# Patient Record
Sex: Female | Born: 1945 | Race: White | Hispanic: No | Smoking: Former smoker
Health system: Southern US, Community
[De-identification: ages and names within clinical notes are randomized; demographics above are authoritative.]

## PROBLEM LIST (undated history)

## (undated) DIAGNOSIS — H269 Unspecified cataract: Secondary | ICD-10-CM

## (undated) DIAGNOSIS — M8580 Other specified disorders of bone density and structure, unspecified site: Secondary | ICD-10-CM

## (undated) DIAGNOSIS — N200 Calculus of kidney: Secondary | ICD-10-CM

## (undated) DIAGNOSIS — L309 Dermatitis, unspecified: Secondary | ICD-10-CM

## (undated) DIAGNOSIS — K115 Sialolithiasis: Secondary | ICD-10-CM

## (undated) DIAGNOSIS — C4490 Unspecified malignant neoplasm of skin, unspecified: Secondary | ICD-10-CM

## (undated) DIAGNOSIS — T7840XA Allergy, unspecified, initial encounter: Secondary | ICD-10-CM

## (undated) DIAGNOSIS — M1990 Unspecified osteoarthritis, unspecified site: Secondary | ICD-10-CM

## (undated) DIAGNOSIS — L300 Nummular dermatitis: Secondary | ICD-10-CM

## (undated) DIAGNOSIS — N8110 Cystocele, unspecified: Secondary | ICD-10-CM

## (undated) DIAGNOSIS — J40 Bronchitis, not specified as acute or chronic: Secondary | ICD-10-CM

## (undated) DIAGNOSIS — Z973 Presence of spectacles and contact lenses: Secondary | ICD-10-CM

## (undated) DIAGNOSIS — R7989 Other specified abnormal findings of blood chemistry: Secondary | ICD-10-CM

## (undated) DIAGNOSIS — Z0000 Encounter for general adult medical examination without abnormal findings: Secondary | ICD-10-CM

## (undated) DIAGNOSIS — J449 Chronic obstructive pulmonary disease, unspecified: Secondary | ICD-10-CM

## (undated) DIAGNOSIS — E559 Vitamin D deficiency, unspecified: Secondary | ICD-10-CM

## (undated) DIAGNOSIS — H4010X0 Unspecified open-angle glaucoma, stage unspecified: Secondary | ICD-10-CM

## (undated) DIAGNOSIS — I10 Essential (primary) hypertension: Secondary | ICD-10-CM

## (undated) DIAGNOSIS — Z8619 Personal history of other infectious and parasitic diseases: Secondary | ICD-10-CM

## (undated) DIAGNOSIS — H409 Unspecified glaucoma: Secondary | ICD-10-CM

## (undated) DIAGNOSIS — B958 Unspecified staphylococcus as the cause of diseases classified elsewhere: Secondary | ICD-10-CM

## (undated) DIAGNOSIS — R32 Unspecified urinary incontinence: Secondary | ICD-10-CM

## (undated) DIAGNOSIS — E7800 Pure hypercholesterolemia, unspecified: Secondary | ICD-10-CM

## (undated) HISTORY — DX: Vitamin D deficiency, unspecified: E55.9

## (undated) HISTORY — DX: Sialolithiasis: K11.5

## (undated) HISTORY — DX: Unspecified urinary incontinence: R32

## (undated) HISTORY — DX: Hypercalcemia: E83.52

## (undated) HISTORY — PX: REFRACTIVE SURGERY: SHX103

## (undated) HISTORY — DX: Other specified disorders of bone density and structure, unspecified site: M85.80

## (undated) HISTORY — DX: Encounter for general adult medical examination without abnormal findings: Z00.00

## (undated) HISTORY — DX: Unspecified malignant neoplasm of skin, unspecified: C44.90

## (undated) HISTORY — DX: Unspecified open-angle glaucoma, stage unspecified: H40.10X0

## (undated) HISTORY — DX: Nummular dermatitis: L30.0

## (undated) HISTORY — DX: Other specified abnormal findings of blood chemistry: R79.89

## (undated) HISTORY — DX: Personal history of other infectious and parasitic diseases: Z86.19

## (undated) HISTORY — PX: CATARACT EXTRACTION: SUR2

## (undated) HISTORY — DX: Unspecified staphylococcus as the cause of diseases classified elsewhere: B95.8

## (undated) HISTORY — DX: Cystocele, unspecified: N81.10

## (undated) HISTORY — DX: Unspecified cataract: H26.9

## (undated) HISTORY — DX: Chronic obstructive pulmonary disease, unspecified: J44.9

## (undated) HISTORY — PX: EYE SURGERY: SHX253

## (undated) HISTORY — DX: Unspecified glaucoma: H40.9

## (undated) HISTORY — DX: Allergy, unspecified, initial encounter: T78.40XA

---
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
## 1975-01-24 HISTORY — PX: TUBAL LIGATION: SHX77

## 1998-02-04 ENCOUNTER — Encounter: Payer: Self-pay | Admitting: Internal Medicine

## 2005-10-23 ENCOUNTER — Other Ambulatory Visit: Admission: RE | Admit: 2005-10-23 | Discharge: 2005-10-23 | Payer: Self-pay | Admitting: Family Medicine

## 2005-11-29 ENCOUNTER — Encounter: Admission: RE | Admit: 2005-11-29 | Discharge: 2005-11-29 | Payer: Self-pay | Admitting: Family Medicine

## 2006-02-28 ENCOUNTER — Emergency Department (HOSPITAL_COMMUNITY): Admission: EM | Admit: 2006-02-28 | Discharge: 2006-02-28 | Payer: Self-pay | Admitting: Emergency Medicine

## 2006-12-12 ENCOUNTER — Encounter: Admission: RE | Admit: 2006-12-12 | Discharge: 2006-12-12 | Payer: Self-pay | Admitting: Internal Medicine

## 2007-12-13 ENCOUNTER — Encounter: Admission: RE | Admit: 2007-12-13 | Discharge: 2007-12-13 | Payer: Self-pay | Admitting: Internal Medicine

## 2008-03-02 ENCOUNTER — Encounter: Payer: Self-pay | Admitting: Internal Medicine

## 2008-03-19 ENCOUNTER — Encounter: Payer: Self-pay | Admitting: Internal Medicine

## 2008-05-07 DIAGNOSIS — I10 Essential (primary) hypertension: Secondary | ICD-10-CM | POA: Insufficient documentation

## 2008-05-07 DIAGNOSIS — M810 Age-related osteoporosis without current pathological fracture: Secondary | ICD-10-CM | POA: Insufficient documentation

## 2008-05-07 DIAGNOSIS — E559 Vitamin D deficiency, unspecified: Secondary | ICD-10-CM | POA: Insufficient documentation

## 2008-05-11 ENCOUNTER — Ambulatory Visit: Payer: Self-pay | Admitting: Internal Medicine

## 2008-05-11 DIAGNOSIS — K5900 Constipation, unspecified: Secondary | ICD-10-CM | POA: Insufficient documentation

## 2008-06-26 ENCOUNTER — Ambulatory Visit: Payer: Self-pay | Admitting: Internal Medicine

## 2008-12-14 ENCOUNTER — Encounter: Admission: RE | Admit: 2008-12-14 | Discharge: 2008-12-14 | Payer: Self-pay | Admitting: Internal Medicine

## 2010-01-18 ENCOUNTER — Encounter
Admission: RE | Admit: 2010-01-18 | Discharge: 2010-01-18 | Payer: Self-pay | Source: Home / Self Care | Attending: Internal Medicine | Admitting: Internal Medicine

## 2010-02-22 NOTE — Assessment & Plan Note (Signed)
Summary: SCREEN FOR COLON/YF   History of Present Illness Visit Type: Initial Consult Primary GI MD: Lina Sar MD Primary Provider: Buren Kos, MD Requesting Provider: Buren Kos, MD Chief Complaint: consult colon, pt has them every 5 years b/c of family hx of colon cancer History of Present Illness:   This is a 65 year old white female here to discuss having a screening colonoscopy. She has a history of colon polyps and a family history of colon cancer in her mother who was diagnosed at the age of 28. Mrs. Chilton Si had 2 prior colonoscopies in 06-24-98 and 2003/06/24 in [place], [place] by a gastroenterologist. She did not have any sedation for either of the procedures per her request. We do not have those reports but we have requested them. This year would be 5 years since her last colonoscopy. The only symptom she has is constipation. Patient takes MiraLax p.r.n. and stays on a high fiber diet. She denies any rectal bleeding or abdominal pain. Her weight has been stable. Additional medical problems include high blood pressure, osteopenia and vitamin D deficiency. She also is an ex-smoker.   GI Review of Systems      Denies abdominal pain, acid reflux, belching, bloating, chest pain, dysphagia with liquids, dysphagia with solids, heartburn, loss of appetite, nausea, vomiting, vomiting blood, weight loss, and  weight gain.        Denies anal fissure, black tarry stools, change in bowel habit, constipation, diarrhea, diverticulosis, fecal incontinence, heme positive stool, hemorrhoids, irritable bowel syndrome, jaundice, light color stool, liver problems, rectal bleeding, and  rectal pain.    Current Medications (verified): 1)  Magic Mouthwash .... Swish and Swallow Four Times A Day 2)  Lisinopril 20 Mg Tabs (Lisinopril) .... One Tablet By Mouth Once Daily 3)  Calcium 600 1500 Mg Tabs (Calcium Carbonate) .... 2 Tablets By Mouth Once Daily 4)  Vitamin D 1000 Unit Caps (Cholecalciferol) ....  One Tablet By Mouth Once Daily 5)  Multivitamins   Tabs (Multiple Vitamin) .... One Tablet By Mouth Once Daily 6)  Aspirin 81 Mg  Tabs (Aspirin) .... One Tablet By Mouth Once Daily  Allergies (verified): 1)  ! Penicillin 2)  ! Sulfa 3)  ! Steroids  Past History:  Past Medical History:    Reviewed history from 05/07/2008 and no changes required:    Current Problems:     VITAMIN D DEFICIENCY (ICD-268.9)    OSTEOPENIA (ICD-733.90)    HYPERTENSION (ICD-401.9)  Past Surgical History:    Unremarkable  Family History:    Family History of Colon Cancer:Mother died in 1999-06-24.  Social History:    Retired    Patient currently smokes.     Illicit Drug Use - no    Alcohol Use - no    Daily Caffeine Use  Review of Systems  The patient denies allergy/sinus, anemia, anxiety-new, arthritis/joint pain, back pain, blood in urine, breast changes/lumps, change in vision, confusion, cough, coughing up blood, depression-new, fainting, fatigue, fever, headaches-new, hearing problems, heart murmur, heart rhythm changes, itching, menstrual pain, muscle pains/cramps, night sweats, nosebleeds, pregnancy symptoms, shortness of breath, skin rash, sleeping problems, sore throat, swelling of feet/legs, swollen lymph glands, thirst - excessive , urination - excessive , urination changes/pain, urine leakage, vision changes, and voice change.    Vital Signs:  Patient profile:   65 year old female Height:      67 inches Weight:      136 pounds BMI:     21.38 Pulse rate:  80 / minute Pulse rhythm:   regular BP sitting:   144 / 82  (right arm) Cuff size:   regular  Vitals Entered By: Christie Nottingham CMA (May 11, 2008 10:55 AM)  Physical Exam  General:  alert, oriented and very cooperative. Appeared healthy for her age. Neck:  Supple; no masses or thyromegaly. Lungs:  Clear throughout to auscultation. Heart:  Regular rate and rhythm; no murmurs, rubs or bruits. Abdomen:  soft scaphoid abdomen with  normoactive bowel sounds. Stretch marks. Liver edge at costal margin. No distention. Rectal:  deferred. Extremities:  No clubbing, cyanosis, edema or deformities noted. Neurologic:  Alert and  oriented x4;  grossly normal neurologically.   Impression & Recommendations:  Problem # 1:  ADENOCARCINOMA, COLON, FAMILY HX (ICD-V16.0)  family history colon cancer in patient's mother. 2 prior colonoscopies done in 2000 and 2005 showed small colon polyps. We have requested reports from [place], [place]. Patient is due to have another colonoscopy as per guidelines of the American Cancer Society. We will set up exam without conscious sedation per her request. I have discussed with the patient pros and cons of conscious sedation and she insists on not  needing any sedation for the procedure.  Orders: Colonoscopy (Colon)  Problem # 2:  CONSTIPATION (ICD-564.00)  chronic constipation. Patient is using MiraLax intermittently. She should continue a high-fiber diet.  Orders: Colonoscopy (Colon)  Patient Instructions: 1)  schedule surveillance colonoscopy. 2)  Obtain reports from [place], [place] 3)  High-fiber diet. 4)  Copy sent to : Dr D.Clelia Croft Prescriptions: DULCOLAX 5 MG  TBEC (BISACODYL) Day before procedure take 2 at 3pm and 2 at 8pm.  #4 x 0   Entered by:   Hortense Ramal CMA   Authorized by:   Hart Carwin   Signed by:   Hortense Ramal CMA on 05/11/2008   Method used:   Electronically to        [facility] (retail)       [address]       [place], [place]  [address]       Ph: [phone]       Fax: [phone]   RxID:   [id number] REGLAN 10 MG  TABS (METOCLOPRAMIDE HCL) As per prep instructions.  #2 x 0   Entered by:   Hortense Ramal CMA   Authorized by:   Hart Carwin   Signed by:   Hortense Ramal CMA on 05/11/2008   Method used:   Electronically to        [facility] ([facility])       [address]        [place], [place]  [address]       Ph: [phone]       Fax: [phone]   RxID:   [id number] MIRALAX   POWD (POLYETHYLENE GLYCOL 3350) As per prep  instructions.  #255gm x 0   Entered by:   Hortense Ramal CMA   Authorized by:   Hart Carwin   Signed by:   Hortense Ramal CMA on 05/11/2008   Method used:   Electronically to        [facility]       [address]       [place], [place]  [address]       Ph:  [phone]       Fax: [phone]   RxID:   [id number]   Appended Document: SCREEN FOR COLON/YF I have received a colonoscopy report from Dr Kathie Rhodes.Sherryll Burger from [facility] ,[place] , [place], 1/13/200, which shows  normal exam to TI, except hemorrhoids.I left a messsage on pt's phone about receiving the records

## 2010-02-22 NOTE — Procedures (Signed)
Summary: [facility], [place], [place]  Colonoscopy/[facility], [place], [place]   Imported By: Lanelle Bal 06/05/2008 13:46:46  _____________________________________________________________________  External Attachment:    Type:   Image     Comment:   External Document

## 2010-02-22 NOTE — Procedures (Signed)
Summary: Colonoscopy   Colonoscopy  Procedure date:  06/26/2008  Findings:      Location:  [facility].    Procedures Next Due Date:    Colonoscopy: 06/2013  COLONOSCOPY PROCEDURE REPORT  PATIENT:  Suzanne Turner, Suzanne Turner  MR#:  [id number] BIRTHDATE:   01-03-1946, 63 yrs. old   GENDER:   female  ENDOSCOPIST:   Hedwig Morton. Juanda Chance, MD Referred by: Kari Baars, M.D.  PROCEDURE DATE:  06/26/2008 PROCEDURE:  Surveillance Colonoscopy ASA CLASS:   Class I INDICATIONS: family history of colon cancer colon cancer in pt's mother  colonoscopy in 2000 and 2005 in [place] without sedation  polyps on last colonoscopy  constipation  MEDICATIONS:    none  DESCRIPTION OF PROCEDURE:   After the risks benefits and alternatives of the procedure were thoroughly explained, informed consent was obtained.  Digital rectal exam was performed and revealed no rectal masses.   The LB [id number]-[id number] endoscope was introduced through the anus and advanced to the cecum, which was identified by both the appendix and ileocecal valve, without limitations.  The quality of the prep was good, using MiraLax.  The instrument was then slowly withdrawn as the colon was fully examined. <<PROCEDUREIMAGES>>        <<OLD IMAGES>>  FINDINGS:  normal colon otherwise (see image1, image2, image3, and image4).  No polyps or cancers were seen.   Retroflexed views in the rectum revealed no abnormalities.    The scope was then withdrawn from the patient and the procedure completed.  COMPLICATIONS:   None  ENDOSCOPIC IMPRESSION:  1) Normal  2) No polyps or cancers RECOMMENDATIONS:  1) high fiber diet  REPEAT EXAM:   In 5 year(s) for.   _______________________________ Hedwig Morton. Juanda Chance, MD  cc: Buren Kos, MD

## 2010-12-19 ENCOUNTER — Other Ambulatory Visit: Payer: Self-pay | Admitting: Internal Medicine

## 2010-12-19 DIAGNOSIS — Z1231 Encounter for screening mammogram for malignant neoplasm of breast: Secondary | ICD-10-CM

## 2011-01-12 ENCOUNTER — Emergency Department (HOSPITAL_COMMUNITY)
Admission: EM | Admit: 2011-01-12 | Discharge: 2011-01-13 | Disposition: A | Discharge status: Home / Self Care | Payer: Medicare Other | Attending: Emergency Medicine | Admitting: Emergency Medicine

## 2011-01-12 ENCOUNTER — Other Ambulatory Visit: Payer: Self-pay

## 2011-01-12 DIAGNOSIS — E7800 Pure hypercholesterolemia, unspecified: Secondary | ICD-10-CM | POA: Insufficient documentation

## 2011-01-12 DIAGNOSIS — Z7982 Long term (current) use of aspirin: Secondary | ICD-10-CM | POA: Insufficient documentation

## 2011-01-12 DIAGNOSIS — E876 Hypokalemia: Secondary | ICD-10-CM | POA: Insufficient documentation

## 2011-01-12 DIAGNOSIS — F17200 Nicotine dependence, unspecified, uncomplicated: Secondary | ICD-10-CM | POA: Insufficient documentation

## 2011-01-12 DIAGNOSIS — R0602 Shortness of breath: Secondary | ICD-10-CM | POA: Insufficient documentation

## 2011-01-12 DIAGNOSIS — Z79899 Other long term (current) drug therapy: Secondary | ICD-10-CM | POA: Insufficient documentation

## 2011-01-12 DIAGNOSIS — R002 Palpitations: Secondary | ICD-10-CM

## 2011-01-12 DIAGNOSIS — I10 Essential (primary) hypertension: Secondary | ICD-10-CM | POA: Insufficient documentation

## 2011-01-12 DIAGNOSIS — M1990 Unspecified osteoarthritis, unspecified site: Secondary | ICD-10-CM | POA: Insufficient documentation

## 2011-01-12 HISTORY — DX: Essential (primary) hypertension: I10

## 2011-01-12 HISTORY — DX: Unspecified osteoarthritis, unspecified site: M19.90

## 2011-01-12 HISTORY — DX: Bronchitis, not specified as acute or chronic: J40

## 2011-01-12 HISTORY — DX: Calculus of kidney: N20.0

## 2011-01-12 HISTORY — DX: Pure hypercholesterolemia, unspecified: E78.00

## 2011-01-12 LAB — POCT I-STAT, CHEM 8
BUN: 11 mg/dL (ref 6–23)
Calcium, Ion: 1.17 mmol/L (ref 1.12–1.32)
Chloride: 106 mEq/L (ref 96–112)
Creatinine, Ser: 0.8 mg/dL (ref 0.50–1.10)
Glucose, Bld: 147 mg/dL — ABNORMAL HIGH (ref 70–99)
HCT: 40 % (ref 36.0–46.0)
Hemoglobin: 13.6 g/dL (ref 12.0–15.0)
Potassium: 2.8 mEq/L — ABNORMAL LOW (ref 3.5–5.1)
Sodium: 143 mEq/L (ref 135–145)
TCO2: 24 mmol/L (ref 0–100)

## 2011-01-12 NOTE — ED Notes (Signed)
Pt c/o elevated high bp and anxiety x2 days

## 2011-01-13 ENCOUNTER — Other Ambulatory Visit: Payer: Self-pay

## 2011-01-13 ENCOUNTER — Encounter (HOSPITAL_COMMUNITY): Payer: Self-pay

## 2011-01-13 LAB — POCT I-STAT TROPONIN I
Troponin i, poc: 0 ng/mL (ref 0.00–0.08)
Troponin i, poc: 0 ng/mL (ref 0.00–0.08)

## 2011-01-13 MED ORDER — POTASSIUM CHLORIDE CRYS ER 20 MEQ PO TBCR
40.0000 meq | EXTENDED_RELEASE_TABLET | Freq: Once | ORAL | Status: AC
  Administered 2011-01-13: 40 meq via ORAL
  Filled 2011-01-13: qty 2

## 2011-01-13 MED ORDER — POTASSIUM CHLORIDE 10 MEQ/100ML IV SOLN
10.0000 meq | Freq: Once | INTRAVENOUS | Status: AC
  Administered 2011-01-13: 10 meq via INTRAVENOUS
  Filled 2011-01-13: qty 100

## 2011-01-13 MED ORDER — SODIUM CHLORIDE 0.9 % IV SOLN
Freq: Once | INTRAVENOUS | Status: AC
  Administered 2011-01-13: 500 mL via INTRAVENOUS

## 2011-01-13 NOTE — ED Provider Notes (Signed)
History     CSN: [id number]  Arrival date & time 01/12/11  2228   First MD Initiated Contact with Patient 01/13/11 0051     Chief Complaint  Patient presents with  . Anxiety  . Hypertension     Patient is a 65 y.o. female presenting with palpitations.  Palpitations  This is a new problem. The current episode started 6 to 12 hours ago. The problem occurs constantly. The problem has been rapidly improving. The problem is associated with an unknown factor. Episode Length: several hours. Associated symptoms include shortness of breath. Pertinent negatives include no diaphoresis, no fever, no chest pain, no chest pressure, no exertional chest pressure, no near-syncope, no syncope, no nausea and no vomiting.  Pt reports that earlier tonight, she felt her heart racing, she felt her that her BP was elevated.  She denies syncope/cp.  This lasted for several hrs.  She also admits to feeling anxious.  She now feels improved   She reports mild SOB No diaphoresis No new meds No increase in caffeine She is a smoker Denies h/o CAD/CVA No vomiting reported No focal weakness No HA No vision changes  Past Medical History  Diagnosis Date  . Hypertension   . High cholesterol   . Osteoarthritis   . Kidney stone   . Bronchitis     Past Surgical History  Procedure Date  . Tubal ligation     History reviewed. No pertinent family history.  History  Substance Use Topics  . Smoking status: Current Everyday Smoker -- 0.5 packs/day  . Smokeless tobacco: Not on file  . Alcohol Use: No    OB History    Grav Para Term Preterm Abortions TAB SAB Ect Mult Living                  Review of Systems  Constitutional: Negative for fever and diaphoresis.  Respiratory: Positive for shortness of breath.   Cardiovascular: Positive for palpitations. Negative for chest pain, syncope and near-syncope.  Gastrointestinal: Negative for nausea and vomiting.  All other systems reviewed and are  negative.    Allergies  Medrol; Penicillins; and Sulfonamide derivatives  Home Medications   Current Outpatient Rx  Name Route Sig Dispense Refill  . CALCIUM CARBONATE 1250 MG PO TABS Oral Take 1 tablet by mouth daily.      Marland Kitchen VITAMIN D 1000 UNITS PO TABS Oral Take 1,000 Units by mouth daily.      Marland Kitchen LISINOPRIL 20 MG PO TABS Oral Take 20 mg by mouth daily.      Marland Kitchen MELATONIN 5 MG PO TABS Oral Take 1 tablet by mouth at bedtime.      . ASPIRIN EC 81 MG PO TBEC Oral Take 81 mg by mouth daily.        BP 180/100  Pulse 105  Temp(Src) 97.8 F (36.6 C) (Oral)  Resp 16  SpO2 100%  Physical Exam  CONSTITUTIONAL: Well developed/well nourished HEAD AND FACE: Normocephalic/atraumatic EYES: EOMI/PERRL ENMT: Mucous membranes moist NECK: supple no meningeal signs SPINE:entire spine nontender CV: S1/S2 noted, no murmurs/rubs/gallops noted LUNGS: Lungs are clear to auscultation bilaterally, no apparent distress ABDOMEN: soft, nontender, no rebound or guarding GU:no cva tenderness NEURO: Pt is awake/alert, moves all extremitiesx4 No arm drift is noted EXTREMITIES: pulses normal, full ROM SKIN: warm, color normal PSYCH: no abnormalities of mood noted   ED Course  Procedures   Labs Reviewed  POCT I-STAT, CHEM 8 - Abnormal; Notable for the following:  Potassium 2.8 (*)    Glucose, Bld 147 (*)    All other components within normal limits  POCT I-STAT TROPONIN I  I-STAT, CHEM 8  I-STAT TROPONIN I  I-STAT TROPONIN I   2:24 AM Pt improved No CP reported Will check two cardiac markers but my suspicion for ACS is low . Doubt PE/Dissection Noted to be hypokalemic, which could contribute to these symptoms    MDM  Nursing notes reviewed and considered in documentation All labs/vitals reviewed and considered      Date: 01/13/2011  Rate: 85  Rhythm: normal sinus rhythm  QRS Axis: normal  Intervals: normal  ST/T Wave abnormalities: nonspecific ST changes  Conduction  Disutrbances:none  Narrative Interpretation:   Old EKG Reviewed: none available    Joya Gaskins, MD 01/13/11 0730

## 2011-01-13 NOTE — ED Notes (Signed)
Pt ambulated to bathroom independently. Pt did not complain of any dizziness, sob, or rapid heart rate.

## 2011-01-13 NOTE — ED Notes (Signed)
Pt c/o heart palpitations, sob, and elevated bp. Pt has been taking Lisinopril 10 mg daily x2 yrs, pcp increased pts Lisinopril to 20 mg daily 3 wks ago, pt sts "i haven't felt right since they changed my medication." NAD, pt placed on monitor by Joy NT, will continue to observe

## 2011-01-13 NOTE — ED Notes (Signed)
Delphina Cahill pt's sister [phone] (home) [phone] (cell)

## 2011-01-20 ENCOUNTER — Institutional Professional Consult (permissible substitution): Payer: Self-pay | Admitting: Cardiology

## 2011-01-26 ENCOUNTER — Ambulatory Visit
Admission: RE | Admit: 2011-01-26 | Discharge: 2011-01-26 | Disposition: A | Discharge status: Home / Self Care | Payer: Medicare Other | Source: Ambulatory Visit | Attending: Internal Medicine | Admitting: Internal Medicine

## 2011-01-26 DIAGNOSIS — Z1231 Encounter for screening mammogram for malignant neoplasm of breast: Secondary | ICD-10-CM | POA: Diagnosis not present

## 2011-01-31 ENCOUNTER — Encounter (HOSPITAL_COMMUNITY): Payer: Self-pay | Admitting: Emergency Medicine

## 2011-01-31 ENCOUNTER — Emergency Department (HOSPITAL_COMMUNITY): Payer: Medicare Other

## 2011-01-31 ENCOUNTER — Observation Stay (HOSPITAL_COMMUNITY)
Admission: EM | Admit: 2011-01-31 | Discharge: 2011-02-01 | Disposition: A | Discharge status: Home / Self Care | Payer: Medicare Other | Attending: Emergency Medicine | Admitting: Emergency Medicine

## 2011-01-31 DIAGNOSIS — R079 Chest pain, unspecified: Secondary | ICD-10-CM | POA: Diagnosis not present

## 2011-01-31 DIAGNOSIS — I6789 Other cerebrovascular disease: Secondary | ICD-10-CM | POA: Diagnosis not present

## 2011-01-31 DIAGNOSIS — I10 Essential (primary) hypertension: Secondary | ICD-10-CM | POA: Insufficient documentation

## 2011-01-31 DIAGNOSIS — R209 Unspecified disturbances of skin sensation: Secondary | ICD-10-CM | POA: Diagnosis not present

## 2011-01-31 DIAGNOSIS — J984 Other disorders of lung: Secondary | ICD-10-CM | POA: Diagnosis not present

## 2011-01-31 LAB — CBC
HCT: 36.3 % (ref 36.0–46.0)
Hemoglobin: 12.4 g/dL (ref 12.0–15.0)
MCH: 31.2 pg (ref 26.0–34.0)
MCHC: 34.2 g/dL (ref 30.0–36.0)
MCV: 91.2 fL (ref 78.0–100.0)
Platelets: 198 10*3/uL (ref 150–400)
RBC: 3.98 MIL/uL (ref 3.87–5.11)
RDW: 13.1 % (ref 11.5–15.5)
WBC: 5.1 10*3/uL (ref 4.0–10.5)

## 2011-01-31 LAB — BASIC METABOLIC PANEL
BUN: 14 mg/dL (ref 6–23)
CO2: 25 mEq/L (ref 19–32)
Calcium: 10.3 mg/dL (ref 8.4–10.5)
Chloride: 101 mEq/L (ref 96–112)
Creatinine, Ser: 0.71 mg/dL (ref 0.50–1.10)
GFR calc Af Amer: >90 mL/min (ref >90)
GFR calc non Af Amer: 89 mL/min — ABNORMAL LOW (ref >90)
Glucose, Bld: 93 mg/dL (ref 70–99)
Potassium: 3.6 mEq/L (ref 3.5–5.1)
Sodium: 137 mEq/L (ref 135–145)

## 2011-01-31 LAB — TROPONIN I
Troponin I: <0.3 ng/mL (ref <0.30)
Troponin I: <0.3 ng/mL (ref <0.30)

## 2011-01-31 LAB — POCT I-STAT TROPONIN I: Troponin i, poc: 0 ng/mL (ref 0.00–0.08)

## 2011-01-31 MED ORDER — LORAZEPAM 1 MG PO TABS
1.0000 mg | ORAL_TABLET | Freq: Every day | ORAL | Status: DC
  Administered 2011-01-31: 1 mg via ORAL
  Filled 2011-01-31: qty 1

## 2011-01-31 NOTE — ED Notes (Signed)
Pt complained of increased palpations and lt arm numbness. HR was in 90s. EDP made aware. No new orders given.

## 2011-01-31 NOTE — ED Provider Notes (Signed)
6:27 PM Patient in CDU under chest pain protocol.  Patient with several week history of palpitations and poorly controlled hypertension reports to ED today for evaluation of chest pain.  Patient currently chest pain free.  12 lead without indication of ischemia.  Initial cardiac marker negative.  Monitor reveals NSR with ectopy.  S1/S2, RRR, no murmur.  Lungs CTA bilaterally.  Abdomen soft, bowel sounds present.  Strong distal pulses palpated all extremities.  Treatment and diagnostic plan discussed with patient.  Patient scheduled for echo stress test in AM. 7:11 PM  Date: 01/31/2011  Rate:65  Rhythm: normal sinus rhythm  QRS Axis: normal  Intervals: normal  ST/T Wave abnormalities: normal  Conduction Disutrbances:none  Narrative Interpretation: NSR without ectopy  Old EKG Reviewed: unchanged  11:58 PM Patient signed out to Dr. Radford Pax.  Jimmye Norman, NP 01/31/11 [phone]

## 2011-01-31 NOTE — ED Notes (Signed)
Pt stated that she has been having increased lt arm numbness starting today. She also stated that she has been having intermit

## 2011-01-31 NOTE — ED Notes (Signed)
Gave Report to M[place]hon. Pt moved to CDU.

## 2011-01-31 NOTE — ED Notes (Signed)
Pt to cdu for chest pain protocol. Pt to have stress test in am. C/o intermittent chest and back pain with left arm numbness since December. Pt seen at [facility] ed recently for palpitations. Pt has appt with dr Shirlee Latch this month for eval. Pt denies sx right now. Appears anxious, will cont to monitor.

## 2011-01-31 NOTE — ED Notes (Signed)
Per EMS: pt from home c/o left arm numbness with some intermittent chest and back pain; pt sts left arm numbness new today; pt given ASA and nitro in route without effect; pt anxious at present

## 2011-01-31 NOTE — ED Notes (Signed)
Pt stated that she has been having intermittent left arm numbness starting today. She is also complaining of intermittent palpations x 2 weeks. She is currently not having any chest pain. She is not in any respiratory distress. She does complain of dizziness with the palpations. Heart sounds WNL. Will continue to monitor.

## 2011-02-01 DIAGNOSIS — R072 Precordial pain: Secondary | ICD-10-CM | POA: Diagnosis not present

## 2011-02-01 NOTE — ED Provider Notes (Signed)
I saw and evaluated the patient, reviewed the resident's note and I agree with the findings and plan.   Arihana Ambrocio A. Tajia Szeliga, MD 02/01/11 1453 

## 2011-02-01 NOTE — ED Notes (Signed)
Awaiting test results. No voiced complaints presently. NAD. Remains NSR on monitor

## 2011-02-01 NOTE — Progress Notes (Signed)
Observation review complete. 

## 2011-02-01 NOTE — ED Notes (Signed)
Returned from stress echo. Tolerated well. Denies CP, SOB. NAD. Informed patient and/or family of status. Awaiting test results.

## 2011-02-01 NOTE — ED Notes (Signed)
Transported to [facility] for stress test via w/c. Remains pain free

## 2011-02-01 NOTE — ED Notes (Signed)
Happy meal given. No voiced complaints presently. NAD.

## 2011-02-01 NOTE — ED Notes (Signed)
Report received, assumed care. Pt ambulatory to bathroom without assistance

## 2011-02-01 NOTE — ED Provider Notes (Cosign Needed)
11:54 AM I received a call from cardiology.  Stress echo is normal.   Alerted Dr Ignacia Palma to result as he is taking care of patient.   Dillard Cannon [place], [place] 02/01/11 1217

## 2011-02-01 NOTE — ED Provider Notes (Cosign Needed)
History     CSN: [id number]  Arrival date & time 01/31/11  1508   First MD Initiated Contact with Patient 01/31/11 [phone]      Chief Complaint  Patient presents with  . Numbness    (Consider location/radiation/quality/duration/timing/severity/associated sxs/prior treatment) HPI Comments: Pt is a 66 year old woman placed on chest pain protocol last night.  She has had negative EKG and 3 sets of cardiac markers.  She is awaiting stress ECHO.  She has an appointment to be seen by Marca Ancona, M.D., of [facility], on January 28, and she hopes to have this appointment moved up.   Patient is a 66 y.o. female presenting with chest pain. The history is provided by the nursing home and the patient. No language interpreter was used.  Chest Pain     Past Medical History  Diagnosis Date  . Hypertension   . High cholesterol   . Osteoarthritis   . Kidney stone   . Bronchitis     Past Surgical History  Procedure Date  . Tubal ligation     History reviewed. No pertinent family history.  History  Substance Use Topics  . Smoking status: Current Everyday Smoker -- 0.5 packs/day  . Smokeless tobacco: Not on file  . Alcohol Use: No    OB History    Grav Para Term Preterm Abortions TAB SAB Ect Mult Living                  Review of Systems  Cardiovascular: Positive for chest pain.  All other systems reviewed and are negative.    Allergies  Medrol; Penicillins; and Sulfonamide derivatives  Home Medications   Current Outpatient Rx  Name Route Sig Dispense Refill  . ASPIRIN EC 81 MG PO TBEC Oral Take 81 mg by mouth daily.      Marland Kitchen CALCIUM CARBONATE 1250 MG PO TABS Oral Take 1 tablet by mouth daily.      Marland Kitchen VITAMIN D 1000 UNITS PO TABS Oral Take 1,000 Units by mouth daily.      Marland Kitchen LISINOPRIL 20 MG PO TABS Oral Take 20 mg by mouth daily.      Marland Kitchen MELATONIN 5 MG PO TABS Oral Take 1 tablet by mouth at bedtime.        BP 130/78  Pulse 88  Temp(Src) 97.8 F (36.6 C)  (Oral)  Resp 20  SpO2 97%  Physical Exam  Constitutional: She is oriented to person, place, and time.       Slender elderly woman in no distress.  HENT:  Head: Normocephalic and atraumatic.  Eyes: EOM are normal.  Neck: Normal range of motion. Neck supple.  Cardiovascular: Normal rate, regular rhythm and normal heart sounds.   Pulmonary/Chest: Effort normal and breath sounds normal.  Abdominal: Soft. There is no tenderness.  Musculoskeletal: Normal range of motion.  Neurological: She is alert and oriented to person, place, and time.       No sensory or motor deficit.  Skin: Skin is warm and dry.  Psychiatric: She has a normal mood and affect. Her behavior is normal.    ED Course  Procedures (including critical care time)  Labs Reviewed  BASIC METABOLIC PANEL - Abnormal; Notable for the following:    GFR calc non Af Amer 89 (*)    All other components within normal limits  CBC  POCT I-STAT TROPONIN I  TROPONIN I  TROPONIN I  I-STAT TROPONIN I   Dg Chest 2 View  01/31/2011  *RADIOLOGY REPORT*  Clinical Data: Chest pain.  Numbness left arm  CHEST - 2 VIEW  Comparison: 02/28/2006  Findings: Heart size is normal.  Vascularity is normal.  Negative for pneumonia or effusion.  COPD with hyperinflation.  Apical pleural scarring bilaterally.  IMPRESSION: No acute cardiopulmonary disease.  Original Report Authenticated By: Camelia Phenes, M.D.   Results for orders placed during the hospital encounter of 01/31/11  CBC      Component Value Range   WBC 5.1  4.0 - 10.5 (K/uL)   RBC 3.98  3.87 - 5.11 (MIL/uL)   Hemoglobin 12.4  12.0 - 15.0 (g/dL)   HCT 96.0  45.4 - 09.8 (%)   MCV 91.2  78.0 - 100.0 (fL)   MCH 31.2  26.0 - 34.0 (pg)   MCHC 34.2  30.0 - 36.0 (g/dL)   RDW 11.9  14.7 - 82.9 (%)   Platelets 198  150 - 400 (K/uL)  BASIC METABOLIC PANEL      Component Value Range   Sodium 137  135 - 145 (mEq/L)   Potassium 3.6  3.5 - 5.1 (mEq/L)   Chloride 101  96 - 112 (mEq/L)   CO2 25   19 - 32 (mEq/L)   Glucose, Bld 93  70 - 99 (mg/dL)   BUN 14  6 - 23 (mg/dL)   Creatinine, Ser 5.62  0.50 - 1.10 (mg/dL)   Calcium 13.0  8.4 - 10.5 (mg/dL)   GFR calc non Af Amer 89 (*) >90 (mL/min)   GFR calc Af Amer >90  >90 (mL/min)  POCT I-STAT TROPONIN I      Component Value Range   Troponin i, poc 0.00  0.00 - 0.08 (ng/mL)   Comment 3           TROPONIN I      Component Value Range   Troponin I <0.30  <0.30 (ng/mL)  TROPONIN I      Component Value Range   Troponin I <0.30  <0.30 (ng/mL)   Dg Chest 2 View  01/31/2011  *RADIOLOGY REPORT*  Clinical Data: Chest pain.  Numbness left arm  CHEST - 2 VIEW  Comparison: 02/28/2006  Findings: Heart size is normal.  Vascularity is normal.  Negative for pneumonia or effusion.  COPD with hyperinflation.  Apical pleural scarring bilaterally.  IMPRESSION: No acute cardiopulmonary disease.  Original Report Authenticated By: Camelia Phenes, M.D.   Mm Digital Screening  01/27/2011  DG SCREEN MAMMOGRAM BILATERAL Bilateral CC and MLO view(s) were taken.  DIGITAL SCREENING MAMMOGRAM WITH CAD: The breast tissue is almost entirely fatty.  No masses or malignant type calcifications are  identified.  Compared with prior studies.  Images were processed with CAD.  IMPRESSION: No specific mammographic evidence of malignancy.  Next screening mammogram is recommended in one  year.  A result letter of this screening mammogram will be mailed directly to the patient.  ASSESSMENT: Negative - BI-RADS 1  Screening mammogram in 1 year. ,      12:40 PM Pt has returned from having stress ECHO, report pending.  She is currently asymptomatic.     12:40 PM Call to [facility] to get stress ECHO read.   12:40 PM Pt's stress ECHO was negative.  Call to [facility].  [facility] will contact pt with an appointment for followup, sooner than her current appointment of February 20, 2011.    1. Chest pain  Carleene Cooper III, MD 02/01/11 1239  Carleene Cooper III, MD 02/01/11 1239  Carleene Cooper III, MD 02/01/11 2252

## 2011-02-01 NOTE — Progress Notes (Signed)
*  PRELIMINARY RESULTS* Echocardiogram Echocardiogram Stress Test has been performed.  Clide Deutscher RDCS 02/01/2011, 11:52 AM

## 2011-02-03 ENCOUNTER — Telehealth: Payer: Self-pay | Admitting: Cardiology

## 2011-02-03 NOTE — Telephone Encounter (Signed)
Pt requesting sooner appt than 02-20-11, not having any problems right now, pls call

## 2011-02-03 NOTE — Telephone Encounter (Addendum)
Pt been seen in er/see chart, no current sob/cp/palpitations/not taking bp/p at home. Wants to be seen sooner due to prior x2 palpitations/ new onset, ED stress Myoview=normal function. Cardiac enzymes normal.  BP/p ok in er, This app is an est app/ no available app sooner. Explained to pt to see PCP in mean time, she doesn't want to go back to PCP Dr Clelia Croft stating they don't always return call.   Told her I would update Dr Alford Highland nurse and if appropriate will set earlier app. Pt sounds anxious on phone. Advised reducing caffeine follow with PCP or er. Pt accepting of plan.

## 2011-02-06 NOTE — Telephone Encounter (Signed)
I talked with pt 02/03/11. I have given pt an appt with Dr Shirlee Latch 02/07/11 8am.

## 2011-02-07 ENCOUNTER — Ambulatory Visit (INDEPENDENT_AMBULATORY_CARE_PROVIDER_SITE_OTHER): Payer: Medicare Other | Admitting: Cardiology

## 2011-02-07 ENCOUNTER — Encounter: Payer: Self-pay | Admitting: Cardiology

## 2011-02-07 DIAGNOSIS — R002 Palpitations: Secondary | ICD-10-CM | POA: Diagnosis not present

## 2011-02-07 DIAGNOSIS — I10 Essential (primary) hypertension: Secondary | ICD-10-CM | POA: Diagnosis not present

## 2011-02-07 DIAGNOSIS — F17200 Nicotine dependence, unspecified, uncomplicated: Secondary | ICD-10-CM | POA: Diagnosis not present

## 2011-02-07 DIAGNOSIS — R079 Chest pain, unspecified: Secondary | ICD-10-CM | POA: Insufficient documentation

## 2011-02-07 DIAGNOSIS — IMO0001 Reserved for inherently not codable concepts without codable children: Secondary | ICD-10-CM

## 2011-02-07 MED ORDER — METOPROLOL SUCCINATE ER 50 MG PO TB24: 50.0000 mg | ORAL_TABLET | Freq: Every day | ORAL | Status: DC

## 2011-02-07 NOTE — Patient Instructions (Addendum)
Start Toprol XL (metoprolol succinate) 50mg  daily.  Take and record your blood pressure and heart rate about 2 hours after you take your medication. I will call you in about 2 weeks to get the readings. Luana Shu [phone]  Your physician has recommended that you wear an event monitor. Event monitors are medical devices that record the heart's electrical activity. Doctors most often [place] these monitors to diagnose arrhythmias. Arrhythmias are problems with the speed or rhythm of the heartbeat. The monitor is a small, portable device. You can wear one while you do your normal daily activities. This is usually used to diagnose what is causing palpitations/syncope (passing out). 3 week event monitor  Your physician recommends that you schedule a follow-up appointment in: about 4 weeks after monitor has been completed.

## 2011-02-07 NOTE — Assessment & Plan Note (Signed)
BP is running high.  Anxiety around palpitations could certainly be potentiating this.  Continue lisinopril.  Given palpitations, I will add Toprol XL 50 mg daily for a 2nd agent.  We will call her to see how her BP has been running in a couple of weeks on the additional medication.

## 2011-02-07 NOTE — Assessment & Plan Note (Signed)
Patient has very distressing episodes where she feels her heart race.  She feels like it is somewhat irregular.  No definite trigger.   She has cut out caffeine and chocolate.  No over-the-counter cold meds.  Interestingly, she appears to have a strong family history for atrial fibrillation.  I am going to have her wear a 3 week event monitor to assess for any significant arrhythmias.  Given HTN and distressing palpitations, I will also start her on Toprol XL 50 mg daily.

## 2011-02-07 NOTE — Assessment & Plan Note (Signed)
Atypical chest pain with negative stress echo.  No further workup necessary.

## 2011-02-07 NOTE — Assessment & Plan Note (Signed)
I strongly encouraged her to quit smoking.  

## 2011-02-07 NOTE — Progress Notes (Signed)
PCP: Dr. Clelia Croft  66 yo with history of smoking and HTN presents for evaluation of palpitations.  Patient has been having episodes where she will feel her heart racing.  These have occurred periodically since the beginning of December.  She can identify no trigger for the episodes.  Episodes tend to resolve with sitting down and breathing deeply.  When these episodes occur, she feels very anxious.  She went to he ER at [facility] on 12/20 with racing heart rate. No arrhythmias were noted by the time she made it to the ER.  After that, she stopped caffeine and chocolate.  She went to the ER again last week.  She again felt her heart racing (spontaneously).  She had mild chest pressure and felt anxious/panicked.  No arrhythmia was noted in the ER.  She was ruled out for MI and had a negative stress echo before discharge home.  She has also noted her blood pressure running higher during this period.  BP has been in the 140s/90s-100s.  Today it is 138/96.  Since her ER visit last week, the palpitations seem to have calmed down (no more prolonged episodes).  No further chest tightness.  At baseline, she does yoga 3 times a week and walks about 1/2 mile/day. No exertional dyspnea or chest pain. Main stressor recently has been her daughter's 3rd wedding.   ECG: NSR, normal (ER)  Labs (1/13): K 3.6, creatinine 0.71, HCT 36.3  PMH: 1. HTN 2. Hyperlipidemia 3. Osteoporosis 4. Nephrolithiasis 5. Palpitations 6. Active smoker 7. Stress echo (12/12): Normal study  SH: Smokes 1/2 ppd.  Lives in [place], retired, married.   FH: She thinks that her grandmother, brother, and mother all had atrial fibrillation.   ROS: All systems reviewed and negative except as per HPI   Current Outpatient Prescriptions  Medication Sig Dispense Refill  . aspirin EC 81 MG tablet Take 81 mg by mouth daily.        . calcium carbonate (CALCIUM 500) 1250 MG tablet Take 1 tablet by mouth daily.        . cholecalciferol (VITAMIN  D) 1000 UNITS tablet Take 1,000 Units by mouth daily.        Marland Kitchen lisinopril (PRINIVIL,ZESTRIL) 20 MG tablet Take 20 mg by mouth daily.        . Melatonin 5 MG TABS Take 1 tablet by mouth at bedtime.        . metoprolol succinate (TOPROL XL) 50 MG 24 hr tablet Take 1 tablet (50 mg total) by mouth daily. Take with or immediately following a meal.  30 tablet  6    BP 138/96  Pulse 92  Ht 5\' 7"  (1.702 m)  Wt 60.782 kg (134 lb)  BMI 20.99 kg/m2 General: NAD Neck: No JVD, no thyromegaly or thyroid nodule.  Lungs: Clear to auscultation bilaterally with normal respiratory effort. CV: Nondisplaced PMI.  Heart regular S1/S2, no S3/S4, no murmur.  No peripheral edema.  No carotid bruit.  Normal pedal pulses.  Abdomen: Soft, nontender, no hepatosplenomegaly, no distention.  Skin: Intact without lesions or rashes.  Neurologic: Alert and oriented x 3.  Psych: Normal affect. Extremities: No clubbing or cyanosis.  HEENT: Normal.

## 2011-02-15 ENCOUNTER — Encounter (INDEPENDENT_AMBULATORY_CARE_PROVIDER_SITE_OTHER): Payer: Medicare Other

## 2011-02-15 DIAGNOSIS — R002 Palpitations: Secondary | ICD-10-CM | POA: Diagnosis not present

## 2011-02-20 ENCOUNTER — Ambulatory Visit: Payer: Medicare Other | Admitting: Cardiology

## 2011-02-27 ENCOUNTER — Telehealth: Payer: Self-pay | Admitting: *Deleted

## 2011-02-27 NOTE — Telephone Encounter (Signed)
F/U  ° °Patient returning nurse call, she can be reached at hm#  °

## 2011-02-27 NOTE — Telephone Encounter (Signed)
HYPERTENSION - Suzanne Ancona, MD 02/07/2011 3:56 PM Signed  BP is running high. Anxiety around palpitations could certainly be potentiating this. Continue lisinopril. Given palpitations, I will add Toprol XL 50 mg daily for a 2nd agent. We will call her to see how her BP has been running in a couple of weeks on the additional medication.   02/27/11 LMTCB

## 2011-02-27 NOTE — Telephone Encounter (Signed)
I talked with pt. Pt states she is feeling much better. Recent BP readings--02/08/11 127/87 79 02/10/11 157/91  70 02/11/11 140/84 66  02/12/11 147/85 67 02/14/11 148/89  66 02/18/11 136/82  66  02/19/11 139/84  62  02/22/11 129/90 70  02/23/11 151/90 65 --pt states she has been eating out a lot and has been eating a lot of salt. She is decreasing her salt intake.

## 2011-03-01 NOTE — Telephone Encounter (Signed)
LMTCB

## 2011-03-01 NOTE — Telephone Encounter (Signed)
F/U   Patient returning Nurse AL call, she can be reached at [phone]

## 2011-03-01 NOTE — Telephone Encounter (Signed)
Discussed with pt

## 2011-03-01 NOTE — Telephone Encounter (Signed)
OK for now.  Still a little high.  Cut back on salt.

## 2011-03-15 ENCOUNTER — Ambulatory Visit: Payer: Medicare Other | Admitting: Cardiology

## 2011-03-17 ENCOUNTER — Ambulatory Visit: Payer: Medicare Other | Admitting: Cardiology

## 2011-03-21 ENCOUNTER — Ambulatory Visit (INDEPENDENT_AMBULATORY_CARE_PROVIDER_SITE_OTHER): Payer: Medicare Other | Admitting: Cardiology

## 2011-03-21 ENCOUNTER — Encounter: Payer: Self-pay | Admitting: Cardiology

## 2011-03-21 VITALS — BP 150/95 | HR 64 | Ht 67.0 in | Wt 134.0 lb

## 2011-03-21 DIAGNOSIS — F17200 Nicotine dependence, unspecified, uncomplicated: Secondary | ICD-10-CM | POA: Diagnosis not present

## 2011-03-21 DIAGNOSIS — I10 Essential (primary) hypertension: Secondary | ICD-10-CM

## 2011-03-21 DIAGNOSIS — R002 Palpitations: Secondary | ICD-10-CM | POA: Diagnosis not present

## 2011-03-21 MED ORDER — LISINOPRIL 40 MG PO TABS: 40.0000 mg | ORAL_TABLET | Freq: Every day | ORAL | Status: DC

## 2011-03-21 NOTE — Assessment & Plan Note (Signed)
Almost completely suppressed by Toprol XL and cutting back on caffeine.  Certainly possible that they were premature beats.  3-week event monitor showed no significant events.

## 2011-03-21 NOTE — Progress Notes (Signed)
PCP: Dr. Clelia Croft  66 yo with history of smoking and HTN returns for evaluation of palpitations.  Patient has been having episodes where she will feel her heart racing.  These have occurred periodically since the beginning of December.  She can identify no trigger for the episodes.  Episodes tend to resolve with sitting down and breathing deeply.  When these episodes occur, she feels very anxious.  She went to he ER at [facility] on 12/20 with racing heart rate. No arrhythmias were noted by the time she made it to the ER.  After that, she stopped caffeine and chocolate.  She went to the ER a few weeks later.  She again felt her heart racing (spontaneously).  She had mild chest pressure and felt anxious/panicked.  No arrhythmia was noted in the ER.  She was ruled out for MI and had a negative stress echo before discharge home.  At baseline, she does yoga 3 times a week and walks about 1/2 mile/day. No exertional dyspnea or chest pain. Main stressor recently has been her daughter's 3rd wedding.   At last appointment, given her very distressing symptoms, I started her on Toprol XL.  She has felt much better since then.  The palpitations have resolved.  BP is still running high, with systolics typically in the 140s.  She is still smoking.  3 week event monitor showed no significant arrhythmic event   Labs (1/13): K 3.6, creatinine 0.71, HCT 36.3  PMH: 1. HTN 2. Hyperlipidemia 3. Osteoporosis 4. Nephrolithiasis 5. Palpitations: 3 week event monitor in 1/13 with no significant arrhythmic events.  6. Active smoker 7. Stress echo (12/12): Normal study  SH: Smokes 1/2 ppd.  Lives in [place], retired, married.   FH: She thinks that her grandmother, brother, and mother all had atrial fibrillation.   ROS: All systems reviewed and negative except as per HPI   Current Outpatient Prescriptions  Medication Sig Dispense Refill  . aspirin EC 81 MG tablet Take 81 mg by mouth daily.        . calcium carbonate  (CALCIUM 500) 1250 MG tablet Take 1 tablet by mouth daily.        . cholecalciferol (VITAMIN D) 1000 UNITS tablet Take 1,000 Units by mouth daily.        Marland Kitchen lisinopril (PRINIVIL,ZESTRIL) 40 MG tablet Take 1 tablet (40 mg total) by mouth daily.  30 tablet  6  . Melatonin 5 MG TABS Take 1 tablet by mouth at bedtime. As needed       . metoprolol succinate (TOPROL XL) 50 MG 24 hr tablet Take 1 tablet (50 mg total) by mouth daily. Take with or immediately following a meal.  30 tablet  6    BP 150/95  Pulse 64  Ht 5\' 7"  (1.702 m)  Wt 134 lb (60.782 kg)  BMI 20.99 kg/m2 General: NAD Neck: No JVD, no thyromegaly or thyroid nodule.  Lungs: Clear to auscultation bilaterally with normal respiratory effort. CV: Nondisplaced PMI.  Heart regular S1/S2, no S3/S4, no murmur.  No peripheral edema.  No carotid bruit.  Normal pedal pulses.  Abdomen: Soft, nontender, no hepatosplenomegaly, no distention.  Neurologic: Alert and oriented x 3.  Psych: Normal affect. Extremities: No clubbing or cyanosis.

## 2011-03-21 NOTE — Assessment & Plan Note (Signed)
BP still running high.  I will have her increase lisinopril to 40 mg daily with BMET and BP check in 2 wks.   Followup in 1 year.

## 2011-03-21 NOTE — Assessment & Plan Note (Signed)
Still smoking.  I again encouraged her to quit.

## 2011-03-21 NOTE — Patient Instructions (Addendum)
Your physician wants you to follow-up in: one year.   You will receive a reminder letter in the mail two months in advance. If you don't receive a letter, please call our office to schedule the follow-up appointment.  Your physician has recommended you make the following change in your medication: Increase your lisinopril to 40mg  daily  Your physician recommends that you return for lab work in:  2 weeks  (bmet)  Call Dr Alford Highland nurse, [place] with your blood pressure readings in 2 weeks.  [phone]

## 2011-03-29 ENCOUNTER — Ambulatory Visit (INDEPENDENT_AMBULATORY_CARE_PROVIDER_SITE_OTHER): Payer: Medicare Other | Admitting: Internal Medicine

## 2011-03-29 ENCOUNTER — Encounter: Payer: Self-pay | Admitting: Internal Medicine

## 2011-03-29 DIAGNOSIS — Z800 Family history of malignant neoplasm of digestive organs: Secondary | ICD-10-CM

## 2011-03-29 DIAGNOSIS — E785 Hyperlipidemia, unspecified: Secondary | ICD-10-CM | POA: Diagnosis not present

## 2011-03-29 DIAGNOSIS — Z803 Family history of malignant neoplasm of breast: Secondary | ICD-10-CM | POA: Insufficient documentation

## 2011-03-29 NOTE — Patient Instructions (Signed)
Keep appt with Dr. Shirlee Latch  See me in May

## 2011-03-29 NOTE — Progress Notes (Signed)
Subjective:    Patient ID: Suzanne Turner, female    DOB: [birth year], 66 y.o.   MRN: [id number]  HPI New pt here for first visit.  Former pt of Dr. Sherryll Burger at [facility].  PMH of htn, hyperlipidemia, palpitations, osteopenia and FH of colon CA in mother  Pt reports she has seen Dr. Shirlee Latch and he raised her lisinopril dose 1 week ago/  She is asymptomatic  No headaches no chest pain no Le edema.  Her palpitaitons have markedly decreased since she is on metoprolol.  She has had recent blood work form Dr. Shirlee Latch  Allergies  Allergen Reactions  . Medrol (Methylprednisolone)     swelling  . Penicillins Rash  . Sulfonamide Derivatives Rash   Past Medical History  Diagnosis Date  . Hypertension   . Osteoarthritis   . Kidney stone   . Bronchitis   . Osteopenia   . High cholesterol    Past Surgical History  Procedure Date  . Tonsillectomy 1949  . Tubal ligation 1977   History   Social History  . Marital Status: Widowed    Spouse Name: N/A    Number of Children: N/A  . Years of Education: N/A   Occupational History  . Not on file.   Social History Main Topics  . Smoking status: Current Everyday Smoker -- 0.5 packs/day for 40 years  . Smokeless tobacco: Never Used  . Alcohol Use: No  . Drug Use: No  . Sexually Active: Not Currently   Other Topics Concern  . Not on file   Social History Narrative  . No narrative on file   Family History  Problem Relation Age of Onset  . Colon cancer Mother   . Arrhythmia Mother   . Arrhythmia Brother   . Arrhythmia Maternal Uncle   . Arrhythmia Maternal Grandmother   . Breast cancer Sister   . Colon cancer Maternal Aunt    Patient Active Problem List  Diagnoses  . VITAMIN D DEFICIENCY  . HYPERTENSION  . CONSTIPATION  . OSTEOPENIA  . Palpitations  . Chest pain  . Smoking  . Hyperlipidemia   Current Outpatient Prescriptions on File Prior to Visit  Medication Sig Dispense Refill  . aspirin EC 81 MG tablet Take 81 mg by  mouth daily.        . calcium carbonate (CALCIUM 500) 1250 MG tablet Take 1 tablet by mouth daily.        . cholecalciferol (VITAMIN D) 1000 UNITS tablet Take 1,000 Units by mouth daily.        Marland Kitchen lisinopril (PRINIVIL,ZESTRIL) 40 MG tablet Take 1 tablet (40 mg total) by mouth daily.  30 tablet  6  . metoprolol succinate (TOPROL XL) 50 MG 24 hr tablet Take 1 tablet (50 mg total) by mouth daily. Take with or immediately following a meal.  30 tablet  6  . Melatonin 5 MG TABS Take 1 tablet by mouth at bedtime. As needed            Review of Systems see HPI   Objective:   Physical Exam Physical Exam  Nursing note and vitals reviewed.  Constitutional: She is oriented to person, place, and time. She appears well-developed and well-nourished.  HENT:  Head: Normocephalic and atraumatic.  Cardiovascular: Normal rate and regular rhythm. Exam reveals no gallop and no friction rub.  No murmur heard.  Pulmonary/Chest: Breath sounds normal. She has no wheezes. She has no rales.  Neurological: She is alert and oriented  to person, place, and time.  Skin: Skin is warm and dry.  Psychiatric: She has a normal mood and affect. Her behavior is normal.        Assessment & Plan:  1)  Htn  Not quite at goal but just increased dose of Lisinopril .  She reports she will have blood drawn next week and another BP check with Dr. Alford Highland office 2)  Hyperlipidemia 3)  FH colon CA in mother 4)  Osteopenia  To schedule Comprehensive eval when due.

## 2011-03-30 ENCOUNTER — Telehealth: Payer: Self-pay | Admitting: Cardiology

## 2011-03-30 NOTE — Telephone Encounter (Signed)
LOVx3,Echo,Monitor,12 lead faxed to [facility] @ [phone] 03/30/11/KM

## 2011-03-31 DIAGNOSIS — H04129 Dry eye syndrome of unspecified lacrimal gland: Secondary | ICD-10-CM | POA: Diagnosis not present

## 2011-03-31 DIAGNOSIS — H43819 Vitreous degeneration, unspecified eye: Secondary | ICD-10-CM | POA: Diagnosis not present

## 2011-03-31 DIAGNOSIS — H5210 Myopia, unspecified eye: Secondary | ICD-10-CM | POA: Diagnosis not present

## 2011-03-31 DIAGNOSIS — H25049 Posterior subcapsular polar age-related cataract, unspecified eye: Secondary | ICD-10-CM | POA: Diagnosis not present

## 2011-04-04 ENCOUNTER — Telehealth: Payer: Self-pay | Admitting: Cardiology

## 2011-04-04 ENCOUNTER — Other Ambulatory Visit (INDEPENDENT_AMBULATORY_CARE_PROVIDER_SITE_OTHER): Payer: Medicare Other

## 2011-04-04 DIAGNOSIS — I10 Essential (primary) hypertension: Secondary | ICD-10-CM

## 2011-04-04 LAB — BASIC METABOLIC PANEL
BUN: 12 mg/dL (ref 6–23)
CO2: 29 mEq/L (ref 19–32)
Calcium: 9.4 mg/dL (ref 8.4–10.5)
Chloride: 102 mEq/L (ref 96–112)
Creatinine, Ser: 0.9 mg/dL (ref 0.4–1.2)
GFR: 71.14 mL/min (ref >60.00)
Glucose, Bld: 61 mg/dL — ABNORMAL LOW (ref 70–99)
Potassium: 3.7 mEq/L (ref 3.5–5.1)
Sodium: 137 mEq/L (ref 135–145)

## 2011-04-04 NOTE — Telephone Encounter (Signed)
BP still a little high but better.  No changes for now.

## 2011-04-04 NOTE — Telephone Encounter (Signed)
Please return call to patient at hm# [phone]

## 2011-04-04 NOTE — Telephone Encounter (Signed)
Patient called to report B/P readings since she increased lisinopril to 40 mg daily on 03/24/11.B/P readings-139/86 pulse 80 beats/min,141/88 pulse 79 ,136/78 pulse-72,140/80 pulse-72,153/93,141/83 ZOXWR-60,454/09 pulse-64.Fowarded to [employer].

## 2011-04-05 NOTE — Telephone Encounter (Signed)
Pt.notified

## 2011-04-09 ENCOUNTER — Encounter: Payer: Self-pay | Admitting: Internal Medicine

## 2011-04-09 DIAGNOSIS — Z87442 Personal history of urinary calculi: Secondary | ICD-10-CM | POA: Insufficient documentation

## 2011-04-17 ENCOUNTER — Encounter: Payer: Self-pay | Admitting: Internal Medicine

## 2011-04-17 DIAGNOSIS — R251 Tremor, unspecified: Secondary | ICD-10-CM | POA: Insufficient documentation

## 2011-04-17 DIAGNOSIS — K635 Polyp of colon: Secondary | ICD-10-CM | POA: Insufficient documentation

## 2011-04-18 ENCOUNTER — Encounter: Payer: Self-pay | Admitting: Internal Medicine

## 2011-06-21 ENCOUNTER — Encounter: Payer: Medicare Other | Admitting: Internal Medicine

## 2011-06-29 ENCOUNTER — Encounter: Payer: Self-pay | Admitting: Internal Medicine

## 2011-06-29 ENCOUNTER — Other Ambulatory Visit (HOSPITAL_COMMUNITY)
Admission: RE | Admit: 2011-06-29 | Discharge: 2011-06-29 | Disposition: A | Discharge status: Home / Self Care | Payer: Medicare Other | Source: Ambulatory Visit | Attending: Internal Medicine | Admitting: Internal Medicine

## 2011-06-29 ENCOUNTER — Ambulatory Visit (INDEPENDENT_AMBULATORY_CARE_PROVIDER_SITE_OTHER): Payer: Medicare Other | Admitting: Internal Medicine

## 2011-06-29 VITALS — BP 128/70 | HR 69 | Temp 97.0°F | Resp 16 | Wt 131.0 lb

## 2011-06-29 DIAGNOSIS — I10 Essential (primary) hypertension: Secondary | ICD-10-CM

## 2011-06-29 DIAGNOSIS — M899 Disorder of bone, unspecified: Secondary | ICD-10-CM | POA: Diagnosis not present

## 2011-06-29 DIAGNOSIS — Z0000 Encounter for general adult medical examination without abnormal findings: Secondary | ICD-10-CM

## 2011-06-29 DIAGNOSIS — Z1159 Encounter for screening for other viral diseases: Secondary | ICD-10-CM | POA: Insufficient documentation

## 2011-06-29 DIAGNOSIS — M949 Disorder of cartilage, unspecified: Secondary | ICD-10-CM

## 2011-06-29 DIAGNOSIS — E785 Hyperlipidemia, unspecified: Secondary | ICD-10-CM

## 2011-06-29 DIAGNOSIS — Z124 Encounter for screening for malignant neoplasm of cervix: Secondary | ICD-10-CM | POA: Diagnosis not present

## 2011-06-29 DIAGNOSIS — E559 Vitamin D deficiency, unspecified: Secondary | ICD-10-CM

## 2011-06-29 LAB — CBC WITH DIFFERENTIAL/PLATELET
Basophils Absolute: 0.1 10*3/uL (ref 0.0–0.1)
Basophils Relative: 2 % — ABNORMAL HIGH (ref 0–1)
Eosinophils Absolute: 0.1 10*3/uL (ref 0.0–0.7)
Eosinophils Relative: 3 % (ref 0–5)
HCT: 38.2 % (ref 36.0–46.0)
Hemoglobin: 12.6 g/dL (ref 12.0–15.0)
Lymphocytes Relative: 21 % (ref 12–46)
Lymphs Abs: 0.9 10*3/uL (ref 0.7–4.0)
MCH: 31.1 pg (ref 26.0–34.0)
MCHC: 33 g/dL (ref 30.0–36.0)
MCV: 94.3 fL (ref 78.0–100.0)
Monocytes Absolute: 0.5 10*3/uL (ref 0.1–1.0)
Monocytes Relative: 11 % (ref 3–12)
Neutro Abs: 2.6 10*3/uL (ref 1.7–7.7)
Neutrophils Relative %: 63 % (ref 43–77)
Platelets: 215 10*3/uL (ref 150–400)
RBC: 4.05 MIL/uL (ref 3.87–5.11)
RDW: 13.4 % (ref 11.5–15.5)
WBC: 4.1 10*3/uL (ref 4.0–10.5)

## 2011-06-29 LAB — POCT URINALYSIS DIPSTICK
Bilirubin, UA: NEGATIVE
Blood, UA: NEGATIVE
Glucose, UA: NEGATIVE
Ketones, UA: NEGATIVE
Leukocytes, UA: NEGATIVE
Nitrite, UA: NEGATIVE
Protein, UA: NEGATIVE
Spec Grav, UA: 1.01
Urobilinogen, UA: NEGATIVE
pH, UA: 7.5

## 2011-06-29 LAB — TSH: TSH: 2.444 u[IU]/mL (ref 0.350–4.500)

## 2011-06-29 MED ORDER — NYSTATIN-TRIAMCINOLONE 100000-0.1 UNIT/GM-% EX OINT: TOPICAL_OINTMENT | CUTANEOUS | Status: DC

## 2011-06-29 NOTE — Progress Notes (Signed)
Subjective:    Patient ID: Suzanne Turner, female    DOB: May 16, 1945, 66 y.o.   MRN: [id number]  HPI Suzanne Turner is here for comprehensive eval.   She reports she has had a pink itchy rash over both arms since January when her Lisionopril dose was increased.  She relates this temporally to the 40 mg Lisinopril change.  She spoke with pharmacist who stated she may be reacting to the dye in the med.  She has not sought any medical evaluation for this  She also has reddened nummular lesions on R leg and both forearms that are itchy and scaly.  She saw Dr.  Leonie Turner who gave her clobetasol which pt used.  Pt reports it did no help and she did not go back to dermatologist.  She reports red lesions have been there for 3 years.  She does swimming aerobics for exercise.    She also has an intention tremor in hands that dates back to 1980's.  Both father and brother have similar tremor.  Tremor not present at rest and is worse when drinking liquid and using her power drill.  She denies stiffness of muscles, no change in pace of gait., no drooling, dysphonia,  Or dysphagia. No FH of parkinson's .  She is on beta blocker now   Allergies  Allergen Reactions  . Medrol (Methylprednisolone)     swelling  . Penicillins Rash  . Sulfonamide Derivatives Rash   Past Medical History  Diagnosis Date  . Hypertension   . Osteoarthritis   . Kidney stone   . Bronchitis   . Osteopenia   . High cholesterol    Past Surgical History  Procedure Date  . Tonsillectomy 1949  . Tubal ligation 1977   History   Social History  . Marital Status: Widowed    Spouse Name: N/A    Number of Children: N/A  . Years of Education: N/A   Occupational History  . Not on file.   Social History Main Topics  . Smoking status: Current Everyday Smoker -- 0.5 packs/day for 40 years  . Smokeless tobacco: Never Used  . Alcohol Use: No  . Drug Use: No  . Sexually Active: Not Currently   Other Topics Concern  . Not on file     Social History Narrative  . No narrative on file   Family History  Problem Relation Age of Onset  . Colon cancer Mother   . Arrhythmia Mother   . Arrhythmia Brother   . Arrhythmia Maternal Uncle   . Arrhythmia Maternal Grandmother   . Breast cancer Sister   . Colon cancer Maternal Aunt    Patient Active Problem List  Diagnoses  . VITAMIN D DEFICIENCY  . HYPERTENSION  . CONSTIPATION  . OSTEOPENIA  . Palpitations  . Chest pain  . Smoking  . Hyperlipidemia  . Family history of colon cancer  . Family history of breast cancer in first degree relative  . History of renal stone  . Colon polyps  . Tremor   Current Outpatient Prescriptions on File Prior to Visit  Medication Sig Dispense Refill  . aspirin EC 81 MG tablet Take 81 mg by mouth daily.        . calcium carbonate (CALCIUM 500) 1250 MG tablet Take 1 tablet by mouth daily.        . cholecalciferol (VITAMIN D) 1000 UNITS tablet Take 1,000 Units by mouth daily.        . Melatonin 5 MG TABS  Take 1 tablet by mouth at bedtime. As needed       . metoprolol succinate (TOPROL XL) 50 MG 24 hr tablet Take 1 tablet (50 mg total) by mouth daily. Take with or immediately following a meal.  30 tablet  6         Review of Systems  Constitutional: Negative.   HENT: Negative.   Eyes: Negative.   Respiratory: Negative.   Cardiovascular: Negative.   Gastrointestinal: Negative.   Genitourinary: Negative.   Musculoskeletal: Negative.   Skin: Positive for color change and rash.  Neurological: Positive for tremors.  Hematological: Negative.   Psychiatric/Behavioral: Negative.        Objective:   Physical Exam Physical Exam  Vital signs and nursing note reviewed  Constitutional: She is oriented to person, place, and time. She appears well-developed and well-nourished. She is cooperative.  HENT:  Head: Normocephalic and atraumatic.  Right Ear: Tympanic membrane normal.  Left Ear: Tympanic membrane normal.  Nose: Nose  normal.  Mouth/Throat: Oropharynx is clear and moist and mucous membranes are normal. No oropharyngeal exudate or posterior oropharyngeal erythema.  Eyes: Conjunctivae and EOM are normal. Pupils are equal, round, and reactive to light.  Neck: Neck supple. No JVD present. Carotid bruit is not present. No mass and no thyromegaly present.  Cardiovascular: Regular rhythm, normal heart sounds, intact distal pulses and normal pulses.  Exam reveals no gallop and no friction rub.   No murmur heard. Pulses:      Dorsalis pedis pulses are 2+ on the right side, and 2+ on the left side.  Pulmonary/Chest: Breath sounds normal. She has no wheezes. She has no rhonchi. She has no rales. Right breast exhibits no mass, no nipple discharge and no skin change. Left breast exhibits no mass, no nipple discharge and no skin change.  Abdominal: Soft. Bowel sounds are normal. She exhibits no distension and no mass. There is no hepatosplenomegaly. There is no tenderness. There is no CVA tenderness.  Genitourinary: Rectum normal, vagina normal and uterus normal. Rectal exam shows no mass. Guaiac negative stool. No labial fusion. There is no lesion on the right labia. There is no lesion on the left labia. Cervix exhibits no motion tenderness. Right adnexum displays no mass, no tenderness and no fullness. Left adnexum displays no mass, no tenderness and no fullness. No erythema around the vagina.  Musculoskeletal:       No active synovitis to any joint.    Lymphadenopathy:       Right cervical: No superficial cervical adenopathy present.      Left cervical: No superficial cervical adenopathy present.       Right axillary: No pectoral and no lateral adenopathy present.       Left axillary: No pectoral and no lateral adenopathy present.      Right: No inguinal adenopathy present.       Left: No inguinal adenopathy present.  Neurological: She is alert and oriented to person, place, and time. She has normal strength and normal  reflexes. No cranial nerve deficit or sensory deficit. She displays a negative Romberg sign. Coordination and gait normal.  She has intention tremor bilaterally   Skin: Skin is warm and dry.    Nails show no clubbing.  Psychiatric: She has a normal mood and affect. Her speech is normal and behavior is normal.          Assessment & Plan:   HTN  Well controlled .  Will empirically stop Lisinopril due  to dermatitis and increase Metoprolol to 100 mg daily.  See me in 3 weeks to check BP and see if fatigue a side effect of increased dose  Dermatitis possible med related to lisinopril  See above.  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  No evidence on exam for Parkinsonianism.  Will see if increased dose of Beta blocker will help if pt can tolerate higher dose.    Vitamin D deficiency  Osteopenia  Due dexa in 2014  Palpitaitons  Managed Dr. Shirlee Latch  Tobacco use  Cessastion discussed pt not ready now  FH of colon CA  Counsled pt to call  Dr. Regino Schultze office to see if she is due  FH breast CA:  UTD with mammogram  I spent 45 minutes with this pt      Assessment & Plan:

## 2011-06-29 NOTE — Patient Instructions (Signed)
See me in 3 weeks

## 2011-06-30 LAB — COMPREHENSIVE METABOLIC PANEL
ALT: 10 U/L (ref 0–35)
AST: 18 U/L (ref 0–37)
Albumin: 4.3 g/dL (ref 3.5–5.2)
Alkaline Phosphatase: 52 U/L (ref 39–117)
BUN: 14 mg/dL (ref 6–23)
CO2: 28 mEq/L (ref 19–32)
Calcium: 9.2 mg/dL (ref 8.4–10.5)
Chloride: 104 mEq/L (ref 96–112)
Creat: 0.85 mg/dL (ref 0.50–1.10)
Glucose, Bld: 75 mg/dL (ref 70–99)
Potassium: 3.9 mEq/L (ref 3.5–5.3)
Sodium: 139 mEq/L (ref 135–145)
Total Bilirubin: 0.5 mg/dL (ref 0.3–1.2)
Total Protein: 7.1 g/dL (ref 6.0–8.3)

## 2011-06-30 LAB — LIPID PANEL
Cholesterol: 200 mg/dL (ref 0–200)
HDL: 48 mg/dL (ref >39)
LDL Cholesterol: 131 mg/dL — ABNORMAL HIGH (ref 0–99)
Total CHOL/HDL Ratio: 4.2 Ratio
Triglycerides: 103 mg/dL (ref <150)
VLDL: 21 mg/dL (ref 0–40)

## 2011-06-30 LAB — VITAMIN D 25 HYDROXY (VIT D DEFICIENCY, FRACTURES): Vit D, 25-Hydroxy: 55 ng/mL (ref 30–89)

## 2011-07-04 ENCOUNTER — Telehealth: Payer: Self-pay | Admitting: *Deleted

## 2011-07-04 ENCOUNTER — Encounter: Payer: Self-pay | Admitting: *Deleted

## 2011-07-04 NOTE — Telephone Encounter (Signed)
Copy of labs mailed to pt's home address. 

## 2011-07-20 ENCOUNTER — Ambulatory Visit (INDEPENDENT_AMBULATORY_CARE_PROVIDER_SITE_OTHER): Payer: Medicare Other | Admitting: Internal Medicine

## 2011-07-20 ENCOUNTER — Other Ambulatory Visit: Payer: Self-pay | Admitting: Dermatology

## 2011-07-20 ENCOUNTER — Encounter: Payer: Self-pay | Admitting: Internal Medicine

## 2011-07-20 VITALS — BP 129/77 | HR 69 | Temp 97.2°F | Resp 16 | Ht 67.0 in | Wt 130.0 lb

## 2011-07-20 DIAGNOSIS — R21 Rash and other nonspecific skin eruption: Secondary | ICD-10-CM

## 2011-07-20 DIAGNOSIS — L259 Unspecified contact dermatitis, unspecified cause: Secondary | ICD-10-CM | POA: Diagnosis not present

## 2011-07-20 DIAGNOSIS — L408 Other psoriasis: Secondary | ICD-10-CM | POA: Diagnosis not present

## 2011-07-20 NOTE — Progress Notes (Signed)
Subjective:    Patient ID: Suzanne Turner, female    DOB: 27-Mar-1945, 66 y.o.   MRN: [id number]  HPI  Saree is here for follow up.  Her BP at home as been fine and she feels fine but rash is worsening on her leg.  She has been using Mycolog.  She continues reddened rash on both arms.    I stopped the Lisinopril empirically to see if rash improved but it has not.   She has increased her Metoprolol to 100 mg daily.  She had seen Dr. Rhea Pink in the past who stated she had eczema but pt has not been back  Allergies  Allergen Reactions  . Medrol (Methylprednisolone)     swelling  . Penicillins Rash  . Sulfonamide Derivatives Rash   Past Medical History  Diagnosis Date  . Hypertension   . Osteoarthritis   . Kidney stone   . Bronchitis   . Osteopenia   . High cholesterol    Past Surgical History  Procedure Date  . Tonsillectomy 1949  . Tubal ligation 1977   History   Social History  . Marital Status: Widowed    Spouse Name: N/A    Number of Children: N/A  . Years of Education: N/A   Occupational History  . Not on file.   Social History Main Topics  . Smoking status: Current Everyday Smoker -- 0.5 packs/day for 40 years  . Smokeless tobacco: Never Used  . Alcohol Use: No  . Drug Use: No  . Sexually Active: Not Currently   Other Topics Concern  . Not on file   Social History Narrative  . No narrative on file   Family History  Problem Relation Age of Onset  . Colon cancer Mother   . Arrhythmia Mother   . Arrhythmia Brother   . Arrhythmia Maternal Uncle   . Arrhythmia Maternal Grandmother   . Breast cancer Sister   . Colon cancer Maternal Aunt    Patient Active Problem List  Diagnosis  . VITAMIN D DEFICIENCY  . HYPERTENSION  . CONSTIPATION  . OSTEOPENIA  . Palpitations  . Chest pain  . Smoking  . Hyperlipidemia  . Family history of colon cancer  . Family history of breast cancer in first degree relative  . History of renal stone  . Colon polyps   . Tremor   Current Outpatient Prescriptions on File Prior to Visit  Medication Sig Dispense Refill  . aspirin EC 81 MG tablet Take 81 mg by mouth daily.        . calcium carbonate (CALCIUM 500) 1250 MG tablet Take 1 tablet by mouth daily.        . cholecalciferol (VITAMIN D) 1000 UNITS tablet Take 1,000 Units by mouth daily.        . Melatonin 5 MG TABS Take 1 tablet by mouth at bedtime. As needed       . metoprolol succinate (TOPROL XL) 50 MG 24 hr tablet Take 1 tablet (50 mg total) by mouth daily. Take with or immediately following a meal.  30 tablet  6  . nystatin-triamcinolone ointment (MYCOLOG) Apply topically 3 times per day  30 g  1      Review of Systems See HPI    Objective:   Physical Exam Physical Exam  Nursing note and vitals reviewed.  Constitutional: She is oriented to person, place, and time. She appears well-developed and well-nourished.  HENT:  Head: Normocephalic and atraumatic.  Cardiovascular: Normal rate and  regular rhythm. Exam reveals no gallop and no friction rub.  No murmur heard.  Pulmonary/Chest: Breath sounds normal. She has no wheezes. She has no rales.  Neurological: She is alert and oriented to person, place, and time.  Skin: Skin is warm and dry.   Vascular appearing flushing over both arms.  No blisters or lesions  R leg  2 reddened maculao-papular lesions Psychiatric: She has a normal mood and affect. Her behavior is normal.             Assessment & Plan:  Skin rash  Worsening  Pt has appt with Dr. Donzetta Starch this afternoon at 2:30    [id number] Mycolog.  OK to decrease her Metoprolol to 50 mg daily  See me monday

## 2011-07-20 NOTE — Patient Instructions (Addendum)
t see dermatologist this afternoon

## 2011-07-24 ENCOUNTER — Ambulatory Visit: Payer: Medicare Other | Admitting: Internal Medicine

## 2011-08-21 ENCOUNTER — Telehealth: Payer: Self-pay | Admitting: *Deleted

## 2011-08-21 NOTE — Telephone Encounter (Signed)
Spoke with patient and per last procedure note recall is 06/2013. Mailed patient a copy of report.

## 2011-08-22 ENCOUNTER — Other Ambulatory Visit: Payer: Self-pay | Admitting: Cardiology

## 2011-08-25 ENCOUNTER — Ambulatory Visit (INDEPENDENT_AMBULATORY_CARE_PROVIDER_SITE_OTHER): Payer: Medicare Other | Admitting: Family Medicine

## 2011-08-25 VITALS — BP 137/79 | HR 83 | Temp 98.3°F | Resp 16 | Ht 67.0 in | Wt 170.0 lb

## 2011-08-25 DIAGNOSIS — K047 Periapical abscess without sinus: Secondary | ICD-10-CM | POA: Diagnosis not present

## 2011-08-25 MED ORDER — CLINDAMYCIN HCL 150 MG PO CAPS: 150.0000 mg | ORAL_CAPSULE | Freq: Three times a day (TID) | ORAL | Status: AC

## 2011-08-25 NOTE — Progress Notes (Signed)
66 yo with recent dental work on tooth #30, now has 2 days of progressive swelling right mandible  O:  Obvious swelling corresponding to tooth #30  A:  Dental abscess  1. Dental abscess  clindamycin (CLEOCIN) 150 MG capsule

## 2011-11-17 ENCOUNTER — Telehealth: Payer: Self-pay | Admitting: Cardiology

## 2011-11-17 NOTE — Telephone Encounter (Signed)
Pt is on metoprolol and she thinks it is making her hair fall out and she has bald spots and she wants to know what to do

## 2011-11-17 NOTE — Telephone Encounter (Signed)
Patient called because she said has been loosing her hear since the started taken  Metoprolol succinate 50 mg inJanuary 2013 and is getting worse. Pt read on Web MD which it  Reads that this one of this medication's side effects. Patient would like to know what Dr. Shirlee Latch recommends.

## 2011-11-20 NOTE — Telephone Encounter (Signed)
LMTCB

## 2011-11-20 NOTE — Telephone Encounter (Signed)
Pt advised,verbalized understanding. Pt has decided to try biotin supplement for hair loss before stopping metoprolol.

## 2011-11-20 NOTE — Telephone Encounter (Signed)
F/u  ° °Pt returning nurse call, she can be reached at hm#  °

## 2011-11-20 NOTE — Telephone Encounter (Signed)
She can try going off it (cut in half for a few days then stop).  If her palpitations return, could try CCB.

## 2011-11-20 NOTE — Telephone Encounter (Signed)
I will forward to Dr McLean for review 

## 2011-11-30 ENCOUNTER — Other Ambulatory Visit: Payer: Self-pay | Admitting: Cardiology

## 2011-12-01 NOTE — Telephone Encounter (Signed)
Fax Received. Refill Completed. Suzanne Turner (R.M.A)   

## 2012-01-12 ENCOUNTER — Other Ambulatory Visit: Payer: Self-pay | Admitting: Internal Medicine

## 2012-01-12 DIAGNOSIS — Z1231 Encounter for screening mammogram for malignant neoplasm of breast: Secondary | ICD-10-CM

## 2012-02-06 ENCOUNTER — Ambulatory Visit
Admission: RE | Admit: 2012-02-06 | Discharge: 2012-02-06 | Disposition: A | Discharge status: Home / Self Care | Payer: Medicare Other | Source: Ambulatory Visit | Attending: Internal Medicine | Admitting: Internal Medicine

## 2012-02-06 DIAGNOSIS — Z1231 Encounter for screening mammogram for malignant neoplasm of breast: Secondary | ICD-10-CM

## 2012-02-14 ENCOUNTER — Ambulatory Visit (INDEPENDENT_AMBULATORY_CARE_PROVIDER_SITE_OTHER): Payer: Medicare Other | Admitting: Internal Medicine

## 2012-02-14 ENCOUNTER — Encounter: Payer: Self-pay | Admitting: Internal Medicine

## 2012-02-14 VITALS — BP 136/80 | HR 86 | Temp 97.5°F | Resp 85 | Wt 132.0 lb

## 2012-02-14 DIAGNOSIS — I10 Essential (primary) hypertension: Secondary | ICD-10-CM | POA: Diagnosis not present

## 2012-02-14 DIAGNOSIS — K115 Sialolithiasis: Secondary | ICD-10-CM | POA: Diagnosis not present

## 2012-02-14 DIAGNOSIS — K111 Hypertrophy of salivary gland: Secondary | ICD-10-CM | POA: Diagnosis not present

## 2012-02-14 LAB — CBC WITH DIFFERENTIAL/PLATELET
Basophils Absolute: 0 10*3/uL (ref 0.0–0.1)
Basophils Relative: 1 % (ref 0–1)
Eosinophils Absolute: 0.1 10*3/uL (ref 0.0–0.7)
Eosinophils Relative: 1 % (ref 0–5)
HCT: 37.8 % (ref 36.0–46.0)
Hemoglobin: 13 g/dL (ref 12.0–15.0)
Lymphocytes Relative: 17 % (ref 12–46)
Lymphs Abs: 1.1 10*3/uL (ref 0.7–4.0)
MCH: 31.9 pg (ref 26.0–34.0)
MCHC: 34.4 g/dL (ref 30.0–36.0)
MCV: 92.6 fL (ref 78.0–100.0)
Monocytes Absolute: 0.5 10*3/uL (ref 0.1–1.0)
Monocytes Relative: 7 % (ref 3–12)
Neutro Abs: 4.9 10*3/uL (ref 1.7–7.7)
Neutrophils Relative %: 74 % (ref 43–77)
Platelets: 279 10*3/uL (ref 150–400)
RBC: 4.08 MIL/uL (ref 3.87–5.11)
RDW: 14.1 % (ref 11.5–15.5)
WBC: 6.6 10*3/uL (ref 4.0–10.5)

## 2012-02-14 LAB — COMPREHENSIVE METABOLIC PANEL
ALT: 17 U/L (ref 0–35)
AST: 23 U/L (ref 0–37)
Albumin: 4.5 g/dL (ref 3.5–5.2)
Alkaline Phosphatase: 65 U/L (ref 39–117)
BUN: 13 mg/dL (ref 6–23)
CO2: 28 mEq/L (ref 19–32)
Calcium: 10.4 mg/dL (ref 8.4–10.5)
Chloride: 104 mEq/L (ref 96–112)
Creat: 0.85 mg/dL (ref 0.50–1.10)
Glucose, Bld: 75 mg/dL (ref 70–99)
Potassium: 4.1 mEq/L (ref 3.5–5.3)
Sodium: 141 mEq/L (ref 135–145)
Total Bilirubin: 0.4 mg/dL (ref 0.3–1.2)
Total Protein: 7.8 g/dL (ref 6.0–8.3)

## 2012-02-14 MED ORDER — CIPROFLOXACIN HCL 750 MG PO TABS: 750.0000 mg | ORAL_TABLET | Freq: Two times a day (BID) | ORAL | Status: DC

## 2012-02-14 MED ORDER — CLINDAMYCIN HCL 300 MG PO CAPS: 300.0000 mg | ORAL_CAPSULE | Freq: Three times a day (TID) | ORAL | Status: DC

## 2012-02-14 NOTE — Patient Instructions (Addendum)
To have labs and CT scan  Call office for CT scan results

## 2012-02-14 NOTE — Progress Notes (Signed)
Subjective:    Patient ID: Suzanne Turner, female    DOB: 1945-06-20, 67 y.o.   MRN: [id number]  HPI Suzanne Turner is here with acute visit.  She describes a flu like illness 10-12 days ago and for the last 1 week has had swelling under R side of her jaw.  She does have a history of salivary gland stone on the same side.  She reports at least 3 episodes of salivary gland swelling in the past  No documented fever  No pain  She has tried lemon hard candy but it has not helped  Allergies  Allergen Reactions  . Medrol (Methylprednisolone)     swelling  . Penicillins Rash  . Sulfonamide Derivatives Rash   Past Medical History  Diagnosis Date  . Hypertension   . Osteoarthritis   . Kidney stone   . Bronchitis   . Osteopenia   . High cholesterol    Past Surgical History  Procedure Date  . Tonsillectomy 1949  . Tubal ligation 1977   History   Social History  . Marital Status: Widowed    Spouse Name: N/A    Number of Children: N/A  . Years of Education: N/A   Occupational History  . Not on file.   Social History Main Topics  . Smoking status: Current Every Day Smoker -- 0.5 packs/day for 40 years  . Smokeless tobacco: Never Used  . Alcohol Use: No  . Drug Use: No  . Sexually Active: Not Currently   Other Topics Concern  . Not on file   Social History Narrative  . No narrative on file   Family History  Problem Relation Age of Onset  . Colon cancer Mother   . Arrhythmia Mother   . Arrhythmia Brother   . Arrhythmia Maternal Uncle   . Arrhythmia Maternal Grandmother   . Breast cancer Sister   . Colon cancer Maternal Aunt    Patient Active Problem List  Diagnosis  . VITAMIN D DEFICIENCY  . HYPERTENSION  . CONSTIPATION  . OSTEOPENIA  . Palpitations  . Chest pain  . Smoking  . Hyperlipidemia  . Family history of colon cancer  . Family history of breast cancer in first degree relative  . History of renal stone  . Colon polyps  . Tremor   Current Outpatient  Prescriptions on File Prior to Visit  Medication Sig Dispense Refill  . aspirin EC 81 MG tablet Take 81 mg by mouth daily.        . calcium carbonate (CALCIUM 500) 1250 MG tablet Take 1 tablet by mouth daily.        . cholecalciferol (VITAMIN D) 1000 UNITS tablet Take 1,000 Units by mouth daily.        Marland Kitchen lisinopril (PRINIVIL,ZESTRIL) 40 MG tablet TAKE 1 TABLET BY MOUTH DAILY  30 tablet  3  . Melatonin 5 MG TABS Take 1 tablet by mouth at bedtime. As needed       . nystatin-triamcinolone ointment (MYCOLOG) Apply topically 3 times per day  30 g  1       Review of Systems    see HPI Objective:   Physical Exam Physical Exam  Nursing note and vitals reviewed.  Constitutional: She is oriented to person, place, and time. She appears well-developed and well-nourished.  HENT:  Head: Normocephalic and atraumatic. Neck   She has swollen and nontender R sided sublingual gland.  No edema of parotids or sublingual on L side Cardiovascular: Normal rate and  regular rhythm. Exam reveals no gallop and no friction rub.  No murmur heard.  Pulmonary/Chest: Breath sounds normal. She has no wheezes. She has no rales.  Neurological: She is alert and oriented to person, place, and time.  Skin: Skin is warm and dry.  Psychiatric: She has a normal mood and affect. Her behavior is normal.             Assessment & Plan:  Sublingual gland edema, recurrent  Will empirically cover with Cipro and Cleocin.  Check CBC and chemistries today   With history of stone will get CT soft tissue with contrast.  Depending on results, may need ENT referrral  Htn  Adequately controlled

## 2012-02-15 ENCOUNTER — Telehealth: Payer: Self-pay | Admitting: Internal Medicine

## 2012-02-15 ENCOUNTER — Encounter (HOSPITAL_BASED_OUTPATIENT_CLINIC_OR_DEPARTMENT_OTHER): Payer: Self-pay

## 2012-02-15 ENCOUNTER — Ambulatory Visit (HOSPITAL_BASED_OUTPATIENT_CLINIC_OR_DEPARTMENT_OTHER)
Admission: RE | Admit: 2012-02-15 | Discharge: 2012-02-15 | Disposition: A | Discharge status: Home / Self Care | Payer: Medicare Other | Source: Ambulatory Visit | Attending: Internal Medicine | Admitting: Internal Medicine

## 2012-02-15 DIAGNOSIS — K115 Sialolithiasis: Secondary | ICD-10-CM

## 2012-02-15 DIAGNOSIS — K111 Hypertrophy of salivary gland: Secondary | ICD-10-CM

## 2012-02-15 DIAGNOSIS — J438 Other emphysema: Secondary | ICD-10-CM | POA: Insufficient documentation

## 2012-02-15 MED ORDER — IOHEXOL 300 MG/ML  SOLN
76.0000 mL | Freq: Once | INTRAMUSCULAR | Status: AC | PRN
  Administered 2012-02-15: 76 mL via INTRAVENOUS

## 2012-02-15 NOTE — Telephone Encounter (Signed)
Per [name]: Suzanne Turner DOB [birth year] called to get lab results and CT results.  She was seen in the office January 22,2014.  Her phone number is  [phone]. Thanks [name]

## 2012-02-15 NOTE — Telephone Encounter (Signed)
Spoke with pt and informed of stone in sublingual gland  Will refer to ENT

## 2012-02-22 ENCOUNTER — Encounter: Payer: Self-pay | Admitting: *Deleted

## 2012-02-26 DIAGNOSIS — K115 Sialolithiasis: Secondary | ICD-10-CM | POA: Diagnosis not present

## 2012-03-06 ENCOUNTER — Ambulatory Visit: Payer: Medicare Other | Admitting: Cardiology

## 2012-03-20 ENCOUNTER — Other Ambulatory Visit: Payer: Self-pay | Admitting: Cardiology

## 2012-03-26 ENCOUNTER — Ambulatory Visit: Payer: Self-pay | Admitting: Cardiology

## 2012-04-01 DIAGNOSIS — H16109 Unspecified superficial keratitis, unspecified eye: Secondary | ICD-10-CM | POA: Diagnosis not present

## 2012-04-01 DIAGNOSIS — D3701 Neoplasm of uncertain behavior of lip: Secondary | ICD-10-CM | POA: Diagnosis not present

## 2012-04-01 DIAGNOSIS — D3705 Neoplasm of uncertain behavior of pharynx: Secondary | ICD-10-CM | POA: Diagnosis not present

## 2012-04-01 DIAGNOSIS — H04129 Dry eye syndrome of unspecified lacrimal gland: Secondary | ICD-10-CM | POA: Diagnosis not present

## 2012-04-01 DIAGNOSIS — H43819 Vitreous degeneration, unspecified eye: Secondary | ICD-10-CM | POA: Diagnosis not present

## 2012-04-01 DIAGNOSIS — H259 Unspecified age-related cataract: Secondary | ICD-10-CM | POA: Diagnosis not present

## 2012-04-17 ENCOUNTER — Ambulatory Visit (INDEPENDENT_AMBULATORY_CARE_PROVIDER_SITE_OTHER): Payer: Medicare Other | Admitting: Cardiology

## 2012-04-17 ENCOUNTER — Encounter: Payer: Self-pay | Admitting: Cardiology

## 2012-04-17 VITALS — BP 136/80 | HR 74 | Ht 67.0 in | Wt 134.0 lb

## 2012-04-17 DIAGNOSIS — F17200 Nicotine dependence, unspecified, uncomplicated: Secondary | ICD-10-CM

## 2012-04-17 DIAGNOSIS — R002 Palpitations: Secondary | ICD-10-CM

## 2012-04-17 NOTE — Patient Instructions (Addendum)
Continue your metoprolol before,during, and after your surgery.  Your physician wants you to follow-up in: 1 year with Dr Shirlee Latch. (March 2015). You will receive a reminder letter in the mail two months in advance. If you don't receive a letter, please call our office to schedule the follow-up appointment.

## 2012-04-18 NOTE — Progress Notes (Signed)
Patient ID: Suzanne Turner, female   DOB: August 23, 1945, 67 y.o.   MRN: [id number] PCP: Dr. Angelia Mould  67 yo with history of smoking, palpitations, and HTN returns for followup. Palpitations are minimal on Toprol XL.  She denies exertional dyspnea or chest pain.  No physical limitations.  She continues to smoke about a pack a day.  She is going to need surgery for sialolithiasis shortly under general anesthesai.    ECG: NSR, normal  Labs (1/13): K 3.6, creatinine 0.71, HCT 36.3 Labs (1/14): K 4.1, creatinine 0.85  PMH: 1. HTN 2. Hyperlipidemia 3. Osteoporosis 4. Nephrolithiasis 5. Palpitations: 3 week event monitor in 1/13 with no significant arrhythmic events.  6. Active smoker 7. Stress echo (12/12): Normal study 8. Sialolithiasis  SH: Smokes 1/2 ppd.  Lives in [place], retired, married.   FH: She thinks that her grandmother, brother, and mother all had atrial fibrillation.   Current Outpatient Prescriptions  Medication Sig Dispense Refill  . aspirin EC 81 MG tablet Take 81 mg by mouth daily.        . calcium carbonate (CALCIUM 500) 1250 MG tablet Take 1 tablet by mouth daily.        . cholecalciferol (VITAMIN D) 1000 UNITS tablet Take 1,000 Units by mouth daily.        Marland Kitchen lisinopril (PRINIVIL,ZESTRIL) 40 MG tablet TAKE 1 TABLET BY MOUTH EVERY DAY  30 tablet  3  . metoprolol succinate (TOPROL-XL) 25 MG 24 hr tablet Take 25 mg by mouth daily.      . Multiple Vitamin (STRESS FORMULA 500/BIOTIN PO) Take 1 tablet by mouth 3 (three) times daily.      Marland Kitchen nystatin-triamcinolone ointment (MYCOLOG) Apply topically 3 times per day  30 g  1   No current facility-administered medications for this visit.    BP 136/80  Pulse 74  Ht 5\' 7"  (1.702 m)  Wt 134 lb (60.782 kg)  BMI 20.98 kg/m2 General: NAD Neck: No JVD, no thyromegaly or thyroid nodule.  Lungs: Clear to auscultation bilaterally with normal respiratory effort. CV: Nondisplaced PMI.  Heart regular S1/S2, no S3/S4, no murmur.   No peripheral edema.  No carotid bruit.  Normal pedal pulses.  Abdomen: Soft, nontender, no hepatosplenomegaly, no distention.  Neurologic: Alert and oriented x 3.  Psych: Normal affect. Extremities: No clubbing or cyanosis.   Assessment/Plan: 1. Palpitations: Resolved on Toprol XL.   2. Smoking: I strongly encouraged the patient to quit.  3. Preoperative evaluation: [name] has good exercise tolerance, no chest pain.  I think she can undergo surgery for sialolithiasis with no furthe cardiac testing.  Continue Toprol XL peri-operatively.   Marca Ancona 04/18/2012

## 2012-04-30 ENCOUNTER — Encounter (HOSPITAL_COMMUNITY): Payer: Self-pay | Admitting: Pharmacy Technician

## 2012-05-01 NOTE — H&P (Signed)
Assessment   Sialolithiasis (527.5) (K11.5). Discussed  Recurring sialadenitis secondary to a stone at the hilum of the right submandibular gland. Recommend attempted intraoral retrieval of the stone, and then if that is not successful excision of the gland. She will think about his options and will let us know which way she wants to go. Reason For Visit  Salivary stone. HPI  History of 4 episodes of right submandibular sialadenitis and obstruction. Recent CT reveals a stone in the hilum of the gland. She is otherwise in pretty good health. Allergies  Penicillins Sulfa Drugs. Current Meds  Aspirin Low Dose 81 MG Oral Tablet;; RPT Toprol XL 25 MG Oral Tablet Extended Release 24 Hour (Metoprolol Succinate ER);; RPT Lisinopril TABS;; RPT Biotin CAPS;; RPT. Active Problems  Eczema  (692.9) (L30.9) Hypertension  (401.9) (I10). PSH  Oral Surgery Tooth Extraction Tonsillectomy Tubal Ligation. Family Hx  Family history of colon cancer: Mother (V16.0) (Z80.0) Rapid heartbeat (R00.0). Personal Hx  No alcohol use No caffeine use Smoker (305.1) (F17.200). ROS  Systemic: Not feeling tired (fatigue).  No fever, no night sweats, and no recent weight loss. Head: No headache. Eyes: No eye symptoms. Otolaryngeal: No hearing loss, no earache, no tinnitus, and no purulent nasal discharge.  No nasal passage blockage (stuffiness), no snoring, no sneezing, no hoarseness, and no sore throat. Cardiovascular: No chest pain or discomfort  and no palpitations. Pulmonary: No dyspnea, no cough, and no wheezing. Gastrointestinal: No dysphagia  and no heartburn.  No nausea, no abdominal pain, and no melena.  No diarrhea. Genitourinary: No dysuria. Endocrine: No muscle weakness. Musculoskeletal: No calf muscle cramps, no arthralgias, and no soft tissue swelling. Neurological: No dizziness, no fainting, no tingling, and no numbness. Psychological: No anxiety  and no depression. Skin: No rash. 12 system ROS  was obtained and reviewed on the Health Maintenance form dated today.  Positive responses are shown above.  If the symptom is not checked, the patient has denied it. Vital Signs   Recorded by Skolimowski,Sharon on 26 Feb 2012 02:30 PM BP:130/78,  Height: 67 in, Weight: 132 lb, BMI: 20.7 kg/m2,  BMI Calculated: 20.67 ,  BSA Calculated: 1.69. Physical Exam  APPEARANCE: Well developed, well nourished, in no acute distress.  Normal affect, in a pleasant mood.  Oriented to time, place and person. COMMUNICATION: Normal voice   HEAD & FACE:  No scars, lesions or masses of head and face.  Sinuses nontender to palpation.  Salivary glands without mass or tenderness.  Facial strength symmetric.  No facial lesion, scars, or mass. EYES: EOMI with normal primary gaze alignment. Visual acuity grossly intact.  PERRLA EXTERNAL EAR & NOSE: No scars, lesions or masses  EAC & TYMPANIC MEMBRANE:  EAC shows no obstructing lesions or debris and tympanic membranes are normal bilaterally with good movement to insufflation. GROSS HEARING: Normal   TMJ:  Nontender  INTRANASAL EXAM: No polyps or purulence.  NASOPHARYNX: Normal, without lesions. LIPS, TEETH & GUMS: No lip lesions, normal dentition and normal gums. ORAL CAVITY/OROPHARYNX:  Oral mucosa moist without lesion or asymmetry of the palate, tongue, tonsil or posterior pharynx. NECK:  Supple without adenopathy or mass. THYROID:  Normal with no masses palpable.  NEUROLOGIC:  No gross CN deficits. No nystagmus noted.   LYMPHATIC:  No enlarged nodes palpable. Signature  Electronically signed by : Serena Colonel  M.D.; 02/26/2012 2:34 PM EST.

## 2012-05-02 NOTE — Pre-Procedure Instructions (Signed)
Suzanne Turner  05/02/2012   Your procedure is scheduled on:  Wednesday, April 23rd  Report to [facility] at 0630 AM.  Call this number if you have problems the morning of surgery: [phone]   Remember:   Do not eat food or drink liquids after midnight.    Take these medicines the morning of surgery with A SIP OF WATER: Toprol, tylenol if needed   Do not wear jewelry, make-up or nail polish.  Do not wear lotions, powders, or perfumes, deodorant.  Do not shave 48 hours prior to surgery.  Do not bring valuables to the hospital.  Contacts, dentures or bridgework may not be worn into surgery.  Leave suitcase in the car. After surgery it may be brought to your room.  For patients admitted to the hospital, checkout time is 11:00 AM the day of  discharge.   Patients discharged the day of surgery will not be allowed to drive home.    Special Instructions: Shower using CHG 2 nights before surgery and the night before surgery.  If you shower the day of surgery use CHG.  Use special wash - you have one bottle of CHG for all showers.  You should use approximately 1/3 of the bottle for each shower.   Please read over the following fact sheets that you were given: Pain Booklet, Coughing and Deep Breathing, MRSA Information and Surgical Site Infection Prevention

## 2012-05-03 ENCOUNTER — Encounter (HOSPITAL_COMMUNITY)
Admission: RE | Admit: 2012-05-03 | Discharge: 2012-05-03 | Disposition: A | Discharge status: Home / Self Care | Payer: Medicare Other | Source: Ambulatory Visit | Attending: Otolaryngology | Admitting: Otolaryngology

## 2012-05-03 ENCOUNTER — Encounter (HOSPITAL_COMMUNITY): Payer: Self-pay

## 2012-05-03 ENCOUNTER — Ambulatory Visit (HOSPITAL_COMMUNITY)
Admission: RE | Admit: 2012-05-03 | Discharge: 2012-05-03 | Disposition: A | Discharge status: Home / Self Care | Payer: Medicare Other | Source: Ambulatory Visit | Attending: Anesthesiology | Admitting: Anesthesiology

## 2012-05-03 DIAGNOSIS — I10 Essential (primary) hypertension: Secondary | ICD-10-CM | POA: Diagnosis not present

## 2012-05-03 DIAGNOSIS — Z01812 Encounter for preprocedural laboratory examination: Secondary | ICD-10-CM | POA: Diagnosis not present

## 2012-05-03 DIAGNOSIS — Z01818 Encounter for other preprocedural examination: Secondary | ICD-10-CM | POA: Diagnosis not present

## 2012-05-03 DIAGNOSIS — K115 Sialolithiasis: Secondary | ICD-10-CM | POA: Diagnosis not present

## 2012-05-03 HISTORY — DX: Dermatitis, unspecified: L30.9

## 2012-05-03 LAB — CBC
HCT: 36.9 % (ref 36.0–46.0)
Hemoglobin: 12.9 g/dL (ref 12.0–15.0)
MCH: 31.5 pg (ref 26.0–34.0)
MCHC: 35 g/dL (ref 30.0–36.0)
MCV: 90.2 fL (ref 78.0–100.0)
Platelets: 207 10*3/uL (ref 150–400)
RBC: 4.09 MIL/uL (ref 3.87–5.11)
RDW: 13.4 % (ref 11.5–15.5)
WBC: 5.1 10*3/uL (ref 4.0–10.5)

## 2012-05-03 LAB — BASIC METABOLIC PANEL
BUN: 13 mg/dL (ref 6–23)
CO2: 28 mEq/L (ref 19–32)
Calcium: 9.9 mg/dL (ref 8.4–10.5)
Chloride: 104 mEq/L (ref 96–112)
Creatinine, Ser: 0.86 mg/dL (ref 0.50–1.10)
GFR calc Af Amer: 79 mL/min — ABNORMAL LOW (ref >90)
GFR calc non Af Amer: 68 mL/min — ABNORMAL LOW (ref >90)
Glucose, Bld: 78 mg/dL (ref 70–99)
Potassium: 3.8 mEq/L (ref 3.5–5.1)
Sodium: 140 mEq/L (ref 135–145)

## 2012-05-03 LAB — SURGICAL PCR SCREEN
MRSA, PCR: NEGATIVE
Staphylococcus aureus: NEGATIVE

## 2012-05-14 MED ORDER — VANCOMYCIN HCL IN DEXTROSE 1-5 GM/200ML-% IV SOLN
1000.0000 mg | INTRAVENOUS | Status: AC
  Administered 2012-05-15: 1000 mg via INTRAVENOUS
  Filled 2012-05-14: qty 200

## 2012-05-15 ENCOUNTER — Observation Stay (HOSPITAL_COMMUNITY)
Admission: RE | Admit: 2012-05-15 | Discharge: 2012-05-15 | Disposition: A | Discharge status: Home / Self Care | Payer: Medicare Other | Source: Ambulatory Visit | Attending: Otolaryngology | Admitting: Otolaryngology

## 2012-05-15 ENCOUNTER — Encounter (HOSPITAL_COMMUNITY): Payer: Self-pay | Admitting: Anesthesiology

## 2012-05-15 ENCOUNTER — Encounter (HOSPITAL_COMMUNITY)
Admission: RE | Disposition: A | Discharge status: Home / Self Care | Payer: Self-pay | Source: Ambulatory Visit | Attending: Otolaryngology

## 2012-05-15 ENCOUNTER — Encounter (HOSPITAL_COMMUNITY): Payer: Self-pay | Admitting: *Deleted

## 2012-05-15 ENCOUNTER — Ambulatory Visit (HOSPITAL_COMMUNITY): Payer: Medicare Other | Admitting: Anesthesiology

## 2012-05-15 DIAGNOSIS — K115 Sialolithiasis: Secondary | ICD-10-CM | POA: Diagnosis not present

## 2012-05-15 DIAGNOSIS — R599 Enlarged lymph nodes, unspecified: Secondary | ICD-10-CM | POA: Diagnosis not present

## 2012-05-15 DIAGNOSIS — K1120 Sialoadenitis, unspecified: Secondary | ICD-10-CM | POA: Insufficient documentation

## 2012-05-15 DIAGNOSIS — I10 Essential (primary) hypertension: Secondary | ICD-10-CM | POA: Diagnosis not present

## 2012-05-15 HISTORY — PX: SALIVARY STONE REMOVAL: SHX5213

## 2012-05-15 SURGERY — REMOVAL, CALCULUS, SALIVARY DUCT
Anesthesia: General | Site: Mouth | Laterality: Right | Wound class: Clean

## 2012-05-15 MED ORDER — IBUPROFEN 100 MG/5ML PO SUSP
400.0000 mg | Freq: Four times a day (QID) | ORAL | Status: DC | PRN
  Filled 2012-05-15: qty 20

## 2012-05-15 MED ORDER — ROCURONIUM BROMIDE 100 MG/10ML IV SOLN
INTRAVENOUS | Status: DC | PRN
  Administered 2012-05-15: 25 mg via INTRAVENOUS

## 2012-05-15 MED ORDER — GLYCOPYRROLATE 0.2 MG/ML IJ SOLN
INTRAMUSCULAR | Status: DC | PRN
  Administered 2012-05-15: 0.4 mg via INTRAVENOUS

## 2012-05-15 MED ORDER — CALCIUM CARBONATE 600 MG PO TABS: 600.0000 mg | ORAL_TABLET | Freq: Two times a day (BID) | ORAL | Status: DC

## 2012-05-15 MED ORDER — HYDROCODONE-ACETAMINOPHEN 7.5-325 MG PO TABS: 1.0000 | ORAL_TABLET | Freq: Four times a day (QID) | ORAL | Status: DC | PRN

## 2012-05-15 MED ORDER — 0.9 % SODIUM CHLORIDE (POUR BTL) OPTIME
TOPICAL | Status: DC | PRN
  Administered 2012-05-15: 1000 mL

## 2012-05-15 MED ORDER — ARTIFICIAL TEARS OP OINT
TOPICAL_OINTMENT | OPHTHALMIC | Status: DC | PRN
  Administered 2012-05-15: 1 via OPHTHALMIC

## 2012-05-15 MED ORDER — CALCIUM CARBONATE 1250 (500 CA) MG PO TABS: 1.0000 | ORAL_TABLET | Freq: Two times a day (BID) | ORAL | Status: DC

## 2012-05-15 MED ORDER — ONDANSETRON HCL 4 MG/2ML IJ SOLN
INTRAMUSCULAR | Status: DC | PRN
  Administered 2012-05-15: 4 mg via INTRAVENOUS

## 2012-05-15 MED ORDER — LISINOPRIL 40 MG PO TABS
40.0000 mg | ORAL_TABLET | Freq: Every day | ORAL | Status: DC
  Filled 2012-05-15: qty 1

## 2012-05-15 MED ORDER — PROPOFOL 10 MG/ML IV BOLUS
INTRAVENOUS | Status: DC | PRN
  Administered 2012-05-15: 175 mg via INTRAVENOUS

## 2012-05-15 MED ORDER — LACTATED RINGERS IV SOLN
INTRAVENOUS | Status: DC | PRN
  Administered 2012-05-15 (×2): via INTRAVENOUS

## 2012-05-15 MED ORDER — PROMETHAZINE HCL 25 MG PO TABS: 25.0000 mg | ORAL_TABLET | Freq: Four times a day (QID) | ORAL | Status: DC | PRN

## 2012-05-15 MED ORDER — FENTANYL CITRATE 0.05 MG/ML IJ SOLN
INTRAMUSCULAR | Status: DC | PRN
  Administered 2012-05-15 (×4): 50 ug via INTRAVENOUS

## 2012-05-15 MED ORDER — ONDANSETRON HCL 4 MG/2ML IJ SOLN: 4.0000 mg | Freq: Four times a day (QID) | INTRAMUSCULAR | Status: DC | PRN

## 2012-05-15 MED ORDER — PROMETHAZINE HCL 25 MG RE SUPP: 25.0000 mg | Freq: Four times a day (QID) | RECTAL | Status: DC | PRN

## 2012-05-15 MED ORDER — OXYCODONE HCL 5 MG/5ML PO SOLN: 5.0000 mg | Freq: Once | ORAL | Status: DC | PRN

## 2012-05-15 MED ORDER — MIDAZOLAM HCL 5 MG/5ML IJ SOLN
INTRAMUSCULAR | Status: DC | PRN
  Administered 2012-05-15 (×2): 1 mg via INTRAVENOUS

## 2012-05-15 MED ORDER — EPHEDRINE SULFATE 50 MG/ML IJ SOLN
INTRAMUSCULAR | Status: DC | PRN
  Administered 2012-05-15 (×4): 10 mg via INTRAVENOUS

## 2012-05-15 MED ORDER — METOPROLOL SUCCINATE ER 25 MG PO TB24: 25.0000 mg | ORAL_TABLET | Freq: Every day | ORAL | Status: DC

## 2012-05-15 MED ORDER — ASPIRIN EC 81 MG PO TBEC: 81.0000 mg | DELAYED_RELEASE_TABLET | Freq: Every day | ORAL | Status: DC

## 2012-05-15 MED ORDER — LIDOCAINE-EPINEPHRINE 1 %-1:100000 IJ SOLN
INTRAMUSCULAR | Status: DC | PRN
  Administered 2012-05-15: 10 mL

## 2012-05-15 MED ORDER — OXYCODONE HCL 5 MG PO TABS: 5.0000 mg | ORAL_TABLET | Freq: Once | ORAL | Status: DC | PRN

## 2012-05-15 MED ORDER — LIDOCAINE HCL 4 % MT SOLN
OROMUCOSAL | Status: DC | PRN
  Administered 2012-05-15: 4 mL via TOPICAL

## 2012-05-15 MED ORDER — FENTANYL CITRATE 0.05 MG/ML IJ SOLN: 25.0000 ug | INTRAMUSCULAR | Status: DC | PRN

## 2012-05-15 MED ORDER — NEOSTIGMINE METHYLSULFATE 1 MG/ML IJ SOLN
INTRAMUSCULAR | Status: DC | PRN
  Administered 2012-05-15: 3 mg via INTRAVENOUS

## 2012-05-15 MED ORDER — LIDOCAINE-EPINEPHRINE 1 %-1:100000 IJ SOLN
INTRAMUSCULAR | Status: AC
  Filled 2012-05-15: qty 1

## 2012-05-15 MED ORDER — DEXTROSE-NACL 5-0.9 % IV SOLN
INTRAVENOUS | Status: DC
  Administered 2012-05-15: 12:00:00 via INTRAVENOUS

## 2012-05-15 MED ORDER — HYDROCODONE-ACETAMINOPHEN 5-325 MG PO TABS: 1.0000 | ORAL_TABLET | ORAL | Status: DC | PRN

## 2012-05-15 MED ORDER — LIDOCAINE HCL (CARDIAC) 20 MG/ML IV SOLN
INTRAVENOUS | Status: DC | PRN
  Administered 2012-05-15: 60 mg via INTRAVENOUS

## 2012-05-15 SURGICAL SUPPLY — 35 items
ADH SKN CLS APL DERMABOND .7 (GAUZE/BANDAGES/DRESSINGS) ×2
CANISTER SUCTION 2500CC (MISCELLANEOUS) ×2 IMPLANT
CLEANER TIP ELECTROSURG 2X2 (MISCELLANEOUS) ×4 IMPLANT
CLOTH BEACON ORANGE TIMEOUT ST (SAFETY) ×2 IMPLANT
CORDS BIPOLAR (ELECTRODE) ×1 IMPLANT
COVER MAYO STAND STRL (DRAPES) ×4 IMPLANT
COVER SURGICAL LIGHT HANDLE (MISCELLANEOUS) ×3 IMPLANT
COVER TABLE BACK 60X90 (DRAPES) ×2 IMPLANT
DERMABOND ADVANCED (GAUZE/BANDAGES/DRESSINGS) ×2
DERMABOND ADVANCED .7 DNX12 (GAUZE/BANDAGES/DRESSINGS) IMPLANT
DRAIN CHANNEL 7F 3/4 FLAT (WOUND CARE) ×1 IMPLANT
DRAPE ORTHO SPLIT 77X108 STRL (DRAPES) ×2
DRAPE PROXIMA HALF (DRAPES) ×1 IMPLANT
DRAPE SURG ORHT 6 SPLT 77X108 (DRAPES) IMPLANT
ELECT COATED BLADE 2.86 ST (ELECTRODE) ×3 IMPLANT
ELECT REM PT RETURN 9FT ADLT (ELECTROSURGICAL) ×2
ELECTRODE REM PT RTRN 9FT ADLT (ELECTROSURGICAL) ×1 IMPLANT
EVACUATOR SILICONE 100CC (DRAIN) ×1 IMPLANT
GLOVE ECLIPSE 7.5 STRL STRAW (GLOVE) ×7 IMPLANT
GLOVE SURG SS PI 6.5 STRL IVOR (GLOVE) ×2 IMPLANT
GLOVE SURG SS PI 7.0 STRL IVOR (GLOVE) ×1 IMPLANT
GOWN STRL NON-REIN LRG LVL3 (GOWN DISPOSABLE) ×8 IMPLANT
KIT BASIN OR (CUSTOM PROCEDURE TRAY) ×2 IMPLANT
KIT ROOM TURNOVER OR (KITS) ×2 IMPLANT
MARKER SKIN DUAL TIP RULER LAB (MISCELLANEOUS) ×1 IMPLANT
NEEDLE 22X1 1/2 (OR ONLY) (NEEDLE) ×1 IMPLANT
NS IRRIG 1000ML POUR BTL (IV SOLUTION) ×2 IMPLANT
PAD ARMBOARD 7.5X6 YLW CONV (MISCELLANEOUS) ×3 IMPLANT
PENCIL FOOT CONTROL (ELECTRODE) ×2 IMPLANT
SUT CHROMIC 3 0 PS 2 (SUTURE) ×2 IMPLANT
SUT ETHILON 3 0 PS 1 (SUTURE) ×1 IMPLANT
SUT SILK 4 0 REEL (SUTURE) ×1 IMPLANT
SYR BULB IRRIGATION 50ML (SYRINGE) ×1 IMPLANT
TOWEL OR 17X26 10 PK STRL BLUE (TOWEL DISPOSABLE) ×2 IMPLANT
TRAY ENT MC OR (CUSTOM PROCEDURE TRAY) ×2 IMPLANT

## 2012-05-15 NOTE — Transfer of Care (Signed)
Immediate Anesthesia Transfer of Care Note  Patient: Suzanne Turner  Procedure(s) Performed: Procedure(s): EXCISION OF RIGHT SUBMANDIBULAR GLAND  (Right)  Patient Location: PACU  Anesthesia Type:General  Level of Consciousness: awake, alert  and oriented  Airway & Oxygen Therapy: Patient Spontanous Breathing and Patient connected to nasal cannula oxygen  Post-op Assessment: Report given to PACU RN, Post -op Vital signs reviewed and stable and Patient moving all extremities X 4  Post vital signs: Reviewed and stable  Complications: No apparent anesthesia complications

## 2012-05-15 NOTE — Interval H&P Note (Signed)
History and Physical Interval Note:  05/15/2012 8:26 AM  Suzanne Turner  has presented today for surgery, with the diagnosis of RIGHT SUBMANDIBULAR GLAND STONE   The various methods of treatment have been discussed with the patient and family. After consideration of risks, benefits and other options for treatment, the patient has consented to  Procedure(s): RIGHT SUBMANDIBULAR STONE REMOVAL INTRAORAL POSSIBLE EXCISION OF RIGHT SUBMANDIBULAR GLAND  (Right) as a surgical intervention .  The patient's history has been reviewed, patient examined, no change in status, stable for surgery.  I have reviewed the patient's chart and labs.  Questions were answered to the patient's satisfaction.     Dshawn Mcnay

## 2012-05-15 NOTE — Op Note (Signed)
OPERATIVE REPORT  DATE OF SURGERY: 05/15/2012  PATIENT:  Suzanne Turner,  67 y.o. female  PRE-OPERATIVE DIAGNOSIS:  RIGHT SUBMANDIBULAR GLAND STONE   POST-OPERATIVE DIAGNOSIS:  RIGHT SUBMANDIBULAR GLAND STONE   PROCEDURE:  Procedure(s): EXCISION OF RIGHT SUBMANDIBULAR GLAND   SURGEON:  Susy Frizzle, MD  ASSISTANTS: Aquilla Hacker, PA  ANESTHESIA:   General   EBL:  10 ml  DRAINS: 7 [place] JP  LOCAL MEDICATIONS USED:  None  SPECIMEN:  Right submandibular gland  COUNTS:  Correct  PROCEDURE DETAILS: The patient was taken to the operating room and placed on the operating table in the supine position. Following induction of general endotracheal anesthesia the mouth was draped in a standard fashion. The right submandibular duct orifice was identified and probed with lacrimal probes and dilated up to a #4. Electrocautery was used to incise the floor of mouth mucosa and down through the duct. A long nasal speculum was used to inspect the length of the duct as far as I could see. Simultaneous palpation of the gland was used as well. With bimanual palpation I was able to identify a small calculus. I was able to retrieve a very small fragment of calculus but was unable to identify the major calculus. At this point, decision was made to remove the gland.  The right neck was prepped and draped in a standard fashion. A transverse incision was outlined about 4 cm below the angle of the mandible in a skin crease and was incised using electrocautery down to the distal layer. Subplatysmal flap was developed superiorly to the mandible. The marginal branch of the facial nerve was identified and reflected superiorly. The facial vessels were identified and ligated between clamps and divided. 4-0 silk ties were used. The gland was identified and reflected inferiorly. Careful dissection revealed the mylohyoid muscle which was then reflected superiorly. The submandibular ganglion was identified and resected  preserving the lingual nerve. The duct was dilated and and there is fibrosis and inflammatory tissue surrounding it. This was ligated between clamps and divided. The facial artery was similarly ligated and divided. The gland was dissected off of the digastric muscle and removed and sent for pathologic evaluation. Inspection of the gland revealed enlargement, chronic fibrotic changes and some inflammatory swelling but no stone was identified. The ductal area was then bimanually palpated through the neck and through the mouth wound and no additional calculus was palpated. The wound was irrigated with saline. A drain was left in the wound and exited through the posterior aspect of the incision and secured in place with a nylon suture. The platysma layer was reapproximated with interrupted 3-0 chromic suture. Subcuticular closure was also accomplished with 3-0 chromic suture and Dermabond was used on the skin. Patient was awakened extubated and transferred to recovery in stable condition.    PATIENT DISPOSITION:  To PACU, stable

## 2012-05-15 NOTE — Anesthesia Preprocedure Evaluation (Signed)
Anesthesia Evaluation  Patient identified by MRN, date of birth, ID band Patient awake    Reviewed: Allergy & Precautions, H&P , NPO status , Patient's Chart, lab work & pertinent test results  Airway Mallampati: II  Neck ROM: full    Dental   Pulmonary Current Smoker,          Cardiovascular hypertension,     Neuro/Psych    GI/Hepatic   Endo/Other    Renal/GU      Musculoskeletal  (+) Arthritis -, Osteoarthritis,    Abdominal   Peds  Hematology   Anesthesia Other Findings   Reproductive/Obstetrics                           Anesthesia Physical Anesthesia Plan  ASA: II  Anesthesia Plan: General   Post-op Pain Management:    Induction: Intravenous  Airway Management Planned: Oral ETT  Additional Equipment:   Intra-op Plan:   Post-operative Plan: Extubation in OR  Informed Consent: I have reviewed the patients History and Physical, chart, labs and discussed the procedure including the risks, benefits and alternatives for the proposed anesthesia with the patient or authorized representative who has indicated his/her understanding and acceptance.     Plan Discussed with: CRNA and Surgeon  Anesthesia Plan Comments:         Anesthesia Quick Evaluation

## 2012-05-15 NOTE — Preoperative (Signed)
Beta Blockers   Reason not to administer Beta Blockers:Not Applicable 

## 2012-05-15 NOTE — Progress Notes (Signed)
Discharge instructions given to patient and daughter.  Patient and daughter shown how to empty JP drain and record output.  Return demonstration performed successfully.   All questions answered.  Prescriptions given to patient.  Patient discharged home with daughter.

## 2012-05-15 NOTE — Progress Notes (Signed)
Patient received from PACU.  Alert and oriented x4.  Vitals stable.  Denies pain.  Incision to right neck with dermabond and a JP drain intact.  Eczema noted to BUE and BLE.  Patient oriented to room and unit.  Will continue to monitor.

## 2012-05-15 NOTE — Anesthesia Postprocedure Evaluation (Signed)
Anesthesia Post Note  Patient: Suzanne Turner  Procedure(s) Performed: Procedure(s) (LRB): EXCISION OF RIGHT SUBMANDIBULAR GLAND  (Right)  Anesthesia type: General  Patient location: PACU  Post pain: Pain level controlled and Adequate analgesia  Post assessment: Post-op Vital signs reviewed, Patient's Cardiovascular Status Stable, Respiratory Function Stable, Patent Airway and Pain level controlled  Last Vitals:  Filed Vitals:   05/15/12 1134  BP: 130/72  Pulse: 67  Temp:   Resp: 15    Post vital signs: Reviewed and stable  Level of consciousness: awake, alert  and oriented  Complications: No apparent anesthesia complications

## 2012-05-15 NOTE — Anesthesia Procedure Notes (Signed)
Procedure Name: Intubation Date/Time: 05/15/2012 8:47 AM Performed by: Gayla Medicus Pre-anesthesia Checklist: Timeout performed, Patient identified, Emergency Drugs available, Suction available and Patient being monitored Patient Re-evaluated:Patient Re-evaluated prior to inductionOxygen Delivery Method: Circle system utilized Preoxygenation: Pre-oxygenation with 100% oxygen Intubation Type: IV induction Ventilation: Mask ventilation without difficulty Laryngoscope Size: Mac and 3 Grade View: Grade II Tube type: Oral Tube size: 7.0 mm Number of attempts: 2 Airway Equipment and Method: Stylet and LTA kit utilized Placement Confirmation: ETT inserted through vocal cords under direct vision,  positive ETCO2 and breath sounds checked- equal and bilateral Secured at: 21 cm Tube secured with: Tape Dental Injury: Teeth and Oropharynx as per pre-operative assessment

## 2012-05-16 ENCOUNTER — Encounter (HOSPITAL_COMMUNITY): Payer: Self-pay | Admitting: Otolaryngology

## 2012-05-16 NOTE — Discharge Summary (Signed)
Physician Discharge Summary  Patient ID: Suzanne Turner MRN: [id number] DOB/AGE: 1945/12/23 67 y.o.  Admit date: 05/15/2012 Discharge date: 05/16/2012  Admission Diagnoses:Sialolithiasis  Discharge Diagnoses:  Active Problems:   * No active hospital problems. *   Discharged Condition: good  Hospital Course: No complications  Consults: none  Significant Diagnostic Studies: none  Treatments: surgery: Right submandibular gland removal  Discharge Exam: Blood pressure 130/72, pulse 67, temperature 97.5 F (36.4 C), temperature source Oral, resp. rate 15, height 5\' 7"  (1.702 m), SpO2 98.00%. PHYSICAL EXAM: Suture line looks healthy, drain intact. No swelling. Lip has normal movement.  Disposition: 01-Home or Self Care  Discharge Orders   Future Orders Complete By Expires     Diet - low sodium heart healthy  As directed     Increase activity slowly  As directed         Medication List    TAKE these medications       acetaminophen 325 MG tablet  Commonly known as:  TYLENOL  Take 325 mg by mouth daily as needed for pain.     aspirin EC 81 MG tablet  Take 81 mg by mouth daily.     calcium carbonate 600 MG Tabs  Commonly known as:  OS-CAL  Take 600 mg by mouth 2 (two) times daily with a meal.     HAIR/SKIN/NAILS/BIOTIN PO  Take 500 mg by mouth 3 (three) times daily.     HYDROcodone-acetaminophen 7.5-325 MG per tablet  Commonly known as:  NORCO  Take 1 tablet by mouth every 6 (six) hours as needed for pain.     lisinopril 40 MG tablet  Commonly known as:  PRINIVIL,ZESTRIL  Take 40 mg by mouth daily.     metoprolol succinate 25 MG 24 hr tablet  Commonly known as:  TOPROL-XL  Take 25 mg by mouth daily.     promethazine 25 MG suppository  Commonly known as:  PHENERGAN  Place 1 suppository (25 mg total) rectally every 6 (six) hours as needed for nausea.           Follow-up Information   Call Serena Colonel, MD.   Contact information:   [address], [address] [place] [address] [phone]       Signed: Serena Colonel 05/16/2012, 8:06 AM

## 2012-06-10 ENCOUNTER — Other Ambulatory Visit: Payer: Self-pay | Admitting: Cardiology

## 2012-06-20 ENCOUNTER — Other Ambulatory Visit: Payer: Self-pay | Admitting: *Deleted

## 2012-06-20 MED ORDER — LISINOPRIL 40 MG PO TABS: 40.0000 mg | ORAL_TABLET | Freq: Every day | ORAL | Status: DC

## 2012-08-26 DIAGNOSIS — K115 Sialolithiasis: Secondary | ICD-10-CM | POA: Diagnosis not present

## 2012-09-10 DIAGNOSIS — K115 Sialolithiasis: Secondary | ICD-10-CM | POA: Diagnosis not present

## 2012-10-28 DIAGNOSIS — K115 Sialolithiasis: Secondary | ICD-10-CM | POA: Diagnosis not present

## 2012-11-12 ENCOUNTER — Encounter (HOSPITAL_BASED_OUTPATIENT_CLINIC_OR_DEPARTMENT_OTHER): Payer: Self-pay | Admitting: *Deleted

## 2012-11-12 NOTE — Progress Notes (Signed)
Pt had a cardiac work up 3/14 prior to surgery 4/14-did well-she wants as little sedation for this as possible To come in for bmet-ekg was done 3/14

## 2012-11-14 ENCOUNTER — Encounter (HOSPITAL_BASED_OUTPATIENT_CLINIC_OR_DEPARTMENT_OTHER)
Admission: RE | Admit: 2012-11-14 | Discharge: 2012-11-14 | Disposition: A | Discharge status: Home / Self Care | Payer: Medicare Other | Source: Ambulatory Visit | Attending: Internal Medicine | Admitting: Internal Medicine

## 2012-11-14 DIAGNOSIS — K1120 Sialoadenitis, unspecified: Secondary | ICD-10-CM | POA: Diagnosis not present

## 2012-11-14 DIAGNOSIS — Z01812 Encounter for preprocedural laboratory examination: Secondary | ICD-10-CM | POA: Diagnosis not present

## 2012-11-14 DIAGNOSIS — I10 Essential (primary) hypertension: Secondary | ICD-10-CM | POA: Diagnosis not present

## 2012-11-14 DIAGNOSIS — L259 Unspecified contact dermatitis, unspecified cause: Secondary | ICD-10-CM | POA: Diagnosis not present

## 2012-11-14 LAB — BASIC METABOLIC PANEL
BUN: 12 mg/dL (ref 6–23)
CO2: 29 mEq/L (ref 19–32)
Calcium: 9.8 mg/dL (ref 8.4–10.5)
Chloride: 103 mEq/L (ref 96–112)
Creatinine, Ser: 0.86 mg/dL (ref 0.50–1.10)
GFR calc Af Amer: 79 mL/min — ABNORMAL LOW (ref >90)
GFR calc non Af Amer: 68 mL/min — ABNORMAL LOW (ref >90)
Glucose, Bld: 79 mg/dL (ref 70–99)
Potassium: 4.1 mEq/L (ref 3.5–5.1)
Sodium: 140 mEq/L (ref 135–145)

## 2012-11-15 NOTE — H&P (Signed)
Assessment  Sialolithiasis (527.5) (K11.5). Discussed  She has continued having recurring infections and taking antibiotics. She is interested in surgical treatment. On exam, the neck is soft and free of any abnormalities. In the posterior floor of mouth there is a firm slightly tender area which I suspect is where the stone is. I have reviewed the CT again. I believe this is a stone that can be extracted intraorally with sedation and local anesthesia. We will schedule at her convenience. Reason For Visit  Discuss salivary stone surgery. Allergies  Penicillins predniSONE Sulfa Drugs. Current Meds  Aspirin Low Dose 81 MG Oral Tablet;; RPT Toprol XL 25 MG Oral Tablet Extended Release 24 Hour (Metoprolol Succinate ER);; RPT Lisinopril TABS;; RPT Biotin CAPS;; RPT. Active Problems  Eczema   (692.9) (L30.9) Hypertension   (401.9) (I10) Sialolithiasis   (527.5) (K11.5). PSH  Oral Surgery Tooth Extraction Surgery Excision Of Submandibular (Submaxillary) Gland 23Apr2014 Tonsillectomy Tubal Ligation. Family Hx  Family history of colon cancer: Mother (V16.0) (Z80.0) Rapid heartbeat (R00.0). Personal Hx  No alcohol use No caffeine use Smoker (305.1) (Z72.0). Signature  Electronically signed by : Serena Colonel  M.D.; 10/28/2012 3:32 PM EST.

## 2012-11-18 ENCOUNTER — Encounter (HOSPITAL_BASED_OUTPATIENT_CLINIC_OR_DEPARTMENT_OTHER): Payer: Self-pay | Admitting: *Deleted

## 2012-11-18 ENCOUNTER — Encounter (HOSPITAL_BASED_OUTPATIENT_CLINIC_OR_DEPARTMENT_OTHER)
Admission: RE | Disposition: A | Discharge status: Home / Self Care | Payer: Self-pay | Source: Ambulatory Visit | Attending: Otolaryngology

## 2012-11-18 ENCOUNTER — Ambulatory Visit (HOSPITAL_BASED_OUTPATIENT_CLINIC_OR_DEPARTMENT_OTHER): Payer: Medicare Other | Admitting: *Deleted

## 2012-11-18 ENCOUNTER — Encounter (HOSPITAL_BASED_OUTPATIENT_CLINIC_OR_DEPARTMENT_OTHER): Payer: Medicare Other | Admitting: *Deleted

## 2012-11-18 ENCOUNTER — Ambulatory Visit (HOSPITAL_BASED_OUTPATIENT_CLINIC_OR_DEPARTMENT_OTHER)
Admission: RE | Admit: 2012-11-18 | Discharge: 2012-11-18 | Disposition: A | Discharge status: Home / Self Care | Payer: Medicare Other | Source: Ambulatory Visit | Attending: Otolaryngology | Admitting: Otolaryngology

## 2012-11-18 DIAGNOSIS — K1120 Sialoadenitis, unspecified: Secondary | ICD-10-CM | POA: Insufficient documentation

## 2012-11-18 DIAGNOSIS — I10 Essential (primary) hypertension: Secondary | ICD-10-CM | POA: Insufficient documentation

## 2012-11-18 DIAGNOSIS — Z01812 Encounter for preprocedural laboratory examination: Secondary | ICD-10-CM | POA: Diagnosis not present

## 2012-11-18 DIAGNOSIS — K115 Sialolithiasis: Secondary | ICD-10-CM

## 2012-11-18 DIAGNOSIS — L259 Unspecified contact dermatitis, unspecified cause: Secondary | ICD-10-CM | POA: Diagnosis not present

## 2012-11-18 HISTORY — DX: Presence of spectacles and contact lenses: Z97.3

## 2012-11-18 HISTORY — PX: SALIVARY STONE REMOVAL: SHX5213

## 2012-11-18 LAB — POCT HEMOGLOBIN-HEMACUE: Hemoglobin: 12.6 g/dL (ref 12.0–15.0)

## 2012-11-18 SURGERY — REMOVAL, CALCULUS, SALIVARY DUCT
Anesthesia: Monitor Anesthesia Care | Site: Mouth | Wound class: Clean Contaminated

## 2012-11-18 MED ORDER — FENTANYL CITRATE 0.05 MG/ML IJ SOLN
INTRAMUSCULAR | Status: AC
  Filled 2012-11-18: qty 4

## 2012-11-18 MED ORDER — LIDOCAINE-EPINEPHRINE 1 %-1:100000 IJ SOLN
INTRAMUSCULAR | Status: DC | PRN
  Administered 2012-11-18: 2.5 mL

## 2012-11-18 MED ORDER — LIDOCAINE-EPINEPHRINE 1 %-1:100000 IJ SOLN
INTRAMUSCULAR | Status: AC
  Filled 2012-11-18: qty 1

## 2012-11-18 MED ORDER — MIDAZOLAM HCL 2 MG/2ML IJ SOLN
INTRAMUSCULAR | Status: AC
  Filled 2012-11-18: qty 4

## 2012-11-18 MED ORDER — FENTANYL CITRATE 0.05 MG/ML IJ SOLN
INTRAMUSCULAR | Status: DC | PRN
  Administered 2012-11-18: 50 ug via INTRAVENOUS

## 2012-11-18 MED ORDER — OXYCODONE HCL 5 MG PO TABS: 5.0000 mg | ORAL_TABLET | Freq: Once | ORAL | Status: DC | PRN

## 2012-11-18 MED ORDER — OXYCODONE HCL 5 MG/5ML PO SOLN: 5.0000 mg | Freq: Once | ORAL | Status: DC | PRN

## 2012-11-18 MED ORDER — PROPOFOL 10 MG/ML IV EMUL
INTRAVENOUS | Status: AC
  Filled 2012-11-18: qty 50

## 2012-11-18 MED ORDER — MIDAZOLAM HCL 2 MG/2ML IJ SOLN
INTRAMUSCULAR | Status: AC
  Filled 2012-11-18: qty 2

## 2012-11-18 MED ORDER — MIDAZOLAM HCL 5 MG/5ML IJ SOLN
INTRAMUSCULAR | Status: DC | PRN
  Administered 2012-11-18: 1 mg via INTRAVENOUS

## 2012-11-18 MED ORDER — LACTATED RINGERS IV SOLN
INTRAVENOUS | Status: DC
  Administered 2012-11-18: 08:00:00 via INTRAVENOUS

## 2012-11-18 MED ORDER — ONDANSETRON HCL 4 MG/2ML IJ SOLN: 4.0000 mg | Freq: Once | INTRAMUSCULAR | Status: DC | PRN

## 2012-11-18 MED ORDER — ONDANSETRON HCL 4 MG/2ML IJ SOLN
INTRAMUSCULAR | Status: DC | PRN
  Administered 2012-11-18: 4 mg via INTRAVENOUS

## 2012-11-18 MED ORDER — HYDROMORPHONE HCL PF 1 MG/ML IJ SOLN: 0.2500 mg | INTRAMUSCULAR | Status: DC | PRN

## 2012-11-18 MED ORDER — CLINDAMYCIN HCL 300 MG PO CAPS: 300.0000 mg | ORAL_CAPSULE | Freq: Three times a day (TID) | ORAL | Status: DC

## 2012-11-18 MED ORDER — SUCCINYLCHOLINE CHLORIDE 20 MG/ML IJ SOLN
INTRAMUSCULAR | Status: AC
  Filled 2012-11-18: qty 1

## 2012-11-18 SURGICAL SUPPLY — 28 items
BLADE SURG 15 STRL LF DISP TIS (BLADE) ×1 IMPLANT
BLADE SURG 15 STRL SS (BLADE) ×2
CANISTER SUCT 1200ML W/VALVE (MISCELLANEOUS) ×2 IMPLANT
CLEANER CAUTERY TIP 5X5 PAD (MISCELLANEOUS) ×1 IMPLANT
COVER MAYO STAND STRL (DRAPES) ×2 IMPLANT
COVER TABLE BACK 60X90 (DRAPES) ×2 IMPLANT
DECANTER SPIKE VIAL GLASS SM (MISCELLANEOUS) ×1 IMPLANT
DEPRESSOR TONGUE BLADE STERILE (MISCELLANEOUS) IMPLANT
ELECT COATED BLADE 2.86 ST (ELECTRODE) ×2 IMPLANT
ELECT REM PT RETURN 9FT ADLT (ELECTROSURGICAL) ×2
ELECTRODE REM PT RTRN 9FT ADLT (ELECTROSURGICAL) IMPLANT
GLOVE BIOGEL M 7.0 STRL (GLOVE) ×1 IMPLANT
GLOVE BIOGEL PI IND STRL 7.5 (GLOVE) IMPLANT
GLOVE BIOGEL PI INDICATOR 7.5 (GLOVE) ×1
GLOVE ECLIPSE 7.5 STRL STRAW (GLOVE) ×2 IMPLANT
GOWN PREVENTION PLUS XLARGE (GOWN DISPOSABLE) ×1 IMPLANT
MARKER SKIN DUAL TIP RULER LAB (MISCELLANEOUS) IMPLANT
NEEDLE 27GAX1X1/2 (NEEDLE) ×2 IMPLANT
PACK BASIN DAY SURGERY FS (CUSTOM PROCEDURE TRAY) ×2 IMPLANT
PAD CLEANER CAUTERY TIP 5X5 (MISCELLANEOUS) ×1
PENCIL FOOT CONTROL (ELECTRODE) ×2 IMPLANT
SHEET MEDIUM DRAPE 40X70 STRL (DRAPES) ×2 IMPLANT
SPONGE GAUZE 2X2 8PLY STRL LF (GAUZE/BANDAGES/DRESSINGS) IMPLANT
SUT CHROMIC 4 0 P 3 18 (SUTURE) ×1 IMPLANT
SYR CONTROL 10ML LL (SYRINGE) ×2 IMPLANT
TOWEL OR 17X24 6PK STRL BLUE (TOWEL DISPOSABLE) ×3 IMPLANT
TUBE CONNECTING 20X1/4 (TUBING) ×2 IMPLANT
YANKAUER SUCT BULB TIP NO VENT (SUCTIONS) ×1 IMPLANT

## 2012-11-18 NOTE — Anesthesia Postprocedure Evaluation (Signed)
  Anesthesia Post-op Note  Patient: Suzanne Turner  Procedure(s) Performed: Procedure(s): INTRAORAL EXCISION SUBMANDIBULAR STONE RIGHT  (N/A)  Patient Location: PACU  Anesthesia Type:MAC  Level of Consciousness: awake, alert  and oriented  Airway and Oxygen Therapy: Patient Spontanous Breathing  Post-op Pain: none  Post-op Assessment: Post-op Vital signs reviewed  Post-op Vital Signs: Reviewed  Complications: No apparent anesthesia complications

## 2012-11-18 NOTE — Interval H&P Note (Signed)
History and Physical Interval Note:  11/18/2012 7:58 AM  Suzanne Turner  has presented today for surgery, with the diagnosis of SUBMANDIBULAR STONE   The various methods of treatment have been discussed with the patient and family. After consideration of risks, benefits and other options for treatment, the patient has consented to  Procedure(s): INTRAORAL EXCISION SUBMANDIBULAR STONE RIGHT  (N/A) as a surgical intervention .  The patient's history has been reviewed, patient examined, no change in status, stable for surgery.  I have reviewed the patient's chart and labs.  Questions were answered to the patient's satisfaction.     Marites Nath

## 2012-11-18 NOTE — Anesthesia Preprocedure Evaluation (Signed)
Anesthesia Evaluation  Patient identified by MRN, date of birth, ID band Patient awake    Reviewed: Allergy & Precautions, H&P , NPO status , Patient's Chart, lab work & pertinent test results  Airway Mallampati: I TM Distance: >3 FB Neck ROM: Full    Dental  (+) Teeth Intact and Dental Advisory Given   Pulmonary  breath sounds clear to auscultation        Cardiovascular hypertension, Pt. on medications Rhythm:Regular Rate:Normal     Neuro/Psych    GI/Hepatic   Endo/Other    Renal/GU      Musculoskeletal   Abdominal   Peds  Hematology   Anesthesia Other Findings   Reproductive/Obstetrics                           Anesthesia Physical Anesthesia Plan  ASA: II  Anesthesia Plan: General   Post-op Pain Management:    Induction: Intravenous  Airway Management Planned: LMA and Oral ETT  Additional Equipment:   Intra-op Plan:   Post-operative Plan: Extubation in OR  Informed Consent: I have reviewed the patients History and Physical, chart, labs and discussed the procedure including the risks, benefits and alternatives for the proposed anesthesia with the patient or authorized representative who has indicated his/her understanding and acceptance.   Dental advisory given  Plan Discussed with: CRNA, Anesthesiologist and Surgeon  Anesthesia Plan Comments:         Anesthesia Quick Evaluation

## 2012-11-18 NOTE — Op Note (Signed)
OPERATIVE REPORT  DATE OF SURGERY: 11/18/2012  PATIENT:  Suzanne Turner,  68 y.o. female  PRE-OPERATIVE DIAGNOSIS:  SUBMANDIBULAR STONE RIGHT  POST-OPERATIVE DIAGNOSIS:  SUBMANDIBULAR STONE RIGHT  PROCEDURE:  Procedure(s): INTRAORAL EXCISION SUBMANDIBULAR TISSUE RIGHT   SURGEON:  Susy Frizzle, MD  ASSISTANTS: NONE  ANESTHESIA:   Local with intravenous sedation  EBL:  5 ml  DRAINS: None   LOCAL MEDICATIONS USED:  1% Xylocaine with epinephrine  SPECIMEN:  Right sublingual gland tissue  COUNTS:  Correct  PROCEDURE DETAILS: The patient was taken to the operating room and placed on the operating table in the supine position. Following induction of intravenous sedation, the patient was draped in a standard fashion. The oral cavity was inspected. Palpation of the submandibular skin externally and simultaneous floor of mouth palpation did not reveal any definite calculus. The left submandibular gland was massaged to express salivary flow in order to localize the duct orifice on the left so that the identification of the duct orifice on the right to be facilitated. Despite this, there was no identifiable duct orifice on the right. The right floor of mouth was infiltrated with local anesthetic solution. And a linear incision was created through the mucosa the right floor of mouth overlying the area of the sublingual gland. Careful sharp and blunt dissection this area was used to thoroughly evaluate the area back towards the site of where the submandibular gland had been. Dissection of surrounding sublingual tissue was performed removing majority of the sublingual gland. A duct remnant was never identified. They calculus was never identified. Once the mylohyoid muscle fibers were identified dissection was stopped at this point. 4-0 chromic sutures were used to reapproximate the skin the mucosal edges, total of 2 simple sutures. Patient was then transferred to recovery in stable  condition.    PATIENT DISPOSITION:  To PACU, stable

## 2012-11-18 NOTE — Anesthesia Procedure Notes (Signed)
Procedure Name: MAC Date/Time: 11/18/2012 8:21 AM Performed by: Serena Colonel Pre-anesthesia Checklist: Patient identified, Emergency Drugs available, Suction available and Patient being monitored Patient Re-evaluated:Patient Re-evaluated prior to inductionOxygen Delivery Method: Simple face mask Intubation Type: IV induction

## 2012-11-18 NOTE — Transfer of Care (Signed)
Immediate Anesthesia Transfer of Care Note  Patient: Suzanne Turner  Procedure(s) Performed: Procedure(s): INTRAORAL EXCISION SUBMANDIBULAR STONE RIGHT  (N/A)  Patient Location: PACU  Anesthesia Type:MAC  Level of Consciousness: awake, alert, oriented  Airway & Oxygen Therapy: Patient Spontanous Breathing  Post-op Assessment: Report given to PACU RN, Post -op Vital signs reviewed and stable and Patient moving all extremities  Post vital signs: Reviewed and stable  Complications: No apparent anesthesia complications

## 2012-11-19 ENCOUNTER — Encounter (HOSPITAL_BASED_OUTPATIENT_CLINIC_OR_DEPARTMENT_OTHER): Payer: Self-pay | Admitting: Otolaryngology

## 2012-12-11 ENCOUNTER — Ambulatory Visit (INDEPENDENT_AMBULATORY_CARE_PROVIDER_SITE_OTHER): Payer: Medicare Other | Admitting: Internal Medicine

## 2012-12-11 ENCOUNTER — Encounter: Payer: Self-pay | Admitting: Internal Medicine

## 2012-12-11 ENCOUNTER — Telehealth: Payer: Self-pay | Admitting: *Deleted

## 2012-12-11 VITALS — BP 137/77 | HR 67 | Temp 97.6°F | Resp 18 | Wt 130.0 lb

## 2012-12-11 DIAGNOSIS — R059 Cough, unspecified: Secondary | ICD-10-CM

## 2012-12-11 DIAGNOSIS — F17200 Nicotine dependence, unspecified, uncomplicated: Secondary | ICD-10-CM

## 2012-12-11 DIAGNOSIS — Z0000 Encounter for general adult medical examination without abnormal findings: Secondary | ICD-10-CM

## 2012-12-11 DIAGNOSIS — M899 Disorder of bone, unspecified: Secondary | ICD-10-CM | POA: Diagnosis not present

## 2012-12-11 DIAGNOSIS — R21 Rash and other nonspecific skin eruption: Secondary | ICD-10-CM

## 2012-12-11 DIAGNOSIS — E785 Hyperlipidemia, unspecified: Secondary | ICD-10-CM | POA: Diagnosis not present

## 2012-12-11 DIAGNOSIS — R05 Cough: Secondary | ICD-10-CM

## 2012-12-11 DIAGNOSIS — I10 Essential (primary) hypertension: Secondary | ICD-10-CM | POA: Diagnosis not present

## 2012-12-11 DIAGNOSIS — Z139 Encounter for screening, unspecified: Secondary | ICD-10-CM | POA: Diagnosis not present

## 2012-12-11 DIAGNOSIS — Z23 Encounter for immunization: Secondary | ICD-10-CM

## 2012-12-11 DIAGNOSIS — E559 Vitamin D deficiency, unspecified: Secondary | ICD-10-CM

## 2012-12-11 LAB — POCT URINALYSIS DIPSTICK
Bilirubin, UA: NEGATIVE
Blood, UA: NEGATIVE
Glucose, UA: NEGATIVE
Ketones, UA: NEGATIVE
Leukocytes, UA: NEGATIVE
Nitrite, UA: NEGATIVE
Protein, UA: NEGATIVE
Spec Grav, UA: 1.01
Urobilinogen, UA: NEGATIVE
pH, UA: 7

## 2012-12-11 LAB — HEMOCCULT GUIAC POC 1CARD (OFFICE): Fecal Occult Blood, POC: NEGATIVE

## 2012-12-11 MED ORDER — NYSTATIN 100000 UNIT/GM EX POWD: CUTANEOUS | Status: DC

## 2012-12-11 NOTE — Progress Notes (Signed)
Subjective:    Patient ID: Suzanne Turner, female    DOB: 04/16/1945, 67 y.o.   MRN: [id number]  HPI Syrai is here for CPE  She tells me the treatment for her skin disease is not helping  Dr. Yetta Barre gave her a strong steroid but she states it doesn't help her.    She did have her salivary  Stone extracted by her ENT.  She had two salivary stones.  Had a problem with post op infection but she is now off antibiotics.    Myrikal reports she is a long term smoker for at least 40 years.   She will be due for mm and DEXA in January  Allergies  Allergen Reactions  . Medrol [Methylprednisolone] Other (See Comments)    swelling  . Penicillins Rash  . Sulfonamide Derivatives Rash   Past Medical History  Diagnosis Date  . Hypertension   . Osteoarthritis   . Bronchitis   . Osteopenia   . High cholesterol     no medications  . Salivary duct stone   . Kidney stone   . Chronic eczema   . Wears glasses    Past Surgical History  Procedure Laterality Date  . Tonsillectomy  1949  . Tubal ligation  1977  . Salivary stone removal Right 05/15/2012    Procedure: EXCISION OF RIGHT SUBMANDIBULAR GLAND ;  Surgeon: Serena Colonel, MD;  Location: [facility] OR;  Service: ENT;  Laterality: Right;  . Orif tibia & fibula fractures  1966    rt  . Salivary stone removal N/A 11/18/2012    Procedure: INTRAORAL EXCISION SUBMANDIBULAR STONE RIGHT ;  Surgeon: Serena Colonel, MD;  Location: [facility];  Service: ENT;  Laterality: N/A;   History   Social History  . Marital Status: Widowed    Spouse Name: N/A    Number of Children: N/A  . Years of Education: N/A   Occupational History  . Not on file.   Social History Main Topics  . Smoking status: Current Every Day Smoker -- 0.50 packs/day for 40 years    Types: Cigarettes  . Smokeless tobacco: Never Used  . Alcohol Use: No  . Drug Use: No  . Sexual Activity: Not Currently   Other Topics Concern  . Not on file   Social History Narrative   . No narrative on file   Family History  Problem Relation Age of Onset  . Colon cancer Mother   . Arrhythmia Mother   . Arrhythmia Brother   . Arrhythmia Maternal Uncle   . Arrhythmia Maternal Grandmother   . Breast cancer Sister   . Colon cancer Maternal Aunt    Patient Active Problem List   Diagnosis Date Noted  . Salivary gland enlargement 02/14/2012  . Colon polyps 04/17/2011  . Tremor 04/17/2011  . History of renal stone 04/09/2011  . Hyperlipidemia 03/29/2011  . Family history of colon cancer 03/29/2011  . Family history of breast cancer in first degree relative 03/29/2011  . Palpitations 02/07/2011  . Chest pain 02/07/2011  . Smoking 02/07/2011  . CONSTIPATION 05/11/2008  . VITAMIN D DEFICIENCY 05/07/2008  . HYPERTENSION 05/07/2008  . OSTEOPENIA 05/07/2008   Current Outpatient Prescriptions on File Prior to Visit  Medication Sig Dispense Refill  . acetaminophen (TYLENOL) 325 MG tablet Take 325 mg by mouth daily as needed for pain.      Marland Kitchen aspirin EC 81 MG tablet Take 81 mg by mouth daily.        Marland Kitchen  calcium carbonate (OS-CAL) 600 MG TABS Take 600 mg by mouth 2 (two) times daily with a meal.      . clindamycin (CLEOCIN) 300 MG capsule Take 1 capsule (300 mg total) by mouth 3 (three) times daily.  15 capsule  0  . lisinopril (PRINIVIL,ZESTRIL) 40 MG tablet Take 1 tablet (40 mg total) by mouth daily.  30 tablet  5  . metoprolol succinate (TOPROL-XL) 25 MG 24 hr tablet Take 25 mg by mouth daily.      . Multiple Vitamins-Minerals (HAIR/SKIN/NAILS/BIOTIN PO) Take 500 mg by mouth 3 (three) times daily.       No current facility-administered medications on file prior to visit.       Review of Systems  HENT: Negative for facial swelling and sore throat.   Skin: Positive for rash.  All other systems reviewed and are negative.       Objective:   Physical Exam  Physical Exam  Nursing note and vitals reviewed.  Constitutional: She is oriented to person, place, and  time. She appears well-developed and well-nourished.  HENT:  Head: Normocephalic and atraumatic.  Right Ear: Tympanic membrane and ear canal normal. No drainage. Tympanic membrane is not injected and not erythematous.  Left Ear: Tympanic membrane and ear canal normal. No drainage. Tympanic membrane is not injected and not erythematous.  Nose: Nose normal. Right sinus exhibits no maxillary sinus tenderness and no frontal sinus tenderness. Left sinus exhibits no maxillary sinus tenderness and no frontal sinus tenderness.  Mouth/Throat: Oropharynx is clear and moist. No oral lesions. No oropharyngeal exudate.  Eyes: Conjunctivae and EOM are normal. Pupils are equal, round, and reactive to light.  Neck: Normal range of motion. Neck supple. No JVD present. Carotid bruit is not present. No mass and no thyromegaly present.  Cardiovascular: Normal rate, regular rhythm, S1 normal, S2 normal and intact distal pulses. Exam reveals no gallop and no friction rub.  No murmur heard.  Pulses:  Carotid pulses are 2+ on the right side, and 2+ on the left side.  Dorsalis pedis pulses are 2+ on the right side, and 2+ on the left side.  No carotid bruit. No LE edema  Pulmonary/Chest: Breath sounds normal. She has no wheezes. She has no rales. She exhibits no tenderness.  Abdominal: Soft. Bowel sounds are normal. She exhibits no distension and no mass. There is no hepatosplenomegaly. There is no tenderness. There is no CVA tenderness.  Musculoskeletal: Normal range of motion.  No active synovitis to joints.  Lymphadenopathy:  She has no cervical adenopathy.  She has no axillary adenopathy.  Right: No inguinal and no supraclavicular adenopathy present.  Left: No inguinal and no supraclavicular adenopathy present.  Neurological: She is alert and oriented to person, place, and time. She has normal strength and normal reflexes. She displays no tremor. No cranial nerve deficit or sensory deficit. Coordination and gait  normal.  Skin: Skin is warm and dry. Reddened maculopapular rash lower legs and extensor surface of elbows. No cyanosis. Nails show no clubbing.  Psychiatric: She has a normal mood and affect. Her speech is normal and behavior is normal. Cognition and memory are normal.            Assessment & Plan:  Health Maintenance:   Will give influenza today.  Advised 3D MM and dexa due in January   See scanned report.  Pap evey 3 years  High risk smoker  Pt has 40 pack year smoking hsitory  I will order  a screening lung CT as current guidelines recommend..  I have been informed that no [employer] will pay for this test .  Pt has been informed of this and the up front cost is $299.00 and she cannot afford this at this time.  Pt advised to let us know if she changes her mind about getting test.   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  Potent steroid not helping.  Will refer to [facility] dermatology for second opinion  Sialolithaisis:  She is S/P surgery and is off antibiotics now.

## 2012-12-11 NOTE — Addendum Note (Signed)
Addended by: Raechel Chute D on: 12/11/2012 03:27 PM   Modules accepted: Orders

## 2012-12-11 NOTE — Patient Instructions (Addendum)
Will set up dermatology referral    Will call you   Will set up screening Lung CT    Will need to check insurance.   Will get  3 D mammgram and bone density test

## 2012-12-11 NOTE — Telephone Encounter (Signed)
Called and left patient message about Mammogram and bone density appt at [facility] 02/07/13 at 10:30 am

## 2012-12-12 ENCOUNTER — Telehealth: Payer: Self-pay | Admitting: *Deleted

## 2012-12-12 NOTE — Telephone Encounter (Signed)
Notified pt of appointment with Corine Pesci PA on Dec. 4th at 9:00am

## 2012-12-23 HISTORY — PX: SKIN BIOPSY: SHX1

## 2012-12-24 ENCOUNTER — Telehealth: Payer: Self-pay | Admitting: *Deleted

## 2012-12-24 DIAGNOSIS — H5310 Unspecified subjective visual disturbances: Secondary | ICD-10-CM | POA: Diagnosis not present

## 2012-12-24 NOTE — Telephone Encounter (Signed)
[facility] contacted regarding recent labs

## 2012-12-25 LAB — COMPREHENSIVE METABOLIC PANEL
ALT: 17 U/L (ref 0–35)
AST: 24 U/L (ref 0–37)
Albumin: 4.6 g/dL (ref 3.5–5.2)
Alkaline Phosphatase: 51 U/L (ref 39–117)
BUN: 12 mg/dL (ref 6–23)
CO2: 27 mEq/L (ref 19–32)
Calcium: 9.8 mg/dL (ref 8.4–10.5)
Chloride: 105 mEq/L (ref 96–112)
Creat: 0.86 mg/dL (ref 0.50–1.10)
Glucose, Bld: 70 mg/dL (ref 70–99)
Potassium: 3.8 mEq/L (ref 3.5–5.3)
Sodium: 137 mEq/L (ref 135–145)
Total Bilirubin: 0.5 mg/dL (ref 0.3–1.2)
Total Protein: 7.5 g/dL (ref 6.0–8.3)

## 2012-12-25 LAB — LIPID PANEL
Cholesterol: 206 mg/dL — ABNORMAL HIGH (ref 0–200)
HDL: 60 mg/dL
LDL Cholesterol: 123 mg/dL — ABNORMAL HIGH (ref 0–99)
Total CHOL/HDL Ratio: 3.4 ratio
Triglycerides: 113 mg/dL
VLDL: 23 mg/dL (ref 0–40)

## 2012-12-26 DIAGNOSIS — D485 Neoplasm of uncertain behavior of skin: Secondary | ICD-10-CM | POA: Diagnosis not present

## 2012-12-26 DIAGNOSIS — L259 Unspecified contact dermatitis, unspecified cause: Secondary | ICD-10-CM | POA: Diagnosis not present

## 2012-12-26 DIAGNOSIS — R21 Rash and other nonspecific skin eruption: Secondary | ICD-10-CM | POA: Diagnosis not present

## 2012-12-26 LAB — CBC WITH DIFFERENTIAL/PLATELET
Basophils Absolute: 0 10*3/uL (ref 0.0–0.1)
Basophils Relative: 0 % (ref 0–1)
Eosinophils Absolute: 0.1 10*3/uL (ref 0.0–0.7)
Eosinophils Relative: 1 % (ref 0–5)
HCT: 36.3 % (ref 36.0–46.0)
Hemoglobin: 12.7 g/dL (ref 12.0–15.0)
Lymphocytes Relative: 9 % — ABNORMAL LOW (ref 12–46)
Lymphs Abs: 0.6 10*3/uL — ABNORMAL LOW (ref 0.7–4.0)
MCH: 31.3 pg (ref 26.0–34.0)
MCHC: 35 g/dL (ref 30.0–36.0)
MCV: 89.4 fL (ref 78.0–100.0)
Monocytes Absolute: 0.4 10*3/uL (ref 0.1–1.0)
Monocytes Relative: 5 % (ref 3–12)
Neutro Abs: 5.8 10*3/uL (ref 1.7–7.7)
Neutrophils Relative %: 85 % — ABNORMAL HIGH (ref 43–77)
Platelets: 238 10*3/uL (ref 150–400)
RBC: 4.06 MIL/uL (ref 3.87–5.11)
RDW: 14.3 % (ref 11.5–15.5)
WBC: 6.9 10*3/uL (ref 4.0–10.5)

## 2012-12-26 LAB — TSH: TSH: 1.994 u[IU]/mL (ref 0.350–4.500)

## 2012-12-26 LAB — VITAMIN D 25 HYDROXY (VIT D DEFICIENCY, FRACTURES): Vit D, 25-Hydroxy: 65 ng/mL (ref 30–89)

## 2012-12-30 ENCOUNTER — Telehealth: Payer: Self-pay | Admitting: Internal Medicine

## 2012-12-30 NOTE — Telephone Encounter (Signed)
Spoke with Dr. Genevie Ann dermatologist at [facility].    Re-biopsy suggests  Drug eruption   Perhaps lisinopril culprit as pt thinks she started this around 6 years ago.  OK to stop lisinopril.   Advised derm  MD to counsel pt to take her atenolol 25 mg increase to bid and see me in office in nexxt 1-2 weeks

## 2013-01-06 ENCOUNTER — Ambulatory Visit (INDEPENDENT_AMBULATORY_CARE_PROVIDER_SITE_OTHER): Payer: Medicare Other | Admitting: Internal Medicine

## 2013-01-06 ENCOUNTER — Encounter: Payer: Self-pay | Admitting: Internal Medicine

## 2013-01-06 VITALS — BP 131/72 | HR 66 | Temp 97.3°F | Resp 18

## 2013-01-06 DIAGNOSIS — L270 Generalized skin eruption due to drugs and medicaments taken internally: Secondary | ICD-10-CM

## 2013-01-06 DIAGNOSIS — I10 Essential (primary) hypertension: Secondary | ICD-10-CM | POA: Diagnosis not present

## 2013-01-06 NOTE — Patient Instructions (Signed)
See me as needed 

## 2013-01-06 NOTE — Progress Notes (Signed)
Subjective:    Patient ID: Suzanne Turner, female    DOB: 23-Sep-1945, 67 y.o.   MRN: [id number]  HPI Suzanne Turner is here for follow up of possible drug eruption and her HTN  I received a call from her dematologist At [facility] and re-biopsy revealed possible drug eruption    WE discussed removing the lisinopril to assess if this possible culprit.  She has been off lisinopril for one week  HTN  Medication changed to Atenolol 25 mg bid  .  Tolerating fine    Allergies  Allergen Reactions  . Medrol [Methylprednisolone] Other (See Comments)    swelling  . Penicillins Rash  . Sulfonamide Derivatives Rash   Past Medical History  Diagnosis Date  . Hypertension   . Osteoarthritis   . Bronchitis   . Osteopenia   . High cholesterol     no medications  . Salivary duct stone   . Kidney stone   . Chronic eczema   . Wears glasses    Past Surgical History  Procedure Laterality Date  . Tonsillectomy  1949  . Tubal ligation  1977  . Salivary stone removal Right 05/15/2012    Procedure: EXCISION OF RIGHT SUBMANDIBULAR GLAND ;  Surgeon: Serena Colonel, MD;  Location: [facility] OR;  Service: ENT;  Laterality: Right;  . Orif tibia & fibula fractures  1966    rt  . Salivary stone removal N/A 11/18/2012    Procedure: INTRAORAL EXCISION SUBMANDIBULAR STONE RIGHT ;  Surgeon: Serena Colonel, MD;  Location: [facility];  Service: ENT;  Laterality: N/A;   History   Social History  . Marital Status: Widowed    Spouse Name: N/A    Number of Children: N/A  . Years of Education: N/A   Occupational History  . Not on file.   Social History Main Topics  . Smoking status: Current Every Day Smoker -- 0.50 packs/day for 40 years    Types: Cigarettes  . Smokeless tobacco: Never Used  . Alcohol Use: No  . Drug Use: No  . Sexual Activity: Not Currently   Other Topics Concern  . Not on file   Social History Narrative  . No narrative on file   Family History  Problem Relation Age of Onset  . Colon  cancer Mother   . Arrhythmia Mother   . Arrhythmia Brother   . Arrhythmia Maternal Uncle   . Arrhythmia Maternal Grandmother   . Breast cancer Sister   . Colon cancer Maternal Aunt    Patient Active Problem List   Diagnosis Date Noted  . Salivary gland enlargement 02/14/2012  . Colon polyps 04/17/2011  . Tremor 04/17/2011  . History of renal stone 04/09/2011  . Hyperlipidemia 03/29/2011  . Family history of colon cancer 03/29/2011  . Family history of breast cancer in first degree relative 03/29/2011  . Palpitations 02/07/2011  . Chest pain 02/07/2011  . Smoking 02/07/2011  . CONSTIPATION 05/11/2008  . VITAMIN D DEFICIENCY 05/07/2008  . HYPERTENSION 05/07/2008  . OSTEOPENIA 05/07/2008   Current Outpatient Prescriptions on File Prior to Visit  Medication Sig Dispense Refill  . acetaminophen (TYLENOL) 325 MG tablet Take 325 mg by mouth daily as needed for pain.      Marland Kitchen aspirin EC 81 MG tablet Take 81 mg by mouth daily.        . calcium carbonate (OS-CAL) 600 MG TABS Take 600 mg by mouth 2 (two) times daily with a meal.      .  metoprolol succinate (TOPROL-XL) 25 MG 24 hr tablet Take 25 mg by mouth daily.      . Multiple Vitamins-Minerals (HAIR/SKIN/NAILS/BIOTIN PO) Take 500 mg by mouth 3 (three) times daily.      Marland Kitchen lisinopril (PRINIVIL,ZESTRIL) 40 MG tablet Take 1 tablet (40 mg total) by mouth daily.  30 tablet  5  . nystatin (MYCOSTATIN/NYSTOP) 100000 UNIT/GM POWD Apply to corners of mouth bid  30 Bottle  0   No current facility-administered medications on file prior to visit.       Review of Systems    see HPI Objective:   Physical Exam  Physical Exam  Nursing note and vitals reviewed.  Constitutional: She is oriented to person, place, and time. She appears well-developed and well-nourished.  HENT:  Head: Normocephalic and atraumatic.  Cardiovascular: Normal rate and regular rhythm. Exam reveals no gallop and no friction rub.  No murmur heard.  Pulmonary/Chest:  Breath sounds normal. She has no wheezes. She has no rales.  Neurological: She is alert and oriented to person, place, and time.  Skin: Skin is warm and dry.  Biopsy site  R arm   well healed  .  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 Her behavior is normal.            Assessment & Plan:  HTN continue atenolol 25 mg bid  Possible drug eruption  Off Lisinopril now  Suture removal  R arm

## 2013-02-07 ENCOUNTER — Ambulatory Visit: Admission: RE | Admit: 2013-02-07 | Payer: Medicare Other | Source: Ambulatory Visit

## 2013-02-07 ENCOUNTER — Ambulatory Visit
Admission: RE | Admit: 2013-02-07 | Discharge: 2013-02-07 | Disposition: A | Discharge status: Home / Self Care | Payer: Medicare Other | Source: Ambulatory Visit | Attending: Internal Medicine | Admitting: Internal Medicine

## 2013-02-07 ENCOUNTER — Other Ambulatory Visit: Payer: Self-pay | Admitting: Internal Medicine

## 2013-02-07 DIAGNOSIS — M949 Disorder of cartilage, unspecified: Secondary | ICD-10-CM | POA: Diagnosis not present

## 2013-02-07 DIAGNOSIS — Z1231 Encounter for screening mammogram for malignant neoplasm of breast: Secondary | ICD-10-CM

## 2013-02-07 DIAGNOSIS — M899 Disorder of bone, unspecified: Secondary | ICD-10-CM

## 2013-02-11 ENCOUNTER — Encounter: Payer: Self-pay | Admitting: Internal Medicine

## 2013-02-11 ENCOUNTER — Telehealth: Payer: Self-pay | Admitting: *Deleted

## 2013-02-11 NOTE — Telephone Encounter (Signed)
LVM message for pt to return call  

## 2013-02-11 NOTE — Telephone Encounter (Signed)
Message copied by Conley Rolls on Tue Feb 11, 2013  9:40 AM ------      Message from: [name]      Created: Tue Feb 11, 2013  8:02 AM       Suzanne Turner            Call pt and let her know that her bone density study shows osteopenia but very close to osteoporosis level.  If she wishes to discuss treatment options in office schedule 30 min visit with me.  If she does not want to take an osteoporosis med then take calcium 1200-1500 mg daily with vitamin D [phone] units daily ------

## 2013-02-12 ENCOUNTER — Telehealth: Payer: Self-pay | Admitting: *Deleted

## 2013-02-12 NOTE — Telephone Encounter (Signed)
Message copied by Conley Rolls on Wed Feb 12, 2013  1:55 PM ------      Message from: [name]      Created: Tue Feb 11, 2013  8:02 AM       Suzanne Turner            Call pt and let her know that her bone density study shows osteopenia but very close to osteoporosis level.  If she wishes to discuss treatment options in office schedule 30 min visit with me.  If she does not want to take an osteoporosis med then take calcium 1200-1500 mg daily with vitamin D [phone] units daily ------

## 2013-02-12 NOTE — Telephone Encounter (Signed)
Pt does not wish to go on osteoporosis medication at this time

## 2013-02-12 NOTE — Telephone Encounter (Signed)
LVM message for pt to return call  

## 2013-02-27 ENCOUNTER — Encounter: Payer: Self-pay | Admitting: *Deleted

## 2013-03-01 ENCOUNTER — Encounter: Payer: Self-pay | Admitting: Internal Medicine

## 2013-03-03 DIAGNOSIS — L259 Unspecified contact dermatitis, unspecified cause: Secondary | ICD-10-CM | POA: Diagnosis not present

## 2013-03-04 ENCOUNTER — Ambulatory Visit (INDEPENDENT_AMBULATORY_CARE_PROVIDER_SITE_OTHER): Payer: Medicare Other | Admitting: Cardiology

## 2013-03-04 ENCOUNTER — Encounter: Payer: Self-pay | Admitting: Cardiology

## 2013-03-04 ENCOUNTER — Other Ambulatory Visit: Payer: Self-pay | Admitting: Cardiology

## 2013-03-04 VITALS — BP 126/74 | HR 67 | Ht 67.0 in | Wt 135.0 lb

## 2013-03-04 DIAGNOSIS — Z789 Other specified health status: Secondary | ICD-10-CM

## 2013-03-04 DIAGNOSIS — I10 Essential (primary) hypertension: Secondary | ICD-10-CM

## 2013-03-04 DIAGNOSIS — R002 Palpitations: Secondary | ICD-10-CM

## 2013-03-04 DIAGNOSIS — Z8249 Family history of ischemic heart disease and other diseases of the circulatory system: Secondary | ICD-10-CM | POA: Diagnosis not present

## 2013-03-04 DIAGNOSIS — F17200 Nicotine dependence, unspecified, uncomplicated: Secondary | ICD-10-CM

## 2013-03-04 DIAGNOSIS — Z9189 Other specified personal risk factors, not elsewhere classified: Secondary | ICD-10-CM

## 2013-03-04 DIAGNOSIS — IMO0001 Reserved for inherently not codable concepts without codable children: Secondary | ICD-10-CM

## 2013-03-04 NOTE — Patient Instructions (Signed)
Schedule an appointment for a Calcium Artery Score CT.  Your physician wants you to follow-up in: 1 year with Dr Aundra Dubin. (February 2015).  You will receive a reminder letter in the mail two months in advance. If you don't receive a letter, please call our office to schedule the follow-up appointment.

## 2013-03-05 DIAGNOSIS — Z9189 Other specified personal risk factors, not elsewhere classified: Secondary | ICD-10-CM | POA: Insufficient documentation

## 2013-03-05 NOTE — Progress Notes (Signed)
Patient ID: Suzanne Turner, female   DOB: Dec 08, 1945, 68 y.o.   MRN: [id number] PCP: Dr. Bubba Camp  68 yo with history of smoking, palpitations, and HTN returns for followup. Palpitations are minimal on Toprol XL.  She denies exertional dyspnea or chest pain.  No physical limitations.  She continues to smoke about a pack a day.  She has a maculopapular rash on her right shin and left elbow.  She stopped her lisinopril to see if this was a drug eruption but it did not help.  ECG: NSR, nonspecific T wave flattening.  Labs (1/13): K 3.6, creatinine 0.71, HCT 36.3 Labs (1/14): K 4.1, creatinine 0.85 Labs (11/14): LDL 123, HDL 60, TSH normal, K 3.8, creatinine 0.86  PMH: 1. HTN 2. Hyperlipidemia 3. Osteoporosis 4. Nephrolithiasis 5. Palpitations: 3 week event monitor in 1/13 with no significant arrhythmic events.  6. Active smoker 7. Stress echo (12/12): Normal study 8. Sialolithiasis  SH: Smokes 1/2 ppd.  Lives in [place], retired, married.   FH: She thinks that her grandmother, brother, and mother all had atrial fibrillation. Father with MI at 85.  ROS: All systems reviewed and negative except as per HPI.   Current Outpatient Prescriptions  Medication Sig Dispense Refill  . acetaminophen (TYLENOL) 325 MG tablet Take 325 mg by mouth daily as needed for pain.      Marland Kitchen aspirin EC 81 MG tablet Take 81 mg by mouth daily.        . calcium carbonate (OS-CAL) 600 MG TABS Take 600 mg by mouth 2 (two) times daily with a meal.      . metoprolol succinate (TOPROL-XL) 25 MG 24 hr tablet Take 25 mg by mouth 2 (two) times daily.       . Multiple Vitamins-Minerals (HAIR/SKIN/NAILS/BIOTIN PO) Take 500 mg by mouth 3 (three) times daily.      Marland Kitchen nystatin (MYCOSTATIN/NYSTOP) 100000 UNIT/GM POWD Apply to corners of mouth bid  30 Bottle  0  . lisinopril (PRINIVIL,ZESTRIL) 40 MG tablet Take 1 tablet (40 mg total) by mouth daily.  30 tablet  5   No current facility-administered medications for this visit.     BP 126/74  Pulse 67  Ht 5\' 7"  (1.702 m)  Wt 61.236 kg (135 lb)  BMI 21.14 kg/m2 General: NAD Neck: No JVD, no thyromegaly or thyroid nodule.  Lungs: Slight rhonchi.  CV: Nondisplaced PMI.  Heart regular S1/S2, no S3/S4, no murmur.  No peripheral edema.  No carotid bruit.  Normal pedal pulses.  Abdomen: Soft, nontender, no hepatosplenomegaly, no distention.  Neurologic: Alert and oriented x 3.  Psych: Normal affect. Extremities: No clubbing or cyanosis.   Assessment/Plan: 1. Palpitations: Resolved on Toprol XL.   2. Smoking: I strongly encouraged the patient to quit.  3. Cardiac risk: Suzanne Turner's father had MI at 25.  Her other risk factors for CAD are smoking and HTN.  She does not have ischemic symptoms.  She is not on a statin and LDL is not particularly elevated.  I am going to have her get a coronary artery calcium score.  If this is significantly abnormal, I would recommend a statin.   Loralie Champagne 03/05/2013

## 2013-03-10 ENCOUNTER — Telehealth: Payer: Self-pay | Admitting: *Deleted

## 2013-03-10 ENCOUNTER — Telehealth: Payer: Self-pay | Admitting: Internal Medicine

## 2013-03-10 ENCOUNTER — Other Ambulatory Visit: Payer: Self-pay | Admitting: Cardiology

## 2013-03-10 NOTE — Telephone Encounter (Signed)
Suzanne Turner stopped taking lisinopril due to rash, but her blood pressure has started to skyrocket.  She would like you to call her.

## 2013-03-10 NOTE — Telephone Encounter (Signed)
Spoke with pt.    No change in rash since coming off lisinopril    Bp elevataed at last [facility] visit  160's    . She wishes to take lisinopril again.    Pharmacy is checking with Dr. Claris Gladden office  Pt to see me this week

## 2013-03-13 ENCOUNTER — Ambulatory Visit (INDEPENDENT_AMBULATORY_CARE_PROVIDER_SITE_OTHER): Payer: Medicare Other | Admitting: Internal Medicine

## 2013-03-13 ENCOUNTER — Encounter: Payer: Self-pay | Admitting: Internal Medicine

## 2013-03-13 VITALS — BP 146/81 | HR 70 | Temp 97.7°F | Resp 20 | Wt 135.0 lb

## 2013-03-13 DIAGNOSIS — R21 Rash and other nonspecific skin eruption: Secondary | ICD-10-CM | POA: Diagnosis not present

## 2013-03-13 DIAGNOSIS — I10 Essential (primary) hypertension: Secondary | ICD-10-CM

## 2013-03-13 NOTE — Progress Notes (Signed)
Subjective:    Patient ID: Suzanne Turner, female    DOB: 07/18/1945, 68 y.o.   MRN: [id number]  HPI  Suzanne Turner is here for acute visit.    Her BP at home was in the 170's and pt became nervous and restarted her lisinopril for about one week.  She had been off her lisinopril for 2 months due to probable drug induced rash.  Her only BP med was toprol 25 mg bid.   She does report when restarting the lisinopril, she has stinging sensation in mouth and new red rash around her mouth  Suzanne Turner does not wish to take diuretic as her K was very low 2 years ago  - unclear as to etiology of low K.   Allergies  Allergen Reactions  . Medrol [Methylprednisolone] Other (See Comments)    swelling  . Penicillins Rash  . Sulfonamide Derivatives Rash   Past Medical History  Diagnosis Date  . Hypertension   . Osteoarthritis   . Bronchitis   . Osteopenia   . High cholesterol     no medications  . Salivary duct stone   . Kidney stone   . Chronic eczema   . Wears glasses    Past Surgical History  Procedure Laterality Date  . Tonsillectomy  1949  . Tubal ligation  1977  . Salivary stone removal Right 05/15/2012    Procedure: EXCISION OF RIGHT SUBMANDIBULAR GLAND ;  Surgeon: Izora Gala, MD;  Location: [place];  Service: ENT;  Laterality: Right;  . Orif tibia & fibula fractures  1966    rt  . Salivary stone removal N/A 11/18/2012    Procedure: INTRAORAL EXCISION SUBMANDIBULAR STONE RIGHT ;  Surgeon: Izora Gala, MD;  Location: [facility];  Service: ENT;  Laterality: N/A;  . Skin biopsy     History   Social History  . Marital Status: Widowed    Spouse Name: N/A    Number of Children: N/A  . Years of Education: N/A   Occupational History  . Not on file.   Social History Main Topics  . Smoking status: Current Every Day Smoker -- 0.50 packs/day for 40 years    Types: Cigarettes  . Smokeless tobacco: Never Used  . Alcohol Use: No  . Drug Use: No  . Sexual Activity: Not  Currently   Other Topics Concern  . Not on file   Social History Narrative  . No narrative on file   Family History  Problem Relation Age of Onset  . Colon cancer Mother   . Arrhythmia Mother   . Arrhythmia Brother   . Arrhythmia Maternal Uncle   . Arrhythmia Maternal Grandmother   . Breast cancer Sister   . Colon cancer Maternal Aunt    Patient Active Problem List   Diagnosis Date Noted  . At risk for coronary artery disease 03/05/2013  . Salivary gland enlargement 02/14/2012  . Colon polyps 04/17/2011  . Tremor 04/17/2011  . History of renal stone 04/09/2011  . Hyperlipidemia 03/29/2011  . Family history of colon cancer 03/29/2011  . Family history of breast cancer in first degree relative 03/29/2011  . Palpitations 02/07/2011  . Chest pain 02/07/2011  . Smoking 02/07/2011  . CONSTIPATION 05/11/2008  . VITAMIN D DEFICIENCY 05/07/2008  . HYPERTENSION 05/07/2008  . OSTEOPENIA 05/07/2008   Current Outpatient Prescriptions on File Prior to Visit  Medication Sig Dispense Refill  . acetaminophen (TYLENOL) 325 MG tablet Take 325 mg by mouth daily as  needed for pain.      Marland Kitchen aspirin EC 81 MG tablet Take 81 mg by mouth daily.        . calcium carbonate (OS-CAL) 600 MG TABS Take 600 mg by mouth 2 (two) times daily with a meal.      . metoprolol succinate (TOPROL-XL) 25 MG 24 hr tablet Take 25 mg by mouth 2 (two) times daily.       . Multiple Vitamins-Minerals (HAIR/SKIN/NAILS/BIOTIN PO) Take 500 mg by mouth 3 (three) times daily.      Marland Kitchen nystatin (MYCOSTATIN/NYSTOP) 100000 UNIT/GM POWD Apply to corners of mouth bid  30 Bottle  0   No current facility-administered medications on file prior to visit.      Review of Systems    see HPI Objective:   Physical Exam Physical Exam  Nursing note and vitals reviewed.   BP  144/84 my exam Constitutional: She is oriented to person, place, and time. She appears well-developed and well-nourished.  HENT:  Head: Normocephalic and  atraumatic.  Cardiovascular: Normal rate and regular rhythm. Exam reveals no gallop and no friction rub.  No murmur heard.  Pulmonary/Chest: Breath sounds normal. She has no wheezes. She has no rales.  Neurological: She is alert and oriented to person, place, and time.  Skin: Skin is warm and dry.  Psychiatric: She has a normal mood and affect. Her behavior is normal.         Assessment & Plan:  Htn:  Suzanne Turner declines diuretic. Will continue Toprol 25 mg bid,  Stop ACE I and start Cozaar 25 mg daily.  She is to moniter BP as outpt and bring to next visit.    Presumed drug rash  .  Stay offf ACE for now.   See me in 3 weeks .

## 2013-03-13 NOTE — Patient Instructions (Signed)
See me 3 weeks  30 mins  [place]  BP and bring machine next visit  Stop  Lisinopril and start new medication in 2 days

## 2013-03-17 ENCOUNTER — Other Ambulatory Visit: Payer: Self-pay | Admitting: Internal Medicine

## 2013-03-17 ENCOUNTER — Telehealth: Payer: Self-pay | Admitting: *Deleted

## 2013-03-17 MED ORDER — LOSARTAN POTASSIUM 25 MG PO TABS: 25.0000 mg | ORAL_TABLET | Freq: Every day | ORAL | Status: DC

## 2013-03-17 NOTE — Telephone Encounter (Signed)
[facility] did not receive this rx re-sent RX

## 2013-03-28 ENCOUNTER — Other Ambulatory Visit: Payer: Medicare Other

## 2013-04-02 ENCOUNTER — Other Ambulatory Visit: Payer: Self-pay | Admitting: Internal Medicine

## 2013-04-02 ENCOUNTER — Encounter: Payer: Self-pay | Admitting: Internal Medicine

## 2013-04-02 ENCOUNTER — Ambulatory Visit (INDEPENDENT_AMBULATORY_CARE_PROVIDER_SITE_OTHER): Payer: Medicare Other | Admitting: Internal Medicine

## 2013-04-02 VITALS — BP 138/77 | HR 70 | Temp 97.6°F | Resp 16 | Wt 136.0 lb

## 2013-04-02 DIAGNOSIS — I10 Essential (primary) hypertension: Secondary | ICD-10-CM | POA: Diagnosis not present

## 2013-04-02 DIAGNOSIS — R21 Rash and other nonspecific skin eruption: Secondary | ICD-10-CM

## 2013-04-02 LAB — BASIC METABOLIC PANEL
BUN: 10 mg/dL (ref 6–23)
CO2: 29 mEq/L (ref 19–32)
Calcium: 9.4 mg/dL (ref 8.4–10.5)
Chloride: 103 mEq/L (ref 96–112)
Creat: 0.79 mg/dL (ref 0.50–1.10)
Glucose, Bld: 69 mg/dL — ABNORMAL LOW (ref 70–99)
Potassium: 4 mEq/L (ref 3.5–5.3)
Sodium: 138 mEq/L (ref 135–145)

## 2013-04-02 MED ORDER — LOSARTAN POTASSIUM 50 MG PO TABS: 50.0000 mg | ORAL_TABLET | Freq: Every day | ORAL | Status: DC

## 2013-04-02 NOTE — Patient Instructions (Signed)
See me in 6 weeks

## 2013-04-02 NOTE — Progress Notes (Signed)
Subjective:    Patient ID: Suzanne Turner, female    DOB: Sep 23, 1945, 68 y.o.   MRN: [id number]  HPI  Suzanne Turner is here for follow up after initiating Losartan 25 mg.  She brings her home moniter and most SBP's 140-150.  She is asymptomatic.   Rash on lower legs improving.  She has follow up appt with [facility] derm in May  Allergies  Allergen Reactions  . Medrol [Methylprednisolone] Other (See Comments)    swelling  . Penicillins Rash  . Sulfonamide Derivatives Rash   Past Medical History  Diagnosis Date  . Hypertension   . Osteoarthritis   . Bronchitis   . Osteopenia   . High cholesterol     no medications  . Salivary duct stone   . Kidney stone   . Chronic eczema   . Wears glasses    Past Surgical History  Procedure Laterality Date  . Tonsillectomy  1949  . Tubal ligation  1977  . Salivary stone removal Right 05/15/2012    Procedure: EXCISION OF RIGHT SUBMANDIBULAR GLAND ;  Surgeon: Izora Gala, MD;  Location: [facility];  Service: ENT;  Laterality: Right;  . Orif tibia & fibula fractures  1966    rt  . Salivary stone removal N/A 11/18/2012    Procedure: INTRAORAL EXCISION SUBMANDIBULAR STONE RIGHT ;  Surgeon: Izora Gala, MD;  Location: [place];  Service: ENT;  Laterality: N/A;  . Skin biopsy     History   Social History  . Marital Status: Widowed    Spouse Name: N/A    Number of Children: N/A  . Years of Education: N/A   Occupational History  . Not on file.   Social History Main Topics  . Smoking status: Current Every Day Smoker -- 0.50 packs/day for 40 years    Types: Cigarettes  . Smokeless tobacco: Never Used  . Alcohol Use: No  . Drug Use: No  . Sexual Activity: Not Currently   Other Topics Concern  . Not on file   Social History Narrative  . No narrative on file   Family History  Problem Relation Age of Onset  . Colon cancer Mother   . Arrhythmia Mother   . Arrhythmia Brother   . Arrhythmia Maternal Uncle   . Arrhythmia  Maternal Grandmother   . Breast cancer Sister   . Colon cancer Maternal Aunt    Patient Active Problem List   Diagnosis Date Noted  . At risk for coronary artery disease 03/05/2013  . Salivary gland enlargement 02/14/2012  . Colon polyps 04/17/2011  . Tremor 04/17/2011  . History of renal stone 04/09/2011  . Hyperlipidemia 03/29/2011  . Family history of colon cancer 03/29/2011  . Family history of breast cancer in first degree relative 03/29/2011  . Palpitations 02/07/2011  . Chest pain 02/07/2011  . Smoking 02/07/2011  . CONSTIPATION 05/11/2008  . VITAMIN D DEFICIENCY 05/07/2008  . HYPERTENSION 05/07/2008  . OSTEOPENIA 05/07/2008   Current Outpatient Prescriptions on File Prior to Visit  Medication Sig Dispense Refill  . acetaminophen (TYLENOL) 325 MG tablet Take 325 mg by mouth daily as needed for pain.      Marland Kitchen aspirin EC 81 MG tablet Take 81 mg by mouth daily.        . calcium carbonate (OS-CAL) 600 MG TABS Take 600 mg by mouth 2 (two) times daily with a meal.      . metoprolol succinate (TOPROL-XL) 25 MG 24 hr tablet Take  25 mg by mouth 2 (two) times daily.       . Multiple Vitamins-Minerals (HAIR/SKIN/NAILS/BIOTIN PO) Take 500 mg by mouth 3 (three) times daily.      Marland Kitchen nystatin (MYCOSTATIN/NYSTOP) 100000 UNIT/GM POWD Apply to corners of mouth bid  30 Bottle  0   No current facility-administered medications on file prior to visit.      Review of Systems    see HPI Objective:   Physical Exam Physical Exam  Nursing note and vitals reviewed. Repeat BP  136/80  Constitutional: She is oriented to person, place, and time. She appears well-developed and well-nourished.  HENT:  Head: Normocephalic and atraumatic.  Cardiovascular: Normal rate and regular rhythm. Exam reveals no gallop and no friction rub.  No murmur heard.  Pulmonary/Chest: Breath sounds normal. She has no wheezes. She has no rales.  Neurological: She is alert and oriented to person, place, and time.    Skin: Skin is warm and dry. Le Rash improving  No edema Psychiatric: She has a normal mood and affect. Her behavior is normal.              Assessment & Plan:  HTN  On low dose losartan  Will increase to 50 mg daily.    Check K today  Probable drug eruption rash  Improving off ACE  Has follow up with [facility] in May   See me in 4-6 weeks

## 2013-04-03 ENCOUNTER — Encounter: Payer: Self-pay | Admitting: *Deleted

## 2013-04-03 DIAGNOSIS — H04129 Dry eye syndrome of unspecified lacrimal gland: Secondary | ICD-10-CM | POA: Diagnosis not present

## 2013-04-03 DIAGNOSIS — H40059 Ocular hypertension, unspecified eye: Secondary | ICD-10-CM | POA: Diagnosis not present

## 2013-04-03 DIAGNOSIS — H16109 Unspecified superficial keratitis, unspecified eye: Secondary | ICD-10-CM | POA: Diagnosis not present

## 2013-04-03 DIAGNOSIS — H40019 Open angle with borderline findings, low risk, unspecified eye: Secondary | ICD-10-CM | POA: Diagnosis not present

## 2013-04-11 ENCOUNTER — Ambulatory Visit (INDEPENDENT_AMBULATORY_CARE_PROVIDER_SITE_OTHER)
Admission: RE | Admit: 2013-04-11 | Discharge: 2013-04-11 | Disposition: A | Discharge status: Home / Self Care | Payer: Self-pay | Source: Ambulatory Visit | Attending: Cardiology | Admitting: Cardiology

## 2013-04-11 DIAGNOSIS — Z8249 Family history of ischemic heart disease and other diseases of the circulatory system: Secondary | ICD-10-CM

## 2013-04-16 ENCOUNTER — Encounter: Payer: Self-pay | Admitting: Internal Medicine

## 2013-04-17 ENCOUNTER — Telehealth: Payer: Self-pay | Admitting: *Deleted

## 2013-04-17 NOTE — Telephone Encounter (Signed)
LMTCB

## 2013-04-17 NOTE — Telephone Encounter (Signed)
Notes Recorded by Larey Dresser, MD on 04/16/2013 at 11:07 PM Coronary artery calcium score 8.5, 49th percentile for her age/gender. This suggests intermediate risk for future cardiac events. Would not be wrong to take a statin, but calcium scan was only intermediate risk so reasonable to hold off.

## 2013-04-17 NOTE — Telephone Encounter (Signed)
Pt.notified

## 2013-04-17 NOTE — Telephone Encounter (Signed)
Spoke with patient about Coronary Artery CT scan results.

## 2013-04-17 NOTE — Telephone Encounter (Signed)
Patient is returning your call. Please call back.  °

## 2013-05-14 ENCOUNTER — Ambulatory Visit (INDEPENDENT_AMBULATORY_CARE_PROVIDER_SITE_OTHER): Payer: Medicare Other | Admitting: Internal Medicine

## 2013-05-14 ENCOUNTER — Encounter: Payer: Self-pay | Admitting: Internal Medicine

## 2013-05-14 VITALS — BP 147/80 | HR 72 | Temp 97.7°F | Resp 18 | Wt 133.0 lb

## 2013-05-14 DIAGNOSIS — R21 Rash and other nonspecific skin eruption: Secondary | ICD-10-CM | POA: Diagnosis not present

## 2013-05-14 DIAGNOSIS — I10 Essential (primary) hypertension: Secondary | ICD-10-CM

## 2013-05-14 NOTE — Progress Notes (Signed)
Subjective:    Patient ID: Suzanne Turner, female    DOB: 1945/11/22, 68 y.o.   MRN: [id number]  HPI Suzanne Turner is here for follow up  I increased her dose of Losartan to 50 mg.  She did not take her BP med this am.  Tolerating well  She is asymptomatic  Leg rash minimally improved.  She has follow up with Derm next month. She may be referred to an allergist by the dermatologist    Allergies  Allergen Reactions  . Medrol [Methylprednisolone] Other (See Comments)    swelling  . Penicillins Rash  . Sulfonamide Derivatives Rash   Past Medical History  Diagnosis Date  . Hypertension   . Osteoarthritis   . Bronchitis   . Osteopenia   . High cholesterol     no medications  . Salivary duct stone   . Kidney stone   . Chronic eczema   . Wears glasses    Past Surgical History  Procedure Laterality Date  . Tonsillectomy  1949  . Tubal ligation  1977  . Salivary stone removal Right 05/15/2012    Procedure: EXCISION OF RIGHT SUBMANDIBULAR GLAND ;  Surgeon: Izora Gala, MD;  Location: [facility];  Service: ENT;  Laterality: Right;  . Orif tibia & fibula fractures  1966    rt  . Salivary stone removal N/A 11/18/2012    Procedure: INTRAORAL EXCISION SUBMANDIBULAR STONE RIGHT ;  Surgeon: Izora Gala, MD;  Location: [facility];  Service: ENT;  Laterality: N/A;  . Skin biopsy     History   Social History  . Marital Status: Widowed    Spouse Name: N/A    Number of Children: N/A  . Years of Education: N/A   Occupational History  . Not on file.   Social History Main Topics  . Smoking status: Current Every Day Smoker -- 0.50 packs/day for 40 years    Types: Cigarettes  . Smokeless tobacco: Never Used  . Alcohol Use: No  . Drug Use: No  . Sexual Activity: Not Currently   Other Topics Concern  . Not on file   Social History Narrative  . No narrative on file   Family History  Problem Relation Age of Onset  . Colon cancer Mother   . Arrhythmia Mother   .  Arrhythmia Brother   . Arrhythmia Maternal Uncle   . Arrhythmia Maternal Grandmother   . Breast cancer Sister   . Colon cancer Maternal Aunt    Patient Active Problem List   Diagnosis Date Noted  . At risk for coronary artery disease 03/05/2013  . Salivary gland enlargement 02/14/2012  . Colon polyps 04/17/2011  . Tremor 04/17/2011  . History of renal stone 04/09/2011  . Hyperlipidemia 03/29/2011  . Family history of colon cancer 03/29/2011  . Family history of breast cancer in first degree relative 03/29/2011  . Palpitations 02/07/2011  . Chest pain 02/07/2011  . Smoking 02/07/2011  . CONSTIPATION 05/11/2008  . VITAMIN D DEFICIENCY 05/07/2008  . HYPERTENSION 05/07/2008  . OSTEOPENIA 05/07/2008   Current Outpatient Prescriptions on File Prior to Visit  Medication Sig Dispense Refill  . acetaminophen (TYLENOL) 325 MG tablet Take 325 mg by mouth daily as needed for pain.      Marland Kitchen aspirin EC 81 MG tablet Take 81 mg by mouth daily.        . calcium carbonate (OS-CAL) 600 MG TABS Take 600 mg by mouth 2 (two) times daily with a meal.      .  losartan (COZAAR) 50 MG tablet Take 1 tablet (50 mg total) by mouth daily.  30 tablet  1  . metoprolol succinate (TOPROL-XL) 25 MG 24 hr tablet Take 25 mg by mouth 2 (two) times daily.       . Multiple Vitamins-Minerals (HAIR/SKIN/NAILS/BIOTIN PO) Take 500 mg by mouth 3 (three) times daily.      Marland Kitchen triamcinolone ointment (KENALOG) 0.1 % Apply 1 application topically 2 (two) times daily.      Marland Kitchen nystatin (MYCOSTATIN/NYSTOP) 100000 UNIT/GM POWD Apply to corners of mouth bid  30 Bottle  0   No current facility-administered medications on file prior to visit.      Review of Systems See HPI    Objective:   Physical Exam Physical Exam  Nursing note and vitals reviewed.   Repeat BP  130/82 Constitutional: She is oriented to person, place, and time. She appears well-developed and well-nourished.  HENT:  Head: Normocephalic and atraumatic.    Cardiovascular: Normal rate and regular rhythm. Exam reveals no gallop and no friction rub.  No murmur heard.  Pulmonary/Chest: Breath sounds normal. She has no wheezes. She has no rales.  Neurological: She is alert and oriented to person, place, and time.  Skin: Skin is warm and dry.  Scaly maculopapular rash R lower extremity worse than left Psychiatric: She has a normal mood and affect. Her behavior is normal.       Assessment & Plan:  HTN   Continue Losartan  Skin rash  Followed by derm  See me as needed

## 2013-05-19 ENCOUNTER — Other Ambulatory Visit: Payer: Self-pay | Admitting: Internal Medicine

## 2013-05-20 NOTE — Telephone Encounter (Signed)
Refill request

## 2013-06-17 ENCOUNTER — Encounter: Payer: Self-pay | Admitting: Internal Medicine

## 2013-06-18 ENCOUNTER — Other Ambulatory Visit: Payer: Self-pay | Admitting: Cardiology

## 2013-06-23 ENCOUNTER — Telehealth: Payer: Self-pay | Admitting: *Deleted

## 2013-06-23 NOTE — Telephone Encounter (Signed)
Needs her blood pressure medicine refilled.  Please call her and let her know when it has been sent in.

## 2013-06-23 NOTE — Telephone Encounter (Signed)
Pt states that she is not tolerating the cozaar well would like to change her BP medication. Appt made for 06/04

## 2013-06-23 NOTE — Telephone Encounter (Signed)
error 

## 2013-06-26 ENCOUNTER — Encounter: Payer: Self-pay | Admitting: Internal Medicine

## 2013-06-26 ENCOUNTER — Ambulatory Visit (INDEPENDENT_AMBULATORY_CARE_PROVIDER_SITE_OTHER): Payer: Medicare Other | Admitting: Internal Medicine

## 2013-06-26 VITALS — BP 161/82 | HR 86 | Temp 97.7°F | Resp 18 | Wt 136.0 lb

## 2013-06-26 DIAGNOSIS — I10 Essential (primary) hypertension: Secondary | ICD-10-CM | POA: Diagnosis not present

## 2013-06-26 MED ORDER — TRIAMTERENE-HCTZ 37.5-25 MG PO CAPS: 1.0000 | ORAL_CAPSULE | Freq: Every day | ORAL | Status: DC

## 2013-06-26 NOTE — Progress Notes (Signed)
Subjective:    Patient ID: Suzanne Turner, female    DOB: [birth year], 68 y.o.   MRN: [id number]  HPI  Suzanne Turner is here for acute visit.    She stopped her Cozaar on 5/31 as she felt it was giving her significant belching and tiredness  She would like to try something different for her BP  Psoriatic type leg rash./ ?? Drug related:   Minimal imporvement  Off ACE  Allergies  Allergen Reactions  . Medrol [Methylprednisolone] Other (See Comments)    swelling  . Penicillins Rash  . Sulfonamide Derivatives Rash   Past Medical History  Diagnosis Date  . Hypertension   . Osteoarthritis   . Bronchitis   . Osteopenia   . High cholesterol     no medications  . Salivary duct stone   . Kidney stone   . Chronic eczema   . Wears glasses    Past Surgical History  Procedure Laterality Date  . Tonsillectomy  1949  . Tubal ligation  1977  . Salivary stone removal Right 05/15/2012    Procedure: EXCISION OF RIGHT SUBMANDIBULAR GLAND ;  Surgeon: Izora Gala, MD;  Location: [facility];  Service: ENT;  Laterality: Right;  . Orif tibia & fibula fractures  1966    rt  . Salivary stone removal N/A 11/18/2012    Procedure: INTRAORAL EXCISION SUBMANDIBULAR STONE RIGHT ;  Surgeon: Izora Gala, MD;  Location: [place];  Service: ENT;  Laterality: N/A;  . Skin biopsy     History   Social History  . Marital Status: Widowed    Spouse Name: N/A    Number of Children: N/A  . Years of Education: N/A   Occupational History  . Not on file.   Social History Main Topics  . Smoking status: Current Every Day Smoker -- 0.50 packs/day for 40 years    Types: Cigarettes  . Smokeless tobacco: Never Used  . Alcohol Use: No  . Drug Use: No  . Sexual Activity: Not Currently   Other Topics Concern  . Not on file   Social History Narrative  . No narrative on file   Family History  Problem Relation Age of Onset  . Colon cancer Mother   . Arrhythmia Mother   . Arrhythmia Brother   .  Arrhythmia Maternal Uncle   . Arrhythmia Maternal Grandmother   . Breast cancer Sister   . Colon cancer Maternal Aunt    Patient Active Problem List   Diagnosis Date Noted  . At risk for coronary artery disease 03/05/2013  . Salivary gland enlargement 02/14/2012  . Colon polyps 04/17/2011  . Tremor 04/17/2011  . History of renal stone 04/09/2011  . Hyperlipidemia 03/29/2011  . Family history of colon cancer 03/29/2011  . Family history of breast cancer in first degree relative 03/29/2011  . Palpitations 02/07/2011  . Chest pain 02/07/2011  . Smoking 02/07/2011  . CONSTIPATION 05/11/2008  . VITAMIN D DEFICIENCY 05/07/2008  . HYPERTENSION 05/07/2008  . OSTEOPENIA 05/07/2008   Current Outpatient Prescriptions on File Prior to Visit  Medication Sig Dispense Refill  . acetaminophen (TYLENOL) 325 MG tablet Take 325 mg by mouth daily as needed for pain.      Marland Kitchen aspirin EC 81 MG tablet Take 81 mg by mouth daily.        . calcium carbonate (OS-CAL) 600 MG TABS Take 600 mg by mouth 2 (two) times daily with a meal.      .  metoprolol succinate (TOPROL-XL) 50 MG 24 hr tablet TAKE 1 TABLET BY MOUTH DAILY WITH OR IMMEDIATELY FOLLOWING A MEAL  30 tablet  1  . Multiple Vitamins-Minerals (HAIR/SKIN/NAILS/BIOTIN PO) Take 500 mg by mouth 3 (three) times daily.      Marland Kitchen nystatin (MYCOSTATIN/NYSTOP) 100000 UNIT/GM POWD Apply to corners of mouth bid  30 Bottle  0  . triamcinolone ointment (KENALOG) 0.1 % Apply 1 application topically 2 (two) times daily.      Marland Kitchen losartan (COZAAR) 50 MG tablet TAKE 1 TABLET BY MOUTH EVERY DAY  90 tablet  1   No current facility-administered medications on file prior to visit.       Review of Systems See HPI    Objective:   Physical Exam Physical Exam  Nursing note and vitals reviewed.   Repeat BP  154/78 Constitutional: She is oriented to person, place, and time. She appears well-developed and well-nourished.  HENT:  Head: Normocephalic and atraumatic.    Cardiovascular: Normal rate and regular rhythm. Exam reveals no gallop and no friction rub.  No murmur heard.  Pulmonary/Chest: Breath sounds normal. She has no wheezes. She has no rales.  Neurological: She is alert and oriented to person, place, and time.  Skin: Skin is warm and dry.  Psychiatric: She has a normal mood and affect. Her behavior is normal.        Assessment & Plan:  HTN  She is willing to try a diuretic  Will start Maxzide one daily  Continue metoprolol  ?? Drug related rash:    She is off  ACE  Some improvement    See me in 4-5 weeks

## 2013-06-30 DIAGNOSIS — H409 Unspecified glaucoma: Secondary | ICD-10-CM | POA: Diagnosis not present

## 2013-06-30 DIAGNOSIS — H4011X0 Primary open-angle glaucoma, stage unspecified: Secondary | ICD-10-CM | POA: Diagnosis not present

## 2013-06-30 DIAGNOSIS — H40019 Open angle with borderline findings, low risk, unspecified eye: Secondary | ICD-10-CM | POA: Diagnosis not present

## 2013-07-23 ENCOUNTER — Ambulatory Visit (INDEPENDENT_AMBULATORY_CARE_PROVIDER_SITE_OTHER): Payer: Medicare Other | Admitting: Internal Medicine

## 2013-07-23 ENCOUNTER — Encounter: Payer: Self-pay | Admitting: Internal Medicine

## 2013-07-23 VITALS — BP 146/86 | HR 71 | Resp 16 | Ht 67.0 in | Wt 134.0 lb

## 2013-07-23 DIAGNOSIS — F17200 Nicotine dependence, unspecified, uncomplicated: Secondary | ICD-10-CM

## 2013-07-23 DIAGNOSIS — I10 Essential (primary) hypertension: Secondary | ICD-10-CM | POA: Diagnosis not present

## 2013-07-23 DIAGNOSIS — R21 Rash and other nonspecific skin eruption: Secondary | ICD-10-CM | POA: Insufficient documentation

## 2013-07-23 MED ORDER — HYDROCHLOROTHIAZIDE 25 MG PO TABS: ORAL_TABLET | ORAL | Status: DC

## 2013-07-23 NOTE — Progress Notes (Signed)
Subjective:    Patient ID: Suzanne Turner, female    DOB: [birth year], 68 y.o.   MRN: [id number]  HPI Lara Mulch is here for follow up of her htn.  I placed her on Maxide at last visit.   She has been adjusting her meds herself, cutting toprol in 1/2 ,  Stopping maxide and taking once a week without suprevision from my office .  She says her BP went al low as "108"   She does not bring any recordings.    Toprol given to her for palpitations by her cardiologist  Allergies  Allergen Reactions  . Medrol [Methylprednisolone] Other (See Comments)    swelling  . Penicillins Rash  . Sulfonamide Derivatives Rash   Past Medical History  Diagnosis Date  . Hypertension   . Osteoarthritis   . Bronchitis   . Osteopenia   . High cholesterol     no medications  . Salivary duct stone   . Kidney stone   . Chronic eczema   . Wears glasses    Past Surgical History  Procedure Laterality Date  . Tonsillectomy  1949  . Tubal ligation  1977  . Salivary stone removal Right 05/15/2012    Procedure: EXCISION OF RIGHT SUBMANDIBULAR GLAND ;  Surgeon: Izora Gala, MD;  Location: [facility];  Service: ENT;  Laterality: Right;  . Orif tibia & fibula fractures  1966    rt  . Salivary stone removal N/A 11/18/2012    Procedure: INTRAORAL EXCISION SUBMANDIBULAR STONE RIGHT ;  Surgeon: Izora Gala, MD;  Location: [facility];  Service: ENT;  Laterality: N/A;  . Skin biopsy     History   Social History  . Marital Status: Widowed    Spouse Name: N/A    Number of Children: N/A  . Years of Education: N/A   Occupational History  . Not on file.   Social History Main Topics  . Smoking status: Current Every Day Smoker -- 0.50 packs/day for 40 years    Types: Cigarettes  . Smokeless tobacco: Never Used  . Alcohol Use: No  . Drug Use: No  . Sexual Activity: Not Currently   Other Topics Concern  . Not on file   Social History Narrative  . No narrative on file   Family History  Problem  Relation Age of Onset  . Colon cancer Mother   . Arrhythmia Mother   . Arrhythmia Brother   . Arrhythmia Maternal Uncle   . Arrhythmia Maternal Grandmother   . Breast cancer Sister   . Colon cancer Maternal Aunt    Patient Active Problem List   Diagnosis Date Noted  . At risk for coronary artery disease 03/05/2013  . Salivary gland enlargement 02/14/2012  . Colon polyps 04/17/2011  . Tremor 04/17/2011  . History of renal stone 04/09/2011  . Hyperlipidemia 03/29/2011  . Family history of colon cancer 03/29/2011  . Family history of breast cancer in first degree relative 03/29/2011  . Palpitations 02/07/2011  . Chest pain 02/07/2011  . Smoking 02/07/2011  . CONSTIPATION 05/11/2008  . VITAMIN D DEFICIENCY 05/07/2008  . HYPERTENSION 05/07/2008  . OSTEOPENIA 05/07/2008   Current Outpatient Prescriptions on File Prior to Visit  Medication Sig Dispense Refill  . acetaminophen (TYLENOL) 325 MG tablet Take 325 mg by mouth daily as needed for pain.      Marland Kitchen aspirin EC 81 MG tablet Take 81 mg by mouth daily.        . calcium  carbonate (OS-CAL) 600 MG TABS Take 600 mg by mouth 2 (two) times daily with a meal.      . losartan (COZAAR) 50 MG tablet TAKE 1 TABLET BY MOUTH EVERY DAY  90 tablet  1  . metoprolol succinate (TOPROL-XL) 50 MG 24 hr tablet TAKE 1 TABLET BY MOUTH DAILY WITH OR IMMEDIATELY FOLLOWING A MEAL  30 tablet  1  . Multiple Vitamins-Minerals (HAIR/SKIN/NAILS/BIOTIN PO) Take 500 mg by mouth 3 (three) times daily.      Marland Kitchen nystatin (MYCOSTATIN/NYSTOP) 100000 UNIT/GM POWD Apply to corners of mouth bid  30 Bottle  0  . triamcinolone ointment (KENALOG) 0.1 % Apply 1 application topically 2 (two) times daily.      Marland Kitchen triamterene-hydrochlorothiazide (DYAZIDE) 37.5-25 MG per capsule Take 1 each (1 capsule total) by mouth daily.  30 capsule  1   No current facility-administered medications on file prior to visit.       Review of Systems    see HPI Objective:   Physical  Exam Physical Exam  Nursing note and vitals reviewed.  Repeat Bp  136/74 Constitutional: She is oriented to person, place, and time. She appears well-developed and well-nourished.  HENT:  Head: Normocephalic and atraumatic.  Cardiovascular: Normal rate and regular rhythm. Exam reveals no gallop and no friction rub.  No murmur heard.  Pulmonary/Chest: Breath sounds normal. She has no wheezes. She has no rales.  Neurological: She is alert and oriented to person, place, and time.  Skin: Skin is warm and dry.  Psychiatric: She has a normal mood and affect. Her behavior is normal.              Assessment & Plan:  HTN:  Pt is very medication averse and she will self medicate wlith different doses.  I will stop Maxide and she can take 1/2 of HCTZ two times a week.   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

## 2013-07-23 NOTE — Patient Instructions (Signed)
See me in 3 months

## 2013-07-28 DIAGNOSIS — H40059 Ocular hypertension, unspecified eye: Secondary | ICD-10-CM | POA: Diagnosis not present

## 2013-07-28 DIAGNOSIS — H4011X0 Primary open-angle glaucoma, stage unspecified: Secondary | ICD-10-CM | POA: Diagnosis not present

## 2013-07-28 DIAGNOSIS — H409 Unspecified glaucoma: Secondary | ICD-10-CM | POA: Diagnosis not present

## 2013-07-28 DIAGNOSIS — H04129 Dry eye syndrome of unspecified lacrimal gland: Secondary | ICD-10-CM | POA: Diagnosis not present

## 2013-08-01 ENCOUNTER — Ambulatory Visit (AMBULATORY_SURGERY_CENTER): Payer: Self-pay | Admitting: *Deleted

## 2013-08-01 VITALS — Ht 67.0 in | Wt 135.4 lb

## 2013-08-01 DIAGNOSIS — Z800 Family history of malignant neoplasm of digestive organs: Secondary | ICD-10-CM

## 2013-08-01 MED ORDER — MOVIPREP 100 G PO SOLR: ORAL | Status: DC

## 2013-08-01 NOTE — Progress Notes (Signed)
No allergies to eggs or soy. No problems with anesthesia.  Pt given Emmi instructions for colonoscopy  No oxygen use  No diet drug use  PT WANTS COLONOSCOPY WITH NO SEDATION.  LAST COLONOSCOPY WITH DR Olevia Perches WAS WITHOUT SEDATION

## 2013-08-06 DIAGNOSIS — L259 Unspecified contact dermatitis, unspecified cause: Secondary | ICD-10-CM | POA: Diagnosis not present

## 2013-08-15 ENCOUNTER — Encounter: Payer: Self-pay | Admitting: Internal Medicine

## 2013-08-15 ENCOUNTER — Ambulatory Visit (AMBULATORY_SURGERY_CENTER): Payer: Medicare Other | Admitting: Internal Medicine

## 2013-08-15 VITALS — BP 147/96 | HR 73 | Temp 97.2°F | Resp 16 | Ht 67.0 in | Wt 135.0 lb

## 2013-08-15 DIAGNOSIS — Z800 Family history of malignant neoplasm of digestive organs: Secondary | ICD-10-CM | POA: Diagnosis not present

## 2013-08-15 DIAGNOSIS — Z86010 Personal history of colonic polyps: Secondary | ICD-10-CM | POA: Diagnosis not present

## 2013-08-15 DIAGNOSIS — I10 Essential (primary) hypertension: Secondary | ICD-10-CM | POA: Diagnosis not present

## 2013-08-15 MED ORDER — SODIUM CHLORIDE 0.9 % IV SOLN: 500.0000 mL | INTRAVENOUS | Status: DC

## 2013-08-15 NOTE — Progress Notes (Signed)
Procedure ends, to recovery, report given.

## 2013-08-15 NOTE — Progress Notes (Signed)
1030 - Pt prefers no sedation. I will monitor and standby as needed.

## 2013-08-15 NOTE — Op Note (Signed)
[facility] [place]  [address] [place], [id number]   COLONOSCOPY PROCEDURE REPORT  PATIENT: Marguerita, Stapp  MR#: [id number] BIRTHDATE: 04-12-1945 , 68  yrs. old GENDER: Female ENDOSCOPIST: Lafayette Dragon, MD REFERRED OM:BTDHRCB Coralyn Mark, M.D. PROCEDURE DATE:  08/15/2013 PROCEDURE:   Colonoscopy, screening First Screening Colonoscopy - Avg.  risk and is 50 yrs.  old or older - No.  Prior Negative Screening - Now for repeat screening. Above average risk  History of Adenoma - Now for follow-up colonoscopy & has been > or = to 3 yrs.  Yes hx of adenoma.  Has been 3 or more years since last colonoscopy.  Polyps Removed Today? No.  Recommend repeat exam, <10 yrs? Yes.  High risk (family or personal hx). ASA CLASS:   Class I INDICATIONS:mother with colon cancer at age 80, prior colonoscopies in 2000, 2005, and June 2010.  History of colon polyps ( don't have records from [place]). MEDICATIONS: None (none)  DESCRIPTION OF PROCEDURE:   After the risks benefits and alternatives of the procedure were thoroughly explained, informed consent was obtained.  A digital rectal exam revealed no abnormalities of the rectum.   The [id number]  endoscope was introduced through the anus and advanced to the cecum, which was identified by both the appendix and ileocecal valve. No adverse events experienced.   The quality of the prep was excellent, using MoviPrep  The instrument was then slowly withdrawn as the colon was fully examined.      COLON FINDINGS: A normal appearing cecum, ileocecal valve, and appendiceal orifice were identified.  The ascending, hepatic flexure, transverse, splenic flexure, descending, sigmoid colon and rectum appeared unremarkable.  No polyps or cancers were seen. Retroflexed views revealed no abnormalities. The time to cecum=9 minutes 51 seconds.  Withdrawal time=7 minutes 00 seconds.  The scope was withdrawn and the procedure  completed. COMPLICATIONS: There were no complications.  ENDOSCOPIC IMPRESSION: Normal colon  RECOMMENDATIONS: high-fiber diet Recall colonoscopy in 5 years   eSigned:  Lafayette Dragon, MD 08/15/2013 11:06 AM   cc:   PATIENT NAME:  Breiana, Stratmann MR#: [id number]

## 2013-08-15 NOTE — Patient Instructions (Signed)
YOU HAD AN ENDOSCOPIC PROCEDURE TODAY AT THE [facility]: Refer to the procedure report that was given to you for any specific questions about what was found during the examination.  If the procedure report does not answer your questions, please call your gastroenterologist to clarify.  If you requested that your care partner not be given the details of your procedure findings, then the procedure report has been included in a sealed envelope for you to review at your convenience later.  YOU SHOULD EXPECT: Some feelings of bloating in the abdomen. Passage of more gas than usual.  Walking can help get rid of the air that was put into your GI tract during the procedure and reduce the bloating. If you had a lower endoscopy (such as a colonoscopy or flexible sigmoidoscopy) you may notice spotting of blood in your stool or on the toilet paper. If you underwent a bowel prep for your procedure, then you may not have a normal bowel movement for a few days.  DIET: Your first meal following the procedure should be a light meal and then it is ok to progress to your normal diet.  A half-sandwich or bowl of soup is an example of a good first meal.  Heavy or fried foods are harder to digest and may make you feel nauseous or bloated.  Likewise meals heavy in dairy and vegetables can cause extra gas to form and this can also increase the bloating.  Drink plenty of fluids but you should avoid alcoholic beverages for 24 hours.  ACTIVITY: Your care partner should take you home directly after the procedure.  You should plan to take it easy, moving slowly for the rest of the day.  You can resume normal activity the day after the procedure however you should NOT DRIVE or use heavy machinery for 24 hours (because of the sedation medicines used during the test).    SYMPTOMS TO REPORT IMMEDIATELY: A gastroenterologist can be reached at any hour.  During normal business hours, 8:30 AM to 5:00 PM Monday through Friday,  call [phone].  After hours and on weekends, please call the GI answering service at [phone] who will take a message and have the physician on call contact you.   Following lower endoscopy (colonoscopy or flexible sigmoidoscopy):  Excessive amounts of blood in the stool  Significant tenderness or worsening of abdominal pains  Swelling of the abdomen that is new, acute  Fever of 100F or higher    FOLLOW UP: If any biopsies were taken you will be contacted by phone or by letter within the next 1-3 weeks.  Call your gastroenterologist if you have not heard about the biopsies in 3 weeks.  Our staff will call the home number listed on your records the next business day following your procedure to check on you and address any questions or concerns that you may have at that time regarding the information given to you following your procedure. This is a courtesy call and so if there is no answer at the home number and we have not heard from you through the emergency physician on call, we will assume that you have returned to your regular daily activities without incident.  SIGNATURES/CONFIDENTIALITY: You and/or your care partner have signed paperwork which will be entered into your electronic medical record.  These signatures attest to the fact that that the information above on your After Visit Summary has been reviewed and is understood.  Full responsibility of the confidentiality   of this discharge information lies with you and/or your care-partner.  Normal colonscopy, repeat in 5 years-2020.  High fiber diet information given.

## 2013-08-18 ENCOUNTER — Telehealth: Payer: Self-pay | Admitting: *Deleted

## 2013-08-18 NOTE — Telephone Encounter (Signed)
No answer, message left for the patient. 

## 2013-10-09 ENCOUNTER — Other Ambulatory Visit: Payer: Self-pay | Admitting: Internal Medicine

## 2013-10-09 NOTE — Telephone Encounter (Signed)
Refill request for HCTZ 

## 2013-10-10 DIAGNOSIS — H4011X0 Primary open-angle glaucoma, stage unspecified: Secondary | ICD-10-CM | POA: Diagnosis not present

## 2013-10-10 DIAGNOSIS — H40059 Ocular hypertension, unspecified eye: Secondary | ICD-10-CM | POA: Diagnosis not present

## 2013-10-10 DIAGNOSIS — H409 Unspecified glaucoma: Secondary | ICD-10-CM | POA: Diagnosis not present

## 2013-10-10 DIAGNOSIS — H04129 Dry eye syndrome of unspecified lacrimal gland: Secondary | ICD-10-CM | POA: Diagnosis not present

## 2013-10-22 ENCOUNTER — Encounter: Payer: Self-pay | Admitting: Internal Medicine

## 2013-10-22 ENCOUNTER — Ambulatory Visit (INDEPENDENT_AMBULATORY_CARE_PROVIDER_SITE_OTHER): Payer: Medicare Other | Admitting: Internal Medicine

## 2013-10-22 VITALS — BP 130/84 | HR 69 | Temp 97.6°F | Resp 16 | Ht 67.0 in | Wt 134.0 lb

## 2013-10-22 DIAGNOSIS — R21 Rash and other nonspecific skin eruption: Secondary | ICD-10-CM | POA: Diagnosis not present

## 2013-10-22 DIAGNOSIS — I10 Essential (primary) hypertension: Secondary | ICD-10-CM | POA: Diagnosis not present

## 2013-10-22 MED ORDER — HYDROCHLOROTHIAZIDE 25 MG PO TABS: ORAL_TABLET | ORAL | Status: DC

## 2013-10-22 NOTE — Patient Instructions (Signed)
Schedule CPE

## 2013-10-22 NOTE — Progress Notes (Signed)
Subjective:    Patient ID: Suzanne Turner, female    DOB: 09-08-1945, 68 y.o.   MRN: [id number]  HPI Prior OV: HTN: Pt is very medication averse and she will self medicate wlith different doses. I will stop Maxide and she can take 1/2 of HCTZ two times a week. 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  She brings home moniter and SBP runing  120's to 130's.    !27/89  She feels well  Drug eruption rash  Improving.   Nearly clear on left leg and left arm   Allergies  Allergen Reactions  . Medrol [Methylprednisolone] Other (See Comments)    swelling  . Lisinopril Rash  . Penicillins Rash  . Sulfonamide Derivatives Rash   Past Medical History  Diagnosis Date  . Hypertension   . Osteoarthritis   . Bronchitis   . Osteopenia   . High cholesterol     no medications  . Salivary duct stone   . Kidney stone   . Chronic eczema   . Wears glasses   . Glaucoma     Left eye  . Cataract    Past Surgical History  Procedure Laterality Date  . Tonsillectomy  1949  . Tubal ligation  1977  . Salivary stone removal Right 05/15/2012    Procedure: EXCISION OF RIGHT SUBMANDIBULAR GLAND ;  Surgeon: Izora Gala, MD;  Location: [facility] OR;  Service: ENT;  Laterality: Right;  . Orif tibia & fibula fractures Right 1966  . Salivary stone removal N/A 11/18/2012    Procedure: INTRAORAL EXCISION SUBMANDIBULAR STONE RIGHT ;  Surgeon: Izora Gala, MD;  Location: [facility];  Service: ENT;  Laterality: N/A;  . Skin biopsy Right 12/2012    legs   History   Social History  . Marital Status: Widowed    Spouse Name: N/A    Number of Children: N/A  . Years of Education: N/A   Occupational History  . Not on file.     Social History Main Topics  . Smoking status: Current Every Day Smoker -- 0.50 packs/day for 40 years    Types: Cigarettes  . Smokeless tobacco: Never Used  . Alcohol Use: No  . Drug Use: No  . Sexual Activity: Not Currently   Other Topics Concern  . Not on file   Social History Narrative  . No narrative on file   Family History  Problem Relation Age of Onset  . Arrhythmia Mother   . Colon cancer Mother 23  . Arrhythmia Brother   . Arrhythmia Maternal Uncle   . Arrhythmia Maternal Grandmother   . Breast cancer Sister   . Colon polyps Maternal Aunt    Patient Active Problem List   Diagnosis Date Noted  . Rash and nonspecific skin eruption 07/23/2013  . At risk for coronary artery disease 03/05/2013  . Salivary gland enlargement 02/14/2012  . Colon polyps 04/17/2011  . Tremor 04/17/2011  . History of renal stone 04/09/2011  . Hyperlipidemia 03/29/2011  . Family history of colon cancer 03/29/2011  . Family history of breast cancer in first degree relative 03/29/2011  . Palpitations 02/07/2011  . Chest pain 02/07/2011  .  Smoking 02/07/2011  . CONSTIPATION 05/11/2008  . VITAMIN D DEFICIENCY 05/07/2008  . HYPERTENSION 05/07/2008  . OSTEOPENIA 05/07/2008   Current Outpatient Prescriptions on File Prior to Visit  Medication Sig Dispense Refill  . acetaminophen (TYLENOL) 325 MG tablet Take 325 mg by mouth daily as needed for pain.      Marland Kitchen aspirin EC 81 MG tablet Take 81 mg by mouth daily.        . calcium carbonate (OS-CAL) 600 MG TABS Take 600 mg by mouth 2 (two) times daily with a meal.      . hydrochlorothiazide (HYDRODIURIL) 25 MG tablet TAKE 1/2 TABLET BY MOUTH TWICE WEEKLY  24 tablet  0  . metoprolol succinate (TOPROL-XL) 50 MG 24 hr tablet TAKE 1 TABLET BY MOUTH DAILY WITH OR IMMEDIATELY FOLLOWING A MEAL  30 tablet  1  . Multiple Vitamins-Minerals (HAIR/SKIN/NAILS/BIOTIN PO) Take 500 mg by mouth 3 (three) times daily.      . timolol (TIMOPTIC) 0.5 % ophthalmic  solution Place 1 drop into the left eye 2 (two) times daily.      Marland Kitchen triamcinolone ointment (KENALOG) 0.1 % Apply 1 application topically 2 (two) times daily.       No current facility-administered medications on file prior to visit.       Review of Systems    see HPI Objective:   Physical Exam Physical Exam  Nursing note and vitals reviewed.  Constitutional: She is oriented to person, place, and time. She appears well-developed and well-nourished.  HENT:  Head: Normocephalic and atraumatic.  Cardiovascular: Normal rate and regular rhythm. Exam reveals no gallop and no friction rub.  No murmur heard.  Pulmonary/Chest: Breath sounds normal. She has no wheezes. She has no rales.  Neurological: She is alert and oriented to person, place, and time.  Skin: Skin is warm and dry.  Psychiatric: She has a normal mood and affect. Her behavior is normal.        Assessment & Plan:  HTN  Continue HCTZ 3 times a week and Toprol  She is very medication averse.  Advised to be sure not to change doses without checking with MD  Check labs  Psoriatic drug eruption :    Improving  Schedule  cpE

## 2013-11-05 DIAGNOSIS — Z23 Encounter for immunization: Secondary | ICD-10-CM | POA: Diagnosis not present

## 2013-11-16 ENCOUNTER — Other Ambulatory Visit: Payer: Self-pay | Admitting: Cardiology

## 2013-11-24 ENCOUNTER — Encounter: Payer: Self-pay | Admitting: Internal Medicine

## 2014-01-11 ENCOUNTER — Other Ambulatory Visit: Payer: Self-pay | Admitting: Cardiology

## 2014-01-12 ENCOUNTER — Other Ambulatory Visit: Payer: Self-pay | Admitting: Internal Medicine

## 2014-01-12 DIAGNOSIS — Z1231 Encounter for screening mammogram for malignant neoplasm of breast: Secondary | ICD-10-CM

## 2014-02-01 NOTE — Progress Notes (Signed)
Subjective:    Patient ID: Suzanne Turner, female    DOB: 1945/06/15, 69 y.o.   MRN: [id number]  HPI  09/2013 HTN Continue HCTZ 3 times a week and Toprol She is very medication averse. Advised to be sure not to change doses without checking with MD Check labs  Psoriatic drug eruption : Improving   TODAY:  Suzanne Turner is here for acute visit  She has itchy rash around her mouth that began back in November   She does recall it starting at corner of mouth when she was intubated for surgery and now has spread peri-orally around entire surface of her mouth   She has not tried anything for this.  Her opthalmologist said it was ok to stop the beta blocker .  She has not had any lisinopril in one year   Allergies  Allergen Reactions  . Medrol [Methylprednisolone] Other (See Comments)    swelling  . Lisinopril Rash  . Penicillins Rash  . Sulfonamide Derivatives Rash   Past Medical History  Diagnosis Date  . Hypertension   . Osteoarthritis   . Bronchitis   . Osteopenia   . High cholesterol     no medications  . Salivary duct stone   . Kidney stone   . Chronic eczema   . Wears glasses   . Glaucoma     Left eye  . Cataract    Past Surgical History  Procedure Laterality Date  . Tonsillectomy  1949  . Tubal ligation  1977  . Salivary stone removal Right 05/15/2012    Procedure: EXCISION OF RIGHT SUBMANDIBULAR GLAND ;  Surgeon: Izora Gala, MD;  Location: [facility] OR;  Service: ENT;  Laterality: Right;  . Orif tibia & fibula fractures Right 1966  . Salivary stone removal N/A 11/18/2012    Procedure: INTRAORAL EXCISION SUBMANDIBULAR STONE RIGHT ;  Surgeon: Izora Gala, MD;  Location: [facility];  Service: ENT;  Laterality: N/A;  . Skin biopsy Right 12/2012    legs   History   Social History  . Marital Status: Widowed    Spouse Name: N/A    Number of Children: N/A  . Years of Education: N/A   Occupational History  . Not on file.   Social History Main Topics    . Smoking status: Current Every Day Smoker -- 0.50 packs/day for 40 years    Types: Cigarettes  . Smokeless tobacco: Never Used  . Alcohol Use: No  . Drug Use: No  . Sexual Activity: Not Currently   Other Topics Concern  . Not on file   Social History Narrative   Family History  Problem Relation Age of Onset  . Arrhythmia Mother   . Colon cancer Mother 24  . Arrhythmia Brother   . Arrhythmia Maternal Uncle   . Arrhythmia Maternal Grandmother   . Breast cancer Sister   . Colon polyps Maternal Aunt    Patient Active Problem List   Diagnosis Date Noted  . Rash and nonspecific skin eruption 07/23/2013  . At risk for coronary artery disease 03/05/2013  . Salivary gland enlargement 02/14/2012  . Colon polyps 04/17/2011  . Tremor 04/17/2011  . History of renal stone 04/09/2011  . Hyperlipidemia 03/29/2011  . Family history of colon cancer 03/29/2011  . Family history of breast cancer in first degree relative 03/29/2011  . Palpitations 02/07/2011  . Chest pain 02/07/2011  . Smoking 02/07/2011  . CONSTIPATION 05/11/2008  . VITAMIN D DEFICIENCY 05/07/2008  .  HYPERTENSION 05/07/2008  . OSTEOPENIA 05/07/2008   Current Outpatient Prescriptions on File Prior to Visit  Medication Sig Dispense Refill  . acetaminophen (TYLENOL) 325 MG tablet Take 325 mg by mouth daily as needed for pain.    Marland Kitchen aspirin EC 81 MG tablet Take 81 mg by mouth daily.      . calcium carbonate (OS-CAL) 600 MG TABS Take 600 mg by mouth 2 (two) times daily with a meal.    . hydrochlorothiazide (HYDRODIURIL) 25 MG tablet Take 1/2 tablet 3 times per week 24 tablet 1  . metoprolol succinate (TOPROL-XL) 50 MG 24 hr tablet TAKE 1 TABLET BY MOUTH DAILY WITH OR IMMEDIATELY FOLLOWING A MEAL 30 tablet 0  . Multiple Vitamins-Minerals (HAIR/SKIN/NAILS/BIOTIN PO) Take 500 mg by mouth 3 (three) times daily.    . timolol (TIMOPTIC) 0.5 % ophthalmic solution Place 1 drop into the left eye 2 (two) times daily.    Marland Kitchen  triamcinolone ointment (KENALOG) 0.1 % Apply 1 application topically 2 (two) times daily.     No current facility-administered medications on file prior to visit.     Review of Systems See HPI    Objective:   Physical Exam Physical Exam  Nursing note and vitals reviewed.  Constitutional: She is oriented to person, place, and time. She appears well-developed and well-nourished.  HENT:  Head: Normocephalic and atraumatic.  Cardiovascular: Normal rate and regular rhythm. Exam reveals no gallop and no friction rub.  No murmur heard.  Pulmonary/Chest: Breath sounds normal. She has no wheezes. She has no rales.  Neurological: She is alert and oriented to person, place, and time.  Skin: Skin is warm and dry.  Red scaly perioral rash.  No other rash on face Psychiatric: She has a normal mood and affect. Her behavior is normal.              Assessment & Plan:  Peri-oral dermatitis / impetigo  Will empirically cover with doxycycline 100 bid for 10 days and HC creme 2.5% bid   Advised to make appt. With her dermatologist for follow up   She voices understanding.

## 2014-02-02 ENCOUNTER — Encounter: Payer: Self-pay | Admitting: Internal Medicine

## 2014-02-02 ENCOUNTER — Ambulatory Visit (INDEPENDENT_AMBULATORY_CARE_PROVIDER_SITE_OTHER): Payer: Medicare Other | Admitting: Internal Medicine

## 2014-02-02 VITALS — BP 151/82 | HR 76 | Resp 16 | Ht 67.0 in | Wt 135.0 lb

## 2014-02-02 DIAGNOSIS — L0100 Impetigo, unspecified: Secondary | ICD-10-CM

## 2014-02-02 DIAGNOSIS — L710 Perioral dermatitis: Secondary | ICD-10-CM

## 2014-02-02 MED ORDER — DOXYCYCLINE HYCLATE 100 MG PO TABS: 100.0000 mg | ORAL_TABLET | Freq: Two times a day (BID) | ORAL | Status: DC

## 2014-02-02 MED ORDER — HYDROCORTISONE 2.5 % EX CREA: TOPICAL_CREAM | Freq: Two times a day (BID) | CUTANEOUS | Status: DC

## 2014-02-09 DIAGNOSIS — H40012 Open angle with borderline findings, low risk, left eye: Secondary | ICD-10-CM | POA: Diagnosis not present

## 2014-02-09 DIAGNOSIS — H4011X2 Primary open-angle glaucoma, moderate stage: Secondary | ICD-10-CM | POA: Diagnosis not present

## 2014-02-09 DIAGNOSIS — H40051 Ocular hypertension, right eye: Secondary | ICD-10-CM | POA: Diagnosis not present

## 2014-02-11 ENCOUNTER — Other Ambulatory Visit: Payer: Self-pay | Admitting: Internal Medicine

## 2014-02-11 ENCOUNTER — Ambulatory Visit: Admission: RE | Admit: 2014-02-11 | Payer: Medicare Other | Source: Ambulatory Visit

## 2014-02-11 ENCOUNTER — Ambulatory Visit
Admission: RE | Admit: 2014-02-11 | Discharge: 2014-02-11 | Disposition: A | Discharge status: Home / Self Care | Payer: Medicare Other | Source: Ambulatory Visit | Attending: Internal Medicine | Admitting: Internal Medicine

## 2014-02-11 DIAGNOSIS — Z1231 Encounter for screening mammogram for malignant neoplasm of breast: Secondary | ICD-10-CM | POA: Diagnosis not present

## 2014-03-09 ENCOUNTER — Ambulatory Visit (INDEPENDENT_AMBULATORY_CARE_PROVIDER_SITE_OTHER): Payer: Medicare Other | Admitting: Cardiology

## 2014-03-09 ENCOUNTER — Encounter: Payer: Self-pay | Admitting: Cardiology

## 2014-03-09 VITALS — BP 124/74 | HR 73 | Ht 67.0 in | Wt 134.0 lb

## 2014-03-09 DIAGNOSIS — E785 Hyperlipidemia, unspecified: Secondary | ICD-10-CM

## 2014-03-09 DIAGNOSIS — Z9189 Other specified personal risk factors, not elsewhere classified: Secondary | ICD-10-CM

## 2014-03-09 DIAGNOSIS — F17200 Nicotine dependence, unspecified, uncomplicated: Secondary | ICD-10-CM

## 2014-03-09 DIAGNOSIS — Z87898 Personal history of other specified conditions: Secondary | ICD-10-CM

## 2014-03-09 DIAGNOSIS — Z720 Tobacco use: Secondary | ICD-10-CM | POA: Diagnosis not present

## 2014-03-09 NOTE — Progress Notes (Signed)
Patient ID: Suzanne Turner, female   DOB: [birth year], 69 y.o.   MRN: [id number] PCP: Dr. Coralyn Mark  68 yo with history of smoking, palpitations, and HTN returns for followup. Palpitations are minimal on Toprol XL.  She denies exertional dyspnea or chest pain.  No physical limitations.  She continues to smoke 0.5-1 pack a day.  At last appointment, I had her do a CT for calcium score.  This suggested intermediate rise (49th percentile).   ECG: NSR, normal  Labs (1/13): K 3.6, creatinine 0.71, HCT 36.3 Labs (1/14): K 4.1, creatinine 0.85 Labs (11/14): LDL 123, HDL 60, TSH normal, K 3.8, creatinine 0.86  PMH: 1. HTN 2. Hyperlipidemia 3. Osteoporosis 4. Nephrolithiasis 5. Palpitations: 3 week event monitor in 1/13 with no significant arrhythmic events.  6. Active smoker: CT chest (3/15) showed mild to moderate emphysema 7. CAD: Stress echo (12/12): Normal study. CT for calcium score (3/15) with coronary calcium score 8.5, placing the patient in the 49th percentile for age and gender, intermediate risk.  8. Sialolithiasis  SH: Smokes 1/2 ppd.  Lives in [place], retired, married.   FH: She thinks that her grandmother, brother, and mother all had atrial fibrillation. Father with MI at 67.  ROS: All systems reviewed and negative except as per HPI.   Current Outpatient Prescriptions  Medication Sig Dispense Refill  . acetaminophen (TYLENOL) 325 MG tablet Take 325 mg by mouth daily as needed for pain.    Marland Kitchen aspirin EC 81 MG tablet Take 81 mg by mouth daily.      . calcium carbonate (OS-CAL) 600 MG TABS Take 600 mg by mouth 2 (two) times daily with a meal.    . hydrochlorothiazide (HYDRODIURIL) 25 MG tablet Take 1/2 tablet 3 times per week 24 tablet 1  . hydrocortisone 2.5 % cream Apply topically 2 (two) times daily. 30 g 0  . metoprolol succinate (TOPROL-XL) 50 MG 24 hr tablet TAKE 1 TABLET BY MOUTH DAILY WITH OR IMMEDIATELY FOLLOWING A MEAL 30 tablet 0  . Multiple Vitamins-Minerals  (HAIR/SKIN/NAILS/BIOTIN PO) Take 500 mg by mouth 3 (three) times daily.    . TRAVATAN Z 0.004 % SOLN ophthalmic solution as directed.     No current facility-administered medications for this visit.    BP 124/74 mmHg  Pulse 73  Ht 5\' 7"  (1.702 m)  Wt 134 lb (60.782 kg)  BMI 20.98 kg/m2 General: NAD Neck: No JVD, no thyromegaly or thyroid nodule.  Lungs: Prolonged expiratory phase.  CV: Nondisplaced PMI.  Heart regular S1/S2, no S3/S4, no murmur.  No peripheral edema.  No carotid bruit.  Normal pedal pulses.  Abdomen: Soft, nontender, no hepatosplenomegaly, no distention.  Neurologic: Alert and oriented x 3.  Psych: Normal affect. Extremities: No clubbing or cyanosis.   Assessment/Plan: 1. Palpitations: Resolved on Toprol XL.   2. Smoking: I strongly encouraged the patient to quit. She does not want Chantix. She will think about bupropion.   3. Cardiac risk: Mrs Trupiano's father had MI at 19.  Her other risk factors for CAD are smoking and HTN.  She does not have ischemic symptoms.  She is not currently on a statin but is taking ASA 81.  Her coronary calcium score last year was intermediate risk (49th percentile).  I will have her get a Lipomed profile some time in the next week. I will have a relatively low threshold to start her on a statin.    Loralie Champagne 03/09/2014

## 2014-03-09 NOTE — Patient Instructions (Signed)
Your physician recommends that you return for a FASTING lipomed profile in the next week or so.   Your physician wants you to follow-up in: 1 year with Dr Aundra Dubin. (February 2017).  You will receive a reminder letter in the mail two months in advance. If you don't receive a letter, please call our office to schedule the follow-up appointment.

## 2014-03-13 ENCOUNTER — Other Ambulatory Visit: Payer: Self-pay | Admitting: Cardiology

## 2014-03-23 DIAGNOSIS — H16103 Unspecified superficial keratitis, bilateral: Secondary | ICD-10-CM | POA: Diagnosis not present

## 2014-03-23 DIAGNOSIS — H04123 Dry eye syndrome of bilateral lacrimal glands: Secondary | ICD-10-CM | POA: Diagnosis not present

## 2014-03-23 DIAGNOSIS — H4011X2 Primary open-angle glaucoma, moderate stage: Secondary | ICD-10-CM | POA: Diagnosis not present

## 2014-03-24 ENCOUNTER — Other Ambulatory Visit: Payer: Medicare Other

## 2014-04-01 ENCOUNTER — Other Ambulatory Visit (HOSPITAL_COMMUNITY)
Admission: RE | Admit: 2014-04-01 | Discharge: 2014-04-01 | Disposition: A | Discharge status: Home / Self Care | Payer: Medicare Other | Source: Ambulatory Visit | Attending: Internal Medicine | Admitting: Internal Medicine

## 2014-04-01 ENCOUNTER — Encounter: Payer: Self-pay | Admitting: Internal Medicine

## 2014-04-01 ENCOUNTER — Encounter: Payer: Self-pay | Admitting: *Deleted

## 2014-04-01 ENCOUNTER — Ambulatory Visit (INDEPENDENT_AMBULATORY_CARE_PROVIDER_SITE_OTHER): Payer: Medicare Other | Admitting: Internal Medicine

## 2014-04-01 VITALS — BP 151/85 | HR 86 | Resp 16 | Ht 65.5 in | Wt 132.0 lb

## 2014-04-01 DIAGNOSIS — Z1211 Encounter for screening for malignant neoplasm of colon: Secondary | ICD-10-CM | POA: Diagnosis not present

## 2014-04-01 DIAGNOSIS — Z720 Tobacco use: Secondary | ICD-10-CM | POA: Diagnosis not present

## 2014-04-01 DIAGNOSIS — Z23 Encounter for immunization: Secondary | ICD-10-CM

## 2014-04-01 DIAGNOSIS — Z124 Encounter for screening for malignant neoplasm of cervix: Secondary | ICD-10-CM | POA: Insufficient documentation

## 2014-04-01 DIAGNOSIS — Z1151 Encounter for screening for human papillomavirus (HPV): Secondary | ICD-10-CM | POA: Diagnosis not present

## 2014-04-01 DIAGNOSIS — Z0000 Encounter for general adult medical examination without abnormal findings: Secondary | ICD-10-CM

## 2014-04-01 DIAGNOSIS — F17200 Nicotine dependence, unspecified, uncomplicated: Secondary | ICD-10-CM

## 2014-04-01 DIAGNOSIS — I10 Essential (primary) hypertension: Secondary | ICD-10-CM

## 2014-04-01 LAB — LIPID PANEL
Cholesterol: 185 mg/dL (ref 0–200)
HDL: 54 mg/dL (ref >=46)
LDL Cholesterol: 98 mg/dL (ref 0–99)
Total CHOL/HDL Ratio: 3.4 Ratio
Triglycerides: 167 mg/dL — ABNORMAL HIGH (ref <150)
VLDL: 33 mg/dL (ref 0–40)

## 2014-04-01 LAB — POCT URINALYSIS DIPSTICK
Bilirubin, UA: NEGATIVE
Blood, UA: NEGATIVE
Glucose, UA: NEGATIVE
Ketones, UA: NEGATIVE
Leukocytes, UA: NEGATIVE
Nitrite, UA: NEGATIVE
Protein, UA: NEGATIVE
Spec Grav, UA: 1.01
Urobilinogen, UA: NEGATIVE
pH, UA: 6.5

## 2014-04-01 LAB — CBC WITH DIFFERENTIAL/PLATELET
Basophils Absolute: 0 10*3/uL (ref 0.0–0.1)
Basophils Relative: 0 % (ref 0–1)
Eosinophils Absolute: 0.1 10*3/uL (ref 0.0–0.7)
Eosinophils Relative: 1 % (ref 0–5)
HCT: 35.2 % — ABNORMAL LOW (ref 36.0–46.0)
Hemoglobin: 12 g/dL (ref 12.0–15.0)
Lymphocytes Relative: 14 % (ref 12–46)
Lymphs Abs: 0.8 10*3/uL (ref 0.7–4.0)
MCH: 31.4 pg (ref 26.0–34.0)
MCHC: 34.1 g/dL (ref 30.0–36.0)
MCV: 92.1 fL (ref 78.0–100.0)
MPV: 9.6 fL (ref 8.6–12.4)
Monocytes Absolute: 0.4 10*3/uL (ref 0.1–1.0)
Monocytes Relative: 8 % (ref 3–12)
Neutro Abs: 4.2 10*3/uL (ref 1.7–7.7)
Neutrophils Relative %: 77 % (ref 43–77)
Platelets: 235 10*3/uL (ref 150–400)
RBC: 3.82 MIL/uL — ABNORMAL LOW (ref 3.87–5.11)
RDW: 13.9 % (ref 11.5–15.5)
WBC: 5.4 10*3/uL (ref 4.0–10.5)

## 2014-04-01 LAB — HEMOCCULT GUIAC POC 1CARD (OFFICE): Fecal Occult Blood, POC: NEGATIVE

## 2014-04-01 LAB — COMPLETE METABOLIC PANEL WITH GFR
ALT: 21 U/L (ref 0–35)
AST: 28 U/L (ref 0–37)
Albumin: 4.2 g/dL (ref 3.5–5.2)
Alkaline Phosphatase: 54 U/L (ref 39–117)
BUN: 12 mg/dL (ref 6–23)
CO2: 21 mEq/L (ref 19–32)
Calcium: 9.6 mg/dL (ref 8.4–10.5)
Chloride: 100 mEq/L (ref 96–112)
Creat: 0.78 mg/dL (ref 0.50–1.10)
GFR, Est African American: >89 mL/min
GFR, Est Non African American: 78 mL/min
Glucose, Bld: 75 mg/dL (ref 70–99)
Potassium: 3.4 mEq/L — ABNORMAL LOW (ref 3.5–5.3)
Sodium: 136 mEq/L (ref 135–145)
Total Bilirubin: 0.6 mg/dL (ref 0.2–1.2)
Total Protein: 7.5 g/dL (ref 6.0–8.3)

## 2014-04-01 LAB — TSH: TSH: 1.672 u[IU]/mL (ref 0.350–4.500)

## 2014-04-01 NOTE — Progress Notes (Signed)
Subjective:    Patient ID: Suzanne Turner, female    DOB: 1945/03/11, 68 y.o.   MRN: [id number]  HPI  03/09/2014 cardiology note Assessment/Plan: 1. Palpitations: Resolved on Toprol XL.  2. Smoking: I strongly encouraged the patient to quit. She does not want Chantix. She will think about bupropion.  3. Cardiac risk: Mrs Swopes's father had MI at 39. Her other risk factors for CAD are smoking and HTN. She does not have ischemic symptoms. She is not currently on a statin but is taking ASA 81. Her coronary calcium score last year was intermediate risk (49th percentile). I will have her get a Lipomed profile some time in the next week. I will have a relatively low threshold to start her on a statin.   Loralie Champagne 03/09/2014  10/22/2013 my note Assessment & Plan:  HTN Continue HCTZ 3 times a week and Toprol She is very medication averse. Advised to be sure not to change doses without checking with MD Check labs  Psoriatic drug eruption : Improving  Schedule  cpE         TODAY   Suzanne Turner is here for CPE  HM  UTD with mm,  DEXA and colonoscopy.  She is due for pap today.  She has a 40 pack year smoking history   Tobacco use  See below  HTN on Toprol an dHCTZ  Hyperlipidemia  She repeatedly declines  Statin  Despite CAC score and strong FH   Osteopenia    Chronic hand tremor  Colon polyps  Neg colonoscopy7/2015    Allergies  Allergen Reactions  . Medrol [Methylprednisolone] Other (See Comments)    swelling  . Lisinopril Rash  . Penicillins Rash  . Sulfonamide Derivatives Rash   Past Medical History  Diagnosis Date  . Hypertension   . Osteoarthritis   . Bronchitis   . Osteopenia   . High cholesterol     no medications  . Salivary duct stone   . Kidney stone   . Chronic eczema   . Wears glasses   . Glaucoma     Left eye  . Cataract    Past Surgical History  Procedure Laterality Date  . Tonsillectomy  1949  . Tubal ligation  1977    . Salivary stone removal Right 05/15/2012    Procedure: EXCISION OF RIGHT SUBMANDIBULAR GLAND ;  Surgeon: Izora Gala, MD;  [facility];  Service: ENT;  Laterality: Right;  . Orif tibia & fibula fractures Right 1966  . Salivary stone removal N/A 11/18/2012    Procedure: INTRAORAL EXCISION SUBMANDIBULAR STONE RIGHT ;  Surgeon: Izora Gala, MD;  Location: [place];  Service: ENT;  Laterality: N/A;  . Skin biopsy Right 12/2012    legs   History   Social History  . Marital Status: Widowed    Spouse Name: N/A  . Number of Children: N/A  . Years of Education: N/A   Occupational History  . Not on file.   Social History Main Topics  . Smoking status: Current Every Day Smoker -- 0.50 packs/day for 40 years    Types: Cigarettes  . Smokeless tobacco: Never Used  . Alcohol Use: No  . Drug Use: No  . Sexual Activity: Not Currently   Other Topics Concern  . Not on file   Social History Narrative   Family History  Problem Relation Age of Onset  . Arrhythmia Mother   . Colon cancer Mother 23  . Arrhythmia Brother   .  Arrhythmia Maternal Uncle   . Arrhythmia Maternal Grandmother   . Breast cancer Sister   . Colon polyps Maternal Aunt    Patient Active Problem List   Diagnosis Date Noted  . Rash and nonspecific skin eruption 07/23/2013  . At risk for coronary artery disease 03/05/2013  . Salivary gland enlargement 02/14/2012  . Colon polyps 04/17/2011  . Tremor 04/17/2011  . History of renal stone 04/09/2011  . Hyperlipidemia 03/29/2011  . Family history of colon cancer 03/29/2011  . Family history of breast cancer in first degree relative 03/29/2011  . Palpitations 02/07/2011  . Chest pain 02/07/2011  . Smoking 02/07/2011  . CONSTIPATION 05/11/2008  . VITAMIN D DEFICIENCY 05/07/2008  . HYPERTENSION 05/07/2008  . OSTEOPENIA 05/07/2008   Current Outpatient Prescriptions on File Prior to Visit  Medication Sig Dispense Refill  . acetaminophen (TYLENOL)  325 MG tablet Take 325 mg by mouth daily as needed for pain.    Marland Kitchen aspirin EC 81 MG tablet Take 81 mg by mouth daily.      . calcium carbonate (OS-CAL) 600 MG TABS Take 600 mg by mouth 2 (two) times daily with a meal.    . hydrochlorothiazide (HYDRODIURIL) 25 MG tablet Take 1/2 tablet 3 times per week 24 tablet 1  . hydrocortisone 2.5 % cream Apply topically 2 (two) times daily. 30 g 0  . metoprolol succinate (TOPROL-XL) 50 MG 24 hr tablet TAKE 1 TABLET BY MOUTH DAILY WITH OR IMMEDIATELY FOLLOWING A MEAL 30 tablet 11  . Multiple Vitamins-Minerals (HAIR/SKIN/NAILS/BIOTIN PO) Take 500 mg by mouth 3 (three) times daily.    . TRAVATAN Z 0.004 % SOLN ophthalmic solution as directed.     No current facility-administered medications on file prior to visit.      Review of Systems  Respiratory: Negative for cough, chest tightness, shortness of breath and wheezing.   Cardiovascular: Negative for chest pain, palpitations and leg swelling.  All other systems reviewed and are negative.      Objective:   Physical Exam Physical Exam  Vital signs and nursing note reviewed  Constitutional: She is oriented to person, place, and time. She appears well-developed and well-nourished. She is cooperative.  HENT:  Head: Normocephalic and atraumatic.  Right Ear: Tympanic membrane normal.  Left Ear: Tympanic membrane normal.  Nose: Nose normal.  Mouth/Throat: Oropharynx is clear and moist and mucous membranes are normal. No oropharyngeal exudate or posterior oropharyngeal erythema.  Eyes: Conjunctivae and EOM are normal. Pupils are equal, round, and reactive to light.  Neck: Neck supple. No JVD present. Carotid bruit is not present. No mass and no thyromegaly present.  Cardiovascular: Regular rhythm, normal heart sounds, intact distal pulses and normal pulses.  Exam reveals no gallop and no friction rub.   No murmur heard. Pulses:      Dorsalis pedis pulses are 2+ on the right side, and 2+ on the left  side.  Pulmonary/Chest: Breath sounds normal. She has no wheezes. She has no rhonchi. She has no rales. Right breast exhibits no mass, no nipple discharge and no skin change. Left breast exhibits no mass, no nipple discharge and no skin change.  Abdominal: Soft. Bowel sounds are normal. She exhibits no distension and no mass. There is no hepatosplenomegaly. There is no tenderness. There is no CVA tenderness.  Genitourinary: Rectum normal, vagina normal and uterus normal. Rectal exam shows no mass. Guaiac negative stool. No labial fusion. There is no lesion on the right labia. There is no lesion  on the left labia. Cervix exhibits no motion tenderness. Right adnexum displays no mass, no tenderness and no fullness. Left adnexum displays no mass, no tenderness and no fullness. No erythema around the vagina.  Musculoskeletal:       No active synovitis to any joint.    Lymphadenopathy:       Right cervical: No superficial cervical adenopathy present.      Left cervical: No superficial cervical adenopathy present.       Right axillary: No pectoral and no lateral adenopathy present.       Left axillary: No pectoral and no lateral adenopathy present.      Right: No inguinal adenopathy present.       Left: No inguinal adenopathy present.  Neurological: She is alert and oriented to person, place, and time. She has normal strength and normal reflexes. No cranial nerve deficit or sensory deficit. She displays a negative Romberg sign. Coordination and gait normal.  Intention tremor both hands Skin: Skin is warm and dry. No abrasion, no bruising, no ecchymosis and no rash noted. No cyanosis. Nails show no clubbing.   Psoriatic type plaques both lower legs Psychiatric: She has a normal mood and affect. Her speech is normal and behavior is normal.          Assessment & Plan:   HM :  Pap today  Will schedule lung CA  screening CT  (shared decision done)    UTD mm, colonoscopy and DEXA  Tobacco use :  Shared  decision process extensively counseled regarding smoking cessation.  She is under stress trying to sell her home.  May be willing to try Zyban later in spring.  HTN continue beta blockcer and HCTZ  HYperlipidemia she is at moderate risk with CAC ,  Strong FH MI she repeatedly declines statin  Osteopenia  Psoriasis vs drug eruption managed by derm   H/O renal stone  Chronic hand tremor  H/O colon polyps   Low vitamin D  Will cehck today        Assessment & Plan:

## 2014-04-02 LAB — VITAMIN D 25 HYDROXY (VIT D DEFICIENCY, FRACTURES): Vit D, 25-Hydroxy: 47 ng/mL (ref 30–100)

## 2014-04-05 ENCOUNTER — Telehealth: Payer: Self-pay | Admitting: Internal Medicine

## 2014-04-05 MED ORDER — POTASSIUM CHLORIDE CRYS ER 20 MEQ PO TBCR: EXTENDED_RELEASE_TABLET | ORAL | Status: DC

## 2014-04-05 NOTE — Telephone Encounter (Signed)
Suzanne Turner  Call pt and let her know that her K is slightly low due to her HCTZ.  I sent a RX to [facility] and advise her to take a K pill every time she takes her HCTZ  Ok to mail labs to her

## 2014-04-06 NOTE — Telephone Encounter (Signed)
Suzanne Turner is aware of her lab results and a copy of her labs were mailed to her - eh

## 2014-04-08 LAB — CYTOLOGY - PAP

## 2014-04-17 ENCOUNTER — Ambulatory Visit (INDEPENDENT_AMBULATORY_CARE_PROVIDER_SITE_OTHER): Payer: Medicare Other | Admitting: Family Medicine

## 2014-04-17 VITALS — BP 102/60 | HR 94 | Temp 98.0°F | Resp 16 | Ht 67.25 in | Wt 131.6 lb

## 2014-04-17 DIAGNOSIS — J329 Chronic sinusitis, unspecified: Secondary | ICD-10-CM

## 2014-04-17 DIAGNOSIS — B349 Viral infection, unspecified: Secondary | ICD-10-CM | POA: Diagnosis not present

## 2014-04-17 DIAGNOSIS — R6889 Other general symptoms and signs: Secondary | ICD-10-CM | POA: Diagnosis not present

## 2014-04-17 DIAGNOSIS — Z720 Tobacco use: Secondary | ICD-10-CM

## 2014-04-17 DIAGNOSIS — F17200 Nicotine dependence, unspecified, uncomplicated: Secondary | ICD-10-CM

## 2014-04-17 DIAGNOSIS — J310 Chronic rhinitis: Secondary | ICD-10-CM

## 2014-04-17 MED ORDER — FLUTICASONE PROPIONATE 50 MCG/ACT NA SUSP: 2.0000 | Freq: Every day | NASAL | Status: DC

## 2014-04-17 MED ORDER — DOXYCYCLINE HYCLATE 100 MG PO CAPS: 100.0000 mg | ORAL_CAPSULE | Freq: Two times a day (BID) | ORAL | Status: DC

## 2014-04-17 NOTE — Progress Notes (Signed)
Subjective: Pleasant female in no major distress except for head is congested. She had cold symptoms for about a week. The last 2 days she got feeling more flulike with body aches, headache, nasal congestion and cough. Actually it was worse 2 days ago with chills. She did not ever documented a fever. It got feeling a little better the last 2 days but she was afraid she was had something bad in needed something major. She has a history of recurrent sinusitis is in the past.  She wants to go on Wellbutrin some time is going to discuss that with her primary care doctor. We TALKED ABOUT CIGARETTES AND THE NEED TO MAKE UP HER MIND TO QUIT, NOT TO TRY AND ON MEDICINES. MEDICINES ARE OKAY, BUT FIRST SHE NEEDS TO MAKE HER MIND THAT SHE IS GOING TO DO IT. SHE HAS NOT BEEN SMOKING FOR THE LAST FEW DAYS, AND I ENCOURAGED HER JUST TO STAY AWAY FROM THEM. FROM THE START. (I'm not sure why that flipped into capitals,  was not for emphasis)   Objective: Pleasant lady in no acute distress. TMs normal. Nose is inflamed appearing with a little blood in the mucus in the right nose. Throat was clear. Neck supple without nodes or thyromegaly. Chest clear to auscultation. Heart regular without murmurs. She is a little tender below the right hip with nodes really aren't enlarged.  Assessment: Viral syndrome with flulike symptoms Rhinosinusitis, probably viral Tobacco use disorder  Plan: Flonase Fluids Rest Time Doxycycline if not improving in the next couple of days but did not want to start an antibiotic since she has gradually been doing better the last couple of days.

## 2014-04-17 NOTE — Patient Instructions (Signed)
Use fluticasone nose spray 2 sprays each nostril once daily  If not significantly improved in the next 2-3 days go ahead and get the prescription for doxycycline filled and take one twice daily after breakfast and supper. Do not take with dairy products as you are not absorbing the medicine well.  Drink plenty of fluids and get enough rest  Take Tylenol ibuprofen for the headaches  Be serious about stopping smoking. Advised to pick a quit date and do it. Better yet, has not smoked for couple of days and you should just stay off of them. Accept the fact that you will feel miserable for a little while.  Return if worse at anytime

## 2014-04-20 ENCOUNTER — Ambulatory Visit: Payer: BLUE CROSS/BLUE SHIELD

## 2014-05-11 ENCOUNTER — Ambulatory Visit (INDEPENDENT_AMBULATORY_CARE_PROVIDER_SITE_OTHER): Payer: Medicare Other | Admitting: *Deleted

## 2014-05-11 DIAGNOSIS — Z23 Encounter for immunization: Secondary | ICD-10-CM

## 2014-05-18 ENCOUNTER — Telehealth: Payer: Self-pay | Admitting: Internal Medicine

## 2014-05-18 NOTE — Telephone Encounter (Signed)
I spoke with Suzanne Turner in regards to the chest CT screening. She is not ready to do it yet. She has too much going on at this time.

## 2014-05-18 NOTE — Telephone Encounter (Signed)
Suzanne Turner  Call pt and ask if she is ready to have her screening chest CT done.  She wanted this done in late April.  Ask if she would like to get this done before I leave.  Order is in chart -- if she wants this done just schedule date for her  Route back to me with her response  thanks

## 2014-07-20 DIAGNOSIS — H16103 Unspecified superficial keratitis, bilateral: Secondary | ICD-10-CM | POA: Diagnosis not present

## 2014-07-20 DIAGNOSIS — H4011X2 Primary open-angle glaucoma, moderate stage: Secondary | ICD-10-CM | POA: Diagnosis not present

## 2014-07-20 DIAGNOSIS — H04123 Dry eye syndrome of bilateral lacrimal glands: Secondary | ICD-10-CM | POA: Diagnosis not present

## 2014-07-23 ENCOUNTER — Ambulatory Visit: Payer: BLUE CROSS/BLUE SHIELD | Admitting: Family Medicine

## 2014-07-24 ENCOUNTER — Encounter: Payer: Self-pay | Admitting: *Deleted

## 2014-07-24 ENCOUNTER — Telehealth: Payer: Self-pay | Admitting: *Deleted

## 2014-07-24 NOTE — Telephone Encounter (Signed)
Unable to reach patient at time of Pre-Visit Call.  Left message for patient to return call when available.    

## 2014-07-24 NOTE — Addendum Note (Signed)
Addended by: Leticia Penna A on: 07/24/2014 03:57 PM   Modules accepted: Orders, Medications

## 2014-07-24 NOTE — Telephone Encounter (Signed)
Pre-Visit Call completed with patient and chart updated.   Pre-Visit Info documented in Specialty Comments under [employer].    

## 2014-07-28 ENCOUNTER — Ambulatory Visit (INDEPENDENT_AMBULATORY_CARE_PROVIDER_SITE_OTHER): Payer: Medicare Other | Admitting: Family Medicine

## 2014-07-28 ENCOUNTER — Other Ambulatory Visit: Payer: Self-pay | Admitting: Family Medicine

## 2014-07-28 ENCOUNTER — Encounter: Payer: Self-pay | Admitting: Family Medicine

## 2014-07-28 VITALS — BP 144/90 | HR 86 | Temp 98.4°F | Ht 67.0 in | Wt 127.2 lb

## 2014-07-28 DIAGNOSIS — H4010X0 Unspecified open-angle glaucoma, stage unspecified: Secondary | ICD-10-CM

## 2014-07-28 DIAGNOSIS — E875 Hyperkalemia: Secondary | ICD-10-CM

## 2014-07-28 DIAGNOSIS — I10 Essential (primary) hypertension: Secondary | ICD-10-CM | POA: Diagnosis not present

## 2014-07-28 DIAGNOSIS — E559 Vitamin D deficiency, unspecified: Secondary | ICD-10-CM

## 2014-07-28 DIAGNOSIS — Z8619 Personal history of other infectious and parasitic diseases: Secondary | ICD-10-CM | POA: Insufficient documentation

## 2014-07-28 DIAGNOSIS — R21 Rash and other nonspecific skin eruption: Secondary | ICD-10-CM

## 2014-07-28 DIAGNOSIS — E785 Hyperlipidemia, unspecified: Secondary | ICD-10-CM

## 2014-07-28 DIAGNOSIS — D508 Other iron deficiency anemias: Secondary | ICD-10-CM | POA: Diagnosis not present

## 2014-07-28 DIAGNOSIS — H409 Unspecified glaucoma: Secondary | ICD-10-CM

## 2014-07-28 DIAGNOSIS — E876 Hypokalemia: Secondary | ICD-10-CM | POA: Diagnosis not present

## 2014-07-28 DIAGNOSIS — R002 Palpitations: Secondary | ICD-10-CM | POA: Diagnosis not present

## 2014-07-28 DIAGNOSIS — F17200 Nicotine dependence, unspecified, uncomplicated: Secondary | ICD-10-CM

## 2014-07-28 DIAGNOSIS — Z720 Tobacco use: Secondary | ICD-10-CM

## 2014-07-28 HISTORY — DX: Unspecified glaucoma: H40.9

## 2014-07-28 HISTORY — DX: Unspecified open-angle glaucoma, stage unspecified: H40.10X0

## 2014-07-28 LAB — CBC
HCT: 37.7 % (ref 36.0–46.0)
Hemoglobin: 12.8 g/dL (ref 12.0–15.0)
MCHC: 33.8 g/dL (ref 30.0–36.0)
MCV: 94 fl (ref 78.0–100.0)
Platelets: 250 10*3/uL (ref 150.0–400.0)
RBC: 4.02 Mil/uL (ref 3.87–5.11)
RDW: 13.8 % (ref 11.5–15.5)
WBC: 6.4 10*3/uL (ref 4.0–10.5)

## 2014-07-28 LAB — COMPREHENSIVE METABOLIC PANEL
ALT: 21 U/L (ref 0–35)
AST: 28 U/L (ref 0–37)
Albumin: 4.3 g/dL (ref 3.5–5.2)
Alkaline Phosphatase: 53 U/L (ref 39–117)
BUN: 12 mg/dL (ref 6–23)
CO2: 30 mEq/L (ref 19–32)
Calcium: 10.8 mg/dL — ABNORMAL HIGH (ref 8.4–10.5)
Chloride: 101 mEq/L (ref 96–112)
Creatinine, Ser: 0.81 mg/dL (ref 0.40–1.20)
GFR: 74.46 mL/min (ref >60.00)
Glucose, Bld: 84 mg/dL (ref 70–99)
Potassium: 3.4 mEq/L — ABNORMAL LOW (ref 3.5–5.1)
Sodium: 138 mEq/L (ref 135–145)
Total Bilirubin: 0.6 mg/dL (ref 0.2–1.2)
Total Protein: 8.2 g/dL (ref 6.0–8.3)

## 2014-07-28 NOTE — Assessment & Plan Note (Signed)
Encouraged heart healthy diet, increase exercise, avoid trans fats, consider a krill oil cap daily 

## 2014-07-28 NOTE — Assessment & Plan Note (Signed)
Responds to Calendula cream, did not respond to steroid creams

## 2014-07-28 NOTE — Assessment & Plan Note (Signed)
Well controlled, no changes to meds. Encouraged heart healthy diet such as the DASH diet and exercise as tolerated.  °

## 2014-07-28 NOTE — Progress Notes (Signed)
Suzanne Turner  [id number] 11-10-1945 07/28/2014      Progress Note-Follow Up  Subjective  Chief Complaint  Chief Complaint  Patient presents with  . Establish Care    HPI  Patient is a 69 y.o. female in today for routine medical care. Patient is in today to establish care. Her previous PMD has left practice. She reports overall feeling well although she acknowledges being very stressed today secondary to it being moving day. She reports last week her blood pressure was 416 to 606 systolic over 30Z to 60F diastolic. No recent illness or acute complaints are noted. She does note a long-standing history of tremor in her hands right worse than left this has not worsened recently. Denies CP/palp/SOB/HA/congestion/fevers/GI or GU c/o. Taking meds as prescribed Past Medical History  Diagnosis Date  . Hypertension   . Osteoarthritis   . Bronchitis   . Osteopenia   . High cholesterol     no medications  . Salivary duct stone   . Kidney stone   . Chronic eczema   . Wears glasses   . Glaucoma     Left eye  . Cataract     Past Surgical History  Procedure Laterality Date  . Tonsillectomy  1949  . Tubal ligation  1977  . Salivary stone removal Right 05/15/2012    Procedure: EXCISION OF RIGHT SUBMANDIBULAR GLAND ;  Surgeon: Izora Gala, MD;  Location: [facility] OR;  Service: ENT;  Laterality: Right;  . Orif tibia & fibula fractures Right 1966  . Salivary stone removal N/A 11/18/2012    Procedure: INTRAORAL EXCISION SUBMANDIBULAR STONE RIGHT ;  Surgeon: Izora Gala, MD;  Location: [place];  Service: ENT;  Laterality: N/A;  . Skin biopsy Right 12/2012    legs    Family History  Problem Relation Age of Onset  . Arrhythmia Mother   . Colon cancer Mother 27  . Arrhythmia Brother   . Heart disease Brother   . Arrhythmia Maternal Uncle   . Arrhythmia Maternal Grandmother   . Breast cancer Sister   . Colon polyps Maternal Aunt     History   Social History  .  Marital Status: Widowed    Spouse Name: N/A  . Number of Children: N/A  . Years of Education: N/A   Occupational History  . Not on file.   Social History Main Topics  . Smoking status: Current Every Day Smoker -- 0.50 packs/day for 40 years    Types: Cigarettes  . Smokeless tobacco: Never Used  . Alcohol Use: No  . Drug Use: No  . Sexual Activity: Not Currently   Other Topics Concern  . Not on file   Social History Narrative    Current Outpatient Prescriptions on File Prior to Visit  Medication Sig Dispense Refill  . acetaminophen (TYLENOL) 325 MG tablet Take 325 mg by mouth daily as needed for pain.    Marland Kitchen aspirin EC 81 MG tablet Take 81 mg by mouth daily.      . calcium carbonate (OS-CAL) 600 MG TABS Take 600 mg by mouth 3 (three) times daily with meals.     . hydrochlorothiazide (HYDRODIURIL) 25 MG tablet Take 1/2 tablet 3 times per week 24 tablet 1  . Multiple Vitamins-Minerals (HAIR/SKIN/NAILS/BIOTIN PO) Take 500 mg by mouth 3 (three) times daily.    . potassium chloride SA (K-DUR,KLOR-CON) 20 MEQ tablet Take one tablet with your diuretic pill 30 tablet 1  . TRAVATAN Z 0.004 %  SOLN ophthalmic solution at bedtime.      No current facility-administered medications on file prior to visit.    Allergies  Allergen Reactions  . Medrol [Methylprednisolone] Other (See Comments)    swelling  . Lisinopril Rash  . Penicillins Rash  . Sulfonamide Derivatives Rash    Review of Systems  Review of Systems  Constitutional: Negative for fever and malaise/fatigue.  HENT: Negative for congestion.   Eyes: Negative for discharge.  Respiratory: Negative for shortness of breath.   Cardiovascular: Negative for chest pain, palpitations and leg swelling.  Gastrointestinal: Negative for nausea, abdominal pain and diarrhea.  Genitourinary: Negative for dysuria.  Musculoskeletal: Negative for falls.  Skin: Negative for rash.  Neurological: Negative for loss of consciousness and  headaches.  Endo/Heme/Allergies: Negative for polydipsia.  Psychiatric/Behavioral: Negative for depression and suicidal ideas. The patient is not nervous/anxious and does not have insomnia.     Objective  BP 144/90 mmHg  Pulse 86  Temp(Src) 98.4 F (36.9 C) (Oral)  Ht 5\' 7"  (1.702 m)  Wt 127 lb 4 oz (57.72 kg)  BMI 19.93 kg/m2  SpO2 95%  Physical Exam  Physical Exam  Constitutional: She is oriented to person, place, and time and well-developed, well-nourished, and in no distress. No distress.  HENT:  Head: Normocephalic and atraumatic.  Eyes: Conjunctivae are normal.  Neck: Neck supple. No thyromegaly present.  Cardiovascular: Normal rate, regular rhythm and normal heart sounds.   No murmur heard. Pulmonary/Chest: Effort normal and breath sounds normal. She has no wheezes.  Abdominal: She exhibits no distension and no mass.  Musculoskeletal: She exhibits no edema.  Lymphadenopathy:    She has no cervical adenopathy.  Neurological: She is alert and oriented to person, place, and time.  Skin: Skin is warm and dry. No rash noted. She is not diaphoretic.  Psychiatric: Memory, affect and judgment normal.    Lab Results  Component Value Date   TSH 1.672 04/01/2014   Lab Results  Component Value Date   WBC 5.4 04/01/2014   HGB 12.0 04/01/2014   HCT 35.2* 04/01/2014   MCV 92.1 04/01/2014   PLT 235 04/01/2014   Lab Results  Component Value Date   CREATININE 0.78 04/01/2014   BUN 12 04/01/2014   NA 136 04/01/2014   K 3.4* 04/01/2014   CL 100 04/01/2014   CO2 21 04/01/2014   Lab Results  Component Value Date   ALT 21 04/01/2014   AST 28 04/01/2014   ALKPHOS 54 04/01/2014   BILITOT 0.6 04/01/2014   Lab Results  Component Value Date   CHOL 185 04/01/2014   Lab Results  Component Value Date   HDL 54 04/01/2014   Lab Results  Component Value Date   LDLCALC 98 04/01/2014   Lab Results  Component Value Date   TRIG 167* 04/01/2014   Lab Results    Component Value Date   CHOLHDL 3.4 04/01/2014     Assessment & Plan  Essential hypertension Well controlled, no changes to meds. Encouraged heart healthy diet such as the DASH diet and exercise as tolerated.   Hyperlipidemia Encouraged heart healthy diet, increase exercise, avoid trans fats, consider a krill oil cap daily  Palpitations No recent episodes  Smoking Encouraged complete cessation. Discussed need to quit as relates to risk of numerous cancers, cardiac and pulmonary disease as well as neurologic complications. Counseled for greater than 3 minutes. Smokes about 1/2 ppd.  Rash and nonspecific skin eruption Responds to Calendula cream, did not  respond to steroid creams  Vitamin D deficiency Encouraged OTC supplements and will monitor  Hypokalemia Encouraged to increase potassium intake in diet and watch for symptoms of decreased levels. stable  Other iron deficiency anemias Resolved on recheck  Hypercalcemia Decrease oscal add vitamin D 2000 Iu daily and recheck in 1 month with repeat vitamin d level and PTH level

## 2014-07-28 NOTE — Patient Instructions (Signed)

## 2014-07-28 NOTE — Assessment & Plan Note (Signed)
Encouraged complete cessation. Discussed need to quit as relates to risk of numerous cancers, cardiac and pulmonary disease as well as neurologic complications. Counseled for greater than 3 minutes. Smokes about 1/2 ppd.

## 2014-07-28 NOTE — Assessment & Plan Note (Signed)
No recent episodes

## 2014-07-28 NOTE — Progress Notes (Signed)
Pre visit review using our clinic review tool, if applicable. No additional management support is needed unless otherwise documented below in the visit note. 

## 2014-08-02 ENCOUNTER — Encounter: Payer: Self-pay | Admitting: Family Medicine

## 2014-08-02 DIAGNOSIS — E876 Hypokalemia: Secondary | ICD-10-CM | POA: Insufficient documentation

## 2014-08-02 DIAGNOSIS — D508 Other iron deficiency anemias: Secondary | ICD-10-CM | POA: Insufficient documentation

## 2014-08-02 HISTORY — DX: Hypercalcemia: E83.52

## 2014-08-02 NOTE — Assessment & Plan Note (Signed)
Encouraged OTC supplements and will monitor

## 2014-08-02 NOTE — Assessment & Plan Note (Signed)
Encouraged to increase potassium intake in diet and watch for symptoms of decreased levels. stable

## 2014-08-02 NOTE — Assessment & Plan Note (Signed)
Resolved on recheck 

## 2014-08-02 NOTE — Assessment & Plan Note (Signed)
Decrease oscal add vitamin D 2000 Iu daily and recheck in 1 month with repeat vitamin d level and PTH level

## 2014-08-03 ENCOUNTER — Other Ambulatory Visit: Payer: Self-pay | Admitting: Family Medicine

## 2014-08-03 DIAGNOSIS — E559 Vitamin D deficiency, unspecified: Secondary | ICD-10-CM

## 2014-08-03 NOTE — Telephone Encounter (Signed)
Labs entered.

## 2014-08-28 ENCOUNTER — Other Ambulatory Visit: Payer: Self-pay | Admitting: Family Medicine

## 2014-08-28 ENCOUNTER — Other Ambulatory Visit (INDEPENDENT_AMBULATORY_CARE_PROVIDER_SITE_OTHER): Payer: Medicare Other

## 2014-08-28 DIAGNOSIS — E876 Hypokalemia: Secondary | ICD-10-CM

## 2014-08-28 DIAGNOSIS — E559 Vitamin D deficiency, unspecified: Secondary | ICD-10-CM

## 2014-08-28 DIAGNOSIS — E875 Hyperkalemia: Secondary | ICD-10-CM

## 2014-08-28 LAB — COMPREHENSIVE METABOLIC PANEL
ALT: 25 U/L (ref 0–35)
AST: 33 U/L (ref 0–37)
Albumin: 4.3 g/dL (ref 3.5–5.2)
Alkaline Phosphatase: 46 U/L (ref 39–117)
BUN: 10 mg/dL (ref 6–23)
CO2: 29 mEq/L (ref 19–32)
Calcium: 10.2 mg/dL (ref 8.4–10.5)
Chloride: 101 mEq/L (ref 96–112)
Creatinine, Ser: 0.76 mg/dL (ref 0.40–1.20)
GFR: 80.12 mL/min (ref >60.00)
Glucose, Bld: 80 mg/dL (ref 70–99)
Potassium: 3 mEq/L — ABNORMAL LOW (ref 3.5–5.1)
Sodium: 136 mEq/L (ref 135–145)
Total Bilirubin: 0.5 mg/dL (ref 0.2–1.2)
Total Protein: 7.7 g/dL (ref 6.0–8.3)

## 2014-08-28 LAB — VITAMIN D 25 HYDROXY (VIT D DEFICIENCY, FRACTURES): VITD: 53.22 ng/mL (ref 30.00–100.00)

## 2014-08-31 LAB — PTH, INTACT AND CALCIUM
Calcium: 9.9 mg/dL (ref 8.4–10.5)
PTH: 19 pg/mL (ref 14–64)

## 2014-09-02 ENCOUNTER — Other Ambulatory Visit: Payer: BLUE CROSS/BLUE SHIELD

## 2014-09-03 ENCOUNTER — Other Ambulatory Visit (INDEPENDENT_AMBULATORY_CARE_PROVIDER_SITE_OTHER): Payer: Medicare Other

## 2014-09-03 DIAGNOSIS — E876 Hypokalemia: Secondary | ICD-10-CM | POA: Diagnosis not present

## 2014-09-03 LAB — COMPREHENSIVE METABOLIC PANEL
ALT: 22 U/L (ref 0–35)
AST: 28 U/L (ref 0–37)
Albumin: 4.2 g/dL (ref 3.5–5.2)
Alkaline Phosphatase: 48 U/L (ref 39–117)
BUN: 10 mg/dL (ref 6–23)
CO2: 31 mEq/L (ref 19–32)
Calcium: 10 mg/dL (ref 8.4–10.5)
Chloride: 97 mEq/L (ref 96–112)
Creatinine, Ser: 0.77 mg/dL (ref 0.40–1.20)
GFR: 78.92 mL/min (ref >60.00)
Glucose, Bld: 139 mg/dL — ABNORMAL HIGH (ref 70–99)
Potassium: 3.7 mEq/L (ref 3.5–5.1)
Sodium: 133 mEq/L — ABNORMAL LOW (ref 135–145)
Total Bilirubin: 0.5 mg/dL (ref 0.2–1.2)
Total Protein: 7.6 g/dL (ref 6.0–8.3)

## 2014-09-10 ENCOUNTER — Other Ambulatory Visit: Payer: Self-pay | Admitting: Family Medicine

## 2014-09-10 MED ORDER — POTASSIUM CHLORIDE CRYS ER 20 MEQ PO TBCR: EXTENDED_RELEASE_TABLET | ORAL | Status: DC

## 2014-11-16 DIAGNOSIS — Z23 Encounter for immunization: Secondary | ICD-10-CM | POA: Diagnosis not present

## 2014-12-10 ENCOUNTER — Other Ambulatory Visit: Payer: Self-pay | Admitting: Family Medicine

## 2014-12-10 MED ORDER — HYDROCHLOROTHIAZIDE 25 MG PO TABS: ORAL_TABLET | ORAL | Status: DC

## 2014-12-22 ENCOUNTER — Telehealth: Payer: Self-pay | Admitting: Family Medicine

## 2014-12-22 NOTE — Telephone Encounter (Signed)
LM for pt to call and sch flu shot or notify us if already received.

## 2014-12-23 ENCOUNTER — Telehealth: Payer: Self-pay | Admitting: Family Medicine

## 2014-12-23 NOTE — Telephone Encounter (Signed)
Flu shot record.//AB/CMA

## 2014-12-23 NOTE — Telephone Encounter (Signed)
Patient had Flu Shot at [facility] the last week of October 2016

## 2014-12-28 DIAGNOSIS — H401122 Primary open-angle glaucoma, left eye, moderate stage: Secondary | ICD-10-CM | POA: Diagnosis not present

## 2014-12-28 DIAGNOSIS — H401112 Primary open-angle glaucoma, right eye, moderate stage: Secondary | ICD-10-CM | POA: Diagnosis not present

## 2015-01-04 DIAGNOSIS — H04123 Dry eye syndrome of bilateral lacrimal glands: Secondary | ICD-10-CM | POA: Diagnosis not present

## 2015-01-04 DIAGNOSIS — Z0100 Encounter for examination of eyes and vision without abnormal findings: Secondary | ICD-10-CM | POA: Diagnosis not present

## 2015-01-04 DIAGNOSIS — H532 Diplopia: Secondary | ICD-10-CM | POA: Diagnosis not present

## 2015-01-04 DIAGNOSIS — H401122 Primary open-angle glaucoma, left eye, moderate stage: Secondary | ICD-10-CM | POA: Diagnosis not present

## 2015-01-29 ENCOUNTER — Other Ambulatory Visit: Payer: Self-pay

## 2015-01-29 DIAGNOSIS — Z1231 Encounter for screening mammogram for malignant neoplasm of breast: Secondary | ICD-10-CM

## 2015-02-18 ENCOUNTER — Ambulatory Visit
Admission: RE | Admit: 2015-02-18 | Discharge: 2015-02-18 | Disposition: A | Discharge status: Home / Self Care | Payer: Medicare Other | Source: Ambulatory Visit

## 2015-02-18 DIAGNOSIS — Z1231 Encounter for screening mammogram for malignant neoplasm of breast: Secondary | ICD-10-CM | POA: Diagnosis not present

## 2015-02-25 ENCOUNTER — Encounter: Payer: Self-pay | Admitting: Family Medicine

## 2015-04-01 ENCOUNTER — Ambulatory Visit: Payer: BLUE CROSS/BLUE SHIELD | Admitting: Family Medicine

## 2015-04-05 DIAGNOSIS — H532 Diplopia: Secondary | ICD-10-CM | POA: Diagnosis not present

## 2015-04-05 DIAGNOSIS — H5210 Myopia, unspecified eye: Secondary | ICD-10-CM | POA: Diagnosis not present

## 2015-04-05 DIAGNOSIS — H5000 Unspecified esotropia: Secondary | ICD-10-CM | POA: Diagnosis not present

## 2015-04-05 DIAGNOSIS — H25813 Combined forms of age-related cataract, bilateral: Secondary | ICD-10-CM | POA: Diagnosis not present

## 2015-04-05 DIAGNOSIS — H401134 Primary open-angle glaucoma, bilateral, indeterminate stage: Secondary | ICD-10-CM | POA: Diagnosis not present

## 2015-04-12 DIAGNOSIS — H401122 Primary open-angle glaucoma, left eye, moderate stage: Secondary | ICD-10-CM | POA: Diagnosis not present

## 2015-04-12 DIAGNOSIS — H40011 Open angle with borderline findings, low risk, right eye: Secondary | ICD-10-CM | POA: Diagnosis not present

## 2015-04-12 DIAGNOSIS — H25813 Combined forms of age-related cataract, bilateral: Secondary | ICD-10-CM | POA: Diagnosis not present

## 2015-04-12 DIAGNOSIS — M3500 Sicca syndrome, unspecified: Secondary | ICD-10-CM | POA: Diagnosis not present

## 2015-04-28 DIAGNOSIS — H532 Diplopia: Secondary | ICD-10-CM | POA: Diagnosis not present

## 2015-04-28 DIAGNOSIS — H401122 Primary open-angle glaucoma, left eye, moderate stage: Secondary | ICD-10-CM | POA: Diagnosis not present

## 2015-04-28 DIAGNOSIS — H40012 Open angle with borderline findings, low risk, left eye: Secondary | ICD-10-CM | POA: Diagnosis not present

## 2015-05-26 DIAGNOSIS — H16103 Unspecified superficial keratitis, bilateral: Secondary | ICD-10-CM | POA: Diagnosis not present

## 2015-05-26 DIAGNOSIS — H1859 Other hereditary corneal dystrophies: Secondary | ICD-10-CM | POA: Diagnosis not present

## 2015-05-26 DIAGNOSIS — H04123 Dry eye syndrome of bilateral lacrimal glands: Secondary | ICD-10-CM | POA: Diagnosis not present

## 2015-05-26 DIAGNOSIS — H401122 Primary open-angle glaucoma, left eye, moderate stage: Secondary | ICD-10-CM | POA: Diagnosis not present

## 2015-05-28 ENCOUNTER — Other Ambulatory Visit: Payer: Self-pay | Admitting: *Deleted

## 2015-05-28 MED ORDER — METOPROLOL SUCCINATE ER 25 MG PO TB24: 25.0000 mg | ORAL_TABLET | Freq: Every day | ORAL | Status: DC

## 2015-06-01 ENCOUNTER — Encounter: Payer: Self-pay | Admitting: Internal Medicine

## 2015-06-08 ENCOUNTER — Other Ambulatory Visit: Payer: Self-pay | Admitting: Family Medicine

## 2015-06-18 ENCOUNTER — Other Ambulatory Visit: Payer: Self-pay | Admitting: Family Medicine

## 2015-06-18 ENCOUNTER — Telehealth: Payer: Self-pay | Admitting: Family Medicine

## 2015-06-18 MED ORDER — POTASSIUM CHLORIDE CRYS ER 20 MEQ PO TBCR: EXTENDED_RELEASE_TABLET | ORAL | Status: DC

## 2015-06-18 NOTE — Telephone Encounter (Signed)
Have her continue the potassium same sig, same strenth, 3 month supply til seen

## 2015-06-18 NOTE — Telephone Encounter (Signed)
Pt called in because she is out of her Rx. She would like to know if PCP would like for her to continue taking them? Pt says that she is current out. If so, please refill.   Please advise.   CB:  [phone]  Pharmacy: [id number] - [place], [place] AT [address]

## 2015-06-18 NOTE — Telephone Encounter (Signed)
Refill done.  

## 2015-06-18 NOTE — Telephone Encounter (Signed)
Patient is referring to potassium

## 2015-07-07 DIAGNOSIS — H50012 Monocular esotropia, left eye: Secondary | ICD-10-CM | POA: Diagnosis not present

## 2015-10-01 DIAGNOSIS — H25813 Combined forms of age-related cataract, bilateral: Secondary | ICD-10-CM | POA: Diagnosis not present

## 2015-10-01 DIAGNOSIS — H04123 Dry eye syndrome of bilateral lacrimal glands: Secondary | ICD-10-CM | POA: Diagnosis not present

## 2015-10-01 DIAGNOSIS — H401122 Primary open-angle glaucoma, left eye, moderate stage: Secondary | ICD-10-CM | POA: Diagnosis not present

## 2015-10-01 DIAGNOSIS — H40051 Ocular hypertension, right eye: Secondary | ICD-10-CM | POA: Diagnosis not present

## 2015-10-05 ENCOUNTER — Encounter: Payer: Medicare Other | Admitting: Family Medicine

## 2015-10-27 DIAGNOSIS — H50012 Monocular esotropia, left eye: Secondary | ICD-10-CM | POA: Diagnosis not present

## 2015-11-23 DIAGNOSIS — Z23 Encounter for immunization: Secondary | ICD-10-CM | POA: Diagnosis not present

## 2015-12-28 ENCOUNTER — Other Ambulatory Visit: Payer: Self-pay | Admitting: Family Medicine

## 2016-01-18 ENCOUNTER — Other Ambulatory Visit: Payer: Self-pay | Admitting: Family Medicine

## 2016-01-18 DIAGNOSIS — Z1231 Encounter for screening mammogram for malignant neoplasm of breast: Secondary | ICD-10-CM

## 2016-01-28 ENCOUNTER — Ambulatory Visit (INDEPENDENT_AMBULATORY_CARE_PROVIDER_SITE_OTHER): Payer: Medicare Other | Admitting: Family Medicine

## 2016-01-28 ENCOUNTER — Encounter: Payer: Self-pay | Admitting: Family Medicine

## 2016-01-28 VITALS — BP 146/74 | HR 62 | Temp 98.4°F | Ht 67.0 in | Wt 126.4 lb

## 2016-01-28 DIAGNOSIS — R946 Abnormal results of thyroid function studies: Secondary | ICD-10-CM

## 2016-01-28 DIAGNOSIS — H4010X0 Unspecified open-angle glaucoma, stage unspecified: Secondary | ICD-10-CM

## 2016-01-28 DIAGNOSIS — R7989 Other specified abnormal findings of blood chemistry: Secondary | ICD-10-CM

## 2016-01-28 DIAGNOSIS — E559 Vitamin D deficiency, unspecified: Secondary | ICD-10-CM | POA: Diagnosis not present

## 2016-01-28 DIAGNOSIS — E782 Mixed hyperlipidemia: Secondary | ICD-10-CM | POA: Diagnosis not present

## 2016-01-28 DIAGNOSIS — I10 Essential (primary) hypertension: Secondary | ICD-10-CM

## 2016-01-28 DIAGNOSIS — Z0000 Encounter for general adult medical examination without abnormal findings: Secondary | ICD-10-CM

## 2016-01-28 DIAGNOSIS — E876 Hypokalemia: Secondary | ICD-10-CM | POA: Diagnosis not present

## 2016-01-28 HISTORY — DX: Other specified abnormal findings of blood chemistry: R79.89

## 2016-01-28 LAB — COMPREHENSIVE METABOLIC PANEL
ALT: 13 U/L (ref 0–35)
AST: 24 U/L (ref 0–37)
Albumin: 4.6 g/dL (ref 3.5–5.2)
Alkaline Phosphatase: 63 U/L (ref 39–117)
BUN: 14 mg/dL (ref 6–23)
CO2: 29 mEq/L (ref 19–32)
Calcium: 10.1 mg/dL (ref 8.4–10.5)
Chloride: 100 mEq/L (ref 96–112)
Creatinine, Ser: 0.8 mg/dL (ref 0.40–1.20)
GFR: 75.21 mL/min (ref >60.00)
Glucose, Bld: 83 mg/dL (ref 70–99)
Potassium: 3.2 mEq/L — ABNORMAL LOW (ref 3.5–5.1)
Sodium: 135 mEq/L (ref 135–145)
Total Bilirubin: 0.7 mg/dL (ref 0.2–1.2)
Total Protein: 8 g/dL (ref 6.0–8.3)

## 2016-01-28 LAB — LIPID PANEL
Cholesterol: 198 mg/dL (ref 0–200)
HDL: 67.9 mg/dL (ref >39.00)
LDL Cholesterol: 110 mg/dL — ABNORMAL HIGH (ref 0–99)
NonHDL: 130.04
Total CHOL/HDL Ratio: 3
Triglycerides: 99 mg/dL (ref 0.0–149.0)
VLDL: 19.8 mg/dL (ref 0.0–40.0)

## 2016-01-28 LAB — CBC
HCT: 37.8 % (ref 36.0–46.0)
Hemoglobin: 13 g/dL (ref 12.0–15.0)
MCHC: 34.4 g/dL (ref 30.0–36.0)
MCV: 93.6 fl (ref 78.0–100.0)
Platelets: 216 10*3/uL (ref 150.0–400.0)
RBC: 4.03 Mil/uL (ref 3.87–5.11)
RDW: 13.9 % (ref 11.5–15.5)
WBC: 5.3 10*3/uL (ref 4.0–10.5)

## 2016-01-28 LAB — TSH: TSH: 2.24 u[IU]/mL (ref 0.35–4.50)

## 2016-01-28 LAB — T4, FREE: Free T4: 0.85 ng/dL (ref 0.60–1.60)

## 2016-01-28 LAB — VITAMIN D 25 HYDROXY (VIT D DEFICIENCY, FRACTURES): VITD: 52.82 ng/mL (ref 30.00–100.00)

## 2016-01-28 NOTE — Assessment & Plan Note (Signed)
Elevated thyroid simul IG so we will repeat labs today patient asymptomatic

## 2016-01-28 NOTE — Progress Notes (Signed)
Patient ID: Suzanne Turner, female   DOB: 1945/01/25, 71 y.o.   MRN: [id number]

## 2016-01-28 NOTE — Assessment & Plan Note (Signed)
Check cmp today 

## 2016-01-28 NOTE — Progress Notes (Signed)
Subjective:    Patient ID: Suzanne Turner, female    DOB: 1945-09-09, 71 y.o.   MRN: [id number]  No chief complaint on file.   HPI Patient is in today for annual physical and follow up on numerous medical concerns. She feels well today. No recent illness but did have trouble with double vision and was worked up for [employer] disease and myasathenia gravis. But work up was negative. Then after a prism was placed in her leeft eye her vision ha improved. He has been started on Timolol for her Glaucoma and her pressure is imrpoved. Her blood pressure has been good at home. No recent illness otherwise or acute concerns. Denies CP/palp/SOB/HA/congestion/fevers/GI or GU c/o. Taking meds as prescribed. She has been maintaining a heart healthy diet and staying active.   Past Medical History:  Diagnosis Date  . Abnormal thyroid blood test 01/28/2016  . Bronchitis   . Cataract   . Chronic eczema   . Glaucoma    Left eye  . Glaucoma 07/28/2014   Left eye Dr Shon Hough at [facility]  . H/O measles   . H/O mumps   . High cholesterol    no medications  . History of chicken pox   . Hypercalcemia 08/02/2014  . Hypertension   . Kidney stone   . 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   Left eye Dr Shon Hough at [facility]   . Osteoarthritis   . Osteopenia   . Preventative health care 01/30/2016  . Salivary duct stone   . Wears glasses     Past Surgical History:  Procedure Laterality Date  . ORIF [place] Right 1966  . SALIVARY STONE REMOVAL Right 05/15/2012   Procedure: EXCISION OF RIGHT SUBMANDIBULAR GLAND ;  Surgeon: Izora Gala, MD;  Location: [place];  Service: ENT;  Laterality: Right;  . SALIVARY STONE REMOVAL N/A 11/18/2012   Procedure: INTRAORAL EXCISION SUBMANDIBULAR STONE RIGHT ;  Surgeon: Izora Gala, MD;  Location: [place];  Service: ENT;  Laterality: N/A;  . SKIN BIOPSY Right 12/2012   legs  . TONSILLECTOMY  1949  . TUBAL  LIGATION  1977    Family History  Problem Relation Age of Onset  . Arrhythmia Mother   . Colon cancer Mother 82  . Cancer Mother   . Tremor Mother   . Arrhythmia Maternal Grandmother   . Glaucoma Maternal Grandmother   . Breast cancer Sister   . Cancer Sister     breast  . Glaucoma Sister   . Heart disease Father 30  . Arthritis Father     broken back with chronic pain  . Glaucoma Father   . Heart disease Brother   . Arrhythmia Brother   . Tremor Brother   . Hypertension Daughter   . Arrhythmia Maternal Uncle   . Heart disease Maternal Uncle   . Colon polyps Maternal Aunt     Social History   Social History  . Marital status: Widowed    Spouse name: N/A  . Number of children: N/A  . Years of education: N/A   Occupational History  . Not on file.   Social History Main Topics  . Smoking status: Current Every Day Smoker    Packs/day: 0.50    Years: 40.00    Types: Cigarettes  . Smokeless tobacco: Never Used  . Alcohol use No  . Drug use: No  . Sexual activity: Not Currently     Comment: lives alone, widowed,  retired from [employer], education. and real estate, no dietary restrictions   Other Topics Concern  . Not on file   Social History Narrative  . No narrative on file    Outpatient Medications Prior to Visit  Medication Sig Dispense Refill  . acetaminophen (TYLENOL) 325 MG tablet Take 325 mg by mouth daily as needed for pain.    Marland Kitchen aspirin EC 81 MG tablet Take 81 mg by mouth daily.      . calcium carbonate (OS-CAL) 600 MG TABS Take 600 mg by mouth 3 (three) times daily with meals.     . hydrochlorothiazide (HYDRODIURIL) 25 MG tablet TAKE 1/2 TABLET BY MOUTH THREE TIMES PER WEEK. 19 tablet 0  . metoprolol succinate (TOPROL-XL) 25 MG 24 hr tablet Take 1 tablet (25 mg total) by mouth daily. 90 tablet 3  . Multiple Vitamins-Minerals (HAIR/SKIN/NAILS/BIOTIN PO) Take 500 mg by mouth 3 (three) times daily.    . potassium chloride SA (K-DUR,KLOR-CON) 20  MEQ tablet TAKE 1 TABLET BY MOUTH EVERY DAY WITH YOUR DIURETIC MEDICATION 30 tablet 2  . TRAVATAN Z 0.004 % SOLN ophthalmic solution at bedtime.      No facility-administered medications prior to visit.     Allergies  Allergen Reactions  . Medrol [Methylprednisolone] Other (See Comments)    swelling  . Lisinopril Rash  . Losartan Anxiety  . Penicillins Rash  . Sulfonamide Derivatives Rash    Review of Systems  Constitutional: Negative for fever and malaise/fatigue.  HENT: Negative for congestion and hearing loss.   Eyes: Positive for double vision. Negative for blurred vision, photophobia, pain, discharge and redness.  Respiratory: Negative for cough.   Cardiovascular: Negative for chest pain and palpitations.  Gastrointestinal: Negative for blood in stool, constipation, diarrhea, heartburn, melena and vomiting.  Genitourinary: Negative for frequency.  Musculoskeletal: Negative for back pain.  Skin: Negative for rash.  Neurological: Negative for loss of consciousness and headaches.       Objective:    Physical Exam  Constitutional: She is oriented to person, place, and time. She appears well-developed and well-nourished. No distress.  HENT:  Head: Normocephalic and atraumatic.  Eyes: Conjunctivae are normal.  Neck: Normal range of motion. No thyromegaly present.  Cardiovascular: Normal rate and regular rhythm.   Pulmonary/Chest: Effort normal and breath sounds normal. She has no wheezes.  Abdominal: Soft. Bowel sounds are normal. She exhibits no distension and no mass. There is no tenderness. There is no guarding.  Musculoskeletal: She exhibits no edema or deformity.  Lymphadenopathy:    She has no cervical adenopathy.  Neurological: She is alert and oriented to person, place, and time. No cranial nerve deficit. Coordination normal.  Skin: Skin is warm and dry. She is not diaphoretic.  Psychiatric: She has a normal mood and affect.    BP (!) 146/74 (BP Location: Left  Arm, Patient Position: Sitting, Cuff Size: Normal)   Pulse 62   Temp 98.4 F (36.9 C) (Oral)   Ht 5\' 7"  (1.702 m)   Wt 126 lb 6.4 oz (57.3 kg)   SpO2 100% Comment: RA  BMI 19.80 kg/m  Wt Readings from Last 3 Encounters:  01/28/16 126 lb 6.4 oz (57.3 kg)  07/28/14 127 lb 4 oz (57.7 kg)  04/17/14 131 lb 9.6 oz (59.7 kg)     Lab Results  Component Value Date   WBC 5.3 01/28/2016   HGB 13.0 01/28/2016   HCT 37.8 01/28/2016   PLT 216.0 01/28/2016   GLUCOSE 83  01/28/2016   CHOL 198 01/28/2016   TRIG 99.0 01/28/2016   HDL 67.90 01/28/2016   LDLCALC 110 (H) 01/28/2016   ALT 13 01/28/2016   AST 24 01/28/2016   NA 135 01/28/2016   K 3.2 (L) 01/28/2016   CL 100 01/28/2016   CREATININE 0.80 01/28/2016   BUN 14 01/28/2016   CO2 29 01/28/2016   TSH 2.24 01/28/2016    Lab Results  Component Value Date   TSH 2.24 01/28/2016   Lab Results  Component Value Date   WBC 5.3 01/28/2016   HGB 13.0 01/28/2016   HCT 37.8 01/28/2016   MCV 93.6 01/28/2016   PLT 216.0 01/28/2016   Lab Results  Component Value Date   NA 135 01/28/2016   K 3.2 (L) 01/28/2016   CO2 29 01/28/2016   GLUCOSE 83 01/28/2016   BUN 14 01/28/2016   CREATININE 0.80 01/28/2016   BILITOT 0.7 01/28/2016   ALKPHOS 63 01/28/2016   AST 24 01/28/2016   ALT 13 01/28/2016   PROT 8.0 01/28/2016   ALBUMIN 4.6 01/28/2016   CALCIUM 10.1 01/28/2016   GFR 75.21 01/28/2016   Lab Results  Component Value Date   CHOL 198 01/28/2016   Lab Results  Component Value Date   HDL 67.90 01/28/2016   Lab Results  Component Value Date   LDLCALC 110 (H) 01/28/2016   Lab Results  Component Value Date   TRIG 99.0 01/28/2016   Lab Results  Component Value Date   CHOLHDL 3 01/28/2016   No results found for: HGBA1C    I acted as a Education administrator for Dr. Charlett Blake. Raiford Noble, RMA  Assessment & Plan:   Problem List Items Addressed This Visit    Vitamin D deficiency    Check level today, takes a multivitamin      Relevant  Orders   VITAMIN D 25 Hydroxy (Vit-D Deficiency, Fractures) (Completed)   Essential hypertension    Well controlled, no changes to meds. Encouraged heart healthy diet such as the DASH diet and exercise as tolerated.       Hyperlipidemia - Primary    Encouraged heart healthy diet, increase exercise, avoid trans fats, consider a krill oil cap daily      Relevant Orders   Lipid panel (Completed)   Open-angle glaucoma    Takes Timolol for Glaucoma has significant side effects, notes increased cough, runny nose. Recurrent pruritic rash on right leg. All flared with Timolol. Was also having leg pain with initial use. Now better. Pressure much better. Follows with Dr Kathrin Penner.       Relevant Medications   timolol (BETIMOL) 0.5 % ophthalmic solution   Hypokalemia    Check cmp today      Relevant Orders   CBC (Completed)   Comprehensive metabolic panel (Completed)   Abnormal thyroid blood test    Elevated thyroid simul IG so we will repeat labs today patient asymptomatic      Relevant Orders   TSH (Completed)   T4, free (Completed)   Thyroid Stimulating Immunoglobulin   Preventative health care    Patient encouraged to maintain heart healthy diet, regular exercise, adequate sleep. Consider daily probiotics. Take medications as prescribed         I am having Ms. Carlota Raspberry maintain her aspirin EC, Multiple Vitamins-Minerals (HAIR/SKIN/NAILS/BIOTIN PO), calcium carbonate, acetaminophen, TRAVATAN Z, metoprolol succinate, potassium chloride SA, hydrochlorothiazide, and timolol.  Meds ordered this encounter  Medications  . timolol (BETIMOL) 0.5 % ophthalmic solution    Sig:  1 drop 2 (two) times daily.    CMA served as Education administrator during this visit. History, Physical and Plan performed by medical provider. Documentation and orders reviewed and attested to. Penni Homans, MD

## 2016-01-28 NOTE — Assessment & Plan Note (Signed)
Encouraged heart healthy diet, increase exercise, avoid trans fats, consider a krill oil cap daily 

## 2016-01-28 NOTE — Assessment & Plan Note (Signed)
Check level today, takes a multivitamin

## 2016-01-28 NOTE — Assessment & Plan Note (Signed)
Well controlled, no changes to meds. Encouraged heart healthy diet such as the DASH diet and exercise as tolerated.  °

## 2016-01-28 NOTE — Patient Instructions (Addendum)
Spoke with Patient about the benefits of [place] and to make sure that the Office has a copy of it. Preventive Care 10 Years and Older, Female Preventive care refers to lifestyle choices and visits with your health care provider that can promote health and wellness. What does preventive care include?  A yearly physical exam. This is also called an annual well check.  Dental exams once or twice a year.  Routine eye exams. Ask your health care provider how often you should have your eyes checked.  Personal lifestyle choices, including:  Daily care of your teeth and gums.  Regular physical activity.  Eating a healthy diet.  Avoiding tobacco and drug use.  Limiting alcohol use.  Practicing safe sex.  Taking low-dose aspirin every day.  Taking vitamin and mineral supplements as recommended by your health care provider. What happens during an annual well check? The services and screenings done by your health care provider during your annual well check will depend on your age, overall health, lifestyle risk factors, and family history of disease. Counseling  Your health care provider may ask you questions about your:  Alcohol use.  Tobacco use.  Drug use.  Emotional well-being.  Home and relationship well-being.  Sexual activity.  Eating habits.  History of falls.  Memory and ability to understand (cognition).  Work and work Statistician.  Reproductive health. Screening  You may have the following tests or measurements:  Height, weight, and BMI.  Blood pressure.  Lipid and cholesterol levels. These may be checked every 5 years, or more frequently if you are over 46 years old.  Skin check.  Lung cancer screening. You may have this screening every year starting at age 80 if you have a 30-pack-year history of smoking and currently smoke or have quit within the past 15 years.  Fecal occult blood test (FOBT) of the stool. You may have this test  every year starting at age 28.  Flexible sigmoidoscopy or colonoscopy. You may have a sigmoidoscopy every 5 years or a colonoscopy every 10 years starting at age 54.  Hepatitis C blood test.  Hepatitis B blood test.  Sexually transmitted disease (STD) testing.  Diabetes screening. This is done by checking your blood sugar (glucose) after you have not eaten for a while (fasting). You may have this done every 1-3 years.  Bone density scan. This is done to screen for osteoporosis. You may have this done starting at age 35.  Mammogram. This may be done every 1-2 years. Talk to your health care provider about how often you should have regular mammograms. Talk with your health care provider about your test results, treatment options, and if necessary, the need for more tests. Vaccines  Your health care provider may recommend certain vaccines, such as:  Influenza vaccine. This is recommended every year.  Tetanus, diphtheria, and acellular pertussis (Tdap, Td) vaccine. You may need a Td booster every 10 years.  Varicella vaccine. You may need this if you have not been vaccinated.  Zoster vaccine. You may need this after age 66.  Measles, mumps, and rubella (MMR) vaccine. You may need at least one dose of MMR if you were born in 1957 or later. You may also need a second dose.  Pneumococcal 13-valent conjugate (PCV13) vaccine. One dose is recommended after age 28.  Pneumococcal polysaccharide (PPSV23) vaccine. One dose is recommended after age 89.  Meningococcal vaccine. You may need this if you have certain conditions.  Hepatitis A vaccine. You  may need this if you have certain conditions or if you travel or work in places where you may be exposed to hepatitis A.  Hepatitis B vaccine. You may need this if you have certain conditions or if you travel or work in places where you may be exposed to hepatitis B.  Haemophilus influenzae type b (Hib) vaccine. You may need this if you have  certain conditions. Talk to your health care provider about which screenings and vaccines you need and how often you need them. This information is not intended to replace advice given to you by your health care provider. Make sure you discuss any questions you have with your health care provider. Document Released: 02/05/2015 Document Revised: 09/29/2015 Document Reviewed: 11/10/2014 Elsevier Interactive Patient Education  2017 [employer].

## 2016-01-28 NOTE — Assessment & Plan Note (Addendum)
Takes Timolol for Glaucoma has significant side effects, notes increased cough, runny nose. Recurrent pruritic rash on right leg. All flared with Timolol. Was also having leg pain with initial use. Now better. Pressure much better. Follows with Dr Kathrin Penner.

## 2016-01-28 NOTE — Progress Notes (Signed)
Pre visit review using our clinic review tool, if applicable. No additional management support is needed unless otherwise documented below in the visit note. 

## 2016-01-30 ENCOUNTER — Encounter: Payer: Self-pay | Admitting: Family Medicine

## 2016-01-30 DIAGNOSIS — Z0000 Encounter for general adult medical examination without abnormal findings: Secondary | ICD-10-CM

## 2016-01-30 DIAGNOSIS — Z7189 Other specified counseling: Secondary | ICD-10-CM | POA: Insufficient documentation

## 2016-01-30 HISTORY — DX: Encounter for general adult medical examination without abnormal findings: Z00.00

## 2016-01-30 NOTE — Assessment & Plan Note (Signed)
Encouraged to get adequate exercise, calcium and vitamin d intake 

## 2016-01-30 NOTE — Assessment & Plan Note (Signed)
Patient encouraged to maintain heart healthy diet, regular exercise, adequate sleep. Consider daily probiotics. Take medications as prescribed 

## 2016-01-31 ENCOUNTER — Other Ambulatory Visit: Payer: Self-pay

## 2016-01-31 ENCOUNTER — Other Ambulatory Visit: Payer: Self-pay | Admitting: Family Medicine

## 2016-01-31 DIAGNOSIS — E876 Hypokalemia: Secondary | ICD-10-CM

## 2016-01-31 MED ORDER — POTASSIUM CHLORIDE CRYS ER 20 MEQ PO TBCR: EXTENDED_RELEASE_TABLET | ORAL | 0 refills | Status: DC

## 2016-02-02 LAB — THYROID STIMULATING IMMUNOGLOBULIN: TSI: 97 % baseline (ref <140)

## 2016-02-08 ENCOUNTER — Other Ambulatory Visit (INDEPENDENT_AMBULATORY_CARE_PROVIDER_SITE_OTHER): Payer: Medicare Other

## 2016-02-08 DIAGNOSIS — E876 Hypokalemia: Secondary | ICD-10-CM

## 2016-02-08 LAB — COMPREHENSIVE METABOLIC PANEL
ALT: 12 U/L (ref 0–35)
AST: 19 U/L (ref 0–37)
Albumin: 4.2 g/dL (ref 3.5–5.2)
Alkaline Phosphatase: 54 U/L (ref 39–117)
BUN: 15 mg/dL (ref 6–23)
CO2: 28 mEq/L (ref 19–32)
Calcium: 9.9 mg/dL (ref 8.4–10.5)
Chloride: 102 mEq/L (ref 96–112)
Creatinine, Ser: 0.85 mg/dL (ref 0.40–1.20)
GFR: 70.12 mL/min (ref >60.00)
Glucose, Bld: 79 mg/dL (ref 70–99)
Potassium: 3.9 mEq/L (ref 3.5–5.1)
Sodium: 133 mEq/L — ABNORMAL LOW (ref 135–145)
Total Bilirubin: 0.5 mg/dL (ref 0.2–1.2)
Total Protein: 7.4 g/dL (ref 6.0–8.3)

## 2016-02-11 ENCOUNTER — Other Ambulatory Visit: Payer: Self-pay | Admitting: Family Medicine

## 2016-02-11 MED ORDER — POTASSIUM CHLORIDE CRYS ER 20 MEQ PO TBCR: 20.0000 meq | EXTENDED_RELEASE_TABLET | Freq: Every day | ORAL | 0 refills | Status: DC

## 2016-02-21 ENCOUNTER — Ambulatory Visit
Admission: RE | Admit: 2016-02-21 | Discharge: 2016-02-21 | Disposition: A | Discharge status: Home / Self Care | Payer: Medicare Other | Source: Ambulatory Visit | Attending: Family Medicine | Admitting: Family Medicine

## 2016-02-21 DIAGNOSIS — Z1231 Encounter for screening mammogram for malignant neoplasm of breast: Secondary | ICD-10-CM | POA: Diagnosis not present

## 2016-03-01 DIAGNOSIS — H40051 Ocular hypertension, right eye: Secondary | ICD-10-CM | POA: Diagnosis not present

## 2016-03-01 DIAGNOSIS — H5000 Unspecified esotropia: Secondary | ICD-10-CM | POA: Diagnosis not present

## 2016-03-01 DIAGNOSIS — H524 Presbyopia: Secondary | ICD-10-CM | POA: Diagnosis not present

## 2016-03-01 DIAGNOSIS — H401122 Primary open-angle glaucoma, left eye, moderate stage: Secondary | ICD-10-CM | POA: Diagnosis not present

## 2016-04-07 ENCOUNTER — Other Ambulatory Visit: Payer: Self-pay | Admitting: Family Medicine

## 2016-04-12 DIAGNOSIS — H04123 Dry eye syndrome of bilateral lacrimal glands: Secondary | ICD-10-CM | POA: Diagnosis not present

## 2016-04-12 DIAGNOSIS — H401122 Primary open-angle glaucoma, left eye, moderate stage: Secondary | ICD-10-CM | POA: Diagnosis not present

## 2016-04-12 DIAGNOSIS — H40051 Ocular hypertension, right eye: Secondary | ICD-10-CM | POA: Diagnosis not present

## 2016-04-12 DIAGNOSIS — H25813 Combined forms of age-related cataract, bilateral: Secondary | ICD-10-CM | POA: Diagnosis not present

## 2016-04-19 DIAGNOSIS — H401122 Primary open-angle glaucoma, left eye, moderate stage: Secondary | ICD-10-CM | POA: Diagnosis not present

## 2016-05-31 DIAGNOSIS — H401122 Primary open-angle glaucoma, left eye, moderate stage: Secondary | ICD-10-CM | POA: Diagnosis not present

## 2016-05-31 DIAGNOSIS — H04123 Dry eye syndrome of bilateral lacrimal glands: Secondary | ICD-10-CM | POA: Diagnosis not present

## 2016-05-31 DIAGNOSIS — H40011 Open angle with borderline findings, low risk, right eye: Secondary | ICD-10-CM | POA: Diagnosis not present

## 2016-06-09 ENCOUNTER — Other Ambulatory Visit: Payer: Self-pay | Admitting: Cardiology

## 2016-06-29 ENCOUNTER — Encounter: Payer: Self-pay | Admitting: *Deleted

## 2016-06-29 ENCOUNTER — Ambulatory Visit (INDEPENDENT_AMBULATORY_CARE_PROVIDER_SITE_OTHER): Payer: Medicare Other | Admitting: *Deleted

## 2016-06-29 VITALS — BP 124/86 | HR 72 | Ht 66.5 in | Wt 124.4 lb

## 2016-06-29 DIAGNOSIS — Z780 Asymptomatic menopausal state: Secondary | ICD-10-CM

## 2016-06-29 DIAGNOSIS — Z0000 Encounter for general adult medical examination without abnormal findings: Secondary | ICD-10-CM

## 2016-06-29 NOTE — Patient Instructions (Addendum)
Suzanne Turner , Thank you for taking time to come for your Medicare Wellness Visit. I appreciate your ongoing commitment to your health goals. Please review the following plan we discussed and let me know if I can assist you in the future.   Bring a copy of your advance directives to your next office visit.  Call [phone] to schedule your follow-up w/ Dr. Claris Gladden office.  These are the goals we discussed: Goals    . Maintain current health status (pt-stated)       This is a list of the screening recommended for you and due dates:  Health Maintenance  Topic Date Due  .  Hepatitis C: One time screening is recommended by Center for Disease Control  (CDC) for  adults born from [birth year] through 1965.   [birth year]  . Pneumonia vaccines (2 of 2 - PPSV23) 05/11/2015  . Flu Shot  08/23/2016  . Mammogram  02/20/2018  . Colon Cancer Screening  08/16/2018  . Tetanus Vaccine  03/31/2024  . DEXA scan (bone density measurement)  Completed    Preventive Care 65 Years and Older, Female Preventive care refers to lifestyle choices and visits with your health care provider that can promote health and wellness. What does preventive care include?  A yearly physical exam. This is also called an annual well check.  Dental exams once or twice a year.  Routine eye exams. Ask your health care provider how often you should have your eyes checked.  Personal lifestyle choices, including: ? Daily care of your teeth and gums. ? Regular physical activity. ? Eating a healthy diet. ? Avoiding tobacco and drug use. ? Limiting alcohol use. ? Practicing safe sex. ? Taking low-dose aspirin every day. ? Taking vitamin and mineral supplements as recommended by your health care provider. What happens during an annual well check? The services and screenings done by your health care provider during your annual well check will depend on your age, overall health, lifestyle risk factors, and family history of  disease. Counseling Your health care provider may ask you questions about your:  Alcohol use.  Tobacco use.  Drug use.  Emotional well-being.  Home and relationship well-being.  Sexual activity.  Eating habits.  History of falls.  Memory and ability to understand (cognition).  Work and work Statistician.  Reproductive health.  Screening You may have the following tests or measurements:  Height, weight, and BMI.  Blood pressure.  Lipid and cholesterol levels. These may be checked every 5 years, or more frequently if you are over 2 years old.  Skin check.  Lung cancer screening. You may have this screening every year starting at age 66 if you have a 30-pack-year history of smoking and currently smoke or have quit within the past 15 years.  Fecal occult blood test (FOBT) of the stool. You may have this test every year starting at age 81.  Flexible sigmoidoscopy or colonoscopy. You may have a sigmoidoscopy every 5 years or a colonoscopy every 10 years starting at age 109.  Hepatitis C blood test.  Hepatitis B blood test.  Sexually transmitted disease (STD) testing.  Diabetes screening. This is done by checking your blood sugar (glucose) after you have not eaten for a while (fasting). You may have this done every 1-3 years.  Bone density scan. This is done to screen for osteoporosis. You may have this done starting at age 28.  Mammogram. This may be done every 1-2 years. Talk to your health care provider  about how often you should have regular mammograms.  Talk with your health care provider about your test results, treatment options, and if necessary, the need for more tests. Vaccines Your health care provider may recommend certain vaccines, such as:  Influenza vaccine. This is recommended every year.  Tetanus, diphtheria, and acellular pertussis (Tdap, Td) vaccine. You may need a Td booster every 10 years.  Varicella vaccine. You may need this if you have  not been vaccinated.  Zoster vaccine. You may need this after age 48.  Measles, mumps, and rubella (MMR) vaccine. You may need at least one dose of MMR if you were born in 1957 or later. You may also need a second dose.  Pneumococcal 13-valent conjugate (PCV13) vaccine. One dose is recommended after age 71.  Pneumococcal polysaccharide (PPSV23) vaccine. One dose is recommended after age 37.  Meningococcal vaccine. You may need this if you have certain conditions.  Hepatitis A vaccine. You may need this if you have certain conditions or if you travel or work in places where you may be exposed to hepatitis A.  Hepatitis B vaccine. You may need this if you have certain conditions or if you travel or work in places where you may be exposed to hepatitis B.  Haemophilus influenzae type b (Hib) vaccine. You may need this if you have certain conditions.  Talk to your health care provider about which screenings and vaccines you need and how often you need them. This information is not intended to replace advice given to you by your health care provider. Make sure you discuss any questions you have with your health care provider. Document Released: 02/05/2015 Document Revised: 09/29/2015 Document Reviewed: 11/10/2014 Elsevier Interactive Patient Education  2017 [employer].

## 2016-06-29 NOTE — Progress Notes (Signed)
Subjective:   Suzanne Turner is a 71 y.o. female who presents for Medicare Annual (Subsequent) preventive examination.  Review of Systems:  No ROS.  Medicare Wellness Visit.   Cardiac Risk Factors include: advanced age (>10men, >82 women);dyslipidemia;hypertension  Sleep patterns: no sleep issues.   Home Safety/Smoke Alarms: Feels safe in home. Smoke alarms in place.  Living environment; residence and Firearm Safety: Lives alone in a [place]. no firearms. Seat Belt Safety/Bike Helmet: Wears seat belt.   Counseling:   Eye Exam- Follows w/ Dr. Kathrin [facility]- Dental implants. Follows w/ Dr. Aldona Lento  Female:   Pap- Aged out       Mammo- last 02/21/16. BI-RADS CATEGORY  1: Negative.      Dexa scan- last 02/07/13. Osteopenia.    CCS- last 08/15/13. Normal colonoscopy. 5 year recall.      Objective:     Vitals: BP 124/86   Pulse 72   Ht 5' 6.5" (1.689 m)   Wt 124 lb 6.4 oz (56.4 kg)   SpO2 98%   BMI 19.78 kg/m   Body mass index is 19.78 kg/m.   Tobacco History  Smoking Status  . Current Every Day Smoker  . Packs/day: 0.50  . Years: 40.00  . Types: Cigarettes  Smokeless Tobacco  . Never Used     Ready to quit: No Counseling given: Yes   Past Medical History:  Diagnosis Date  . Abnormal thyroid blood test 01/28/2016  . Bronchitis   . Cataract   . Chronic eczema   . Glaucoma    Left eye  . Glaucoma 07/28/2014   Left eye Dr Shon Hough at [facility]  . H/O measles   . H/O mumps   . High cholesterol    no medications  . History of chicken pox   . Hypercalcemia 08/02/2014  . Hypertension   . Kidney stone   . 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 Osteoarthritis   . Osteopenia   . Preventative health care 01/30/2016  . Salivary duct stone   . Wears glasses    Past Surgical History:  Procedure Laterality Date  . ORIF [place] Right 1966  . REFRACTIVE  SURGERY Left March/April 2018  . SALIVARY STONE REMOVAL Right 05/15/2012   Procedure: EXCISION OF RIGHT SUBMANDIBULAR GLAND ;  Surgeon: Izora Gala, MD;  Location: [place];  Service: ENT;  Laterality: Right;  . SALIVARY STONE REMOVAL N/A 11/18/2012   Procedure: INTRAORAL EXCISION SUBMANDIBULAR STONE RIGHT ;  Surgeon: Izora Gala, MD;  Location: [place];  Service: ENT;  Laterality: N/A;  . SKIN BIOPSY Right 12/2012   legs  . TONSILLECTOMY  1949  . TUBAL LIGATION  1977   Family History  Problem Relation Age of Onset  . Arrhythmia Mother   . Colon cancer Mother 68  . Cancer Mother   . Tremor Mother   . Arrhythmia Maternal Grandmother   . Glaucoma Maternal Grandmother   . Breast cancer Sister   . Cancer Sister        breast  . Glaucoma Sister   . Heart disease Father 17  . Arthritis Father        broken back with chronic pain  . Glaucoma Father   . Heart disease Brother   . Arrhythmia Brother   . Tremor Brother   . Hypertension Daughter   . Arrhythmia Maternal Uncle   .  Heart disease Maternal Uncle   . Colon polyps Maternal Aunt    History  Sexual Activity  . 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 and real estate, no dietary restrictions    Outpatient Encounter Prescriptions as of 06/29/2016  Medication Sig  . acetaminophen (TYLENOL) 325 MG tablet Take 325 mg by mouth daily as needed for pain.  Marland Kitchen aspirin EC 81 MG tablet Take 81 mg by mouth daily.    . Calcium Carb-Cholecalciferol (CALCIUM-VITAMIN D) 500-200 MG-UNIT tablet Take 1 tablet by mouth daily.  . hydrochlorothiazide (HYDRODIURIL) 25 MG tablet TAKE 1/2 TABLET BY MOUTH THREE TIMES PER WEEK.  . metoprolol succinate (TOPROL-XL) 25 MG 24 hr tablet Take 1 tablet (25 mg total) by mouth daily. PT NEEDS APPT, CALL [phone] TO SCHEDULE, 1ST ATTEMPT  . Multiple Vitamins-Minerals (HAIR/SKIN/NAILS/BIOTIN PO) Take 500 mg by mouth 3 (three)  times daily.  . potassium chloride SA (K-DUR,KLOR-CON) 20 MEQ tablet Take 1 tablet (20 mEq total) by mouth daily.  . TRAVATAN Z 0.004 % SOLN ophthalmic solution at bedtime.   . [DISCONTINUED] calcium carbonate (OS-CAL) 600 MG TABS Take 600 mg by mouth 3 (three) times daily with meals.   . [DISCONTINUED] timolol (BETIMOL) 0.5 % ophthalmic solution 1 drop 2 (two) times daily.   No facility-administered encounter medications on file as of 06/29/2016.     Activities of Daily Living In your present state of health, do you have any difficulty performing the following activities: 06/29/2016 01/28/2016  Hearing? N N  Vision? N Y  Difficulty concentrating or making decisions? N N  Walking or climbing stairs? N N  Dressing or bathing? N N  Doing errands, shopping? N N  Preparing Food and eating ? N -  Using the Toilet? N -  In the past six months, have you accidently leaked urine? N -  Do you have problems with loss of bowel control? N -  Managing your Medications? N -  Managing your Finances? N -  Housekeeping or managing your Housekeeping? N -  Some recent data might be hidden    Patient Care Team: Mosie Lukes, MD as PCP - General (Family Medicine) Larey Dresser, MD as Consulting Physician (Cardiology) Shon Hough, MD as Consulting Physician (Ophthalmology) Sherrlyn Hock. Gordy Levan, DDS as Consulting Physician (Dentistry)    Assessment:    Physical assessment deferred to PCP.  Exercise Activities and Dietary recommendations Current Exercise Habits: Structured exercise class, Type of exercise: yoga;Other - see comments;walking (swimming), Frequency (Times/Week): 3  Diet (meal preparation, eat out, water intake, caffeinated beverages, dairy products, fruits and vegetables): in general, a "healthy" diet  , well balanced, on average, 2 meals per day, low fat/ cholesterol, low salt. Describes diet as 'conscientious.' Does not eat much junk food, but snacks often on cookies. Drinks water  throughout the day and occasionally drinks caffeine free diet Coke and some decaf coffee.     Goals    . Maintain current health status (pt-stated)      Fall Risk Fall Risk  06/29/2016 01/28/2016 04/01/2014 07/23/2013 03/13/2013  Falls in the past year? No No No No No  Risk for fall due to : - - - Impaired vision -   Depression Screen PHQ 2/9 Scores 06/29/2016 01/28/2016 04/01/2014 03/13/2013  PHQ - 2 Score 0 0 0 0     Cognitive Function MMSE - Mini Mental State Exam 06/29/2016  Orientation to time 4  Orientation to Place  5  Registration 3  Attention/ Calculation 4  Recall 3  Language- name 2 objects 2  Language- repeat 1  Language- follow 3 step command 3  Language- read & follow direction 1  Write a sentence 1  Copy design 1  Total score 28        Immunization History  Administered Date(s) Administered  . Influenza Split 10/23/2010  . Influenza,inj,Quad PF,36+ Mos 12/11/2012  . Influenza-Unspecified 11/05/2013, 11/16/2014  . Pneumococcal Conjugate-13 06/23/2010, 05/11/2014  . Tdap 04/01/2014  . Zoster 07/24/2006   Screening Tests Health Maintenance  Topic Date Due  . Hepatitis C Screening  11-17-1945  . PNA vac Low Risk Adult (2 of 2 - PPSV23) 05/11/2015  . INFLUENZA VACCINE  08/23/2016  . MAMMOGRAM  02/20/2018  . COLONOSCOPY  08/16/2018  . TETANUS/TDAP  03/31/2024  . DEXA SCAN  Completed      Plan:   Follow-up w/ PCP as scheduled.   Bring a copy of your advance directives to your next office visit.  Order placed for bone density.   Declines Pneumovax today-would like to do this at her next appt in January.   I have personally reviewed and noted the following in the patient's chart:   . Medical and social history . Use of alcohol, tobacco or illicit drugs  . Current medications and supplements . Functional ability and status . Nutritional status . Physical activity . Advanced directives . List of other physicians . Vitals . Screenings to include cognitive,  depression, and falls . Referrals and appointments  In addition, I have reviewed and discussed with patient certain preventive protocols, quality metrics, and best practice recommendations. A written personalized care plan for preventive services as well as general preventive health recommendations were provided to patient.     Dorrene German, RN  06/29/2016

## 2016-07-05 ENCOUNTER — Other Ambulatory Visit: Payer: Self-pay | Admitting: Family Medicine

## 2016-07-14 ENCOUNTER — Other Ambulatory Visit: Payer: Self-pay | Admitting: Cardiology

## 2016-07-18 ENCOUNTER — Other Ambulatory Visit: Payer: Self-pay | Admitting: *Deleted

## 2016-07-18 MED ORDER — METOPROLOL SUCCINATE ER 25 MG PO TB24: 25.0000 mg | ORAL_TABLET | Freq: Every day | ORAL | 0 refills | Status: DC

## 2016-07-18 NOTE — Telephone Encounter (Signed)
Previous patient of Dr Aundra Dubin who has not been seen in over two years calling to request a refill on metoprolol. She has an appointment scheduled with Dr Tamala Julian 08/03/16. I will send in one refill and I have made her aware that she must keep her appointment for further refills to be granted.

## 2016-08-02 ENCOUNTER — Other Ambulatory Visit: Payer: Self-pay | Admitting: Family Medicine

## 2016-08-02 DIAGNOSIS — E2839 Other primary ovarian failure: Secondary | ICD-10-CM

## 2016-08-03 ENCOUNTER — Encounter (INDEPENDENT_AMBULATORY_CARE_PROVIDER_SITE_OTHER): Payer: Self-pay

## 2016-08-03 ENCOUNTER — Encounter: Payer: Self-pay | Admitting: Interventional Cardiology

## 2016-08-03 ENCOUNTER — Ambulatory Visit (INDEPENDENT_AMBULATORY_CARE_PROVIDER_SITE_OTHER): Payer: Medicare Other | Admitting: Interventional Cardiology

## 2016-08-03 VITALS — BP 146/90 | HR 83 | Ht 67.0 in | Wt 125.8 lb

## 2016-08-03 DIAGNOSIS — R002 Palpitations: Secondary | ICD-10-CM | POA: Diagnosis not present

## 2016-08-03 DIAGNOSIS — I10 Essential (primary) hypertension: Secondary | ICD-10-CM | POA: Diagnosis not present

## 2016-08-03 DIAGNOSIS — F17200 Nicotine dependence, unspecified, uncomplicated: Secondary | ICD-10-CM

## 2016-08-03 DIAGNOSIS — E782 Mixed hyperlipidemia: Secondary | ICD-10-CM | POA: Diagnosis not present

## 2016-08-03 MED ORDER — METOPROLOL SUCCINATE ER 25 MG PO TB24: 12.5000 mg | ORAL_TABLET | Freq: Every day | ORAL | 0 refills | Status: DC

## 2016-08-03 NOTE — Progress Notes (Signed)
Cardiology Office Note    Date:  08/03/2016   ID:  Suzanne Turner, DOB 08-22-1945, MRN [id number]  PCP:  Mosie Lukes, MD  Cardiologist: Sinclair Grooms, MD   Chief Complaint  Patient presents with  . Palpitations    History of Present Illness:  Suzanne Turner is a 71 y.o. female history of smoking, palpitations, and HTN . Prior patient of Dr. Aundra Dubin for establishment and longitudinal follow-up.  No real reason to be here other than to get metoprolol refilled. She complains that a rash that she has may be related to metoprolol. She wanted to discuss discontinuing the medicine. No cardiac complaints.  Past Medical History:  Diagnosis Date  . Abnormal thyroid blood test 01/28/2016  . Bronchitis   . Cataract   . Chronic eczema   . Glaucoma    Left eye  . Glaucoma 07/28/2014   [facility] eye Dr Shon [facility]ough at [facility]  . H/O measles   . H/O mumps   . High cholesterol    no medications  . History of chicken pox   . Hypercalcemia 08/02/2014  . Hypertension   . Kidney stone   . 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   Left eye Dr Shon Hough at [facility]   . Osteoarthritis   . Osteopenia   . Preventative health care 01/30/2016  . Salivary duct stone   . Wears glasses     Past Surgical History:  Procedure Laterality Date  . ORIF [place] Right 1966  . REFRACTIVE SURGERY Left March/April 2018  . SALIVARY STONE REMOVAL Right 05/15/2012   Procedure: EXCISION OF RIGHT SUBMANDIBULAR GLAND ;  Surgeon: Izora Gala, MD;  Location: [place];  Service: ENT;  Laterality: Right;  . SALIVARY STONE REMOVAL N/A 11/18/2012   Procedure: INTRAORAL EXCISION SUBMANDIBULAR STONE RIGHT ;  Surgeon: Izora Gala, MD;  Location: [place];  Service: ENT;  Laterality: N/A;  . SKIN BIOPSY Right 12/2012   legs  . TONSILLECTOMY  1949  . TUBAL LIGATION  1977    Current Medications: Outpatient Medications Prior to Visit  Medication Sig Dispense  Refill  . acetaminophen (TYLENOL) 325 MG tablet Take 325 mg by mouth daily as needed for pain.    Marland Kitchen aspirin EC 81 MG tablet Take 81 mg by mouth daily.      . Calcium Carb-Cholecalciferol (CALCIUM-VITAMIN D) 500-200 MG-UNIT tablet Take 1 tablet by mouth daily.    . hydrochlorothiazide (HYDRODIURIL) 25 MG tablet TAKE 1/2 TABLET BY MOUTH THREE TIMES PER WEEK. 19 tablet 0  . metoprolol succinate (TOPROL-XL) 25 MG 24 hr tablet Take 1 tablet (25 mg total) by mouth daily. *Patient must keep 08/03/16 appointment for further refills* 30 tablet 0  . Multiple Vitamins-Minerals (HAIR/SKIN/NAILS/BIOTIN PO) Take 500 mg by mouth 3 (three) times daily.    . TRAVATAN Z 0.004 % SOLN ophthalmic solution Place 2 drops into both eyes at bedtime.     . potassium chloride SA (K-DUR,KLOR-CON) 20 MEQ tablet Take 1 tablet (20 mEq total) by mouth daily. (Patient not taking: Reported on 08/03/2016) 90 tablet 0   No facility-administered medications prior to visit.      Allergies:   Medrol [methylprednisolone]; Lisinopril; Losartan; Penicillins; Sulfonamide derivatives; and Timolol   Social History   Social History  . Marital status: Widowed    Spouse name: N/A  . Number of children: N/A  . Years of education: N/A   Social History Main Topics  .  Smoking status: Current Every Day Smoker    Packs/day: 0.50    Years: 40.00    Types: Cigarettes  . Smokeless tobacco: Never Used  . Alcohol use No  . Drug use: No  . 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 and real estate, no dietary restrictions   Other Topics Concern  . None   Social History Narrative  . None     Family History:  The patient's family history includes Arrhythmia in her brother, maternal grandmother, maternal uncle, and mother; Arthritis in her father; Breast cancer in her sister; Cancer in her mother and sister; Colon cancer (age of onset: 70) in her mother; Colon polyps in her  maternal aunt; Glaucoma in her father, maternal grandmother, and sister; Heart disease in her brother and maternal uncle; Heart disease (age of onset: 39) in her father; Hypertension in her daughter; Tremor in her brother and mother.   ROS:   Please see the history of present illness.    Multiple allergies to antihypertensive therapy including ACE inhibitor is, ARB, and beta blockers. Difficulty with double vision, rash, and headaches.  All other systems reviewed and are negative.   PHYSICAL EXAM:   VS:  BP (!) 146/90 (BP Location: Left Arm)   Pulse 83   Ht 5\' 7"  (1.702 m)   Wt 125 lb 12.8 oz (57.1 kg)   BMI 19.70 kg/m    GEN: Well nourished, well developed, in no acute distress  HEENT: normal  Neck: no JVD, carotid bruits, or masses Cardiac: RRR; no murmurs, rubs, or gallops,no edema  Respiratory:  clear to auscultation bilaterally, normal work of breathing GI: soft, nontender, nondistended, + BS MS: no deformity or atrophy  Skin: warm and dry, no rash Neuro:  Alert and Oriented x 3, Strength and sensation are intact Psych: euthymic mood, full affect  Wt Readings from Last 3 Encounters:  08/03/16 125 lb 12.8 oz (57.1 kg)  06/29/16 124 lb 6.4 oz (56.4 kg)  01/28/16 126 lb 6.4 oz (57.3 kg)      Studies/Labs Reviewed:   EKG:  EKG  Normal sinus rhythm, nonspecific ST abnormality, prominent voltage, and when compared to prior, heart rate is faster.  Recent Labs: 01/28/2016: Hemoglobin 13.0; Platelets 216.0; TSH 2.24 02/08/2016: ALT 12; BUN 15; Creatinine, Ser 0.85; Potassium 3.9; Sodium 133   Lipid Panel    Component Value Date/Time   CHOL 198 01/28/2016 1201   TRIG 99.0 01/28/2016 1201   HDL 67.90 01/28/2016 1201   CHOLHDL 3 01/28/2016 1201   VLDL 19.8 01/28/2016 1201   LDLCALC 110 (H) 01/28/2016 1201    Additional studies/ records that were reviewed today include:  none    ASSESSMENT:    1. Essential hypertension   2. Palpitations   3. Mixed hyperlipidemia     4. Smoking      PLAN:  In order of problems listed above:  1. Borderline elevated today, some of which is related to situation. She monitors her pressure at home and states it is almost always less than 140/90 mmHg. 2. None are occurring and no specific complaints. 3. Not addressed 4. Not addressed   Wean and discontinue metoprolol succinate by decreasing to 12.5 mg daily for 7-14 days and discontinuing. No specific cardiology follow-up is needed unless new symptoms. Follow-up with primary care, Dr. Randel Pigg.    Medication Adjustments/Labs and Tests Ordered: Current medicines are reviewed at length with the patient today.  Concerns regarding medicines are outlined above.  Medication changes, Labs and Tests ordered today are listed in the Patient Instructions below. There are no Patient Instructions on file for this visit.   Signed, Sinclair Grooms, MD  08/03/2016 1:19 PM    [facility] [address], [place], [place]  [address] Phone: [phone]; Fax: [phone]

## 2016-08-03 NOTE — Patient Instructions (Signed)
Medication Instructions:  1) DECREASE Metoprolol to 12.5mg  once daily for 2-4 weeks, then discontinue.  Labwork: None  Testing/Procedures: None  Follow-Up: Your physician recommends that you schedule a follow-up appointment as needed with Dr. Tamala Julian.    Any Other Special Instructions Will Be Listed Below (If Applicable).     If you need a refill on your cardiac medications before your next appointment, please call your pharmacy.

## 2016-08-15 ENCOUNTER — Ambulatory Visit
Admission: RE | Admit: 2016-08-15 | Discharge: 2016-08-15 | Disposition: A | Discharge status: Home / Self Care | Payer: Medicare Other | Source: Ambulatory Visit | Attending: Family Medicine | Admitting: Family Medicine

## 2016-08-15 DIAGNOSIS — E2839 Other primary ovarian failure: Secondary | ICD-10-CM

## 2016-08-15 DIAGNOSIS — M810 Age-related osteoporosis without current pathological fracture: Secondary | ICD-10-CM | POA: Diagnosis not present

## 2016-08-16 ENCOUNTER — Other Ambulatory Visit: Payer: Self-pay | Admitting: *Deleted

## 2016-08-23 ENCOUNTER — Telehealth: Payer: Self-pay | Admitting: Family Medicine

## 2016-08-23 NOTE — Telephone Encounter (Signed)
Pt called in for her bone density results. She said that it is okay to leave a voicemail if no answer. Pt said that she also is going to try to set up her mychart.

## 2016-08-24 NOTE — Telephone Encounter (Signed)
Spoke with patient she will come in next week with Dr. Charlett Blake to discuss options.   PC

## 2016-08-24 NOTE — Telephone Encounter (Signed)
Left vmail for pt to call the office back.    PC

## 2016-08-28 DIAGNOSIS — H401122 Primary open-angle glaucoma, left eye, moderate stage: Secondary | ICD-10-CM | POA: Diagnosis not present

## 2016-08-28 DIAGNOSIS — H40051 Ocular hypertension, right eye: Secondary | ICD-10-CM | POA: Diagnosis not present

## 2016-08-29 ENCOUNTER — Ambulatory Visit (INDEPENDENT_AMBULATORY_CARE_PROVIDER_SITE_OTHER): Payer: Medicare Other | Admitting: Family Medicine

## 2016-08-29 ENCOUNTER — Encounter: Payer: Self-pay | Admitting: Family Medicine

## 2016-08-29 DIAGNOSIS — I10 Essential (primary) hypertension: Secondary | ICD-10-CM

## 2016-08-29 DIAGNOSIS — M810 Age-related osteoporosis without current pathological fracture: Secondary | ICD-10-CM | POA: Diagnosis not present

## 2016-08-29 NOTE — Patient Instructions (Signed)
Prolia injections twice a year to treat osteoporosis Osteoporosis Osteoporosis is the thinning and loss of density in the bones. Osteoporosis makes the bones more brittle, fragile, and likely to break (fracture). Over time, osteoporosis can cause the bones to become so weak that they fracture after a simple fall. The bones most likely to fracture are the bones in the hip, wrist, and spine. What are the causes? The exact cause is not known. What increases the risk? Anyone can develop osteoporosis. You may be at greater risk if you have a family history of the condition or have poor nutrition. You may also have a higher risk if you are:  Female.  68 years old or older.  A smoker.  Not physically active.  White or Asian.  Slender.  What are the signs or symptoms? A fracture might be the first sign of the disease, especially if it results from a fall or injury that would not usually cause a bone to break. Other signs and symptoms include:  Low back and neck pain.  Stooped posture.  Height loss.  How is this diagnosed? To make a diagnosis, your health care provider may:  Take a medical history.  Perform a physical exam.  Order tests, such as: ? A bone mineral density test. ? A dual-energy X-ray absorptiometry test.  How is this treated? The goal of osteoporosis treatment is to strengthen your bones to reduce your risk of a fracture. Treatment may involve:  Making lifestyle changes, such as: ? Eating a diet rich in calcium. ? Doing weight-bearing and muscle-strengthening exercises. ? Stopping tobacco use. ? Limiting alcohol intake.  Taking medicine to slow the process of bone loss or to increase bone density.  Monitoring your levels of calcium and vitamin D.  Follow these instructions at home:  Include calcium and vitamin D in your diet. Calcium is important for bone health, and vitamin D helps the body absorb calcium.  Perform weight-bearing and  muscle-strengthening exercises as directed by your health care provider.  Do not use any tobacco products, including cigarettes, chewing tobacco, and electronic cigarettes. If you need help quitting, ask your health care provider.  Limit your alcohol intake.  Take medicines only as directed by your health care provider.  Keep all follow-up visits as directed by your health care provider. This is important.  Take precautions at home to lower your risk of falling, such as: ? Keeping rooms well lit and clutter free. ? Installing safety rails on stairs. ? Using rubber mats in the bathroom and other areas that are often wet or slippery. Get help right away if: You fall or injure yourself. This information is not intended to replace advice given to you by your health care provider. Make sure you discuss any questions you have with your health care provider. Document Released: 10/19/2004 Document Revised: 06/14/2015 Document Reviewed: 06/19/2013 Elsevier Interactive Patient Education  2017 [employer].

## 2016-08-30 ENCOUNTER — Other Ambulatory Visit: Payer: Self-pay | Admitting: Family Medicine

## 2016-08-30 NOTE — Assessment & Plan Note (Signed)
She is frustrated with the progression but acknowledges that she has not been exercising. She declines any medications after a long discussion regarding risks and benefits. She will let us know if she changes her mind. She reports her father suffered from osteonecrosis of the jaw on Fosamax. Recommend calcium intake of 1200 to 1500 mg daily, divided into roughly 3 doses. Best source is the diet and a single dairy serving is about 500 mg, a supplement of calcium citrate once or twice daily to balance diet is fine if not getting enough in diet. Also need Vitamin D 2000 IU caps, 1 cap daily if not already taking vitamin D. Also recommend weight baring exercise on hips and upper body to keep bones strong

## 2016-08-30 NOTE — Progress Notes (Signed)
Patient ID: Suzanne Turner, female   DOB: 01/25/1945, 71 y.o.   MRN: [id number]   Subjective:    Patient ID: Suzanne Turner, female    DOB: [birth year], 71 y.o.   MRN: [id number]  Chief Complaint  Patient presents with  . Results    Bone Density results    HPI Patient is in today for discussion of her osteoporosis. She just had a Dexa scan which revealed progression from osteopenia to osteoporosis . She has not been exercising but she is taking a calcium vitamin D combo daily. Denies CP/palp/SOB/HA/congestion/fevers/GI or GU c/o. Taking meds as prescribed  Past Medical History:  Diagnosis Date  . Abnormal thyroid blood test 01/28/2016  . Bronchitis   . Cataract   . Chronic eczema   . Glaucoma    Left eye  . Glaucoma 07/28/2014   Left eye Dr Shon Hough at [facility]  . H/O measles   . H/O mumps   . High cholesterol    no medications  . History of chicken pox   . Hypercalcemia 08/02/2014  . Hypertension   . Kidney stone   . 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 Osteoarthritis   . Osteopenia   . Preventative health care 01/30/2016  . Salivary duct stone   . Wears glasses     Past Surgical History:  Procedure Laterality Date  . ORIF [place] Right 1966  . REFRACTIVE SURGERY Left March/April 2018  . SALIVARY STONE REMOVAL Right 05/15/2012   Procedure: EXCISION OF RIGHT SUBMANDIBULAR GLAND ;  Surgeon: Izora Gala, MD;  Location: [place];  Service: ENT;  Laterality: Right;  . SALIVARY STONE REMOVAL N/A 11/18/2012   Procedure: INTRAORAL EXCISION SUBMANDIBULAR STONE RIGHT ;  Surgeon: Izora Gala, MD;  Location: [place];  Service: ENT;  Laterality: N/A;  . SKIN BIOPSY Right 12/2012   legs  . TONSILLECTOMY  1949  . TUBAL LIGATION  1977    Family History  Problem Relation Age of Onset  . Arrhythmia Mother   . Colon cancer Mother 37  . Cancer Mother   . Tremor Mother   .  Arrhythmia Maternal Grandmother   . Glaucoma Maternal Grandmother   . Breast cancer Sister   . Cancer Sister        breast  . Glaucoma Sister   . Heart disease Father 52  . Arthritis Father        broken back with chronic pain  . Glaucoma Father   . Heart disease Brother   . Arrhythmia Brother   . Tremor Brother   . Hypertension Daughter   . Arrhythmia Maternal Uncle   . Heart disease Maternal Uncle   . Colon polyps Maternal Aunt     Social History   Social History  . Marital status: Widowed    Spouse name: N/A  . Number of children: N/A  . Years of education: N/A   Occupational History  . Not on file.   Social History Main Topics  . Smoking status: Current Every Day Smoker    Packs/day: 0.50    Years: 40.00    Types: Cigarettes  . Smokeless tobacco: Never Used  . Alcohol use No  . Drug use: No  . 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 and real estate, no dietary restrictions   Other Topics Concern  . Not  on file   Social History Narrative  . No narrative on file    Outpatient Medications Prior to Visit  Medication Sig Dispense Refill  . acetaminophen (TYLENOL) 325 MG tablet Take 325 mg by mouth daily as needed for pain.    Marland Kitchen aspirin EC 81 MG tablet Take 81 mg by mouth daily.      . Calcium Carb-Cholecalciferol (CALCIUM-VITAMIN D) 500-200 MG-UNIT tablet Take 1 tablet by mouth daily.    . hydrochlorothiazide (HYDRODIURIL) 25 MG tablet TAKE 1/2 TABLET BY MOUTH THREE TIMES PER WEEK. 19 tablet 0  . Multiple Vitamins-Minerals (HAIR/SKIN/NAILS/BIOTIN PO) Take 500 mg by mouth 3 (three) times daily.    . potassium chloride SA (K-DUR,KLOR-CON) 20 MEQ tablet Take 20 mEq by mouth 3 (three) times a week. (Take with hydrochlorothiazide)    . TRAVATAN Z 0.004 % SOLN ophthalmic solution Place 2 drops into both eyes at bedtime.      No facility-administered medications prior to visit.     Allergies    Allergen Reactions  . Medrol [Methylprednisolone] Swelling    Facial and lip swelling  . Lisinopril Rash  . Losartan Anxiety  . Penicillins Rash  . Sulfonamide Derivatives Rash  . Timolol Rash    Review of Systems  Constitutional: Negative for fever and malaise/fatigue.  HENT: Negative for congestion.   Eyes: Negative for blurred vision.  Respiratory: Negative for shortness of breath.   Cardiovascular: Negative for chest pain, palpitations and leg swelling.  Gastrointestinal: Negative for abdominal pain, blood in stool and nausea.  Genitourinary: Negative for dysuria and frequency.  Musculoskeletal: Negative for falls.  Skin: Negative for rash.  Neurological: Negative for dizziness, loss of consciousness and headaches.  Endo/Heme/Allergies: Negative for environmental allergies.  Psychiatric/Behavioral: Negative for depression. The patient is not nervous/anxious.        Objective:    Physical Exam  Constitutional: She is oriented to person, place, and time. She appears well-developed and well-nourished. No distress.  HENT:  Head: Normocephalic and atraumatic.  Nose: Nose normal.  Eyes: Right eye exhibits no discharge. Left eye exhibits no discharge.  Neck: Normal range of motion. Neck supple.  Cardiovascular: Normal rate and regular rhythm.   No murmur heard. Pulmonary/Chest: Effort normal and breath sounds normal.  Abdominal: Soft. Bowel sounds are normal. There is no tenderness.  Musculoskeletal: She exhibits no edema.  Neurological: She is alert and oriented to person, place, and time.  Skin: Skin is warm and dry.  Psychiatric: She has a normal mood and affect.  Nursing note and vitals reviewed.   BP 130/80 (BP Location: Left Arm, Patient Position: Sitting, Cuff Size: Normal)   Pulse 74   Temp 98.1 F (36.7 C) (Oral)   Ht 5\' 7"  (1.702 m)   Wt 124 lb 6 oz (56.4 kg)   SpO2 98%   BMI 19.48 kg/m  Wt Readings from Last 3 Encounters:  08/29/16 124 lb 6 oz (56.4  kg)  08/03/16 125 lb 12.8 oz (57.1 kg)  06/29/16 124 lb 6.4 oz (56.4 kg)     Lab Results  Component Value Date   WBC 5.3 01/28/2016   HGB 13.0 01/28/2016   HCT 37.8 01/28/2016   PLT 216.0 01/28/2016   GLUCOSE 79 02/08/2016   CHOL 198 01/28/2016   TRIG 99.0 01/28/2016   HDL 67.90 01/28/2016   LDLCALC 110 (H) 01/28/2016   ALT 12 02/08/2016   AST 19 02/08/2016   NA 133 (L) 02/08/2016   K 3.9 02/08/2016  CL 102 02/08/2016   CREATININE 0.85 02/08/2016   BUN 15 02/08/2016   CO2 28 02/08/2016   TSH 2.24 01/28/2016    Lab Results  Component Value Date   TSH 2.24 01/28/2016   Lab Results  Component Value Date   WBC 5.3 01/28/2016   HGB 13.0 01/28/2016   HCT 37.8 01/28/2016   MCV 93.6 01/28/2016   PLT 216.0 01/28/2016   Lab Results  Component Value Date   NA 133 (L) 02/08/2016   K 3.9 02/08/2016   CO2 28 02/08/2016   GLUCOSE 79 02/08/2016   BUN 15 02/08/2016   CREATININE 0.85 02/08/2016   BILITOT 0.5 02/08/2016   ALKPHOS 54 02/08/2016   AST 19 02/08/2016   ALT 12 02/08/2016   PROT 7.4 02/08/2016   ALBUMIN 4.2 02/08/2016   CALCIUM 9.9 02/08/2016   GFR 70.12 02/08/2016   Lab Results  Component Value Date   CHOL 198 01/28/2016   Lab Results  Component Value Date   HDL 67.90 01/28/2016   Lab Results  Component Value Date   LDLCALC 110 (H) 01/28/2016   Lab Results  Component Value Date   TRIG 99.0 01/28/2016   Lab Results  Component Value Date   CHOLHDL 3 01/28/2016   No results found for: HGBA1C     Assessment & Plan:   Problem List Items Addressed This Visit    Essential hypertension    Well controlled, no changes to meds. Encouraged heart healthy diet such as the DASH diet and exercise as tolerated.       Osteoporosis    She is frustrated with the progression but acknowledges that she has not been exercising. She declines any medications after a long discussion regarding risks and benefits. She will let us know if she changes her mind. She  reports her father suffered from osteonecrosis of the jaw on Fosamax. Recommend calcium intake of 1200 to 1500 mg daily, divided into roughly 3 doses. Best source is the diet and a single dairy serving is about 500 mg, a supplement of calcium citrate once or twice daily to balance diet is fine if not getting enough in diet. Also need Vitamin D 2000 IU caps, 1 cap daily if not already taking vitamin D. Also recommend weight baring exercise on hips and upper body to keep bones strong         I am having Ms. Carlota Raspberry maintain her aspirin EC, Multiple Vitamins-Minerals (HAIR/SKIN/NAILS/BIOTIN PO), acetaminophen, TRAVATAN Z, calcium-vitamin D, hydrochlorothiazide, potassium chloride SA, and brimonidine.  Meds ordered this encounter  Medications  . brimonidine (ALPHAGAN) 0.15 % ophthalmic solution    Sig: Place 1 drop into the left eye 2 (two) times daily.    Penni Homans, MD

## 2016-08-30 NOTE — Assessment & Plan Note (Signed)
Well controlled, no changes to meds. Encouraged heart healthy diet such as the DASH diet and exercise as tolerated.  °

## 2016-09-13 ENCOUNTER — Other Ambulatory Visit: Payer: Self-pay | Admitting: Family Medicine

## 2016-09-14 NOTE — Telephone Encounter (Signed)
Faxed HCTZ/thx dmf

## 2016-09-27 DIAGNOSIS — H401122 Primary open-angle glaucoma, left eye, moderate stage: Secondary | ICD-10-CM | POA: Diagnosis not present

## 2016-10-25 DIAGNOSIS — H401122 Primary open-angle glaucoma, left eye, moderate stage: Secondary | ICD-10-CM | POA: Diagnosis not present

## 2016-11-13 DIAGNOSIS — Z23 Encounter for immunization: Secondary | ICD-10-CM | POA: Diagnosis not present

## 2016-11-29 DIAGNOSIS — H16103 Unspecified superficial keratitis, bilateral: Secondary | ICD-10-CM | POA: Diagnosis not present

## 2016-11-29 DIAGNOSIS — H04123 Dry eye syndrome of bilateral lacrimal glands: Secondary | ICD-10-CM | POA: Diagnosis not present

## 2016-11-29 DIAGNOSIS — H40011 Open angle with borderline findings, low risk, right eye: Secondary | ICD-10-CM | POA: Diagnosis not present

## 2016-12-04 ENCOUNTER — Other Ambulatory Visit: Payer: Self-pay | Admitting: Family Medicine

## 2016-12-27 DIAGNOSIS — H16202 Unspecified keratoconjunctivitis, left eye: Secondary | ICD-10-CM | POA: Diagnosis not present

## 2016-12-27 DIAGNOSIS — H40051 Ocular hypertension, right eye: Secondary | ICD-10-CM | POA: Diagnosis not present

## 2016-12-27 DIAGNOSIS — H04123 Dry eye syndrome of bilateral lacrimal glands: Secondary | ICD-10-CM | POA: Diagnosis not present

## 2016-12-27 DIAGNOSIS — H401122 Primary open-angle glaucoma, left eye, moderate stage: Secondary | ICD-10-CM | POA: Diagnosis not present

## 2017-01-03 DIAGNOSIS — H16103 Unspecified superficial keratitis, bilateral: Secondary | ICD-10-CM | POA: Diagnosis not present

## 2017-01-03 DIAGNOSIS — H401132 Primary open-angle glaucoma, bilateral, moderate stage: Secondary | ICD-10-CM | POA: Diagnosis not present

## 2017-01-03 DIAGNOSIS — H25813 Combined forms of age-related cataract, bilateral: Secondary | ICD-10-CM | POA: Diagnosis not present

## 2017-01-03 DIAGNOSIS — H16202 Unspecified keratoconjunctivitis, left eye: Secondary | ICD-10-CM | POA: Diagnosis not present

## 2017-01-18 ENCOUNTER — Other Ambulatory Visit: Payer: Self-pay | Admitting: Family Medicine

## 2017-01-18 DIAGNOSIS — Z1231 Encounter for screening mammogram for malignant neoplasm of breast: Secondary | ICD-10-CM

## 2017-01-29 ENCOUNTER — Ambulatory Visit (INDEPENDENT_AMBULATORY_CARE_PROVIDER_SITE_OTHER): Payer: Medicare Other | Admitting: Family Medicine

## 2017-01-29 ENCOUNTER — Ambulatory Visit (HOSPITAL_BASED_OUTPATIENT_CLINIC_OR_DEPARTMENT_OTHER)
Admission: RE | Admit: 2017-01-29 | Discharge: 2017-01-29 | Disposition: A | Discharge status: Home / Self Care | Payer: Medicare Other | Source: Ambulatory Visit | Attending: Family Medicine | Admitting: Family Medicine

## 2017-01-29 ENCOUNTER — Encounter: Payer: Self-pay | Admitting: Family Medicine

## 2017-01-29 VITALS — BP 118/80 | HR 73 | Temp 97.7°F | Resp 18 | Wt 128.8 lb

## 2017-01-29 DIAGNOSIS — I10 Essential (primary) hypertension: Secondary | ICD-10-CM

## 2017-01-29 DIAGNOSIS — M47896 Other spondylosis, lumbar region: Secondary | ICD-10-CM | POA: Diagnosis not present

## 2017-01-29 DIAGNOSIS — M25552 Pain in left hip: Secondary | ICD-10-CM | POA: Insufficient documentation

## 2017-01-29 NOTE — Assessment & Plan Note (Signed)
Worse over past few months with increased exercise. She will continue moderate exercise, Tylenol bid, Lidocaine gel prn and referred to sports med for further consideration

## 2017-01-29 NOTE — Progress Notes (Signed)
Subjective:  I acted as a Education administrator for Dr. Charlett Blake. [place], [place]  Patient ID: Suzanne Turner, female    DOB: [birth year], 72 y.o.   MRN: [id number]  No chief complaint on file.   HPI  Patient is in today for an acute visit for left hip pain. She has been exercising, going to the gym and walking every day. No recent fall or injury. Her hip hurts more after a long day or a long walk and is better with rest. No incontinence but with severe pain she can have radiculopathy to her foot rarely. Denies CP/palp/SOB/HA/congestion/fevers/GI or GU c/o. Taking meds as prescribed.   Patient Care Team: Mosie Lukes, MD as PCP - General (Family Medicine) Larey Dresser, MD as Consulting Physician (Cardiology) Shon Hough, MD as Consulting Physician (Ophthalmology) Sherrlyn Hock. Gordy Levan, DDS as Consulting Physician (Dentistry)   Past Medical History:  Diagnosis Date  . Abnormal thyroid blood test 01/28/2016  . Bronchitis   . Cataract   . Chronic eczema   . Glaucoma    Left eye  . Glaucoma 07/28/2014   Left eye Dr Sh[facility] at [facility]  . H/O measles   . H/O mumps   . High cholesterol    no medications  . History of chicken pox   . Hypercalcemia 08/02/2014  . Hypertension   . Kidney stone   . 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 Osteoarthritis   . Osteopenia   . Preventative health care 01/30/2016  . Salivary duct stone   . Wears glasses     Past Surgical History:  Procedure Laterality Date  . ORIF [place] Right 1966  . REFRACTIVE SURGERY Left March/April 2018  . SALIVARY STONE REMOVAL Right 05/15/2012   Procedure: EXCISION OF RIGHT SUBMANDIBULAR GLAND ;  Surgeon: Izora Gala, MD;  Location: [place];  Service: ENT;  Laterality: Right;  . SALIVARY STONE REMOVAL N/A 11/18/2012   Procedure: INTRAORAL EXCISION SUBMANDIBULAR STONE RIGHT ;  Surgeon: Izora Gala, MD;  Location: [place];   Service: ENT;  Laterality: N/A;  . SKIN BIOPSY Right 12/2012   legs  . TONSILLECTOMY  1949  . TUBAL LIGATION  1977    Family History  Problem Relation Age of Onset  . Arrhythmia Mother   . Colon cancer Mother 6  . Cancer Mother   . Tremor Mother   . Arrhythmia Maternal Grandmother   . Glaucoma Maternal Grandmother   . Breast cancer Sister   . Cancer Sister        breast  . Glaucoma Sister   . Heart disease Father 54  . Arthritis Father        broken back with chronic pain  . Glaucoma Father   . Heart disease Brother   . Arrhythmia Brother   . Tremor Brother   . Hypertension Daughter   . Arrhythmia Maternal Uncle   . Heart disease Maternal Uncle   . Colon polyps Maternal Aunt     Social History   Socioeconomic History  . Marital status: Widowed    Spouse name: Not on file  . Number of children: Not on file  . Years of education: Not on file  . Highest education level: Not on file  Social Needs  . Financial resource strain: Not on file  . Food insecurity - worry: Not on file  . Food insecurity - inability: Not on file  .  Transportation needs - medical: Not on file  . Transportation needs - non-medical: Not on file  Occupational History  . Not on file  Tobacco Use  . Smoking status: Current Every Day Smoker    Packs/day: 0.50    Years: 40.00    Pack years: 20.00    Types: Cigarettes  . Smokeless tobacco: Never Used  Substance and Sexual Activity  . Alcohol use: No  . Drug use: No  . 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 and real estate, no dietary restrictions  Other Topics Concern  . Not on file  Social History Narrative  . Not on file    Outpatient Medications Prior to Visit  Medication Sig Dispense Refill  . acetaminophen (TYLENOL) 325 MG tablet Take 325 mg by mouth daily as needed for pain.    Marland Kitchen aspirin EC 81 MG tablet Take 81 mg by mouth daily.      . Calcium  Carb-Cholecalciferol (CALCIUM-VITAMIN D) 500-200 MG-UNIT tablet Take 1 tablet by mouth daily.    . hydrochlorothiazide (HYDRODIURIL) 25 MG tablet TAKE 1/2 TABLET BY MOUTH THREE TIMES PER WEEK. 19 tablet 0  . Multiple Vitamins-Minerals (HAIR/SKIN/NAILS/BIOTIN PO) Take 500 mg by mouth 3 (three) times daily.    . potassium chloride SA (K-DUR,KLOR-CON) 20 MEQ tablet Take 20 mEq by mouth 3 (three) times a week. (Take with hydrochlorothiazide)    . potassium chloride SA (K-DUR,KLOR-CON) 20 MEQ tablet TAKE 1 TABLET BY MOUTH DAILY 90 tablet 0  . TRAVATAN Z 0.004 % SOLN ophthalmic solution Place 2 drops into both eyes at bedtime.     . brimonidine (ALPHAGAN) 0.15 % ophthalmic solution Place 1 drop into the left eye 2 (two) times daily.     No facility-administered medications prior to visit.     Allergies  Allergen Reactions  . Medrol [Methylprednisolone] Swelling    Facial and lip swelling  . Lisinopril Rash  . Losartan Anxiety  . Penicillins Rash  . Sulfonamide Derivatives Rash  . Timolol Rash    Review of Systems  Constitutional: Negative for fever and malaise/fatigue.  HENT: Negative for congestion.   Eyes: Negative for blurred vision.  Respiratory: Negative for shortness of breath.   Cardiovascular: Negative for chest pain, palpitations and leg swelling.  Gastrointestinal: Negative for abdominal pain, blood in stool and nausea.  Genitourinary: Negative for dysuria and frequency.  Musculoskeletal: Positive for joint pain. Negative for falls.  Skin: Negative for rash.  Neurological: Negative for dizziness, loss of consciousness and headaches.  Endo/Heme/Allergies: Negative for environmental allergies.  Psychiatric/Behavioral: Negative for depression. The patient is not nervous/anxious.        Objective:    Physical Exam  Constitutional: She is oriented to person, place, and time. She appears well-developed and well-nourished. No distress.  HENT:  Head: Normocephalic and  atraumatic.  Nose: Nose normal.  Eyes: Right eye exhibits no discharge. Left eye exhibits no discharge.  Neck: Normal range of motion. Neck supple.  Cardiovascular: Normal rate and regular rhythm.  No murmur heard. Pulmonary/Chest: Effort normal and breath sounds normal.  Abdominal: Soft. Bowel sounds are normal. There is no tenderness.  Musculoskeletal: She exhibits no edema.  Neurological: She is alert and oriented to person, place, and time.  Skin: Skin is warm and dry.  Psychiatric: She has a normal mood and affect.  Nursing note and vitals reviewed.   BP 118/80 (BP Location: Left Arm, Patient Position: Sitting, Cuff Size: Normal)  Pulse 73   Temp 97.7 F (36.5 C) (Oral)   Resp 18   Wt 128 lb 12.8 oz (58.4 kg)   SpO2 99%   BMI 20.17 kg/m  Wt Readings from Last 3 Encounters:  01/29/17 128 lb 12.8 oz (58.4 kg)  08/29/16 124 lb 6 oz (56.4 kg)  08/03/16 125 lb 12.8 oz (57.1 kg)   BP Readings from Last 3 Encounters:  01/29/17 118/80  08/29/16 130/80  08/03/16 (!) 146/90     Immunization History  Administered Date(s) Administered  . Influenza Split 10/23/2010  . Influenza,inj,Quad PF,6+ Mos 12/11/2012  . Influenza-Unspecified 11/05/2013, 11/16/2014  . Pneumococcal Conjugate-13 06/23/2010, 05/11/2014  . Tdap 04/01/2014  . Zoster 07/24/2006    Health Maintenance  Topic Date Due  . Hepatitis C Screening  May 15, 1945  . PNA vac Low Risk Adult (2 of 2 - PPSV23) 05/11/2015  . INFLUENZA VACCINE  08/23/2016  . MAMMOGRAM  02/20/2018  . COLONOSCOPY  08/16/2018  . TETANUS/TDAP  03/31/2024  . DEXA SCAN  Completed    Lab Results  Component Value Date   WBC 5.3 01/28/2016   HGB 13.0 01/28/2016   HCT 37.8 01/28/2016   PLT 216.0 01/28/2016   GLUCOSE 79 02/08/2016   CHOL 198 01/28/2016   TRIG 99.0 01/28/2016   HDL 67.90 01/28/2016   LDLCALC 110 (H) 01/28/2016   ALT 12 02/08/2016   AST 19 02/08/2016   NA 133 (L) 02/08/2016   K 3.9 02/08/2016   CL 102 02/08/2016    CREATININE 0.85 02/08/2016   BUN 15 02/08/2016   CO2 28 02/08/2016   TSH 2.24 01/28/2016    Lab Results  Component Value Date   TSH 2.24 01/28/2016   Lab Results  Component Value Date   WBC 5.3 01/28/2016   HGB 13.0 01/28/2016   HCT 37.8 01/28/2016   MCV 93.6 01/28/2016   PLT 216.0 01/28/2016   Lab Results  Component Value Date   NA 133 (L) 02/08/2016   K 3.9 02/08/2016   CO2 28 02/08/2016   GLUCOSE 79 02/08/2016   BUN 15 02/08/2016   CREATININE 0.85 02/08/2016   BILITOT 0.5 02/08/2016   ALKPHOS 54 02/08/2016   AST 19 02/08/2016   ALT 12 02/08/2016   PROT 7.4 02/08/2016   ALBUMIN 4.2 02/08/2016   CALCIUM 9.9 02/08/2016   GFR 70.12 02/08/2016   Lab Results  Component Value Date   CHOL 198 01/28/2016   Lab Results  Component Value Date   HDL 67.90 01/28/2016   Lab Results  Component Value Date   LDLCALC 110 (H) 01/28/2016   Lab Results  Component Value Date   TRIG 99.0 01/28/2016   Lab Results  Component Value Date   CHOLHDL 3 01/28/2016   No results found for: HGBA1C       Assessment & Plan:   Problem List Items Addressed This Visit    Essential hypertension    Well controlled, no changes to meds. Encouraged heart healthy diet such as the DASH diet and exercise as tolerated.       Left hip pain - Primary    Worse over past few months with increased exercise. She will continue moderate exercise, Tylenol bid, Lidocaine gel prn and referred to sports med for further consideration      Relevant Orders   DG HIP UNILAT WITH PELVIS 2-3 VIEWS LEFT   Ambulatory referral to Sports Medicine      I have discontinued Lannette Donath. Ebanks's brimonidine. I am  also having her maintain her aspirin EC, Multiple Vitamins-Minerals (HAIR/SKIN/NAILS/BIOTIN PO), acetaminophen, TRAVATAN Z, calcium-vitamin D, potassium chloride SA, potassium chloride SA, and hydrochlorothiazide.  No orders of the defined types were placed in this encounter.   CMA served as Education administrator  during this visit. History, Physical and Plan performed by medical provider. Documentation and orders reviewed and attested to.  Penni Homans, MD

## 2017-01-29 NOTE — Patient Instructions (Addendum)
shingrix is the new shingles shot. 2 shots over 2-6 months at pharmacy  Tylenol ES twice daily Lidocaine gel or patches by Aspercreme, Icy Hot or Salon Pas make them   Hip Pain The hip is the joint between the upper legs and the lower pelvis. The bones, cartilage, tendons, and muscles of your hip joint support your body and allow you to move around. Hip pain can range from a minor ache to severe pain in one or both of your hips. The pain may be felt on the inside of the hip joint near the groin, or the outside near the buttocks and upper thigh. You may also have swelling or stiffness. Follow these instructions at home: Managing pain, stiffness, and swelling  If directed, apply ice to the injured area. ? Put ice in a plastic bag. ? Place a towel between your skin and the bag. ? Leave the ice on for 20 minutes, 2-3 times a day  Sleep with a pillow between your legs on your most comfortable side.  Avoid any activities that cause pain. General instructions  Take over-the-counter and prescription medicines only as told by your health care provider.  Do any exercises as told by your health care provider.  Record the following: ? How often you have hip pain. ? The location of your pain. ? What the pain feels like. ? What makes the pain worse.  Keep all follow-up visits as told by your health care provider. This is important. Contact a health care provider if:  You cannot put weight on your leg.  Your pain or swelling continues or gets worse after one week.  It gets harder to walk.  You have a fever. Get help right away if:  You fall.  You have a sudden increase in pain and swelling in your hip.  Your hip is red or swollen or very tender to touch. Summary  Hip pain can range from a minor ache to severe pain in one or both of your hips.  The pain may be felt on the inside of the hip joint near the groin, or the outside near the buttocks and upper thigh.  Avoid any  activities that cause pain.  Record how often you have hip pain, the location of the pain, what makes it worse and what it feels like. This information is not intended to replace advice given to you by your health care provider. Make sure you discuss any questions you have with your health care provider. Document Released: 06/29/2009 Document Revised: 12/13/2015 Document Reviewed: 12/13/2015 Elsevier Interactive Patient Education  [employer].

## 2017-01-29 NOTE — Assessment & Plan Note (Signed)
Well controlled, no changes to meds. Encouraged heart healthy diet such as the DASH diet and exercise as tolerated.  °

## 2017-02-02 DIAGNOSIS — H1789 Other corneal scars and opacities: Secondary | ICD-10-CM | POA: Diagnosis not present

## 2017-02-02 DIAGNOSIS — H40051 Ocular hypertension, right eye: Secondary | ICD-10-CM | POA: Diagnosis not present

## 2017-02-02 DIAGNOSIS — H401122 Primary open-angle glaucoma, left eye, moderate stage: Secondary | ICD-10-CM | POA: Diagnosis not present

## 2017-02-02 DIAGNOSIS — H04123 Dry eye syndrome of bilateral lacrimal glands: Secondary | ICD-10-CM | POA: Diagnosis not present

## 2017-02-22 ENCOUNTER — Ambulatory Visit
Admission: RE | Admit: 2017-02-22 | Discharge: 2017-02-22 | Disposition: A | Discharge status: Home / Self Care | Payer: Medicare Other | Source: Ambulatory Visit | Attending: Family Medicine | Admitting: Family Medicine

## 2017-02-22 DIAGNOSIS — Z1231 Encounter for screening mammogram for malignant neoplasm of breast: Secondary | ICD-10-CM | POA: Diagnosis not present

## 2017-03-05 ENCOUNTER — Encounter: Payer: Self-pay | Admitting: Family Medicine

## 2017-03-05 ENCOUNTER — Ambulatory Visit (INDEPENDENT_AMBULATORY_CARE_PROVIDER_SITE_OTHER): Payer: Medicare Other | Admitting: Family Medicine

## 2017-03-05 ENCOUNTER — Other Ambulatory Visit: Payer: Medicare Other

## 2017-03-05 VITALS — BP 128/80 | HR 75 | Temp 97.5°F | Resp 18 | Ht 67.0 in | Wt 130.2 lb

## 2017-03-05 DIAGNOSIS — I10 Essential (primary) hypertension: Secondary | ICD-10-CM | POA: Diagnosis not present

## 2017-03-05 DIAGNOSIS — E782 Mixed hyperlipidemia: Secondary | ICD-10-CM

## 2017-03-05 DIAGNOSIS — E559 Vitamin D deficiency, unspecified: Secondary | ICD-10-CM

## 2017-03-05 DIAGNOSIS — Z7289 Other problems related to lifestyle: Secondary | ICD-10-CM

## 2017-03-05 DIAGNOSIS — E871 Hypo-osmolality and hyponatremia: Secondary | ICD-10-CM | POA: Diagnosis not present

## 2017-03-05 DIAGNOSIS — D508 Other iron deficiency anemias: Secondary | ICD-10-CM

## 2017-03-05 DIAGNOSIS — M25552 Pain in left hip: Secondary | ICD-10-CM | POA: Diagnosis not present

## 2017-03-05 DIAGNOSIS — Z0000 Encounter for general adult medical examination without abnormal findings: Secondary | ICD-10-CM

## 2017-03-05 DIAGNOSIS — M810 Age-related osteoporosis without current pathological fracture: Secondary | ICD-10-CM | POA: Diagnosis not present

## 2017-03-05 DIAGNOSIS — Z8619 Personal history of other infectious and parasitic diseases: Secondary | ICD-10-CM

## 2017-03-05 LAB — CBC
HCT: 37.5 % (ref 35.0–45.0)
Hemoglobin: 12.8 g/dL (ref 11.7–15.5)
MCH: 31.3 pg (ref 27.0–33.0)
MCHC: 34.1 g/dL (ref 32.0–36.0)
MCV: 91.7 fL (ref 80.0–100.0)
MPV: 9.9 fL (ref 7.5–12.5)
Platelets: 228 10*3/uL (ref 140–400)
RBC: 4.09 10*6/uL (ref 3.80–5.10)
RDW: 12.8 % (ref 11.0–15.0)
WBC: 3.8 10*3/uL (ref 3.8–10.8)

## 2017-03-05 LAB — COMPREHENSIVE METABOLIC PANEL
ALT: 16 U/L (ref 0–35)
AST: 22 U/L (ref 0–37)
Albumin: 4.3 g/dL (ref 3.5–5.2)
Alkaline Phosphatase: 47 U/L (ref 39–117)
BUN: 15 mg/dL (ref 6–23)
CO2: 28 mEq/L (ref 19–32)
Calcium: 9.8 mg/dL (ref 8.4–10.5)
Chloride: 102 mEq/L (ref 96–112)
Creatinine, Ser: 0.85 mg/dL (ref 0.40–1.20)
GFR: 69.91 mL/min (ref >60.00)
Glucose, Bld: 75 mg/dL (ref 70–99)
Potassium: 3.6 mEq/L (ref 3.5–5.1)
Sodium: 138 mEq/L (ref 135–145)
Total Bilirubin: 0.6 mg/dL (ref 0.2–1.2)
Total Protein: 7.7 g/dL (ref 6.0–8.3)

## 2017-03-05 LAB — LIPID PANEL
Cholesterol: 192 mg/dL (ref 0–200)
HDL: 64.3 mg/dL (ref >39.00)
LDL Cholesterol: 103 mg/dL — ABNORMAL HIGH (ref 0–99)
NonHDL: 127.54
Total CHOL/HDL Ratio: 3
Triglycerides: 125 mg/dL (ref 0.0–149.0)
VLDL: 25 mg/dL (ref 0.0–40.0)

## 2017-03-05 LAB — VITAMIN D 25 HYDROXY (VIT D DEFICIENCY, FRACTURES): VITD: 46.48 ng/mL (ref 30.00–100.00)

## 2017-03-05 LAB — TSH: TSH: 4.25 u[IU]/mL (ref 0.35–4.50)

## 2017-03-05 NOTE — Patient Instructions (Addendum)
Encouraged increased rest and hydration, add probiotics, zinc such as Coldeze or Xicam. Treat fevers as needed.  Consider Vitamin C 500 mg daily. Elderberry   Check with [facility] did you get the Pneumovax Preventive Care 71 Years and Older, Female Preventive care refers to lifestyle choices and visits with your health care provider that can promote health and wellness. What does preventive care include?  A yearly physical exam. This is also called an annual well check.  Dental exams once or twice a year.  Routine eye exams. Ask your health care provider how often you should have your eyes checked.  Personal lifestyle choices, including: ? Daily care of your teeth and gums. ? Regular physical activity. ? Eating a healthy diet. ? Avoiding tobacco and drug use. ? Limiting alcohol use. ? Practicing safe sex. ? Taking low-dose aspirin every day. ? Taking vitamin and mineral supplements as recommended by your health care provider. What happens during an annual well check? The services and screenings done by your health care provider during your annual well check will depend on your age, overall health, lifestyle risk factors, and family history of disease. Counseling Your health care provider may ask you questions about your:  Alcohol use.  Tobacco use.  Drug use.  Emotional well-being.  Home and relationship well-being.  Sexual activity.  Eating habits.  History of falls.  Memory and ability to understand (cognition).  Work and work Statistician.  Reproductive health.  Screening You may have the following tests or measurements:  Height, weight, and BMI.  Blood pressure.  Lipid and cholesterol levels. These may be checked every 5 years, or more frequently if you are over 25 years old.  Skin check.  Lung cancer screening. You may have this screening every year starting at age 77 if you have a 30-pack-year history of smoking and currently smoke or have quit within  the past 15 years.  Fecal occult blood test (FOBT) of the stool. You may have this test every year starting at age 34.  Flexible sigmoidoscopy or colonoscopy. You may have a sigmoidoscopy every 5 years or a colonoscopy every 10 years starting at age 67.  Hepatitis C blood test.  Hepatitis B blood test.  Sexually transmitted disease (STD) testing.  Diabetes screening. This is done by checking your blood sugar (glucose) after you have not eaten for a while (fasting). You may have this done every 1-3 years.  Bone density scan. This is done to screen for osteoporosis. You may have this done starting at age 47.  Mammogram. This may be done every 1-2 years. Talk to your health care provider about how often you should have regular mammograms.  Talk with your health care provider about your test results, treatment options, and if necessary, the need for more tests. Vaccines Your health care provider may recommend certain vaccines, such as:  Influenza vaccine. This is recommended every year.  Tetanus, diphtheria, and acellular pertussis (Tdap, Td) vaccine. You may need a Td booster every 10 years.  Varicella vaccine. You may need this if you have not been vaccinated.  Zoster vaccine. You may need this after age 53.  Measles, mumps, and rubella (MMR) vaccine. You may need at least one dose of MMR if you were born in 1957 or later. You may also need a second dose.  Pneumococcal 13-valent conjugate (PCV13) vaccine. One dose is recommended after age 56.  Pneumococcal polysaccharide (PPSV23) vaccine. One dose is recommended after age 65.  Meningococcal vaccine. You may  need this if you have certain conditions.  Hepatitis A vaccine. You may need this if you have certain conditions or if you travel or work in places where you may be exposed to hepatitis A.  Hepatitis B vaccine. You may need this if you have certain conditions or if you travel or work in places where you may be exposed to  hepatitis B.  Haemophilus influenzae type b (Hib) vaccine. You may need this if you have certain conditions.  Talk to your health care provider about which screenings and vaccines you need and how often you need them. This information is not intended to replace advice given to you by your health care provider. Make sure you discuss any questions you have with your health care provider. Document Released: 02/05/2015 Document Revised: 09/29/2015 Document Reviewed: 11/10/2014 Elsevier Interactive Patient Education  [employer].

## 2017-03-05 NOTE — Assessment & Plan Note (Signed)
Takes a daily supplement monitor

## 2017-03-05 NOTE — Progress Notes (Signed)
Subjective:  I acted as a Education administrator for Dr. Charlett Blake. [place], [place]  Patient ID: Suzanne Turner, female    DOB: December 25, 1945, 72 y.o.   MRN: [id number]  No chief complaint on file.   HPI  Patient is in today for an annual exam and follow up on chronic medical concerns including hypertension, hypercalcemia and hyperlipidemia. No recent febrile illness or hospitalizations. She has had a couple fo weeks of nasal congestion and sneezing but she feels it is nearly resolved and she has only a mild dry cough. Nonproductive which does not keep her awake. Denies CP/palp/SOB/HA/congestion/fevers/GI or GU c/o. Taking meds as prescribed  Patient Care Team: Mosie Lukes, MD as PCP - General (Family Medicine) Larey Dresser, MD as Consulting Physician (Cardiology) Shon Hough, MD as Consulting Physician (Ophthalmology) Sherrlyn Hock. Gordy Levan, DDS as Consulting Physician (Dentistry)   Past Medical History:  Diagnosis Date  . Abnormal thyroid blood test 01/28/2016  . Bronchitis   . Cataract   . Chronic eczema   . Glaucoma    Left eye  . Glaucoma 07/28/2014   Left eye Dr Shon Hough at [facility]  . H/O measles   . H/O mumps   . High cholesterol    no medications  . History of chicken pox   . Hypercalcemia 08/02/2014  . Hypertension   . Kidney stone   . 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 Osteoarthritis   . Osteopenia   . Preventative health care 01/30/2016  . Salivary duct stone   . Wears glasses     Past Surgical History:  Procedure Laterality Date  . ORIF [place] Right 1966  . REFRACTIVE SURGERY Left March/April 2018  . SALIVARY STONE REMOVAL Right 05/15/2012   Procedure: EXCISION OF RIGHT SUBMANDIBULAR GLAND ;  Surgeon: Izora Gala, MD;  Location: [place];  Service: ENT;  Laterality: Right;  . SALIVARY STONE REMOVAL N/A 11/18/2012   Procedure: INTRAORAL EXCISION SUBMANDIBULAR STONE RIGHT ;  Surgeon:  Izora Gala, MD;  Location: [place];  Service: ENT;  Laterality: N/A;  . SKIN BIOPSY Right 12/2012   legs  . TONSILLECTOMY  1949  . TUBAL LIGATION  1977    Family History  Problem Relation Age of Onset  . Arrhythmia Mother   . Colon cancer Mother 37  . Cancer Mother   . Tremor Mother   . Arrhythmia Maternal Grandmother   . Glaucoma Maternal Grandmother   . Breast cancer Sister   . Cancer Sister        breast  . Glaucoma Sister   . Heart disease Father 46  . Arthritis Father        broken back with chronic pain  . Glaucoma Father   . Heart disease Brother   . Arrhythmia Brother   . Tremor Brother   . Hypertension Daughter   . Arrhythmia Maternal Uncle   . Heart disease Maternal Uncle   . Colon polyps Maternal Aunt     Social History   Socioeconomic History  . Marital status: Widowed    Spouse name: Not on file  . Number of children: Not on file  . Years of education: Not on file  . Highest education level: Not on file  Social Needs  . Financial resource strain: Not on file  . Food insecurity - worry: Not on file  . Food insecurity - inability: Not on file  .  Transportation needs - medical: Not on file  . Transportation needs - non-medical: Not on file  Occupational History  . Not on file  Tobacco Use  . Smoking status: Current Every Day Smoker    Packs/day: 0.50    Years: 40.00    Pack years: 20.00    Types: Cigarettes  . Smokeless tobacco: Never Used  Substance and Sexual Activity  . Alcohol use: No  . Drug use: No  . 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 and real estate, no dietary restrictions  Other Topics Concern  . Not on file  Social History Narrative  . Not on file    Outpatient Medications Prior to Visit  Medication Sig Dispense Refill  . acetaminophen (TYLENOL) 325 MG tablet Take 325 mg by mouth daily as needed for pain.    Marland Kitchen aspirin EC 81 MG tablet  Take 81 mg by mouth daily.      . Calcium Carb-Cholecalciferol (CALCIUM-VITAMIN D) 500-200 MG-UNIT tablet Take 1 tablet by mouth daily.    . hydrochlorothiazide (HYDRODIURIL) 25 MG tablet TAKE 1/2 TABLET BY MOUTH THREE TIMES PER WEEK. 19 tablet 0  . LUMIGAN 0.01 % SOLN Place 1 drop into both eyes at bedtime.  0  . Multiple Vitamins-Minerals (HAIR/SKIN/NAILS/BIOTIN PO) Take 500 mg by mouth 3 (three) times daily.    . potassium chloride SA (K-DUR,KLOR-CON) 20 MEQ tablet Take 20 mEq by mouth 3 (three) times a week. (Take with hydrochlorothiazide)    . potassium chloride SA (K-DUR,KLOR-CON) 20 MEQ tablet TAKE 1 TABLET BY MOUTH DAILY 90 tablet 0  . TRAVATAN Z 0.004 % SOLN ophthalmic solution Place 2 drops into both eyes at bedtime.      No facility-administered medications prior to visit.     Allergies  Allergen Reactions  . Medrol [Methylprednisolone] Swelling    Facial and lip swelling  . Lisinopril Rash  . Losartan Anxiety  . Penicillins Rash  . Sulfonamide Derivatives Rash  . Timolol Rash    Review of Systems  Constitutional: Negative for chills, fever and malaise/fatigue.  HENT: Positive for congestion. Negative for hearing loss.   Eyes: Negative for discharge.  Respiratory: Negative for cough, sputum production and shortness of breath.   Cardiovascular: Negative for chest pain, palpitations and leg swelling.  Gastrointestinal: Negative for abdominal pain, blood in stool, constipation, diarrhea, heartburn, nausea and vomiting.  Genitourinary: Negative for dysuria, frequency, hematuria and urgency.  Musculoskeletal: Negative for back pain, falls and myalgias.  Skin: Negative for rash.  Neurological: Negative for dizziness, sensory change, loss of consciousness, weakness and headaches.  Endo/Heme/Allergies: Negative for environmental allergies. Does not bruise/bleed easily.  Psychiatric/Behavioral: Negative for depression and suicidal ideas. The patient is not nervous/anxious and  does not have insomnia.        Objective:    Physical Exam  Constitutional: She is oriented to person, place, and time. She appears well-developed and well-nourished. No distress.  HENT:  Head: Normocephalic and atraumatic.  Eyes: Conjunctivae are normal.  Neck: Neck supple. No thyromegaly present.  Cardiovascular: Normal rate, regular rhythm and normal heart sounds.  No murmur heard. Pulmonary/Chest: Effort normal and breath sounds normal. No respiratory distress.  Abdominal: Soft. Bowel sounds are normal. She exhibits no distension and no mass. There is no tenderness.  Musculoskeletal: She exhibits no edema.  Lymphadenopathy:    She has no cervical adenopathy.  Neurological: She is alert and oriented to person, place, and time.  Skin: Skin is warm and dry.  Psychiatric: She has a normal mood and affect. Her behavior is normal.    BP 128/80 (BP Location: Left Arm, Patient Position: Sitting, Cuff Size: Normal)   Pulse 75   Temp (!) 97.5 F (36.4 C) (Oral)   Resp 18   Ht 5\' 7"  (1.702 m)   Wt 130 lb 3.2 oz (59.1 kg)   SpO2 98%   BMI 20.39 kg/m  Wt Readings from Last 3 Encounters:  03/05/17 130 lb 3.2 oz (59.1 kg)  01/29/17 128 lb 12.8 oz (58.4 kg)  08/29/16 124 lb 6 oz (56.4 kg)   BP Readings from Last 3 Encounters:  03/05/17 128/80  01/29/17 118/80  08/29/16 130/80     Immunization History  Administered Date(s) Administered  . Influenza Split 10/23/2010  . Influenza,inj,Quad PF,6+ Mos 12/11/2012  . Influenza-Unspecified 11/05/2013, 11/16/2014  . Pneumococcal Conjugate-13 06/23/2010, 05/11/2014  . Tdap 04/01/2014  . Zoster 07/24/2006    Health Maintenance  Topic Date Due  . Hepatitis C Screening  May 10, 1945  . PNA vac Low Risk Adult (2 of 2 - PPSV23) 05/11/2015  . COLONOSCOPY  08/16/2018  . MAMMOGRAM  02/23/2019  . TETANUS/TDAP  03/31/2024  . INFLUENZA VACCINE  Completed  . DEXA SCAN  Completed    Lab Results  Component Value Date   WBC 3.8 03/05/2017     HGB 12.8 03/05/2017   HCT 37.5 03/05/2017   PLT 228 03/05/2017   GLUCOSE 75 03/05/2017   CHOL 192 03/05/2017   TRIG 125.0 03/05/2017   HDL 64.30 03/05/2017   LDLCALC 103 (H) 03/05/2017   ALT 16 03/05/2017   AST 22 03/05/2017   NA 138 03/05/2017   K 3.6 03/05/2017   CL 102 03/05/2017   CREATININE 0.85 03/05/2017   BUN 15 03/05/2017   CO2 28 03/05/2017   TSH 4.25 03/05/2017    Lab Results  Component Value Date   TSH 4.25 03/05/2017   Lab Results  Component Value Date   WBC 3.8 03/05/2017   HGB 12.8 03/05/2017   HCT 37.5 03/05/2017   MCV 91.7 03/05/2017   PLT 228 03/05/2017   Lab Results  Component Value Date   NA 138 03/05/2017   K 3.6 03/05/2017   CO2 28 03/05/2017   GLUCOSE 75 03/05/2017   BUN 15 03/05/2017   CREATININE 0.85 03/05/2017   BILITOT 0.6 03/05/2017   ALKPHOS 47 03/05/2017   AST 22 03/05/2017   ALT 16 03/05/2017   PROT 7.7 03/05/2017   ALBUMIN 4.3 03/05/2017   CALCIUM 9.8 03/05/2017   GFR 69.91 03/05/2017   Lab Results  Component Value Date   CHOL 192 03/05/2017   Lab Results  Component Value Date   HDL 64.30 03/05/2017   Lab Results  Component Value Date   LDLCALC 103 (H) 03/05/2017   Lab Results  Component Value Date   TRIG 125.0 03/05/2017   Lab Results  Component Value Date   CHOLHDL 3 03/05/2017   No results found for: HGBA1C       Assessment & Plan:   Problem List Items Addressed This Visit    Vitamin D deficiency    Takes a daily supplement monitor      Relevant Orders   Comprehensive metabolic panel (Completed)   VITAMIN D 25 Hydroxy (Vit-D Deficiency, Fractures) (Completed)   Essential hypertension    Well controlled, no changes to meds. Encouraged heart healthy diet such as the DASH diet and exercise as tolerated.  Relevant Orders   Comprehensive metabolic panel (Completed)   TSH (Completed)   Osteoporosis    Encouraged to get adequate exercise, calcium and vitamin d intake      Hyperlipidemia     Encouraged heart healthy diet, increase exercise, avoid trans fats, consider a krill oil cap daily      Relevant Orders   Lipid panel (Completed)   History of chicken pox    Is on Shingrix waiting list       Other iron deficiency anemias    Check cbc      Relevant Orders   CBC   Preventative health care    Patient encouraged to maintain heart healthy diet, regular exercise, adequate sleep. Consider daily probiotics. Take medications as prescribed.      Relevant Orders   TSH (Completed)   Left hip pain    Much better responded to lidocaine gel      Hyponatremia    Check cmp today, asymptoms       Other Visit Diagnoses    Other problems related to lifestyle    -  Primary   Relevant Orders   Hepatitis C antibody      I am having Lannette Donath. Carlota Raspberry maintain her aspirin EC, Multiple Vitamins-Minerals (HAIR/SKIN/NAILS/BIOTIN PO), acetaminophen, TRAVATAN Z, calcium-vitamin D, potassium chloride SA, potassium chloride SA, hydrochlorothiazide, and LUMIGAN.  No orders of the defined types were placed in this encounter.   CMA served as Education administrator during this visit. History, Physical and Plan performed by medical provider. Documentation and orders reviewed and attested to.  Penni Homans, MD

## 2017-03-05 NOTE — Assessment & Plan Note (Signed)
Patient encouraged to maintain heart healthy diet, regular exercise, adequate sleep. Consider daily probiotics. Take medications as prescribed 

## 2017-03-05 NOTE — Assessment & Plan Note (Signed)
Much better responded to lidocaine gel

## 2017-03-05 NOTE — Assessment & Plan Note (Signed)
Is on Shingrix waiting list

## 2017-03-05 NOTE — Assessment & Plan Note (Signed)
Check cmp today, asymptoms

## 2017-03-05 NOTE — Assessment & Plan Note (Signed)
Well controlled, no changes to meds. Encouraged heart healthy diet such as the DASH diet and exercise as tolerated.  °

## 2017-03-05 NOTE — Assessment & Plan Note (Signed)
Encouraged heart healthy diet, increase exercise, avoid trans fats, consider a krill oil cap daily 

## 2017-03-05 NOTE — Assessment & Plan Note (Signed)
Encouraged to get adequate exercise, calcium and vitamin d intake 

## 2017-03-05 NOTE — Assessment & Plan Note (Signed)
Check cbc 

## 2017-03-06 LAB — HEPATITIS C ANTIBODY
Hepatitis C Ab: NONREACTIVE
SIGNAL TO CUT-OFF: 0.03 (ref <1.00)

## 2017-03-07 ENCOUNTER — Other Ambulatory Visit: Payer: Self-pay | Admitting: Family Medicine

## 2017-04-06 ENCOUNTER — Ambulatory Visit (INDEPENDENT_AMBULATORY_CARE_PROVIDER_SITE_OTHER): Payer: Medicare Other | Admitting: Medical

## 2017-04-06 ENCOUNTER — Encounter: Payer: Self-pay | Admitting: Medical

## 2017-04-06 VITALS — BP 124/72 | HR 88 | Resp 16 | Ht 67.0 in | Wt 131.2 lb

## 2017-04-06 DIAGNOSIS — L989 Disorder of the skin and subcutaneous tissue, unspecified: Secondary | ICD-10-CM | POA: Diagnosis not present

## 2017-04-06 NOTE — Patient Instructions (Addendum)
For your skin lesion not healing over past 3 weeks will refer you to dermatologist. I thought initially maybe skin infection but your lack of response to clindamycin makes me think cause is not infectious. Also on exam does not look like infection.  Will write the referral and want you to call us by Tuesday next week if no one has contacted you.Suzanne Turner is contact person in our office that dose referral)  Follow up date to be determined here. Depends on derm appoitment.

## 2017-04-06 NOTE — Progress Notes (Addendum)
Subjective:    Patient ID: Suzanne Turner, female    DOB: 28-Oct-1945, 72 y.o.   MRN: [id number]  HPI  Pt in with recent breakdown/irritated region to her left shin region for past 3 weeks. Pt speculates that shaving with dull razor may have initially irritated the area.  No yellow discharge seen. Only sees specs of blood on bandaid that covers area.  Pt states this area is unusual as she is usually a very quick healer.  Pt just recenlty has been on clindamycin for tooth infection. Has been on antibiotic for past 6 days.    Review of Systems  Constitutional: Negative for chills, fatigue and fever.  Respiratory: Negative for cough, chest tightness, shortness of breath and wheezing.   Cardiovascular: Negative for chest pain and palpitations.  Gastrointestinal: Negative for abdominal pain.  Skin:       Skin lesion left lower ext.  Hematological: Negative for adenopathy. Does not bruise/bleed easily.  Psychiatric/Behavioral: Negative for behavioral problems and confusion.    Past Medical History:  Diagnosis Date  . Abnormal thyroid blood test 01/28/2016  . Bronchitis   . Cataract   . Chronic eczema   . Glaucoma    Left eye  . Glaucoma 07/28/2014   Left eye Dr Shon Hough at [facility]  . H/O measles   . H/O mumps   . High cholesterol    no medications  . History of chicken pox   . Hypercalcemia 08/02/2014  . Hypertension   . Kidney stone   . 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 Osteoarthritis   . Osteopenia   . Preventative health care 01/30/2016  . Salivary duct stone   . Wears glasses      Social History   Socioeconomic History  . Marital status: Widowed    Spouse name: Not on file  . Number of children: Not on file  . Years of education: Not on file  . Highest education level: Not on file  Social Needs  . Financial resource strain: Not on file  . Food insecurity - worry: Not on file  .  Food insecurity - inability: Not on file  . Transportation needs - medical: Not on file  . Transportation needs - non-medical: Not on file  Occupational History  . Not on file  Tobacco Use  . Smoking status: Current Every Day Smoker    Packs/day: 0.50    Years: 40.00    Pack years: 20.00    Types: Cigarettes  . Smokeless tobacco: Never Used  Substance and Sexual Activity  . Alcohol use: No  . Drug use: No  . 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 and real estate, no dietary restrictions  Other Topics Concern  . Not on file  Social History Narrative  . Not on file    Past Surgical History:  Procedure Laterality Date  . ORIF [place] Right 1966  . REFRACTIVE SURGERY Left March/April 2018  . SALIVARY STONE REMOVAL Right 05/15/2012   Procedure: EXCISION OF RIGHT SUBMANDIBULAR GLAND ;  Surgeon: Izora Gala, MD;  Location: [place];  Service: ENT;  Laterality: Right;  . SALIVARY STONE REMOVAL N/A 11/18/2012   Procedure: INTRAORAL EXCISION SUBMANDIBULAR STONE RIGHT ;  Surgeon: Izora Gala, MD;  Location: [place];  Service: ENT;  Laterality: N/A;  . SKIN BIOPSY Right 12/2012  legs  . TONSILLECTOMY  1949  . TUBAL LIGATION  1977    Family History  Problem Relation Age of Onset  . Arrhythmia Mother   . Colon cancer Mother 10  . Cancer Mother   . Tremor Mother   . Arrhythmia Maternal Grandmother   . Glaucoma Maternal Grandmother   . Breast cancer Sister   . Cancer Sister        breast  . Glaucoma Sister   . Heart disease Father 2  . Arthritis Father        broken back with chronic pain  . Glaucoma Father   . Heart disease Brother   . Arrhythmia Brother   . Tremor Brother   . Hypertension Daughter   . Arrhythmia Maternal Uncle   . Heart disease Maternal Uncle   . Colon polyps Maternal Aunt     Allergies  Allergen Reactions  . Medrol [Methylprednisolone] Swelling     Facial and lip swelling  . Lisinopril Rash  . Losartan Anxiety  . Penicillins Rash  . Sulfonamide Derivatives Rash  . Timolol Rash    Current Outpatient Medications on File Prior to Visit  Medication Sig Dispense Refill  . acetaminophen (TYLENOL) 325 MG tablet Take 325 mg by mouth daily as needed for pain.    Marland Kitchen aspirin EC 81 MG tablet Take 81 mg by mouth daily.      . Calcium Carb-Cholecalciferol (CALCIUM-VITAMIN D) 500-200 MG-UNIT tablet Take 1 tablet by mouth daily.    . clindamycin (CLEOCIN) 150 MG capsule Take by mouth 3 (three) times daily.    . hydrochlorothiazide (HYDRODIURIL) 25 MG tablet TAKE 1/2 TABLET BY MOUTH THREE TIMES PER WEEK. 19 tablet 0  . LUMIGAN 0.01 % SOLN Place 1 drop into both eyes at bedtime.  0  . Multiple Vitamins-Minerals (HAIR/SKIN/NAILS/BIOTIN PO) Take 500 mg by mouth 3 (three) times daily.    . potassium chloride SA (K-DUR,KLOR-CON) 20 MEQ tablet Take 20 mEq by mouth 3 (three) times a week. (Take with hydrochlorothiazide)    . potassium chloride SA (K-DUR,KLOR-CON) 20 MEQ tablet TAKE 1 TABLET BY MOUTH DAILY 90 tablet 0  . TRAVATAN Z 0.004 % SOLN ophthalmic solution Place 2 drops into both eyes at bedtime.      No current facility-administered medications on file prior to visit.     BP 124/72 (BP Location: Right Arm, Patient Position: Sitting, Cuff Size: Normal)   Pulse 88   Resp 16   Ht 5\' 7"  (1.702 m)   Wt 131 lb 3.2 oz (59.5 kg)   SpO2 100%   BMI 20.55 kg/m       Objective:   Physical Exam  General- No acute distress. Pleasant patient. Neck- Full range of motion, no jvd Lungs- Clear, even and unlabored. Heart- regular rate and rhythm. Neurologic- CNII- XII grossly intact.  Left lower ext- mid tibia area 5 mm in diameter skin lesion. Appearance of scab in middle with some surrounding redness.      Assessment & Plan:  For your skin lesion not healing over past 3 weeks will refer you to dermatologist. I thought initially maybe skin  infection but your lack of response to clindamycin makes me think cause is not infectious. Also on exam does not look like infection.  Will write the referral and want you to call us by Tuesday next week if no one has contacted you.Silvestre Moment is contact person in our office that dose referral)  Follow up date to be determined here.  Depends on derm appoitment.  Mackie Pai, PA-C

## 2017-04-06 NOTE — Addendum Note (Signed)
Addended by: Anabel Halon on: 04/06/2017 09:41 AM   Modules accepted: Orders

## 2017-04-13 DIAGNOSIS — C44729 Squamous cell carcinoma of skin of left lower limb, including hip: Secondary | ICD-10-CM | POA: Diagnosis not present

## 2017-04-13 DIAGNOSIS — D485 Neoplasm of uncertain behavior of skin: Secondary | ICD-10-CM | POA: Diagnosis not present

## 2017-04-20 DIAGNOSIS — H401122 Primary open-angle glaucoma, left eye, moderate stage: Secondary | ICD-10-CM | POA: Diagnosis not present

## 2017-04-20 DIAGNOSIS — H532 Diplopia: Secondary | ICD-10-CM | POA: Diagnosis not present

## 2017-04-20 DIAGNOSIS — H04123 Dry eye syndrome of bilateral lacrimal glands: Secondary | ICD-10-CM | POA: Diagnosis not present

## 2017-05-14 DIAGNOSIS — C44729 Squamous cell carcinoma of skin of left lower limb, including hip: Secondary | ICD-10-CM | POA: Diagnosis not present

## 2017-05-14 DIAGNOSIS — Z85828 Personal history of other malignant neoplasm of skin: Secondary | ICD-10-CM | POA: Diagnosis not present

## 2017-05-18 DIAGNOSIS — H40051 Ocular hypertension, right eye: Secondary | ICD-10-CM | POA: Diagnosis not present

## 2017-05-18 DIAGNOSIS — H401122 Primary open-angle glaucoma, left eye, moderate stage: Secondary | ICD-10-CM | POA: Diagnosis not present

## 2017-05-18 DIAGNOSIS — H16103 Unspecified superficial keratitis, bilateral: Secondary | ICD-10-CM | POA: Diagnosis not present

## 2017-05-18 DIAGNOSIS — H04123 Dry eye syndrome of bilateral lacrimal glands: Secondary | ICD-10-CM | POA: Diagnosis not present

## 2017-06-04 ENCOUNTER — Other Ambulatory Visit: Payer: Self-pay | Admitting: Family Medicine

## 2017-06-27 NOTE — Progress Notes (Deleted)
Subjective:   Suzanne Turner is a 72 y.o. female who presents for Medicare Annual (Subsequent) preventive examination.  Review of Systems: No ROS.  Medicare Wellness Visit. Additional risk factors are reflected in the social history.    Sleep patterns:   Home Safety/Smoke Alarms: Feels safe in home. Smoke alarms in place.  Living environment; residence and Firearm Safety:    Female:        Mammo-utd       Dexa scan-utd        CCS- due 08/17/18    Objective:     Vitals: There were no vitals taken for this visit.  There is no height or weight on file to calculate BMI.  Advanced Directives 06/29/2016 08/01/2013 11/12/2012 05/15/2012 05/03/2012  Does Patient Have a Medical Advance Directive? Yes Patient has advance directive, copy not in chart Patient has advance directive, copy not in chart Patient has advance directive, copy not in chart Patient has advance directive, copy not in chart  Type of Advance Directive [place];Living will Living will;Healthcare Power of Attorney Living will Living will;Healthcare Power of [place] in Chart? No - copy requested - - - -  Pre-existing out of facility DNR order (yellow form or pink MOST form) - - - No -    Tobacco Social History   Tobacco Use  Smoking Status Current Every Day Smoker  . Packs/day: 0.50  . Years: 40.00  . Pack years: 20.00  . Types: Cigarettes  Smokeless Tobacco Never Used     Ready to quit: Not Answered Counseling given: Not Answered   Clinical Intake:                       Past Medical History:  Diagnosis Date  . Abnormal thyroid blood test 01/28/2016  . Bronchitis   . Cataract   . Chronic eczema   . Glaucoma    Left eye  . Glaucoma 07/28/2014   Left eye Dr Shon Hough at [facility]  . H/O measles   . H/O mumps   . High cholesterol    no medications  . History of chicken pox   . Hypercalcemia 08/02/2014  .  Hypertension   . Kidney stone   . 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   Left eye Dr Shon Hough at [facility]   . Osteoarthritis   . Osteopenia   . Preventative health care 01/30/2016  . Salivary duct stone   . Wears glasses    Past Surgical History:  Procedure Laterality Date  . ORIF [place] Right 1966  . REFRACTIVE SURGERY Left March/April 2018  . SALIVARY STONE REMOVAL Right 05/15/2012   Procedure: EXCISION OF RIGHT SUBMANDIBULAR GLAND ;  Surgeon: Izora Gala, MD;  Location: [place];  Service: ENT;  Laterality: Right;  . SALIVARY STONE REMOVAL N/A 11/18/2012   Procedure: INTRAORAL EXCISION SUBMANDIBULAR STONE RIGHT ;  Surgeon: Izora Gala, MD;  Location: [place];  Service: ENT;  Laterality: N/A;  . SKIN BIOPSY Right 12/2012   legs  . TONSILLECTOMY  1949  . TUBAL LIGATION  1977   Family History  Problem Relation Age of Onset  . Arrhythmia Mother   . Colon cancer Mother 71  . Cancer Mother   . Tremor Mother   . Arrhythmia Maternal Grandmother   . Glaucoma Maternal Grandmother   . Breast cancer Sister   . Cancer Sister  breast  . Glaucoma Sister   . Heart disease Father 91  . Arthritis Father        broken back with chronic pain  . Glaucoma Father   . Heart disease Brother   . Arrhythmia Brother   . Tremor Brother   . Hypertension Daughter   . Arrhythmia Maternal Uncle   . Heart disease Maternal Uncle   . Colon polyps Maternal Aunt    Social History   Socioeconomic History  . Marital status: Widowed    Spouse name: Not on file  . Number of children: Not on file  . Years of education: Not on file  . Highest education level: Not on file  Occupational History  . Not on file  Social Needs  . Financial resource strain: Not on file  . Food insecurity:    Worry: Not on file    Inability: Not on file  . Transportation needs:    Medical: Not on file    Non-medical: Not on file  Tobacco Use  . Smoking status:  Current Every Day Smoker    Packs/day: 0.50    Years: 40.00    Pack years: 20.00    Types: Cigarettes  . Smokeless tobacco: Never Used  Substance and Sexual Activity  . Alcohol use: No  . Drug use: No  . 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 and real estate, no dietary restrictions  Lifestyle  . Physical activity:    Days per week: Not on file    Minutes per session: Not on file  . Stress: Not on file  Relationships  . Social connections:    Talks on phone: Not on file    Gets together: Not on file    Attends religious service: Not on file    Active member of club or organization: Not on file    Attends meetings of clubs or organizations: Not on file    Relationship status: Not on file  Other Topics Concern  . Not on file  Social History Narrative  . Not on file    Outpatient Encounter Medications as of 07/03/2017  Medication Sig  . acetaminophen (TYLENOL) 325 MG tablet Take 325 mg by mouth daily as needed for pain.  Marland Kitchen aspirin EC 81 MG tablet Take 81 mg by mouth daily.    . Calcium Carb-Cholecalciferol (CALCIUM-VITAMIN D) 500-200 MG-UNIT tablet Take 1 tablet by mouth daily.  . clindamycin (CLEOCIN) 150 MG capsule Take by mouth 3 (three) times daily.  . hydrochlorothiazide (HYDRODIURIL) 25 MG tablet TAKE 1/2 TABLET BY MOUTH THREE TIMES PER WEEK.  Marland Kitchen LUMIGAN 0.01 % SOLN Place 1 drop into both eyes at bedtime.  . Multiple Vitamins-Minerals (HAIR/SKIN/NAILS/BIOTIN PO) Take 500 mg by mouth 3 (three) times daily.  . potassium chloride SA (K-DUR,KLOR-CON) 20 MEQ tablet Take 20 mEq by mouth 3 (three) times a week. (Take with hydrochlorothiazide)  . potassium chloride SA (K-DUR,KLOR-CON) 20 MEQ tablet TAKE 1 TABLET BY MOUTH DAILY  . TRAVATAN Z 0.004 % SOLN ophthalmic solution Place 2 drops into both eyes at bedtime.    No facility-administered encounter medications on file as of 07/03/2017.     Activities of  Daily Living In your present state of health, do you have any difficulty performing the following activities: 06/29/2016  Hearing? N  Vision? N  Difficulty concentrating or making decisions? N  Walking or climbing stairs? N  Dressing or bathing? N  Doing errands,  shopping? N  Preparing Food and eating ? N  Using the Toilet? N  In the past six months, have you accidently leaked urine? N  Do you have problems with loss of bowel control? N  Managing your Medications? N  Managing your Finances? N  Housekeeping or managing your Housekeeping? N  Some recent data might be hidden    Patient Care Team: Mosie Lukes, MD as PCP - General (Family Medicine) Larey Dresser, MD as Consulting Physician (Cardiology) Shon Hough, MD as Consulting Physician (Ophthalmology) Sherrlyn Hock. Gordy Levan, DDS as Consulting Physician (Dentistry)    Assessment:   This is a routine wellness examination for Suzanne Turner. Physical assessment deferred to PCP.  Exercise Activities and Dietary recommendations   Diet (meal preparation, eat out, water intake, caffeinated beverages, dairy products, fruits and vegetables): {Desc; diets:16563} Breakfast: Lunch:  Dinner:      Goals    None      Fall Risk Fall Risk  03/05/2017 06/29/2016 01/28/2016 04/01/2014 07/23/2013  Falls in the past year? No No No No No  Risk for fall due to : - - - - Impaired vision    Depression Screen PHQ 2/9 Scores 03/05/2017 06/29/2016 01/28/2016 04/01/2014  PHQ - 2 Score 0 0 0 0     Cognitive Function MMSE - Mini Mental State Exam 06/29/2016  Orientation to time 4  Orientation to Place 5  Registration 3  Attention/ Calculation 4  Recall 3  Language- name 2 objects 2  Language- repeat 1  Language- follow 3 step command 3  Language- read & follow direction 1  Write a sentence 1  Copy design 1  Total score 28        Immunization History  Administered Date(s) Administered  . Influenza Split 10/23/2010  . Influenza,inj,Quad PF,6+ Mos  12/11/2012  . Influenza-Unspecified 11/05/2013, 11/16/2014  . Pneumococcal Conjugate-13 06/23/2010, 05/11/2014  . Tdap 04/01/2014  . Zoster 07/24/2006    Screening Tests Health Maintenance  Topic Date Due  . PNA vac Low Risk Adult (2 of 2 - PPSV23) 05/11/2015  . INFLUENZA VACCINE  08/23/2017  . COLONOSCOPY  08/16/2018  . MAMMOGRAM  02/23/2019  . TETANUS/TDAP  03/31/2024  . DEXA SCAN  Completed  . Hepatitis C Screening  Completed       Plan:   ***   I have personally reviewed and noted the following in the patient's chart:   . Medical and social history . Use of alcohol, tobacco or illicit drugs  . Current medications and supplements . Functional ability and status . Nutritional status . Physical activity . Advanced directives . List of other physicians . Hospitalizations, surgeries, and ER visits in previous 12 months . Vitals . Screenings to include cognitive, depression, and falls . Referrals and appointments  In addition, I have reviewed and discussed with patient certain preventive protocols, quality metrics, and best practice recommendations. A written personalized care plan for preventive services as well as general preventive health recommendations were provided to patient.     Shela Nevin, [place]  06/27/2017

## 2017-07-02 ENCOUNTER — Ambulatory Visit: Payer: Medicare Other | Admitting: *Deleted

## 2017-07-02 NOTE — Progress Notes (Addendum)
Subjective:   Suzanne Turner is a 72 y.o. female who presents for Medicare Annual (Subsequent) preventive examination.  Review of Systems: No ROS.  Medicare Wellness Visit. Additional risk factors are reflected in the social history.  Sleep patterns: Sleeps 8 hrs.  Home Safety/Smoke Alarms: Feels safe in home. Smoke alarms in place.  Living environment; residence and Firearm Safety: Lives alone in [place].    Female:        Mammo-utd       Dexa scan-utd        CCS-due 08/17/18    Objective:     Vitals: BP 120/68 (BP Location: Left Arm, Patient Position: Sitting, Cuff Size: Normal)   Pulse 76   Ht 5\' 7"  (1.702 m)   Wt 126 lb 6.4 oz (57.3 kg)   SpO2 97%   BMI 19.80 kg/m   Body mass index is 19.8 kg/m.  Advanced Directives 07/03/2017 06/29/2016 08/01/2013 11/12/2012 05/15/2012 05/03/2012  Does Patient Have a Medical Advance Directive? Yes Yes Patient has advance directive, copy not in chart Patient has advance directive, copy not in chart Patient has advance directive, copy not in chart Patient has advance directive, copy not in chart  Type of Advance Directive [place];Living will [place];Living will Living will;Healthcare Power of Attorney Living will Living will;Healthcare Power of [place] in Chart? No - copy requested No - copy requested - - - -  Pre-existing out of facility DNR order (yellow form or pink MOST form) - - - - No -    Tobacco Social History   Tobacco Use  Smoking Status Current Every Day Smoker  . Packs/day: 0.50  . Years: 40.00  . Pack years: 20.00  . Types: Cigarettes  Smokeless Tobacco Never Used     Ready to quit: No Counseling given: No   Clinical Intake:     Pain : No/denies pain                 Past Medical History:  Diagnosis Date  . Abnormal thyroid blood test 01/28/2016  . Bronchitis   . Cataract   . Chronic eczema   . Glaucoma    Left  eye  . Glaucoma 07/28/2014   [facility] eye Dr Shon [facility]ough [facility]  . H/O measles   . H/O mumps   . High cholesterol    no medications  . History of chicken pox   . Hypercalcemia 08/02/2014  . Hypertension   . Kidney stone   . 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   Left eye Dr Shon [facility]ough at [facility]   . Osteoarthritis   . Osteopenia   . Preventative health care 01/30/2016  . Salivary duct stone   . Wears glasses    Past Surgical History:  Procedure Laterality Date  . ORIF [place] Right 1966  . REFRACTIVE SURGERY Left March/April 2018  . SALIVARY STONE REMOVAL Right 05/15/2012   Procedure: EXCISION OF RIGHT SUBMANDIBULAR GLAND ;  Surgeon: Izora Gala, MD;  Location: [place];  Service: ENT;  Laterality: Right;  . SALIVARY STONE REMOVAL N/A 11/18/2012   Procedure: INTRAORAL EXCISION SUBMANDIBULAR STONE RIGHT ;  Surgeon: Izora Gala, MD;  Location: [place];  Service: ENT;  Laterality: N/A;  . SKIN BIOPSY Right 12/2012   legs  . TONSILLECTOMY  1949  . TUBAL LIGATION  1977   Family History  Problem Relation Age  of Onset  . Arrhythmia Mother   . Colon cancer Mother 42  . Cancer Mother   . Tremor Mother   . Arrhythmia Maternal Grandmother   . Glaucoma Maternal Grandmother   . Breast cancer Sister   . Cancer Sister        breast  . Glaucoma Sister   . Heart disease Father 60  . Arthritis Father        broken back with chronic pain  . Glaucoma Father   . Heart disease Brother   . Arrhythmia Brother   . Tremor Brother   . Hypertension Daughter   . Arrhythmia Maternal Uncle   . Heart disease Maternal Uncle   . Colon polyps Maternal Aunt    Social History   Socioeconomic History  . Marital status: Widowed    Spouse name: Not on file  . Number of children: Not on file  . Years of education: Not on file  . Highest education level: Not on file  Occupational History  . Not on file  Social Needs  . Financial  resource strain: Not on file  . Food insecurity:    Worry: Not on file    Inability: Not on file  . Transportation needs:    Medical: Not on file    Non-medical: Not on file  Tobacco Use  . Smoking status: Current Every Day Smoker    Packs/day: 0.50    Years: 40.00    Pack years: 20.00    Types: Cigarettes  . Smokeless tobacco: Never Used  Substance and Sexual Activity  . Alcohol use: No  . Drug use: No  . 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 and real estate, no dietary restrictions  Lifestyle  . Physical activity:    Days per week: Not on file    Minutes per session: Not on file  . Stress: Not on file  Relationships  . Social connections:    Talks on phone: Not on file    Gets together: Not on file    Attends religious service: Not on file    Active member of club or organization: Not on file    Attends meetings of clubs or organizations: Not on file    Relationship status: Not on file  Other Topics Concern  . Not on file  Social History Narrative  . Not on file    Outpatient Encounter Medications as of 07/03/2017  Medication Sig  . acetaminophen (TYLENOL) 325 MG tablet Take 325 mg by mouth daily as needed for pain.  Marland Kitchen aspirin EC 81 MG tablet Take 81 mg by mouth daily.    . Calcium Carb-Cholecalciferol (CALCIUM-VITAMIN D) 500-200 MG-UNIT tablet Take 1 tablet by mouth daily.  . hydrochlorothiazide (HYDRODIURIL) 25 MG tablet TAKE 1/2 TABLET BY MOUTH THREE TIMES PER WEEK.  . Latanoprostene Bunod (VYZULTA) 0.024 % SOLN Apply to eye.  Marland Kitchen LUMIGAN 0.01 % SOLN Place 1 drop into both eyes at bedtime.  . Multiple Vitamins-Minerals (HAIR/SKIN/NAILS/BIOTIN PO) Take 500 mg by mouth 3 (three) times daily.  . potassium chloride SA (K-DUR,KLOR-CON) 20 MEQ tablet Take 20 mEq by mouth 3 (three) times a week. (Take with hydrochlorothiazide)  . TRAVATAN Z 0.004 % SOLN ophthalmic solution Place 2 drops into both eyes at  bedtime.   . [DISCONTINUED] clindamycin (CLEOCIN) 150 MG capsule Take by mouth 3 (three) times daily.  . [DISCONTINUED] potassium chloride SA (K-DUR,KLOR-CON) 20 MEQ tablet TAKE 1 TABLET  BY MOUTH DAILY   No facility-administered encounter medications on file as of 07/03/2017.     Activities of Daily Living In your present state of health, do you have any difficulty performing the following activities: 07/03/2017  Hearing? N  Vision? N  Comment Dr.Stoneburner every 6 weeks for glaucoma.   Difficulty concentrating or making decisions? N  Walking or climbing stairs? N  Dressing or bathing? N  Doing errands, shopping? N  Preparing Food and eating ? N  Using the Toilet? N  In the past six months, have you accidently leaked urine? N  Do you have problems with loss of bowel control? N  Managing your Medications? N  Managing your Finances? N  Housekeeping or managing your Housekeeping? N  Some recent data might be hidden    Patient Care Team: Mosie Lukes, MD as PCP - General (Family Medicine) Larey Dresser, MD as Consulting Physician (Cardiology) Shon Hough, MD as Consulting Physician (Ophthalmology) Sherrlyn Hock. Gordy Levan, DDS as Consulting Physician (Dentistry)    Assessment:   This is a routine wellness examination for Suzanne Turner. Physical assessment deferred to PCP.  Exercise Activities and Dietary recommendations Current Exercise Habits: Structured exercise class, Type of exercise: yoga;calisthenics, Time (Minutes): 60, Frequency (Times/Week): 4, Weekly Exercise (Minutes/Week): 240, Intensity: Mild, Exercise limited by: None identified Diet (meal preparation, eat out, water intake, caffeinated beverages, dairy products, fruits and vegetables): well balanced   Goals    . Maintain healthy active lifestyle.       Fall Risk Fall Risk  07/03/2017 03/05/2017 06/29/2016 01/28/2016 04/01/2014  Falls in the past year? No No No No No  Risk for fall due to : - - - - -    Depression  Screen PHQ 2/9 Scores 07/03/2017 03/05/2017 06/29/2016 01/28/2016  PHQ - 2 Score 0 0 0 0     Cognitive Function Ad8 score reviewed for issues:  Issues making decisions:no  Less interest in hobbies / activities:no  Repeats questions, stories (family complaining):no  Trouble using ordinary gadgets (microwave, computer, phone):no  Forgets the month or year: no  Mismanaging finances: no  Remembering appts:no  Daily problems with thinking and/or memory:no Ad8 score is=0    MMSE - Mini Mental State Exam 06/29/2016  Orientation to time 4  Orientation to Place 5  Registration 3  Attention/ Calculation 4  Recall 3  Language- name 2 objects 2  Language- repeat 1  Language- follow 3 step command 3  Language- read & follow direction 1  Write a sentence 1  Copy design 1  Total score 28        Immunization History  Administered Date(s) Administered  . Influenza Split 10/23/2010  . Influenza,inj,Quad PF,6+ Mos 12/11/2012  . Influenza-Unspecified 11/05/2013, 11/16/2014  . Pneumococcal Conjugate-13 06/23/2010, 05/11/2014  . Tdap 04/01/2014  . Zoster 07/24/2006    Screening Tests Health Maintenance  Topic Date Due  . PNA vac Low Risk Adult (2 of 2 - PPSV23) 05/11/2015  . INFLUENZA VACCINE  08/23/2017  . COLONOSCOPY  08/16/2018  . MAMMOGRAM  02/23/2019  . TETANUS/TDAP  03/31/2024  . DEXA SCAN  Completed  . Hepatitis C Screening  Completed       Plan:    Please schedule your next medicare wellness visit with me in 1 yr.  Continue to eat heart healthy diet (full of fruits, vegetables, whole grains, lean protein, water--limit salt, fat, and sugar intake) and increase physical activity as tolerated.  Continue doing brain stimulating activities (puzzles, reading, adult  coloring books, staying active) to keep memory sharp.   Bring a copy of your living will and/or healthcare power of attorney to your next office visit.  Please remember to do your pneumonia vaccine and  Shingrix at your pharmacy as discussed.   I have personally reviewed and noted the following in the patient's chart:   . Medical and social history . Use of alcohol, tobacco or illicit drugs  . Current medications and supplements . Functional ability and status . Nutritional status . Physical activity . Advanced directives . List of other physicians . Hospitalizations, surgeries, and ER visits in previous 12 months . Vitals . Screenings to include cognitive, depression, and falls . Referrals and appointments  In addition, I have reviewed and discussed with patient certain preventive protocols, quality metrics, and best practice recommendations. A written personalized care plan for preventive services as well as general preventive health recommendations were provided to patient.     Shela Nevin, [place]  07/03/2017   Medical screening examination/treatment was performed by qualified clinical staff member and as supervising physician I was immediately available for consultation/collaboration. I have reviewed documentation and agree with assessment and plan.  Penni Homans, MD

## 2017-07-03 ENCOUNTER — Ambulatory Visit: Payer: Medicare Other | Admitting: *Deleted

## 2017-07-03 ENCOUNTER — Encounter: Payer: Self-pay | Admitting: *Deleted

## 2017-07-03 ENCOUNTER — Ambulatory Visit (INDEPENDENT_AMBULATORY_CARE_PROVIDER_SITE_OTHER): Payer: Medicare Other | Admitting: *Deleted

## 2017-07-03 VITALS — BP 120/68 | HR 76 | Ht 67.0 in | Wt 126.4 lb

## 2017-07-03 DIAGNOSIS — Z0000 Encounter for general adult medical examination without abnormal findings: Secondary | ICD-10-CM

## 2017-07-03 NOTE — Patient Instructions (Signed)
Please schedule your next medicare wellness visit with me in 1 yr.  Continue to eat heart healthy diet (full of fruits, vegetables, whole grains, lean protein, water--limit salt, fat, and sugar intake) and increase physical activity as tolerated.  Continue doing brain stimulating activities (puzzles, reading, adult coloring books, staying active) to keep memory sharp.   Bring a copy of your living will and/or healthcare power of attorney to your next office visit.  Please remember to do your pneumonia vaccine and Shingrix at your pharmacy as discussed.    Suzanne Turner , Thank you for taking time to come for your Medicare Wellness Visit. I appreciate your ongoing commitment to your health goals. Please review the following plan we discussed and let me know if I can assist you in the future.   These are the goals we discussed: Goals    . Maintain healthy active lifestyle.       This is a list of the screening recommended for you and due dates:  Health Maintenance  Topic Date Due  . Pneumonia vaccines (2 of 2 - PPSV23) 05/11/2015  . Flu Shot  08/23/2017  . Colon Cancer Screening  08/16/2018  . Mammogram  02/23/2019  . Tetanus Vaccine  03/31/2024  . DEXA scan (bone density measurement)  Completed  .  Hepatitis C: One time screening is recommended by Center for Disease Control  (CDC) for  adults born from [birth year] through 1965.   Completed    Health Maintenance for Postmenopausal Women Menopause is a normal process in which your reproductive ability comes to an end. This process happens gradually over a span of months to years, usually between the ages of 5 and 67. Menopause is complete when you have missed 12 consecutive menstrual periods. It is important to talk with your health care provider about some of the most common conditions that affect postmenopausal women, such as heart disease, cancer, and bone loss (osteoporosis). Adopting a healthy lifestyle and getting preventive care can help  to promote your health and wellness. Those actions can also lower your chances of developing some of these common conditions. What should I know about menopause? During menopause, you may experience a number of symptoms, such as:  Moderate-to-severe hot flashes.  Night sweats.  Decrease in sex drive.  Mood swings.  Headaches.  Tiredness.  Irritability.  Memory problems.  Insomnia.  Choosing to treat or not to treat menopausal changes is an individual decision that you make with your health care provider. What should I know about hormone replacement therapy and supplements? Hormone therapy products are effective for treating symptoms that are associated with menopause, such as hot flashes and night sweats. Hormone replacement carries certain risks, especially as you become older. If you are thinking about using estrogen or estrogen with progestin treatments, discuss the benefits and risks with your health care provider. What should I know about heart disease and stroke? Heart disease, heart attack, and stroke become more likely as you age. This may be due, in part, to the hormonal changes that your body experiences during menopause. These can affect how your body processes dietary fats, triglycerides, and cholesterol. Heart attack and stroke are both medical emergencies. There are many things that you can do to help prevent heart disease and stroke:  Have your blood pressure checked at least every 1-2 years. High blood pressure causes heart disease and increases the risk of stroke.  If you are 50-80 years old, ask your health care provider if you should  take aspirin to prevent a heart attack or a stroke.  Do not use any tobacco products, including cigarettes, chewing tobacco, or electronic cigarettes. If you need help quitting, ask your health care provider.  It is important to eat a healthy diet and maintain a healthy weight. ? Be sure to include plenty of vegetables, fruits,  low-fat dairy products, and lean protein. ? Avoid eating foods that are high in solid fats, added sugars, or salt (sodium).  Get regular exercise. This is one of the most important things that you can do for your health. ? Try to exercise for at least 150 minutes each week. The type of exercise that you do should increase your heart rate and make you sweat. This is known as moderate-intensity exercise. ? Try to do strengthening exercises at least twice each week. Do these in addition to the moderate-intensity exercise.  Know your numbers.Ask your health care provider to check your cholesterol and your blood glucose. Continue to have your blood tested as directed by your health care provider.  What should I know about cancer screening? There are several types of cancer. Take the following steps to reduce your risk and to catch any cancer development as early as possible. Breast Cancer  Practice breast self-awareness. ? This means understanding how your breasts normally appear and feel. ? It also means doing regular breast self-exams. Let your health care provider know about any changes, no matter how small.  If you are 26 or older, have a clinician do a breast exam (clinical breast exam or CBE) every year. Depending on your age, family history, and medical history, it may be recommended that you also have a yearly breast X-ray (mammogram).  If you have a family history of breast cancer, talk with your health care provider about genetic screening.  If you are at high risk for breast cancer, talk with your health care provider about having an MRI and a mammogram every year.  Breast cancer (BRCA) gene test is recommended for women who have family members with BRCA-related cancers. Results of the assessment will determine the need for genetic counseling and BRCA1 and for BRCA2 testing. BRCA-related cancers include these types: ? Breast. This occurs in males or females. ? Ovarian. ? Tubal. This  may also be called fallopian tube cancer. ? Cancer of the abdominal or pelvic lining (peritoneal cancer). ? Prostate. ? Pancreatic.  Cervical, Uterine, and Ovarian Cancer Your health care provider may recommend that you be screened regularly for cancer of the pelvic organs. These include your ovaries, uterus, and vagina. This screening involves a pelvic exam, which includes checking for microscopic changes to the surface of your cervix (Pap test).  For women ages 21-65, health care providers may recommend a pelvic exam and a Pap test every three years. For women ages 72-65, they may recommend the Pap test and pelvic exam, combined with testing for human papilloma virus (HPV), every five years. Some types of HPV increase your risk of cervical cancer. Testing for HPV may also be done on women of any age who have unclear Pap test results.  Other health care providers may not recommend any screening for nonpregnant women who are considered low risk for pelvic cancer and have no symptoms. Ask your health care provider if a screening pelvic exam is right for you.  If you have had past treatment for cervical cancer or a condition that could lead to cancer, you need Pap tests and screening for cancer for at least  20 years after your treatment. If Pap tests have been discontinued for you, your risk factors (such as having a new sexual partner) need to be reassessed to determine if you should start having screenings again. Some women have medical problems that increase the chance of getting cervical cancer. In these cases, your health care provider may recommend that you have screening and Pap tests more often.  If you have a family history of uterine cancer or ovarian cancer, talk with your health care provider about genetic screening.  If you have vaginal bleeding after reaching menopause, tell your health care provider.  There are currently no reliable tests available to screen for ovarian cancer.  Lung  Cancer Lung cancer screening is recommended for adults 26-38 years old who are at high risk for lung cancer because of a history of smoking. A yearly low-dose CT scan of the lungs is recommended if you:  Currently smoke.  Have a history of at least 30 pack-years of smoking and you currently smoke or have quit within the past 15 years. A pack-year is smoking an average of one pack of cigarettes per day for one year.  Yearly screening should:  Continue until it has been 15 years since you quit.  Stop if you develop a health problem that would prevent you from having lung cancer treatment.  Colorectal Cancer  This type of cancer can be detected and can often be prevented.  Routine colorectal cancer screening usually begins at age 69 and continues through age 68.  If you have risk factors for colon cancer, your health care provider may recommend that you be screened at an earlier age.  If you have a family history of colorectal cancer, talk with your health care provider about genetic screening.  Your health care provider may also recommend using home test kits to check for hidden blood in your stool.  A small camera at the end of a tube can be used to examine your colon directly (sigmoidoscopy or colonoscopy). This is done to check for the earliest forms of colorectal cancer.  Direct examination of the colon should be repeated every 5-10 years until age 23. However, if early forms of precancerous polyps or small growths are found or if you have a family history or genetic risk for colorectal cancer, you may need to be screened more often.  Skin Cancer  Check your skin from head to toe regularly.  Monitor any moles. Be sure to tell your health care provider: ? About any new moles or changes in moles, especially if there is a change in a mole's shape or color. ? If you have a mole that is larger than the size of a pencil eraser.  If any of your family members has a history of skin  cancer, especially at a young age, talk with your health care provider about genetic screening.  Always use sunscreen. Apply sunscreen liberally and repeatedly throughout the day.  Whenever you are outside, protect yourself by wearing long sleeves, pants, a wide-brimmed hat, and sunglasses.  What should I know about osteoporosis? Osteoporosis is a condition in which bone destruction happens more quickly than new bone creation. After menopause, you may be at an increased risk for osteoporosis. To help prevent osteoporosis or the bone fractures that can happen because of osteoporosis, the following is recommended:  If you are 58-45 years old, get at least 1,000 mg of calcium and at least 600 mg of vitamin D per day.  If you  are older than age 21 but younger than age 5, get at least 1,200 mg of calcium and at least 600 mg of vitamin D per day.  If you are older than age 4, get at least 1,200 mg of calcium and at least 800 mg of vitamin D per day.  Smoking and excessive alcohol intake increase the risk of osteoporosis. Eat foods that are rich in calcium and vitamin D, and do weight-bearing exercises several times each week as directed by your health care provider. What should I know about how menopause affects my mental health? Depression may occur at any age, but it is more common as you become older. Common symptoms of depression include:  Low or sad mood.  Changes in sleep patterns.  Changes in appetite or eating patterns.  Feeling an overall lack of motivation or enjoyment of activities that you previously enjoyed.  Frequent crying spells.  Talk with your health care provider if you think that you are experiencing depression. What should I know about immunizations? It is important that you get and maintain your immunizations. These include:  Tetanus, diphtheria, and pertussis (Tdap) booster vaccine.  Influenza every year before the flu season begins.  Pneumonia  vaccine.  Shingles vaccine.  Your health care provider may also recommend other immunizations. This information is not intended to replace advice given to you by your health care provider. Make sure you discuss any questions you have with your health care provider. Document Released: 03/03/2005 Document Revised: 07/30/2015 Document Reviewed: 10/13/2014 Elsevier Interactive Patient Education  2018 [employer].

## 2017-07-20 DIAGNOSIS — H40051 Ocular hypertension, right eye: Secondary | ICD-10-CM | POA: Diagnosis not present

## 2017-07-20 DIAGNOSIS — H04123 Dry eye syndrome of bilateral lacrimal glands: Secondary | ICD-10-CM | POA: Diagnosis not present

## 2017-07-20 DIAGNOSIS — H401122 Primary open-angle glaucoma, left eye, moderate stage: Secondary | ICD-10-CM | POA: Diagnosis not present

## 2017-07-20 DIAGNOSIS — H16103 Unspecified superficial keratitis, bilateral: Secondary | ICD-10-CM | POA: Diagnosis not present

## 2017-09-03 ENCOUNTER — Ambulatory Visit (INDEPENDENT_AMBULATORY_CARE_PROVIDER_SITE_OTHER): Payer: Medicare Other | Admitting: Family Medicine

## 2017-09-03 ENCOUNTER — Ambulatory Visit: Payer: Medicare Other | Admitting: Family Medicine

## 2017-09-03 VITALS — BP 152/82 | HR 68 | Temp 97.9°F | Resp 18 | Ht 67.0 in | Wt 123.0 lb

## 2017-09-03 DIAGNOSIS — E559 Vitamin D deficiency, unspecified: Secondary | ICD-10-CM | POA: Diagnosis not present

## 2017-09-03 DIAGNOSIS — I10 Essential (primary) hypertension: Secondary | ICD-10-CM

## 2017-09-03 DIAGNOSIS — D508 Other iron deficiency anemias: Secondary | ICD-10-CM

## 2017-09-03 DIAGNOSIS — E782 Mixed hyperlipidemia: Secondary | ICD-10-CM | POA: Diagnosis not present

## 2017-09-03 DIAGNOSIS — R739 Hyperglycemia, unspecified: Secondary | ICD-10-CM

## 2017-09-03 DIAGNOSIS — C44729 Squamous cell carcinoma of skin of left lower limb, including hip: Secondary | ICD-10-CM | POA: Diagnosis not present

## 2017-09-03 MED ORDER — HYDROCHLOROTHIAZIDE 25 MG PO TABS: ORAL_TABLET | ORAL | 3 refills | Status: DC

## 2017-09-03 NOTE — Assessment & Plan Note (Signed)
Increase leafy greens, consider increased lean red meat and using cast iron cookware. Continue to monitor, report any concerns 

## 2017-09-03 NOTE — Assessment & Plan Note (Signed)
Supplement and monitor 

## 2017-09-03 NOTE — Patient Instructions (Signed)

## 2017-09-03 NOTE — Progress Notes (Signed)
Subjective:    Patient ID: Suzanne Turner, female    DOB: December 30, 1945, 72 y.o.   MRN: [id number]  No chief complaint on file.   HPI Patient is in today for chronic medical concerns, she feels well. No recent febrile illness or hospitalizations. No polyuria or polydipsia. Is trying to stay active and maintain a heart healthy diet. Denies CP/palp/SOB/HA/congestion/fevers/GI or GU c/o. Taking meds as prescribed  Past Medical History:  Diagnosis Date  . Abnormal thyroid blood test 01/28/2016  . Bronchitis   . Cataract   . Chronic eczema   . Glaucoma    Left eye  . Glaucoma 07/28/2014   Left eye Dr Shon Hough at [facility]  . H/O measles   . H/O mumps   . High cholesterol    no medications  . History of chicken pox   . Hypercalcemia 08/02/2014  . Hypertension   . Kidney stone   . 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 Osteoarthritis   . Osteopenia   . Preventative health care 01/30/2016  . Salivary duct stone   . Wears glasses     Past Surgical History:  Procedure Laterality Date  . ORIF [place] Right 1966  . REFRACTIVE SURGERY Left March/April 2018  . SALIVARY STONE REMOVAL Right 05/15/2012   Procedure: EXCISION OF RIGHT SUBMANDIBULAR GLAND ;  Surgeon: Izora Gala, MD;  Location: [place];  Service: ENT;  Laterality: Right;  . SALIVARY STONE REMOVAL N/A 11/18/2012   Procedure: INTRAORAL EXCISION SUBMANDIBULAR STONE RIGHT ;  Surgeon: Izora Gala, MD;  Location: [place];  Service: ENT;  Laterality: N/A;  . SKIN BIOPSY Right 12/2012   legs  . TONSILLECTOMY  1949  . TUBAL LIGATION  1977    Family History  Problem Relation Age of Onset  . Arrhythmia Mother   . Colon cancer Mother 27  . Cancer Mother   . Tremor Mother   . Arrhythmia Maternal Grandmother   . Glaucoma Maternal Grandmother   . Breast cancer Sister   . Cancer Sister        breast  . Glaucoma Sister   . Heart  disease Father 64  . Arthritis Father        broken back with chronic pain  . Glaucoma Father   . Heart disease Brother   . Arrhythmia Brother   . Tremor Brother   . Hypertension Daughter   . Arrhythmia Maternal Uncle   . Heart disease Maternal Uncle   . Colon polyps Maternal Aunt     Social History   Socioeconomic History  . Marital status: Widowed    Spouse name: Not on file  . Number of children: Not on file  . Years of education: Not on file  . Highest education level: Not on file  Occupational History  . Not on file  Social Needs  . Financial resource strain: Not on file  . Food insecurity:    Worry: Not on file    Inability: Not on file  . Transportation needs:    Medical: Not on file    Non-medical: Not on file  Tobacco Use  . Smoking status: Current Every Day Smoker    Packs/day: 0.50    Years: 40.00    Pack years: 20.00    Types: Cigarettes  . Smokeless tobacco: Never Used  Substance and Sexual Activity  . Alcohol use: No  . Drug use:  No  . 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 and real estate, no dietary restrictions  Lifestyle  . Physical activity:    Days per week: Not on file    Minutes per session: Not on file  . Stress: Not on file  Relationships  . Social connections:    Talks on phone: Not on file    Gets together: Not on file    Attends religious service: Not on file    Active member of club or organization: Not on file    Attends meetings of clubs or organizations: Not on file    Relationship status: Not on file  . Intimate partner violence:    Fear of current or ex partner: Not on file    Emotionally abused: Not on file    Physically abused: Not on file    Forced sexual activity: Not on file  Other Topics Concern  . Not on file  Social History Narrative  . Not on file    Outpatient Medications Prior to Visit  Medication Sig Dispense Refill  . acetaminophen  (TYLENOL) 325 MG tablet Take 325 mg by mouth daily as needed for pain.    Marland Kitchen aspirin EC 81 MG tablet Take 81 mg by mouth daily.      . Calcium Carb-Cholecalciferol (CALCIUM-VITAMIN D) 500-200 MG-UNIT tablet Take 1 tablet by mouth daily.    . Latanoprostene Bunod (VYZULTA) 0.024 % SOLN Apply to eye.    Marland Kitchen LUMIGAN 0.01 % SOLN Place 1 drop into both eyes at bedtime.  0  . Multiple Vitamins-Minerals (HAIR/SKIN/NAILS/BIOTIN PO) Take 500 mg by mouth 3 (three) times daily.    . potassium chloride SA (K-DUR,KLOR-CON) 20 MEQ tablet Take 20 mEq by mouth 3 (three) times a week. (Take with hydrochlorothiazide)    . hydrochlorothiazide (HYDRODIURIL) 25 MG tablet TAKE 1/2 TABLET BY MOUTH THREE TIMES PER WEEK. 19 tablet 0  . TRAVATAN Z 0.004 % SOLN ophthalmic solution Place 2 drops into both eyes at bedtime.      No facility-administered medications prior to visit.     Allergies  Allergen Reactions  . Medrol [Methylprednisolone] Swelling    Facial and lip swelling  . Lisinopril Rash  . Losartan Anxiety  . Penicillins Rash  . Sulfonamide Derivatives Rash  . Timolol Rash    Review of Systems  Constitutional: Negative for fever and malaise/fatigue.  HENT: Negative for congestion.   Eyes: Negative for blurred vision.  Respiratory: Negative for shortness of breath.   Cardiovascular: Negative for chest pain, palpitations and leg swelling.  Gastrointestinal: Negative for abdominal pain, blood in stool and nausea.  Genitourinary: Negative for dysuria and frequency.  Musculoskeletal: Negative for falls.  Skin: Negative for rash.  Neurological: Negative for dizziness, loss of consciousness and headaches.  Endo/Heme/Allergies: Negative for environmental allergies.  Psychiatric/Behavioral: Negative for depression. The patient is not nervous/anxious.        Objective:    Physical Exam  Constitutional: She is oriented to person, place, and time. She appears well-developed and well-nourished. No distress.   HENT:  Head: Normocephalic and atraumatic.  Nose: Nose normal.  Eyes: Right eye exhibits no discharge. Left eye exhibits no discharge.  Neck: Normal range of motion. Neck supple.  Cardiovascular: Normal rate and regular rhythm.  No murmur heard. Pulmonary/Chest: Effort normal and breath sounds normal.  Abdominal: Soft. Bowel sounds are normal. There is no tenderness.  Musculoskeletal: She exhibits no edema.  Neurological:  She is alert and oriented to person, place, and time.  Skin: Skin is warm and dry.  Psychiatric: She has a normal mood and affect.  Nursing note and vitals reviewed.   BP (!) 152/82 (BP Location: Left Arm, Patient Position: Sitting, Cuff Size: Normal)   Pulse 68   Temp 97.9 F (36.6 C) (Oral)   Resp 18   Ht 5\' 7"  (1.702 m)   Wt 123 lb (55.8 kg)   SpO2 94%   BMI 19.26 kg/m  Wt Readings from Last 3 Encounters:  09/03/17 123 lb (55.8 kg)  07/03/17 126 lb 6.4 oz (57.3 kg)  04/06/17 131 lb 3.2 oz (59.5 kg)     Lab Results  Component Value Date   WBC 3.8 03/05/2017   HGB 12.8 03/05/2017   HCT 37.5 03/05/2017   PLT 228 03/05/2017   GLUCOSE 75 03/05/2017   CHOL 192 03/05/2017   TRIG 125.0 03/05/2017   HDL 64.30 03/05/2017   LDLCALC 103 (H) 03/05/2017   ALT 16 03/05/2017   AST 22 03/05/2017   NA 138 03/05/2017   K 3.6 03/05/2017   CL 102 03/05/2017   CREATININE 0.85 03/05/2017   BUN 15 03/05/2017   CO2 28 03/05/2017   TSH 4.25 03/05/2017    Lab Results  Component Value Date   TSH 4.25 03/05/2017   Lab Results  Component Value Date   WBC 3.8 03/05/2017   HGB 12.8 03/05/2017   HCT 37.5 03/05/2017   MCV 91.7 03/05/2017   PLT 228 03/05/2017   Lab Results  Component Value Date   NA 138 03/05/2017   K 3.6 03/05/2017   CO2 28 03/05/2017   GLUCOSE 75 03/05/2017   BUN 15 03/05/2017   CREATININE 0.85 03/05/2017   BILITOT 0.6 03/05/2017   ALKPHOS 47 03/05/2017   AST 22 03/05/2017   ALT 16 03/05/2017   PROT 7.7 03/05/2017   ALBUMIN 4.3  03/05/2017   CALCIUM 9.8 03/05/2017   GFR 69.91 03/05/2017   Lab Results  Component Value Date   CHOL 192 03/05/2017   Lab Results  Component Value Date   HDL 64.30 03/05/2017   Lab Results  Component Value Date   LDLCALC 103 (H) 03/05/2017   Lab Results  Component Value Date   TRIG 125.0 03/05/2017   Lab Results  Component Value Date   CHOLHDL 3 03/05/2017   No results found for: HGBA1C     Assessment & Plan:   Problem List Items Addressed This Visit    Vitamin D deficiency    Supplement and monitor      Essential hypertension    Well controlled with increased exercise at home, she is noting diastolic's below 80 and the systolic 300T. no changes to meds. Encouraged heart healthy diet such as the DASH diet and exercise as tolerated.       Relevant Medications   hydrochlorothiazide (HYDRODIURIL) 25 MG tablet   Hyperlipidemia    Encouraged heart healthy diet, increase exercise, avoid trans fats, consider a krill oil cap daily      Relevant Medications   hydrochlorothiazide (HYDRODIURIL) 25 MG tablet   Other iron deficiency anemias - Primary    Increase leafy greens, consider increased lean red meat and using cast iron cookware. Continue to monitor, report any concerns      SCC (squamous cell carcinoma), leg, left    Was removed by Dr Jarome Matin on her shin and she has a 6 month follow up.  Other Visit Diagnoses    Hyperglycemia          I have discontinued Lannette Donath. Binette's TRAVATAN Z. I am also having her maintain her aspirin EC, Multiple Vitamins-Minerals (HAIR/SKIN/NAILS/BIOTIN PO), acetaminophen, calcium-vitamin D, potassium chloride SA, LUMIGAN, Latanoprostene Bunod, and hydrochlorothiazide.  Meds ordered this encounter  Medications  . hydrochlorothiazide (HYDRODIURIL) 25 MG tablet    Sig: TAKE 1/2 TABLET BY MOUTH THREE TIMES PER WEEK.    Dispense:  19 tablet    Refill:  3     Penni Homans, MD

## 2017-09-03 NOTE — Assessment & Plan Note (Signed)
Encouraged heart healthy diet, increase exercise, avoid trans fats, consider a krill oil cap daily 

## 2017-09-03 NOTE — Assessment & Plan Note (Signed)
Was removed by Dr Jarome Matin on her shin and she has a 6 month follow up.

## 2017-09-03 NOTE — Assessment & Plan Note (Signed)
Well controlled with increased exercise at home, she is noting diastolic's below 80 and the systolic 794T. no changes to meds. Encouraged heart healthy diet such as the DASH diet and exercise as tolerated.

## 2017-09-04 ENCOUNTER — Other Ambulatory Visit: Payer: Self-pay | Admitting: Family Medicine

## 2017-10-22 ENCOUNTER — Other Ambulatory Visit: Payer: Self-pay | Admitting: Family Medicine

## 2017-11-13 DIAGNOSIS — Z23 Encounter for immunization: Secondary | ICD-10-CM | POA: Diagnosis not present

## 2017-11-26 DIAGNOSIS — D692 Other nonthrombocytopenic purpura: Secondary | ICD-10-CM | POA: Diagnosis not present

## 2017-11-26 DIAGNOSIS — Z85828 Personal history of other malignant neoplasm of skin: Secondary | ICD-10-CM | POA: Diagnosis not present

## 2017-11-26 DIAGNOSIS — L821 Other seborrheic keratosis: Secondary | ICD-10-CM | POA: Diagnosis not present

## 2017-11-30 DIAGNOSIS — H04123 Dry eye syndrome of bilateral lacrimal glands: Secondary | ICD-10-CM | POA: Diagnosis not present

## 2017-11-30 DIAGNOSIS — H5000 Unspecified esotropia: Secondary | ICD-10-CM | POA: Diagnosis not present

## 2017-11-30 DIAGNOSIS — H401122 Primary open-angle glaucoma, left eye, moderate stage: Secondary | ICD-10-CM | POA: Diagnosis not present

## 2017-11-30 DIAGNOSIS — H5213 Myopia, bilateral: Secondary | ICD-10-CM | POA: Diagnosis not present

## 2018-01-21 ENCOUNTER — Other Ambulatory Visit: Payer: Self-pay | Admitting: Family Medicine

## 2018-01-21 DIAGNOSIS — Z1231 Encounter for screening mammogram for malignant neoplasm of breast: Secondary | ICD-10-CM

## 2018-02-26 ENCOUNTER — Ambulatory Visit
Admission: RE | Admit: 2018-02-26 | Discharge: 2018-02-26 | Disposition: A | Discharge status: Home / Self Care | Payer: Medicare Other | Source: Ambulatory Visit | Attending: Family Medicine | Admitting: Family Medicine

## 2018-02-26 DIAGNOSIS — Z1231 Encounter for screening mammogram for malignant neoplasm of breast: Secondary | ICD-10-CM

## 2018-02-28 ENCOUNTER — Ambulatory Visit (INDEPENDENT_AMBULATORY_CARE_PROVIDER_SITE_OTHER): Payer: Medicare Other | Admitting: Family Medicine

## 2018-02-28 ENCOUNTER — Encounter: Payer: Self-pay | Admitting: Family Medicine

## 2018-02-28 VITALS — BP 136/78 | HR 74 | Temp 97.9°F | Resp 18 | Wt 122.4 lb

## 2018-02-28 DIAGNOSIS — E559 Vitamin D deficiency, unspecified: Secondary | ICD-10-CM | POA: Diagnosis not present

## 2018-02-28 DIAGNOSIS — E871 Hypo-osmolality and hyponatremia: Secondary | ICD-10-CM | POA: Diagnosis not present

## 2018-02-28 DIAGNOSIS — I10 Essential (primary) hypertension: Secondary | ICD-10-CM | POA: Diagnosis not present

## 2018-02-28 DIAGNOSIS — R1031 Right lower quadrant pain: Secondary | ICD-10-CM

## 2018-02-28 DIAGNOSIS — E782 Mixed hyperlipidemia: Secondary | ICD-10-CM

## 2018-02-28 DIAGNOSIS — J069 Acute upper respiratory infection, unspecified: Secondary | ICD-10-CM | POA: Diagnosis not present

## 2018-02-28 LAB — URINALYSIS, ROUTINE W REFLEX MICROSCOPIC
Bilirubin Urine: NEGATIVE
Hgb urine dipstick: NEGATIVE
Ketones, ur: NEGATIVE
Nitrite: NEGATIVE
RBC / HPF: NONE SEEN (ref 0–?)
Specific Gravity, Urine: 1.01 (ref 1.000–1.030)
Total Protein, Urine: NEGATIVE
Urine Glucose: NEGATIVE
Urobilinogen, UA: 0.2 (ref 0.0–1.0)
pH: 7 (ref 5.0–8.0)

## 2018-02-28 LAB — COMPREHENSIVE METABOLIC PANEL
ALT: 14 U/L (ref 0–35)
AST: 19 U/L (ref 0–37)
Albumin: 4.1 g/dL (ref 3.5–5.2)
Alkaline Phosphatase: 76 U/L (ref 39–117)
BUN: 16 mg/dL (ref 6–23)
CO2: 29 mEq/L (ref 19–32)
Calcium: 9.4 mg/dL (ref 8.4–10.5)
Chloride: 96 mEq/L (ref 96–112)
Creatinine, Ser: 0.78 mg/dL (ref 0.40–1.20)
GFR: 72.43 mL/min (ref >60.00)
Glucose, Bld: 69 mg/dL — ABNORMAL LOW (ref 70–99)
Potassium: 4 mEq/L (ref 3.5–5.1)
Sodium: 132 mEq/L — ABNORMAL LOW (ref 135–145)
Total Bilirubin: 0.5 mg/dL (ref 0.2–1.2)
Total Protein: 7.3 g/dL (ref 6.0–8.3)

## 2018-02-28 LAB — CBC WITH DIFFERENTIAL/PLATELET
Basophils Absolute: 0 10*3/uL (ref 0.0–0.1)
Basophils Relative: 0.8 % (ref 0.0–3.0)
Eosinophils Absolute: 0 10*3/uL (ref 0.0–0.7)
Eosinophils Relative: 1.1 % (ref 0.0–5.0)
HCT: 36.9 % (ref 36.0–46.0)
Hemoglobin: 12.4 g/dL (ref 12.0–15.0)
Lymphocytes Relative: 10.3 % — ABNORMAL LOW (ref 12.0–46.0)
Lymphs Abs: 0.4 10*3/uL — ABNORMAL LOW (ref 0.7–4.0)
MCHC: 33.7 g/dL (ref 30.0–36.0)
MCV: 92.8 fl (ref 78.0–100.0)
Monocytes Absolute: 0.4 10*3/uL (ref 0.1–1.0)
Monocytes Relative: 10.5 % (ref 3.0–12.0)
Neutro Abs: 3.2 10*3/uL (ref 1.4–7.7)
Neutrophils Relative %: 77.3 % — ABNORMAL HIGH (ref 43.0–77.0)
Platelets: 391 10*3/uL (ref 150.0–400.0)
RBC: 3.98 Mil/uL (ref 3.87–5.11)
RDW: 13.1 % (ref 11.5–15.5)
WBC: 4.1 10*3/uL (ref 4.0–10.5)

## 2018-02-28 LAB — LIPID PANEL
Cholesterol: 166 mg/dL (ref 0–200)
HDL: 52.5 mg/dL (ref >39.00)
LDL Cholesterol: 94 mg/dL (ref 0–99)
NonHDL: 113.46
Total CHOL/HDL Ratio: 3
Triglycerides: 98 mg/dL (ref 0.0–149.0)
VLDL: 19.6 mg/dL (ref 0.0–40.0)

## 2018-02-28 LAB — VITAMIN D 25 HYDROXY (VIT D DEFICIENCY, FRACTURES): VITD: 47.89 ng/mL (ref 30.00–100.00)

## 2018-02-28 LAB — TSH: TSH: 2.91 u[IU]/mL (ref 0.35–4.50)

## 2018-02-28 NOTE — Patient Instructions (Signed)
Encouraged increased rest and hydration, add probiotics, zinc such as Coldeze or Xicam. Treat fevers as needed. Elderberry, vitamin C 500 to 1000 mg daily, Mucinex twice daily Viral Respiratory Infection A respiratory infection is an illness that affects part of the respiratory system, such as the lungs, nose, or throat. A respiratory infection that is caused by a virus is called a viral respiratory infection. Common types of viral respiratory infections include:  A cold.  The flu (influenza).  A respiratory syncytial virus (RSV) infection. What are the causes? This condition is caused by a virus. What are the signs or symptoms? Symptoms of this condition include:  A stuffy or runny nose.  Yellow or green nasal discharge.  A cough.  Sneezing.  Fatigue.  Achy muscles.  A sore throat.  Sweating or chills.  A fever.  A headache. How is this diagnosed? This condition may be diagnosed based on:  Your symptoms.  A physical exam.  Testing of nasal swabs. How is this treated? This condition may be treated with medicines, such as:  Antiviral medicine. This may shorten the length of time a person has symptoms.  Expectorants. These make it easier to cough up mucus.  Decongestant nasal sprays.  Acetaminophen or NSAIDs to relieve fever and pain. Antibiotic medicines are not prescribed for viral infections. This is because antibiotics are designed to kill bacteria. They are not effective against viruses. Follow these instructions at home:  Managing pain and congestion  Take over-the-counter and prescription medicines only as told by your health care provider.  If you have a sore throat, gargle with a salt-water mixture 3-4 times a day or as needed. To make a salt-water mixture, completely dissolve -1 tsp of salt in 1 cup of warm water.  Use nose drops made from salt water to ease congestion and soften raw skin around your nose.  Drink enough fluid to keep your urine  pale yellow. This helps prevent dehydration and helps loosen up mucus. General instructions  Rest as much as possible.  Do not drink alcohol.  Do not use any products that contain nicotine or tobacco, such as cigarettes and e-cigarettes. If you need help quitting, ask your health care provider.  Keep all follow-up visits as told by your health care provider. This is important. How is this prevented?   Get an annual flu shot. You may get the flu shot in late summer, fall, or winter. Ask your health care provider when you should get your flu shot.  Avoid exposing others to your respiratory infection. ? Stay home from work or school as told by your health care provider. ? Wash your hands with soap and water often, especially after you cough or sneeze. If soap and water are not available, use alcohol-based hand sanitizer.  Avoid contact with people who are sick during cold and flu season. This is generally fall and winter. Contact a health care provider if:  Your symptoms last for 10 days or longer.  Your symptoms get worse over time.  You have a fever.  You have severe sinus pain in your face or forehead.  The glands in your jaw or neck become very swollen. Get help right away if you:  Feel pain or pressure in your chest.  Have shortness of breath.  Faint or feel like you will faint.  Have severe and persistent vomiting.  Feel confused or disoriented. Summary  A respiratory infection is an illness that affects part of the respiratory system, such as the  lungs, nose, or throat. A respiratory infection that is caused by a virus is called a viral respiratory infection.  Common types of viral respiratory infections are a cold, influenza, and respiratory syncytial virus (RSV) infection.  Symptoms of this condition include a stuffy or runny nose, cough, sneezing, fatigue, achy muscles, sore throat, and fevers or chills.  Antibiotic medicines are not prescribed for viral  infections. This is because antibiotics are designed to kill bacteria. They are not effective against viruses. This information is not intended to replace advice given to you by your health care provider. Make sure you discuss any questions you have with your health care provider. Document Released: 10/19/2004 Document Revised: 02/19/2017 Document Reviewed: 02/19/2017 Elsevier Interactive Patient Education  2019 [employer].

## 2018-02-28 NOTE — Assessment & Plan Note (Signed)
Well controlled, no changes to meds. Encouraged heart healthy diet such as the DASH diet and exercise as tolerated.  °

## 2018-03-01 LAB — URINE CULTURE
MICRO NUMBER:: 161270
SPECIMEN QUALITY:: ADEQUATE

## 2018-03-03 DIAGNOSIS — R1031 Right lower quadrant pain: Secondary | ICD-10-CM | POA: Insufficient documentation

## 2018-03-03 DIAGNOSIS — J069 Acute upper respiratory infection, unspecified: Secondary | ICD-10-CM | POA: Insufficient documentation

## 2018-03-03 NOTE — Assessment & Plan Note (Signed)
Encouraged increased rest and hydration, add probiotics, zinc such as Coldeze or Xicam. Treat fevers as needed 

## 2018-03-03 NOTE — Assessment & Plan Note (Signed)
With some intermittent diarrhea. No bloody or tarry stool. Improving after struggling with nausea and decreased appetite she seems to be improving. Encouraged bland diet and report if worsens again

## 2018-03-03 NOTE — Assessment & Plan Note (Signed)
Supplement and monitor 

## 2018-03-03 NOTE — Assessment & Plan Note (Signed)
Encouraged heart healthy diet, increase exercise, avoid trans fats, consider a krill oil cap daily 

## 2018-03-03 NOTE — Assessment & Plan Note (Signed)
Asymptomatic and mild, will continue to monitor. Restrict water intake

## 2018-03-03 NOTE — Progress Notes (Signed)
Subjective:    Patient ID: Suzanne Turner, female    DOB: 1945-02-16, 73 y.o.   MRN: [id number]  No chief complaint on file.   HPI Patient is in today for follow-up and she is not feeling well.  She is noting trouble with head congestion and cough this week.  She has had some bloody noses at times as well as sores in her mouth.  That is slowly improving.  She also notes intermittent abdominal pain most notably in the right lower quadrant this is also improving but she did spend 10 days with poor appetite, fatigue and nausea.  No vomiting.  She did endorse a couple of loose stool daily but nothing bloody black or tarry  Past Medical History:  Diagnosis Date  . Abnormal thyroid blood test 01/28/2016  . Bronchitis   . Cataract   . Chronic eczema   . Glaucoma    Left eye  . Glaucoma 07/28/2014   Left eye Dr Shon Hough at [facility]  . H/O measles   . H/O mumps   . High cholesterol    no medications  . History of chicken pox   . Hypercalcemia 08/02/2014  . Hypertension   . Kidney stone   . 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 Osteoarthritis   . Osteopenia   . Preventative health care 01/30/2016  . Salivary duct stone   . Wears glasses     Past Surgical History:  Procedure Laterality Date  . ORIF [place] Right 1966  . REFRACTIVE SURGERY Left March/April 2018  . SALIVARY STONE REMOVAL Right 05/15/2012   Procedure: EXCISION OF RIGHT SUBMANDIBULAR GLAND ;  Surgeon: Izora Gala, MD;  Location: [place];  Service: ENT;  Laterality: Right;  . SALIVARY STONE REMOVAL N/A 11/18/2012   Procedure: INTRAORAL EXCISION SUBMANDIBULAR STONE RIGHT ;  Surgeon: Izora Gala, MD;  Location: [place];  Service: ENT;  Laterality: N/A;  . SKIN BIOPSY Right 12/2012   legs  . TONSILLECTOMY  1949  . TUBAL LIGATION  1977    Family History  Problem Relation Age of Onset  . Arrhythmia Mother   . Colon  cancer Mother 66  . Cancer Mother   . Tremor Mother   . Arrhythmia Maternal Grandmother   . Glaucoma Maternal Grandmother   . Breast cancer Sister   . Cancer Sister        breast  . Glaucoma Sister   . Heart disease Father 41  . Arthritis Father        broken back with chronic pain  . Glaucoma Father   . Heart disease Brother   . Arrhythmia Brother   . Tremor Brother   . Hypertension Daughter   . Arrhythmia Maternal Uncle   . Heart disease Maternal Uncle   . Colon polyps Maternal Aunt     Social History   Socioeconomic History  . Marital status: Widowed    Spouse name: Not on file  . Number of children: Not on file  . Years of education: Not on file  . Highest education level: Not on file  Occupational History  . Not on file  Social Needs  . Financial resource strain: Not on file  . Food insecurity:    Worry: Not on file    Inability: Not on file  . Transportation needs:    Medical: Not on file    Non-medical: Not on  file  Tobacco Use  . Smoking status: Current Every Day Smoker    Packs/day: 0.50    Years: 40.00    Pack years: 20.00    Types: Cigarettes  . Smokeless tobacco: Never Used  Substance and Sexual Activity  . Alcohol use: No  . Drug use: No  . 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 and real estate, no dietary restrictions  Lifestyle  . Physical activity:    Days per week: Not on file    Minutes per session: Not on file  . Stress: Not on file  Relationships  . Social connections:    Talks on phone: Not on file    Gets together: Not on file    Attends religious service: Not on file    Active member of club or organization: Not on file    Attends meetings of clubs or organizations: Not on file    Relationship status: Not on file  . Intimate partner violence:    Fear of current or ex partner: Not on file    Emotionally abused: Not on file    Physically abused: Not on file     Forced sexual activity: Not on file  Other Topics Concern  . Not on file  Social History Narrative  . Not on file    Outpatient Medications Prior to Visit  Medication Sig Dispense Refill  . acetaminophen (TYLENOL) 325 MG tablet Take 325 mg by mouth daily as needed for pain.    Marland Kitchen aspirin EC 81 MG tablet Take 81 mg by mouth daily.      . Calcium Carb-Cholecalciferol (CALCIUM-VITAMIN D) 500-200 MG-UNIT tablet Take 1 tablet by mouth daily.    . hydrochlorothiazide (HYDRODIURIL) 25 MG tablet TAKE 1/2 TABLET BY MOUTH THREE TIMES PER WEEK. 19 tablet 3  . Latanoprostene Bunod (VYZULTA) 0.024 % SOLN Apply to eye.    Marland Kitchen LUMIGAN 0.01 % SOLN Place 1 drop into both eyes at bedtime.  0  . Multiple Vitamins-Minerals (HAIR/SKIN/NAILS/BIOTIN PO) Take 500 mg by mouth 3 (three) times daily.    . potassium chloride SA (K-DUR,KLOR-CON) 20 MEQ tablet TAKE 1 TABLET BY MOUTH DAILY 90 tablet 0   No facility-administered medications prior to visit.     Allergies  Allergen Reactions  . Medrol [Methylprednisolone] Swelling    Facial and lip swelling  . Lisinopril Rash  . Losartan Anxiety  . Penicillins Rash  . Sulfonamide Derivatives Rash  . Timolol Rash    Review of Systems  Constitutional: Negative for fever and malaise/fatigue.  HENT: Positive for congestion.   Eyes: Negative for blurred vision.  Respiratory: Positive for cough and sputum production. Negative for shortness of breath.   Cardiovascular: Negative for chest pain, palpitations and leg swelling.  Gastrointestinal: Positive for abdominal pain, diarrhea and nausea. Negative for blood in stool, constipation and vomiting.  Genitourinary: Negative for dysuria and frequency.  Musculoskeletal: Negative for falls.  Skin: Negative for rash.  Neurological: Negative for dizziness, loss of consciousness and headaches.  Endo/Heme/Allergies: Negative for environmental allergies.  Psychiatric/Behavioral: Negative for depression. The patient is not  nervous/anxious.        Objective:    Physical Exam Vitals signs and nursing note reviewed.  Constitutional:      General: She is not in acute distress.    Appearance: She is well-developed.  HENT:     Head: Normocephalic and atraumatic.     Nose: Nose normal.  Eyes:  General:        Right eye: No discharge.        Left eye: No discharge.  Neck:     Musculoskeletal: Normal range of motion and neck supple.  Cardiovascular:     Rate and Rhythm: Normal rate and regular rhythm.     Heart sounds: No murmur.  Pulmonary:     Effort: Pulmonary effort is normal.     Breath sounds: Normal breath sounds.  Abdominal:     General: Bowel sounds are normal.     Palpations: Abdomen is soft.     Tenderness: There is no abdominal tenderness.  Skin:    General: Skin is warm and dry.  Neurological:     Mental Status: She is alert and oriented to person, place, and time.     BP 136/78 (BP Location: Left Arm, Patient Position: Sitting, Cuff Size: Normal)   Pulse 74   Temp 97.9 F (36.6 C) (Oral)   Resp 18   Wt 122 lb 6.4 oz (55.5 kg)   SpO2 100%   BMI 19.17 kg/m  Wt Readings from Last 3 Encounters:  02/28/18 122 lb 6.4 oz (55.5 kg)  09/03/17 123 lb (55.8 kg)  07/03/17 126 lb 6.4 oz (57.3 kg)     Lab Results  Component Value Date   WBC 4.1 02/28/2018   HGB 12.4 02/28/2018   HCT 36.9 02/28/2018   PLT 391.0 02/28/2018   GLUCOSE 69 (L) 02/28/2018   CHOL 166 02/28/2018   TRIG 98.0 02/28/2018   HDL 52.50 02/28/2018   LDLCALC 94 02/28/2018   ALT 14 02/28/2018   AST 19 02/28/2018   NA 132 (L) 02/28/2018   K 4.0 02/28/2018   CL 96 02/28/2018   CREATININE 0.78 02/28/2018   BUN 16 02/28/2018   CO2 29 02/28/2018   TSH 2.91 02/28/2018    Lab Results  Component Value Date   TSH 2.91 02/28/2018   Lab Results  Component Value Date   WBC 4.1 02/28/2018   HGB 12.4 02/28/2018   HCT 36.9 02/28/2018   MCV 92.8 02/28/2018   PLT 391.0 02/28/2018   Lab Results  Component  Value Date   NA 132 (L) 02/28/2018   K 4.0 02/28/2018   CO2 29 02/28/2018   GLUCOSE 69 (L) 02/28/2018   BUN 16 02/28/2018   CREATININE 0.78 02/28/2018   BILITOT 0.5 02/28/2018   ALKPHOS 76 02/28/2018   AST 19 02/28/2018   ALT 14 02/28/2018   PROT 7.3 02/28/2018   ALBUMIN 4.1 02/28/2018   CALCIUM 9.4 02/28/2018   GFR 72.43 02/28/2018   Lab Results  Component Value Date   CHOL 166 02/28/2018   Lab Results  Component Value Date   HDL 52.50 02/28/2018   Lab Results  Component Value Date   LDLCALC 94 02/28/2018   Lab Results  Component Value Date   TRIG 98.0 02/28/2018   Lab Results  Component Value Date   CHOLHDL 3 02/28/2018   No results found for: HGBA1C     Assessment & Plan:   Problem List Items Addressed This Visit    Vitamin D deficiency - Primary    Supplement and monitor      Relevant Orders   VITAMIN D 25 Hydroxy (Vit-D Deficiency, Fractures) (Completed)   Essential hypertension    Well controlled, no changes to meds. Encouraged heart healthy diet such as the DASH diet and exercise as tolerated.       Relevant Orders  TSH (Completed)   Hyperlipidemia    Encouraged heart healthy diet, increase exercise, avoid trans fats, consider a krill oil cap daily      Relevant Orders   Lipid panel (Completed)   Hyponatremia    Asymptomatic and mild, will continue to monitor. Restrict water intake      Relevant Orders   Comprehensive metabolic panel (Completed)   Abdominal pain, RLQ    With some intermittent diarrhea. No bloody or tarry stool. Improving after struggling with nausea and decreased appetite she seems to be improving. Encouraged bland diet and report if worsens again      Relevant Orders   CBC with Differential/Platelet (Completed)   Urinalysis   Urine Culture (Completed)   URI (upper respiratory infection)    Encouraged increased rest and hydration, add probiotics, zinc such as Coldeze or Xicam. Treat fevers as needed         I am  having Suzanne Turner. Suzanne Turner maintain her aspirin EC, Multiple Vitamins-Minerals (HAIR/SKIN/NAILS/BIOTIN PO), acetaminophen, calcium-vitamin D, LUMIGAN, Latanoprostene Bunod, hydrochlorothiazide, and potassium chloride SA.  No orders of the defined types were placed in this encounter.    Penni Homans, MD

## 2018-03-07 ENCOUNTER — Ambulatory Visit: Payer: Medicare Other | Admitting: Family Medicine

## 2018-03-11 DIAGNOSIS — H04123 Dry eye syndrome of bilateral lacrimal glands: Secondary | ICD-10-CM | POA: Diagnosis not present

## 2018-03-11 DIAGNOSIS — H401122 Primary open-angle glaucoma, left eye, moderate stage: Secondary | ICD-10-CM | POA: Diagnosis not present

## 2018-03-11 DIAGNOSIS — H40051 Ocular hypertension, right eye: Secondary | ICD-10-CM | POA: Diagnosis not present

## 2018-03-11 DIAGNOSIS — H25813 Combined forms of age-related cataract, bilateral: Secondary | ICD-10-CM | POA: Diagnosis not present

## 2018-05-11 ENCOUNTER — Other Ambulatory Visit: Payer: Self-pay | Admitting: Family Medicine

## 2018-05-13 ENCOUNTER — Encounter: Payer: Medicare Other | Admitting: Family Medicine

## 2018-05-30 DIAGNOSIS — Z20828 Contact with and (suspected) exposure to other viral communicable diseases: Secondary | ICD-10-CM | POA: Diagnosis not present

## 2018-07-04 NOTE — Progress Notes (Addendum)
Virtual Visit via Video Note  I connected with patient on 07/05/18 at  1:00 PM EDT by audio enabled telemedicine application and verified that I am speaking with the correct person using two identifiers.   THIS ENCOUNTER IS A VIRTUAL VISIT DUE TO COVID-19 - PATIENT WAS NOT SEEN IN THE OFFICE. PATIENT HAS CONSENTED TO VIRTUAL VISIT / TELEMEDICINE VISIT   Location of patient: home  Location of provider: office  I discussed the limitations of evaluation and management by telemedicine and the availability of in person appointments. The patient expressed understanding and agreed to proceed.   Subjective:   Suzanne Turner is a 73 y.o. female who presents for Medicare Annual (Subsequent) preventive examination.  Review of Systems: No ROS.  Medicare Wellness Virtual Visit.  Visual/audio telehealth visit, UTA vital signs.   See social history for additional risk factors.  Cardiac Risk Factors include: advanced age (>52men, >71 women);dyslipidemia;hypertension;smoking/ tobacco exposure Sleep patterns: no issues. Home Safety/Smoke Alarms: Feels safe in home. Smoke alarms in place.  Lives alone in 2 story condo.  Female:      Mammo- 02/26/18      Dexa scan- 08/15/16       CCS- due 08/17/18     Objective:     Advanced Directives 07/05/2018 07/03/2017 06/29/2016 08/01/2013 11/12/2012 05/15/2012 05/03/2012  Does Patient Have a Medical Advance Directive? Yes Yes Yes Patient has advance directive, copy not in chart Patient has advance directive, copy not in chart Patient has advance directive, copy not in chart Patient has advance directive, copy not in chart  Type of Advance Directive [place];Living will [place];Living will [place];Living will Living will;Healthcare Power of Attorney Living will Living will;Healthcare Power of Attorney -  Does patient want to make changes to medical advance directive? No - Patient declined - - - - - -   Copy of [place] in Chart? Yes - validated most recent copy scanned in chart (See row information) No - copy requested No - copy requested - - - -  Pre-existing out of facility DNR order (yellow form or pink MOST form) - - - - - No -    Tobacco Social History   Tobacco Use  Smoking Status Current Every Day Smoker  . Packs/day: 0.50  . Years: 40.00  . Pack years: 20.00  . Types: Cigarettes  Smokeless Tobacco Never Used     Ready to quit: No Counseling given: No   Clinical Intake: Pain : No/denies pain   Past Medical History:  Diagnosis Date  . Abnormal thyroid blood test 01/28/2016  . Bronchitis   . Cataract   . Chronic eczema   . Glaucoma    Left eye  . Glaucoma 07/28/2014   Left eye Dr Shon Hough [facility]  . H/O measles   . H/O mumps   . High cholesterol    no medications  . History of chicken pox   . Hypercalcemia 08/02/2014  . Hypertension   . Kidney stone   . 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 Osteoarthritis   . Osteopenia   . Preventative health care 01/30/2016  . Salivary duct stone   . Wears glasses    Past Surgical History:  Procedure Laterality Date  . ORIF [place] Right 1966  . REFRACTIVE SURGERY Left March/April 2018  . SALIVARY STONE REMOVAL Right 05/15/2012   Procedure:  EXCISION OF RIGHT SUBMANDIBULAR GLAND ;  Surgeon: Izora Gala, MD;  Location: [facility];  Service: ENT;  Laterality: Right;  . SALIVARY STONE REMOVAL N/A 11/18/2012   Procedure: INTRAORAL EXCISION SUBMANDIBULAR STONE RIGHT ;  Surgeon: Izora Gala, MD;  Location: [facility];  Service: ENT;  Laterality: N/A;  . SKIN BIOPSY Right 12/2012   legs  . TONSILLECTOMY  1949  . TUBAL LIGATION  1977   Family History  Problem Relation Age of Onset  . Arrhythmia Mother   . Colon cancer Mother 30  . Cancer Mother   . Tremor Mother   . Arrhythmia Maternal Grandmother    . Glaucoma Maternal Grandmother   . Breast cancer Sister   . Cancer Sister        breast  . Glaucoma Sister   . Heart disease Father 32  . Arthritis Father        broken back with chronic pain  . Glaucoma Father   . Heart disease Brother   . Arrhythmia Brother   . Tremor Brother   . Hypertension Daughter   . Arrhythmia Maternal Uncle   . Heart disease Maternal Uncle   . Colon polyps Maternal Aunt    Social History   Socioeconomic History  . Marital status: Widowed    Spouse name: Not on file  . Number of children: Not on file  . Years of education: Not on file  . Highest education level: Not on file  Occupational History  . Not on file  Social Needs  . Financial resource strain: Not on file  . Food insecurity    Worry: Not on file    Inability: Not on file  . Transportation needs    Medical: Not on file    Non-medical: Not on file  Tobacco Use  . Smoking status: Current Every Day Smoker    Packs/day: 0.50    Years: 40.00    Pack years: 20.00    Types: Cigarettes  . Smokeless tobacco: Never Used  Substance and Sexual Activity  . Alcohol use: No  . Drug use: No  . 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 and real estate, no dietary restrictions  Lifestyle  . Physical activity    Days per week: Not on file    Minutes per session: Not on file  . Stress: Not on file  Relationships  . Social Herbalist on phone: Not on file    Gets together: Not on file    Attends religious service: Not on file    Active member of club or organization: Not on file    Attends meetings of clubs or organizations: Not on file    Relationship status: Not on file  Other Topics Concern  . Not on file  Social History Narrative  . Not on file    Outpatient Encounter Medications as of 07/05/2018  Medication Sig  . acetaminophen (TYLENOL) 325 MG tablet Take 325 mg by mouth daily as needed for pain.  Marland Kitchen  aspirin EC 81 MG tablet Take 81 mg by mouth daily.    . Calcium Carb-Cholecalciferol (CALCIUM-VITAMIN D) 500-200 MG-UNIT tablet Take 1 tablet by mouth daily.  . hydrochlorothiazide (HYDRODIURIL) 25 MG tablet TAKE 1/2 TABLET BY MOUTH THREE TIMES PER WEEK.  . Latanoprostene Bunod (VYZULTA) 0.024 % SOLN Apply to eye.  . Multiple Vitamins-Minerals (HAIR/SKIN/NAILS/BIOTIN PO) Take 500 mg by mouth 3 (three) times daily.  Marland Kitchen  potassium chloride SA (K-DUR) 20 MEQ tablet TAKE 1 TABLET BY MOUTH DAILY  . LUMIGAN 0.01 % SOLN Place 1 drop into both eyes at bedtime.   No facility-administered encounter medications on file as of 07/05/2018.     Activities of Daily Living In your present state of health, do you have any difficulty performing the following activities: 07/05/2018  Hearing? N  Vision? N  Difficulty concentrating or making decisions? N  Walking or climbing stairs? N  Dressing or bathing? N  Doing errands, shopping? N  Preparing Food and eating ? N  Using the Toilet? N  In the past six months, have you accidently leaked urine? N  Do you have problems with loss of bowel control? N  Managing your Medications? N  Managing your Finances? N  Housekeeping or managing your Housekeeping? N  Some recent data might be hidden    Patient Care Team: Mosie Lukes, MD as PCP - General (Family Medicine) Larey Dresser, MD as Consulting Physician (Cardiology) Shon Hough, MD as Consulting Physician (Ophthalmology) Sherrlyn Hock. Gordy Levan, DDS as Consulting Physician (Dentistry)    Assessment:   This is a routine wellness examination for Madesyn. Physical assessment deferred to PCP.  Exercise Activities and Dietary recommendations Current Exercise Habits: Home exercise routine, Type of exercise: yoga, Time (Minutes): 30, Frequency (Times/Week): 2, Weekly Exercise (Minutes/Week): 60, Exercise limited by: None identified   Diet (meal preparation, eat out, water intake, caffeinated beverages, dairy  products, fruits and vegetables): in general, a "healthy" diet  , well balanced, on average, 2 meals per day  Goals    . Maintain current health status (pt-stated)    . Maintain healthy active lifestyle.       Fall Risk Fall Risk  07/05/2018 07/03/2017 03/05/2017 06/29/2016 01/28/2016  Falls in the past year? 0 No No No No  Risk for fall due to : - - - - -    Depression Screen PHQ 2/9 Scores 07/05/2018 07/03/2017 03/05/2017 06/29/2016  PHQ - 2 Score 0 0 0 0     Cognitive Function Ad8 score reviewed for issues:  Issues making decisions:no  Less interest in hobbies / activities:no  Repeats questions, stories (family complaining):no  Trouble using ordinary gadgets (microwave, computer, phone):no  Forgets the month or year: no  Mismanaging finances: no  Remembering appts:no  Daily problems with thinking and/or memory:no Ad8 score is=0     MMSE - Mini Mental State Exam 06/29/2016  Orientation to time 4  Orientation to Place 5  Registration 3  Attention/ Calculation 4  Recall 3  Language- name 2 objects 2  Language- repeat 1  Language- follow 3 step command 3  Language- read & follow direction 1  Write a sentence 1  Copy design 1  Total score 28        Immunization History  Administered Date(s) Administered  . Influenza Split 10/23/2010  . Influenza, High Dose Seasonal PF 11/13/2016, 11/13/2017  . Influenza,inj,Quad PF,6+ Mos 12/11/2012  . Influenza-Unspecified 11/05/2013, 11/16/2014  . Pneumococcal Conjugate-13 06/23/2010, 05/11/2014  . Tdap 04/01/2014  . Zoster 07/24/2006  . Zoster Recombinat (Shingrix) 10/12/2017, 12/14/2017    Screening Tests Health Maintenance  Topic Date Due  . PNA vac Low Risk Adult (2 of 2 - PPSV23) 05/11/2015  . COLONOSCOPY  08/16/2018  . INFLUENZA VACCINE  08/24/2018  . MAMMOGRAM  02/27/2020  . TETANUS/TDAP  03/31/2024  . DEXA SCAN  Completed  . Hepatitis C Screening  Completed  Plan:   See you next year!  Pt states  she will get Prev 13 at PCP appt next week.   Continue to eat heart healthy diet (full of fruits, vegetables, whole grains, lean protein, water--limit salt, fat, and sugar intake) and increase physical activity as tolerated.  Continue doing brain stimulating activities (puzzles, reading, adult coloring books, staying active) to keep memory sharp.     I have personally reviewed and noted the following in the patient's chart:   . Medical and social history . Use of alcohol, tobacco or illicit drugs  . Current medications and supplements . Functional ability and status . Nutritional status . Physical activity . Advanced directives . List of other physicians . Hospitalizations, surgeries, and ER visits in previous 12 months . Vitals . Screenings to include cognitive, depression, and falls . Referrals and appointments  In addition, I have reviewed and discussed with patient certain preventive protocols, quality metrics, and best practice recommendations. A written personalized care plan for preventive services as well as general preventive health recommendations were provided to patient.     Shela Nevin, [place]  07/05/2018   Medical screening examination/treatment was performed by qualified clinical staff member and as supervising physician I was immediately available for consultation/collaboration. I have reviewed documentation and agree with assessment and plan.  Penni Homans, MD

## 2018-07-05 ENCOUNTER — Other Ambulatory Visit: Payer: Self-pay

## 2018-07-05 ENCOUNTER — Encounter: Payer: Self-pay | Admitting: *Deleted

## 2018-07-05 ENCOUNTER — Ambulatory Visit (INDEPENDENT_AMBULATORY_CARE_PROVIDER_SITE_OTHER): Payer: Medicare Other | Admitting: *Deleted

## 2018-07-05 DIAGNOSIS — Z0000 Encounter for general adult medical examination without abnormal findings: Secondary | ICD-10-CM | POA: Diagnosis not present

## 2018-07-05 NOTE — Patient Instructions (Signed)
See you next year!  Continue to eat heart healthy diet (full of fruits, vegetables, whole grains, lean protein, water--limit salt, fat, and sugar intake) and increase physical activity as tolerated.  Continue doing brain stimulating activities (puzzles, reading, adult coloring books, staying active) to keep memory sharp.    Suzanne Turner , Thank you for taking time to come for your Medicare Wellness Visit. I appreciate your ongoing commitment to your health goals. Please review the following plan we discussed and let me know if I can assist you in the future.   These are the goals we discussed: Goals    . Maintain current health status (pt-stated)    . Maintain healthy active lifestyle.       This is a list of the screening recommended for you and due dates:  Health Maintenance  Topic Date Due  . Pneumonia vaccines (2 of 2 - PPSV23) 05/11/2015  . Colon Cancer Screening  08/16/2018  . Flu Shot  08/24/2018  . Mammogram  02/27/2020  . Tetanus Vaccine  03/31/2024  . DEXA scan (bone density measurement)  Completed  .  Hepatitis C: One time screening is recommended by Center for Disease Control  (CDC) for  adults born from [birth year] through 1965.   Completed    Health Maintenance After Age 30 After age 30, you are at a higher risk for certain long-term diseases and infections as well as injuries from falls. Falls are a major cause of broken bones and head injuries in people who are older than age 43. Getting regular preventive care can help to keep you healthy and well. Preventive care includes getting regular testing and making lifestyle changes as recommended by your health care provider. Talk with your health care provider about:  Which screenings and tests you should have. A screening is a test that checks for a disease when you have no symptoms.  A diet and exercise plan that is right for you. What should I know about screenings and tests to prevent falls? Screening and testing are the  best ways to find a health problem early. Early diagnosis and treatment give you the best chance of managing medical conditions that are common after age 53. Certain conditions and lifestyle choices may make you more likely to have a fall. Your health care provider may recommend:  Regular vision checks. Poor vision and conditions such as cataracts can make you more likely to have a fall. If you wear glasses, make sure to get your prescription updated if your vision changes.  Medicine review. Work with your health care provider to regularly review all of the medicines you are taking, including over-the-counter medicines. Ask your health care provider about any side effects that may make you more likely to have a fall. Tell your health care provider if any medicines that you take make you feel dizzy or sleepy.  Osteoporosis screening. Osteoporosis is a condition that causes the bones to get weaker. This can make the bones weak and cause them to break more easily.  Blood pressure screening. Blood pressure changes and medicines to control blood pressure can make you feel dizzy.  Strength and balance checks. Your health care provider may recommend certain tests to check your strength and balance while standing, walking, or changing positions.  Foot health exam. Foot pain and numbness, as well as not wearing proper footwear, can make you more likely to have a fall.  Depression screening. You may be more likely to have a fall if you  have a fear of falling, feel emotionally low, or feel unable to do activities that you used to do.  Alcohol use screening. Using too much alcohol can affect your balance and may make you more likely to have a fall. What actions can I take to lower my risk of falls? General instructions  Talk with your health care provider about your risks for falling. Tell your health care provider if: ? You fall. Be sure to tell your health care provider about all falls, even ones that  seem minor. ? You feel dizzy, sleepy, or off-balance.  Take over-the-counter and prescription medicines only as told by your health care provider. These include any supplements.  Eat a healthy diet and maintain a healthy weight. A healthy diet includes low-fat dairy products, low-fat (lean) meats, and fiber from whole grains, beans, and lots of fruits and vegetables. Home safety  Remove any tripping hazards, such as rugs, cords, and clutter.  Install safety equipment such as grab bars in bathrooms and safety rails on stairs.  Keep rooms and walkways well-lit. Activity   Follow a regular exercise program to stay fit. This will help you maintain your balance. Ask your health care provider what types of exercise are appropriate for you.  If you need a cane or walker, use it as recommended by your health care provider.  Wear supportive shoes that have nonskid soles. Lifestyle  Do not drink alcohol if your health care provider tells you not to drink.  If you drink alcohol, limit how much you have: ? 0-1 drink a day for women. ? 0-2 drinks a day for men.  Be aware of how much alcohol is in your drink. In the [place] one drink equals one typical bottle of beer (12 oz), one-half glass of wine (5 oz), or one shot of hard liquor (1 oz).  Do not use any products that contain nicotine or tobacco, such as cigarettes and e-cigarettes. If you need help quitting, ask your health care provider. Summary  Having a healthy lifestyle and getting preventive care can help to protect your health and wellness after age 4.  Screening and testing are the best way to find a health problem early and help you avoid having a fall. Early diagnosis and treatment give you the best chance for managing medical conditions that are more common for people who are older than age 70.  Falls are a major cause of broken bones and head injuries in people who are older than age 38. Take precautions to prevent a fall at  home.  Work with your health care provider to learn what changes you can make to improve your health and wellness and to prevent falls. This information is not intended to replace advice given to you by your health care provider. Make sure you discuss any questions you have with your health care provider. Document Released: 11/22/2016 Document Revised: 11/22/2016 Document Reviewed: 11/22/2016 Elsevier Interactive Patient Education  2019 [employer].

## 2018-07-11 DIAGNOSIS — H401122 Primary open-angle glaucoma, left eye, moderate stage: Secondary | ICD-10-CM | POA: Diagnosis not present

## 2018-07-11 DIAGNOSIS — H40051 Ocular hypertension, right eye: Secondary | ICD-10-CM | POA: Diagnosis not present

## 2018-07-11 DIAGNOSIS — H25813 Combined forms of age-related cataract, bilateral: Secondary | ICD-10-CM | POA: Diagnosis not present

## 2018-07-11 DIAGNOSIS — H04123 Dry eye syndrome of bilateral lacrimal glands: Secondary | ICD-10-CM | POA: Diagnosis not present

## 2018-07-16 ENCOUNTER — Other Ambulatory Visit: Payer: Self-pay

## 2018-07-16 ENCOUNTER — Ambulatory Visit (INDEPENDENT_AMBULATORY_CARE_PROVIDER_SITE_OTHER): Payer: Medicare Other | Admitting: Family Medicine

## 2018-07-16 DIAGNOSIS — H4010X0 Unspecified open-angle glaucoma, stage unspecified: Secondary | ICD-10-CM

## 2018-07-16 DIAGNOSIS — E559 Vitamin D deficiency, unspecified: Secondary | ICD-10-CM | POA: Diagnosis not present

## 2018-07-16 DIAGNOSIS — R7989 Other specified abnormal findings of blood chemistry: Secondary | ICD-10-CM

## 2018-07-16 DIAGNOSIS — F17200 Nicotine dependence, unspecified, uncomplicated: Secondary | ICD-10-CM

## 2018-07-16 DIAGNOSIS — E871 Hypo-osmolality and hyponatremia: Secondary | ICD-10-CM

## 2018-07-16 DIAGNOSIS — I10 Essential (primary) hypertension: Secondary | ICD-10-CM

## 2018-07-16 NOTE — Assessment & Plan Note (Signed)
Well controlled, no changes to meds. Encouraged heart healthy diet such as the DASH diet and exercise as tolerated.  °

## 2018-07-16 NOTE — Assessment & Plan Note (Signed)
Continue to monitor labs

## 2018-07-16 NOTE — Assessment & Plan Note (Signed)
Supplement and monitor 

## 2018-07-16 NOTE — Progress Notes (Signed)
Virtual Visit via Phone Note  I connected with Suzanne Turner on 07/16/18 at  9:20 AM EDT by a phone enabled telemedicine application and verified that I am speaking with the correct person using two identifiers.  Location: Patient: home Provider: home   I discussed the limitations of evaluation and management by telemedicine and the availability of in person appointments. The patient expressed understanding and agreed to proceed. Magdalene Molly, CMA attempted to get patient set up on video visit and was unable so visit was completed via telephone    Subjective:    Patient ID: Suzanne Turner, female    DOB: 24-Oct-1945, 73 y.o.   MRN: [id number]  No chief complaint on file.   HPI Patient is in today for chronic medical concerns including hypertension, hyperlipidemia, hypokalemia. She is staying in and is social distancing. Unfortunately she continues to smoke. She is questioning if she had COVID back in January when she had a bad respiratory illness. She feels better now but she does have a cough still secondary to smoking. She checks her bp and notes it can drop after she takes her HCTZ 1/2 tab 3 x a week her BP can drop down to 105/60 and she can feel tired. Denies CP/palp/SOB/HA/congestion/fevers/GI or GU c/o. Taking meds as prescribed  Past Medical History:  Diagnosis Date  . Abnormal thyroid blood test 01/28/2016  . Bronchitis   . Cataract   . Chronic eczema   . Glaucoma    Left eye  . Glaucoma 07/28/2014   Left eye Dr Shon Hough at [facility]  . H/O measles   . H/O mumps   . High cholesterol    no medications  . History of chicken pox   . Hypercalcemia 08/02/2014  . Hypertension   . Kidney stone   . 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   Left eye Dr Shon Hough at [facility]   . Osteoarthritis   . Osteopenia   . Preventative health care 01/30/2016  . Salivary duct stone   . Wears glasses     Past Surgical History:  Procedure Laterality Date   . ORIF [place] Right 1966  . REFRACTIVE SURGERY Left March/April 2018  . SALIVARY STONE REMOVAL Right 05/15/2012   Procedure: EXCISION OF RIGHT SUBMANDIBULAR GLAND ;  Surgeon: Izora Gala, MD;  Location: [place];  Service: ENT;  Laterality: Right;  . SALIVARY STONE REMOVAL N/A 11/18/2012   Procedure: INTRAORAL EXCISION SUBMANDIBULAR STONE RIGHT ;  Surgeon: Izora Gala, MD;  Location: [place];  Service: ENT;  Laterality: N/A;  . SKIN BIOPSY Right 12/2012   legs  . TONSILLECTOMY  1949  . TUBAL LIGATION  1977    Family History  Problem Relation Age of Onset  . Arrhythmia Mother   . Colon cancer Mother 54  . Cancer Mother   . Tremor Mother   . Arrhythmia Maternal Grandmother   . Glaucoma Maternal Grandmother   . Breast cancer Sister   . Cancer Sister        breast  . Glaucoma Sister   . Heart disease Father 21  . Arthritis Father        broken back with chronic pain  . Glaucoma Father   . Heart disease Brother   . Arrhythmia Brother   . Tremor Brother   . Hypertension Daughter   . Arrhythmia Maternal Uncle   . Heart disease Maternal Uncle   . Colon polyps Maternal Aunt  Social History   Socioeconomic History  . Marital status: Widowed    Spouse name: Not on file  . Number of children: Not on file  . Years of education: Not on file  . Highest education level: Not on file  Occupational History  . Not on file  Social Needs  . Financial resource strain: Not on file  . Food insecurity    Worry: Not on file    Inability: Not on file  . Transportation needs    Medical: Not on file    Non-medical: Not on file  Tobacco Use  . Smoking status: Current Every Day Smoker    Packs/day: 0.50    Years: 40.00    Pack years: 20.00    Types: Cigarettes  . Smokeless tobacco: Never Used  Substance and Sexual Activity  . Alcohol use: No  . Drug use: No  . Sexual activity: Not Currently    Comment: lives alone, widowed, retired from Administrator, education. and real estate, no dietary restrictions  Lifestyle  . Physical activity    Days per week: Not on file    Minutes per session: Not on file  . Stress: Not on file  Relationships  . Social Herbalist on phone: Not on file    Gets together: Not on file    Attends religious service: Not on file    Active member of club or organization: Not on file    Attends meetings of clubs or organizations: Not on file    Relationship status: Not on file  . Intimate partner violence    Fear of current or ex partner: Not on file    Emotionally abused: Not on file    Physically abused: Not on file    Forced sexual activity: Not on file  Other Topics Concern  . Not on file  Social History Narrative  . Not on file    Outpatient Medications Prior to Visit  Medication Sig Dispense Refill  . acetaminophen (TYLENOL) 325 MG tablet Take 325 mg by mouth daily as needed for pain.    Marland Kitchen aspirin EC 81 MG tablet Take 81 mg by mouth daily.      . Calcium Carb-Cholecalciferol (CALCIUM-VITAMIN D) 500-200 MG-UNIT tablet Take 1 tablet by mouth daily.    . hydrochlorothiazide (HYDRODIURIL) 25 MG tablet TAKE 1/2 TABLET BY MOUTH THREE TIMES PER WEEK. 19 tablet 3  . Latanoprostene Bunod (VYZULTA) 0.024 % SOLN Apply to eye.    Marland Kitchen LUMIGAN 0.01 % SOLN Place 1 drop into both eyes at bedtime.  0  . Multiple Vitamins-Minerals (HAIR/SKIN/NAILS/BIOTIN PO) Take 500 mg by mouth 3 (three) times daily.    . potassium chloride SA (K-DUR) 20 MEQ tablet TAKE 1 TABLET BY MOUTH DAILY 90 tablet 0   No facility-administered medications prior to visit.     Allergies  Allergen Reactions  . Medrol [Methylprednisolone] Swelling    Facial and lip swelling  . Lisinopril Rash  . Losartan Anxiety  . Penicillins Rash  . Sulfonamide Derivatives Rash  . Timolol Rash    Review of Systems  Constitutional: Positive for malaise/fatigue. Negative for fever.  HENT: Negative for congestion.   Eyes: Negative for  blurred vision.  Respiratory: Positive for cough and shortness of breath.   Cardiovascular: Negative for chest pain, palpitations and leg swelling.  Gastrointestinal: Negative for abdominal pain, blood in stool and nausea.  Genitourinary: Negative for dysuria and frequency.  Musculoskeletal: Negative for falls.  Skin:  Negative for rash.  Neurological: Negative for dizziness, loss of consciousness and headaches.  Endo/Heme/Allergies: Negative for environmental allergies.  Psychiatric/Behavioral: Negative for depression. The patient is not nervous/anxious.        Objective:    Physical Exam unable to obtain via telephone  BP 128/80 (BP Location: Left Arm, Patient Position: Sitting, Cuff Size: Normal)   Wt 125 lb (56.7 kg)   BMI 19.58 kg/m  Wt Readings from Last 3 Encounters:  07/16/18 125 lb (56.7 kg)  02/28/18 122 lb 6.4 oz (55.5 kg)  09/03/17 123 lb (55.8 kg)    Diabetic Foot Exam - Simple   No data filed     Lab Results  Component Value Date   WBC 4.1 02/28/2018   HGB 12.4 02/28/2018   HCT 36.9 02/28/2018   PLT 391.0 02/28/2018   GLUCOSE 69 (L) 02/28/2018   CHOL 166 02/28/2018   TRIG 98.0 02/28/2018   HDL 52.50 02/28/2018   LDLCALC 94 02/28/2018   ALT 14 02/28/2018   AST 19 02/28/2018   NA 132 (L) 02/28/2018   K 4.0 02/28/2018   CL 96 02/28/2018   CREATININE 0.78 02/28/2018   BUN 16 02/28/2018   CO2 29 02/28/2018   TSH 2.91 02/28/2018    Lab Results  Component Value Date   TSH 2.91 02/28/2018   Lab Results  Component Value Date   WBC 4.1 02/28/2018   HGB 12.4 02/28/2018   HCT 36.9 02/28/2018   MCV 92.8 02/28/2018   PLT 391.0 02/28/2018   Lab Results  Component Value Date   NA 132 (L) 02/28/2018   K 4.0 02/28/2018   CO2 29 02/28/2018   GLUCOSE 69 (L) 02/28/2018   BUN 16 02/28/2018   CREATININE 0.78 02/28/2018   BILITOT 0.5 02/28/2018   ALKPHOS 76 02/28/2018   AST 19 02/28/2018   ALT 14 02/28/2018   PROT 7.3 02/28/2018   ALBUMIN 4.1  02/28/2018   CALCIUM 9.4 02/28/2018   GFR 72.43 02/28/2018   Lab Results  Component Value Date   CHOL 166 02/28/2018   Lab Results  Component Value Date   HDL 52.50 02/28/2018   Lab Results  Component Value Date   LDLCALC 94 02/28/2018   Lab Results  Component Value Date   TRIG 98.0 02/28/2018   Lab Results  Component Value Date   CHOLHDL 3 02/28/2018   No results found for: HGBA1C     Assessment & Plan:   Problem List Items Addressed This Visit    Vitamin D deficiency    Supplement and monitor      Essential hypertension    Well controlled, no changes to meds. Encouraged heart healthy diet such as the DASH diet and exercise as tolerated.       Smoking    Unfortunately still smoking encouraged to keep trying to stop if at all possible. She is non committal       Open-angle glaucoma    Her pressure is down with new meds.       Abnormal thyroid blood test    Continue to monitor labs      Hyponatremia    Asymptomatic, will continue to monitor         I am having Lannette Donath. Carlota Raspberry maintain her aspirin EC, Multiple Vitamins-Minerals (HAIR/SKIN/NAILS/BIOTIN PO), acetaminophen, calcium-vitamin D, Lumigan, Latanoprostene Bunod, hydrochlorothiazide, and potassium chloride SA.  No orders of the defined types were placed in this encounter.   I discussed the assessment and treatment plan  with the patient. The patient was provided an opportunity to ask questions and all were answered. The patient agreed with the plan and demonstrated an understanding of the instructions.   The patient was advised to call back or seek an in-person evaluation if the symptoms worsen or if the condition fails to improve as anticipated.  I provided 25 minutes of non-face-to-face time during this encounter.   Penni Homans, MD

## 2018-07-16 NOTE — Assessment & Plan Note (Signed)
Unfortunately still smoking encouraged to keep trying to stop if at all possible. She is non committal

## 2018-07-16 NOTE — Assessment & Plan Note (Signed)
Asymptomatic, will continue to monitor 

## 2018-07-16 NOTE — Assessment & Plan Note (Signed)
Her pressure is down with new meds.

## 2018-10-16 ENCOUNTER — Other Ambulatory Visit: Payer: Self-pay

## 2018-10-17 ENCOUNTER — Ambulatory Visit (INDEPENDENT_AMBULATORY_CARE_PROVIDER_SITE_OTHER): Payer: Medicare Other | Admitting: Family Medicine

## 2018-10-17 DIAGNOSIS — E559 Vitamin D deficiency, unspecified: Secondary | ICD-10-CM | POA: Diagnosis not present

## 2018-10-17 DIAGNOSIS — I10 Essential (primary) hypertension: Secondary | ICD-10-CM

## 2018-10-17 DIAGNOSIS — M810 Age-related osteoporosis without current pathological fracture: Secondary | ICD-10-CM

## 2018-10-17 DIAGNOSIS — F17200 Nicotine dependence, unspecified, uncomplicated: Secondary | ICD-10-CM | POA: Diagnosis not present

## 2018-10-17 DIAGNOSIS — D508 Other iron deficiency anemias: Secondary | ICD-10-CM

## 2018-10-17 DIAGNOSIS — E782 Mixed hyperlipidemia: Secondary | ICD-10-CM

## 2018-10-17 LAB — TSH: TSH: 2.2 u[IU]/mL (ref 0.35–4.50)

## 2018-10-17 LAB — LIPID PANEL
Cholesterol: 204 mg/dL — ABNORMAL HIGH (ref 0–200)
HDL: 63.3 mg/dL (ref >39.00)
LDL Cholesterol: 112 mg/dL — ABNORMAL HIGH (ref 0–99)
NonHDL: 140.76
Total CHOL/HDL Ratio: 3
Triglycerides: 144 mg/dL (ref 0.0–149.0)
VLDL: 28.8 mg/dL (ref 0.0–40.0)

## 2018-10-17 LAB — CBC
HCT: 38.7 % (ref 36.0–46.0)
Hemoglobin: 13.4 g/dL (ref 12.0–15.0)
MCHC: 34.8 g/dL (ref 30.0–36.0)
MCV: 93.4 fl (ref 78.0–100.0)
Platelets: 226 10*3/uL (ref 150.0–400.0)
RBC: 4.14 Mil/uL (ref 3.87–5.11)
RDW: 13.4 % (ref 11.5–15.5)
WBC: 4.9 10*3/uL (ref 4.0–10.5)

## 2018-10-17 LAB — COMPREHENSIVE METABOLIC PANEL
ALT: 13 U/L (ref 0–35)
AST: 23 U/L (ref 0–37)
Albumin: 4.5 g/dL (ref 3.5–5.2)
Alkaline Phosphatase: 60 U/L (ref 39–117)
BUN: 16 mg/dL (ref 6–23)
CO2: 27 mEq/L (ref 19–32)
Calcium: 10 mg/dL (ref 8.4–10.5)
Chloride: 99 mEq/L (ref 96–112)
Creatinine, Ser: 0.89 mg/dL (ref 0.40–1.20)
GFR: 62.09 mL/min (ref >60.00)
Glucose, Bld: 87 mg/dL (ref 70–99)
Potassium: 3.9 mEq/L (ref 3.5–5.1)
Sodium: 133 mEq/L — ABNORMAL LOW (ref 135–145)
Total Bilirubin: 0.6 mg/dL (ref 0.2–1.2)
Total Protein: 7.5 g/dL (ref 6.0–8.3)

## 2018-10-17 LAB — VITAMIN D 25 HYDROXY (VIT D DEFICIENCY, FRACTURES): VITD: 49.57 ng/mL (ref 30.00–100.00)

## 2018-10-17 MED ORDER — HYDROCHLOROTHIAZIDE 25 MG PO TABS: ORAL_TABLET | ORAL | 3 refills | Status: DC

## 2018-10-17 NOTE — Patient Instructions (Addendum)
Multivitamin with minerals, selenium, zinc, vitamin D, Vit c  Hypertension, Adult High blood pressure (hypertension) is when the force of blood pumping through the arteries is too strong. The arteries are the blood vessels that carry blood from the heart throughout the body. Hypertension forces the heart to work harder to pump blood and may cause arteries to become narrow or stiff. Untreated or uncontrolled hypertension can cause a heart attack, heart failure, a stroke, kidney disease, and other problems. A blood pressure reading consists of a higher number over a lower number. Ideally, your blood pressure should be below 120/80. The first ("top") number is called the systolic pressure. It is a measure of the pressure in your arteries as your heart beats. The second ("bottom") number is called the diastolic pressure. It is a measure of the pressure in your arteries as the heart relaxes. What are the causes? The exact cause of this condition is not known. There are some conditions that result in or are related to high blood pressure. What increases the risk? Some risk factors for high blood pressure are under your control. The following factors may make you more likely to develop this condition:  Smoking.  Having type 2 diabetes mellitus, high cholesterol, or both.  Not getting enough exercise or physical activity.  Being overweight.  Having too much fat, sugar, calories, or salt (sodium) in your diet.  Drinking too much alcohol. Some risk factors for high blood pressure may be difficult or impossible to change. Some of these factors include:  Having chronic kidney disease.  Having a family history of high blood pressure.  Age. Risk increases with age.  Race. You may be at higher risk if you are African American.  Gender. Men are at higher risk than women before age 56. After age 53, women are at higher risk than men.  Having obstructive sleep apnea.  Stress. What are the signs or  symptoms? High blood pressure may not cause symptoms. Very high blood pressure (hypertensive crisis) may cause:  Headache.  Anxiety.  Shortness of breath.  Nosebleed.  Nausea and vomiting.  Vision changes.  Severe chest pain.  Seizures. How is this diagnosed? This condition is diagnosed by measuring your blood pressure while you are seated, with your arm resting on a flat surface, your legs uncrossed, and your feet flat on the floor. The cuff of the blood pressure monitor will be placed directly against the skin of your upper arm at the level of your heart. It should be measured at least twice using the same arm. Certain conditions can cause a difference in blood pressure between your right and left arms. Certain factors can cause blood pressure readings to be lower or higher than normal for a short period of time:  When your blood pressure is higher when you are in a health care provider's office than when you are at home, this is called white coat hypertension. Most people with this condition do not need medicines.  When your blood pressure is higher at home than when you are in a health care provider's office, this is called masked hypertension. Most people with this condition may need medicines to control blood pressure. If you have a high blood pressure reading during one visit or you have normal blood pressure with other risk factors, you may be asked to:  Return on a different day to have your blood pressure checked again.  Monitor your blood pressure at home for 1 week or longer. If you  are diagnosed with hypertension, you may have other blood or imaging tests to help your health care provider understand your overall risk for other conditions. How is this treated? This condition is treated by making healthy lifestyle changes, such as eating healthy foods, exercising more, and reducing your alcohol intake. Your health care provider may prescribe medicine if lifestyle changes  are not enough to get your blood pressure under control, and if:  Your systolic blood pressure is above 130.  Your diastolic blood pressure is above 80. Your personal target blood pressure may vary depending on your medical conditions, your age, and other factors. Follow these instructions at home: Eating and drinking   Eat a diet that is high in fiber and potassium, and low in sodium, added sugar, and fat. An example eating plan is called the DASH (Dietary Approaches to Stop Hypertension) diet. To eat this way: ? Eat plenty of fresh fruits and vegetables. Try to fill one half of your plate at each meal with fruits and vegetables. ? Eat whole grains, such as whole-wheat pasta, brown rice, or whole-grain bread. Fill about one fourth of your plate with whole grains. ? Eat or drink low-fat dairy products, such as skim milk or low-fat yogurt. ? Avoid fatty cuts of meat, processed or cured meats, and poultry with skin. Fill about one fourth of your plate with lean proteins, such as fish, chicken without skin, beans, eggs, or tofu. ? Avoid pre-made and processed foods. These tend to be higher in sodium, added sugar, and fat.  Reduce your daily sodium intake. Most people with hypertension should eat less than 1,500 mg of sodium a day.  Do not drink alcohol if: ? Your health care provider tells you not to drink. ? You are pregnant, may be pregnant, or are planning to become pregnant.  If you drink alcohol: ? Limit how much you use to:  0-1 drink a day for women.  0-2 drinks a day for men. ? Be aware of how much alcohol is in your drink. In the [place] one drink equals one 12 oz bottle of beer (355 mL), one 5 oz glass of wine (148 mL), or one 1 oz glass of hard liquor (44 mL). Lifestyle   Work with your health care provider to maintain a healthy body weight or to lose weight. Ask what an ideal weight is for you.  Get at least 30 minutes of exercise most days of the week. Activities may  include walking, swimming, or biking.  Include exercise to strengthen your muscles (resistance exercise), such as Pilates or lifting weights, as part of your weekly exercise routine. Try to do these types of exercises for 30 minutes at least 3 days a week.  Do not use any products that contain nicotine or tobacco, such as cigarettes, e-cigarettes, and chewing tobacco. If you need help quitting, ask your health care provider.  Monitor your blood pressure at home as told by your health care provider.  Keep all follow-up visits as told by your health care provider. This is important. Medicines  Take over-the-counter and prescription medicines only as told by your health care provider. Follow directions carefully. Blood pressure medicines must be taken as prescribed.  Do not skip doses of blood pressure medicine. Doing this puts you at risk for problems and can make the medicine less effective.  Ask your health care provider about side effects or reactions to medicines that you should watch for. Contact a health care provider if you:  Think you are having a reaction to a medicine you are taking.  Have headaches that keep coming back (recurring).  Feel dizzy.  Have swelling in your ankles.  Have trouble with your vision. Get help right away if you:  Develop a severe headache or confusion.  Have unusual weakness or numbness.  Feel faint.  Have severe pain in your chest or abdomen.  Vomit repeatedly.  Have trouble breathing. Summary  Hypertension is when the force of blood pumping through your arteries is too strong. If this condition is not controlled, it may put you at risk for serious complications.  Your personal target blood pressure may vary depending on your medical conditions, your age, and other factors. For most people, a normal blood pressure is less than 120/80.  Hypertension is treated with lifestyle changes, medicines, or a combination of both. Lifestyle changes  include losing weight, eating a healthy, low-sodium diet, exercising more, and limiting alcohol. This information is not intended to replace advice given to you by your health care provider. Make sure you discuss any questions you have with your health care provider. Document Released: 01/09/2005 Document Revised: 09/19/2017 Document Reviewed: 09/19/2017 Elsevier Patient Education  2020 [employer].

## 2018-10-17 NOTE — Assessment & Plan Note (Signed)
Supplement and monitor 

## 2018-10-17 NOTE — Assessment & Plan Note (Signed)
Encouraged to get adequate exercise, calcium and vitamin d intake. Check labs

## 2018-10-17 NOTE — Assessment & Plan Note (Signed)
Increase leafy greens, consider increased lean red meat and using cast iron cookware. Continue to monitor, report any concerns 

## 2018-10-17 NOTE — Assessment & Plan Note (Signed)
Encouraged complete cessation. Discussed need to quit as relates to risk of numerous cancers, cardiac and pulmonary disease as well as neurologic complications. Counseled for greater than 3 minutes. Smokes just under 1/2 ppd encouraged

## 2018-10-17 NOTE — Assessment & Plan Note (Signed)
Encouraged heart healthy diet, increase exercise, avoid trans fats, consider a krill oil cap daily 

## 2018-10-17 NOTE — Assessment & Plan Note (Signed)
Well controlled, no changes to meds. Encouraged heart healthy diet such as the DASH diet and exercise as tolerated.  °

## 2018-10-20 NOTE — Progress Notes (Signed)
Subjective:    Patient ID: Suzanne Turner, female    DOB: [birth year], 73 y.o.   MRN: [id number]  No chief complaint on file.   HPI Patient is in today for follow up on chronic medical concerns including hypertension, hyperlipidemia, osteoporosis. No recent febrile illness or hospitalizations. No polyuria or polydipsia. She continues to smoke unfortunately but has decreased the amount. She is maintaining quarantine. Is trying to maintain a heart healthy diet. Denies CP/palp/SOB/HA/congestion/fevers/GI or GU c/o. Taking meds as prescribed  Past Medical History:  Diagnosis Date  . Abnormal thyroid blood test 01/28/2016  . Bronchitis   . Cataract   . Chronic eczema   . Glaucoma    Left eye  . Glaucoma 07/28/2014   Left eye Dr Shon Hough at [facility]  . H/O measles   . H/O mumps   . High cholesterol    no medications  . History of chicken pox   . Hypercalcemia 08/02/2014  . Hypertension   . Kidney stone   . 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 Osteoarthritis   . Osteopenia   . Preventative health care 01/30/2016  . Salivary duct stone   . Wears glasses     Past Surgical History:  Procedure Laterality Date  . ORIF [place] Right 1966  . REFRACTIVE SURGERY Left March/April 2018  . SALIVARY STONE REMOVAL Right 05/15/2012   Procedure: EXCISION OF RIGHT SUBMANDIBULAR GLAND ;  Surgeon: Izora Gala, MD;  Location: [place];  Service: ENT;  Laterality: Right;  . SALIVARY STONE REMOVAL N/A 11/18/2012   Procedure: INTRAORAL EXCISION SUBMANDIBULAR STONE RIGHT ;  Surgeon: Izora Gala, MD;  Location: [place];  Service: ENT;  Laterality: N/A;  . SKIN BIOPSY Right 12/2012   legs  . TONSILLECTOMY  1949  . TUBAL LIGATION  1977    Family History  Problem Relation Age of Onset  . Arrhythmia Mother   . Colon cancer Mother 87  . Cancer Mother   . Tremor Mother   . Arrhythmia Maternal  Grandmother   . Glaucoma Maternal Grandmother   . Breast cancer Sister   . Cancer Sister        breast  . Glaucoma Sister   . Heart disease Father 44  . Arthritis Father        broken back with chronic pain  . Glaucoma Father   . Heart disease Brother   . Arrhythmia Brother   . Tremor Brother   . Hypertension Daughter   . Arrhythmia Maternal Uncle   . Heart disease Maternal Uncle   . Colon polyps Maternal Aunt     Social History   Socioeconomic History  . Marital status: Widowed    Spouse name: Not on file  . Number of children: Not on file  . Years of education: Not on file  . Highest education level: Not on file  Occupational History  . Not on file  Social Needs  . Financial resource strain: Not on file  . Food insecurity    Worry: Not on file    Inability: Not on file  . Transportation needs    Medical: Not on file    Non-medical: Not on file  Tobacco Use  . Smoking status: Current Every Day Smoker    Packs/day: 0.50    Years: 40.00    Pack years: 20.00    Types: Cigarettes  . Smokeless tobacco: Never  Used  Substance and Sexual Activity  . Alcohol use: No  . Drug use: No  . 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 and real estate, no dietary restrictions  Lifestyle  . Physical activity    Days per week: Not on file    Minutes per session: Not on file  . Stress: Not on file  Relationships  . Social Herbalist on phone: Not on file    Gets together: Not on file    Attends religious service: Not on file    Active member of club or organization: Not on file    Attends meetings of clubs or organizations: Not on file    Relationship status: Not on file  . Intimate partner violence    Fear of current or ex partner: Not on file    Emotionally abused: Not on file    Physically abused: Not on file    Forced sexual activity: Not on file  Other Topics Concern  . Not on file   Social History Narrative  . Not on file    Outpatient Medications Prior to Visit  Medication Sig Dispense Refill  . acetaminophen (TYLENOL) 325 MG tablet Take 325 mg by mouth daily as needed for pain.    Marland Kitchen aspirin EC 81 MG tablet Take 81 mg by mouth daily.      . Calcium Carb-Cholecalciferol (CALCIUM-VITAMIN D) 500-200 MG-UNIT tablet Take 1 tablet by mouth daily.    . Latanoprostene Bunod (VYZULTA) 0.024 % SOLN Apply to eye.    Marland Kitchen LUMIGAN 0.01 % SOLN Place 1 drop into both eyes at bedtime.  0  . Multiple Vitamins-Minerals (HAIR/SKIN/NAILS/BIOTIN PO) Take 500 mg by mouth 3 (three) times daily.    . potassium chloride SA (K-DUR) 20 MEQ tablet TAKE 1 TABLET BY MOUTH DAILY 90 tablet 0  . hydrochlorothiazide (HYDRODIURIL) 25 MG tablet TAKE 1/2 TABLET BY MOUTH THREE TIMES PER WEEK. 19 tablet 3   No facility-administered medications prior to visit.     Allergies  Allergen Reactions  . Medrol [Methylprednisolone] Swelling    Facial and lip swelling  . Lisinopril Rash  . Losartan Anxiety  . Penicillins Rash  . Sulfonamide Derivatives Rash  . Timolol Rash    Review of Systems  Constitutional: Negative for fever and malaise/fatigue.  HENT: Negative for congestion.   Eyes: Negative for blurred vision.  Respiratory: Negative for shortness of breath.   Cardiovascular: Negative for chest pain, palpitations and leg swelling.  Gastrointestinal: Negative for abdominal pain, blood in stool and nausea.  Genitourinary: Negative for dysuria and frequency.  Musculoskeletal: Negative for falls.  Skin: Negative for rash.  Neurological: Negative for dizziness, loss of consciousness and headaches.  Endo/Heme/Allergies: Negative for environmental allergies.  Psychiatric/Behavioral: Negative for depression. The patient is not nervous/anxious.        Objective:    Physical Exam Vitals signs and nursing note reviewed.  Constitutional:      General: She is not in acute distress.    Appearance: She  is well-developed.  HENT:     Head: Normocephalic and atraumatic.     Nose: Nose normal.  Eyes:     General:        Right eye: No discharge.        Left eye: No discharge.  Neck:     Musculoskeletal: Normal range of motion and neck supple.  Cardiovascular:     Rate and Rhythm:  Normal rate and regular rhythm.     Heart sounds: No murmur.  Pulmonary:     Effort: Pulmonary effort is normal.     Breath sounds: Normal breath sounds.  Abdominal:     General: Bowel sounds are normal.     Palpations: Abdomen is soft.     Tenderness: There is no abdominal tenderness.  Skin:    General: Skin is warm and dry.  Neurological:     Mental Status: She is alert and oriented to person, place, and time.     BP 130/70 (BP Location: Left Arm, Patient Position: Sitting, Cuff Size: Normal)   Pulse 97   Temp (!) 96.4 F (35.8 C) (Temporal)   Resp 18   Wt 126 lb 9.6 oz (57.4 kg)   SpO2 90%   BMI 19.83 kg/m  Wt Readings from Last 3 Encounters:  10/17/18 126 lb 9.6 oz (57.4 kg)  07/16/18 125 lb (56.7 kg)  02/28/18 122 lb 6.4 oz (55.5 kg)    Diabetic Foot Exam - Simple   No data filed     Lab Results  Component Value Date   WBC 4.9 10/17/2018   HGB 13.4 10/17/2018   HCT 38.7 10/17/2018   PLT 226.0 10/17/2018   GLUCOSE 87 10/17/2018   CHOL 204 (H) 10/17/2018   TRIG 144.0 10/17/2018   HDL 63.30 10/17/2018   LDLCALC 112 (H) 10/17/2018   ALT 13 10/17/2018   AST 23 10/17/2018   NA 133 (L) 10/17/2018   K 3.9 10/17/2018   CL 99 10/17/2018   CREATININE 0.89 10/17/2018   BUN 16 10/17/2018   CO2 27 10/17/2018   TSH 2.20 10/17/2018    Lab Results  Component Value Date   TSH 2.20 10/17/2018   Lab Results  Component Value Date   WBC 4.9 10/17/2018   HGB 13.4 10/17/2018   HCT 38.7 10/17/2018   MCV 93.4 10/17/2018   PLT 226.0 10/17/2018   Lab Results  Component Value Date   NA 133 (L) 10/17/2018   K 3.9 10/17/2018   CO2 27 10/17/2018   GLUCOSE 87 10/17/2018   BUN 16  10/17/2018   CREATININE 0.89 10/17/2018   BILITOT 0.6 10/17/2018   ALKPHOS 60 10/17/2018   AST 23 10/17/2018   ALT 13 10/17/2018   PROT 7.5 10/17/2018   ALBUMIN 4.5 10/17/2018   CALCIUM 10.0 10/17/2018   GFR 62.09 10/17/2018   Lab Results  Component Value Date   CHOL 204 (H) 10/17/2018   Lab Results  Component Value Date   HDL 63.30 10/17/2018   Lab Results  Component Value Date   LDLCALC 112 (H) 10/17/2018   Lab Results  Component Value Date   TRIG 144.0 10/17/2018   Lab Results  Component Value Date   CHOLHDL 3 10/17/2018   No results found for: HGBA1C     Assessment & Plan:   Problem List Items Addressed This Visit    Vitamin D deficiency    Supplement and monitor      Relevant Orders   VITAMIN D 25 Hydroxy (Vit-D Deficiency, Fractures) (Completed)   Essential hypertension    Well controlled, no changes to meds. Encouraged heart healthy diet such as the DASH diet and exercise as tolerated.       Relevant Medications   hydrochlorothiazide (HYDRODIURIL) 25 MG tablet   Other Relevant Orders   CBC (Completed)   Comprehensive metabolic panel (Completed)   TSH (Completed)   Osteoporosis    Encouraged to  get adequate exercise, calcium and vitamin d intake. Check labs      Smoking    Encouraged complete cessation. Discussed need to quit as relates to risk of numerous cancers, cardiac and pulmonary disease as well as neurologic complications. Counseled for greater than 3 minutes. Smokes just under 1/2 ppd encouraged       Hyperlipidemia    Encouraged heart healthy diet, increase exercise, avoid trans fats, consider a krill oil cap daily      Relevant Medications   hydrochlorothiazide (HYDRODIURIL) 25 MG tablet   Other Relevant Orders   Lipid panel (Completed)   Other iron deficiency anemias    Increase leafy greens, consider increased lean red meat and using cast iron cookware. Continue to monitor, report any concerns         I am having Lannette Donath.  Carlota Raspberry maintain her aspirin EC, Multiple Vitamins-Minerals (HAIR/SKIN/NAILS/BIOTIN PO), acetaminophen, calcium-vitamin D, Lumigan, Latanoprostene Bunod, potassium chloride SA, and hydrochlorothiazide.  Meds ordered this encounter  Medications  . hydrochlorothiazide (HYDRODIURIL) 25 MG tablet    Sig: TAKE 1/2 TABLET BY MOUTH THREE TIMES PER WEEK.    Dispense:  19 tablet    Refill:  3     Penni Homans, MD

## 2018-10-25 ENCOUNTER — Encounter: Payer: Self-pay | Admitting: Gastroenterology

## 2018-10-28 ENCOUNTER — Encounter: Payer: Self-pay | Admitting: Gastroenterology

## 2018-11-07 ENCOUNTER — Encounter: Payer: Self-pay | Admitting: Family Medicine

## 2018-11-07 DIAGNOSIS — Z23 Encounter for immunization: Secondary | ICD-10-CM | POA: Diagnosis not present

## 2018-11-08 ENCOUNTER — Ambulatory Visit (INDEPENDENT_AMBULATORY_CARE_PROVIDER_SITE_OTHER): Payer: Medicare Other | Admitting: Gastroenterology

## 2018-11-08 ENCOUNTER — Encounter: Payer: Self-pay | Admitting: Gastroenterology

## 2018-11-08 VITALS — BP 122/78 | HR 84 | Temp 98.6°F | Ht 67.0 in | Wt 125.0 lb

## 2018-11-08 DIAGNOSIS — Z1211 Encounter for screening for malignant neoplasm of colon: Secondary | ICD-10-CM | POA: Diagnosis not present

## 2018-11-08 DIAGNOSIS — Z800 Family history of malignant neoplasm of digestive organs: Secondary | ICD-10-CM | POA: Diagnosis not present

## 2018-11-08 NOTE — Progress Notes (Signed)
Chief Complaint: Ongoing CRC screening, family history of colon cancer  Referring Provider:     Mosie Lukes, MD  HPI:    Suzanne Turner is a 73 y.o. female referred to the [facility] to discuss ongoing CRC screening. FHx n/f mother with CRC in her 13's.  She thinks she had a history of polyps on her initial colonoscopy completed in [place].  Since then, no polyps.  She is otherwise without any GI symptoms, to include hematochezia, melena, change in bowel habits, abdominal pain, fever, chills, nausea, vomiting.  Reviewed prior records.  Colonoscopy in 2000 completed in [place] was actually normal (internal hemorrhoids), and repeat recommended in 5 years due to family history.  Was repeated in 2005, and this report not in EMR, but she thinks that was also without polyps.  Since then colonoscopy in 2010 in 2015 by Dr. Maurene Capes were normal/no polyps.  All colonoscopies completed without sedation per her request.   Endoscopic Hx: - Colonoscopy (07/2013): Normal. Repeat 5 years - Colonoscopy (06/2008, Dr. Olevia Perches): Normal. Repeat 5 years - Colonoscopy (2005 in [place]): No report in EMR for review.  Patient reports no polyps. - Colonoscopy (2000 in [place]): Internal hemorrhoids, otherwise normal. Repeat in 5 years   Past Medical History:  Diagnosis Date  . Abnormal thyroid blood test 01/28/2016  . Bronchitis   . Cataract   . Chronic eczema   . Glaucoma    Left eye  . Glaucoma 07/28/2014   Left eye Dr Shon Hough at [facility]  . H/O measles   . H/O mumps   . High cholesterol    no medications  . History of chicken pox   . Hypercalcemia 08/02/2014  . Hypertension   . Kidney stone   . 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 Osteoarthritis   . Osteopenia   . Preventative health care 01/30/2016  . Salivary duct stone   . Skin cancer   . Wears glasses      Past Surgical History:  Procedure  Laterality Date  . ORIF [place] Right 1966  . REFRACTIVE SURGERY Left March/April 2018  . SALIVARY STONE REMOVAL Right 05/15/2012   Procedure: EXCISION OF RIGHT SUBMANDIBULAR GLAND ;  Surgeon: Izora Gala, MD;  Location: [place];  Service: ENT;  Laterality: Right;  . SALIVARY STONE REMOVAL N/A 11/18/2012   Procedure: INTRAORAL EXCISION SUBMANDIBULAR STONE RIGHT ;  Surgeon: Izora Gala, MD;  Location: [place];  Service: ENT;  Laterality: N/A;  . SKIN BIOPSY Right 12/2012   legs  . TONSILLECTOMY  1949  . TUBAL LIGATION  1977   Family History  Problem Relation Age of Onset  . Arrhythmia Mother   . Colon cancer Mother 22  . Cancer Mother   . Tremor Mother   . Arrhythmia Maternal Grandmother   . Glaucoma Maternal Grandmother   . Breast cancer Sister   . Cancer Sister        breast  . Glaucoma Sister   . Heart disease Father 54  . Arthritis Father        broken back with chronic pain  . Glaucoma Father   . Heart disease Brother   . Arrhythmia Brother   . Tremor Brother   . Hypertension Daughter   . Arrhythmia Maternal Uncle   . Heart disease Maternal Uncle   .  Colon polyps Maternal Aunt    Social History   Tobacco Use  . Smoking status: Current Every Day Smoker    Packs/day: 0.50    Years: 40.00    Pack years: 20.00    Types: Cigarettes  . Smokeless tobacco: Never Used  Substance Use Topics  . Alcohol use: No  . Drug use: No   Current Outpatient Medications  Medication Sig Dispense Refill  . acetaminophen (TYLENOL) 325 MG tablet Take 325 mg by mouth daily as needed for pain.    Marland Kitchen aspirin EC 81 MG tablet Take 81 mg by mouth daily.      . Calcium Carb-Cholecalciferol (CALCIUM-VITAMIN D) 500-200 MG-UNIT tablet Take 1 tablet by mouth daily.    . hydrochlorothiazide (HYDRODIURIL) 25 MG tablet TAKE 1/2 TABLET BY MOUTH THREE TIMES PER WEEK. 19 tablet 3  . Latanoprostene Bunod (VYZULTA) 0.024 % SOLN Apply to eye.    . Multiple  Vitamins-Minerals (HAIR/SKIN/NAILS/BIOTIN PO) Take 500 mg by mouth 3 (three) times daily.    . potassium chloride SA (K-DUR) 20 MEQ tablet TAKE 1 TABLET BY MOUTH DAILY 90 tablet 0   No current facility-administered medications for this visit.    Allergies  Allergen Reactions  . Medrol [Methylprednisolone] Swelling    Facial and lip swelling  . Lisinopril Rash  . Losartan Anxiety  . Penicillins Rash  . Sulfonamide Derivatives Rash  . Timolol Rash     Review of Systems: All systems reviewed and negative except where noted in HPI.     Physical Exam:    Wt Readings from Last 3 Encounters:  11/08/18 125 lb (56.7 kg)  10/17/18 126 lb 9.6 oz (57.4 kg)  07/16/18 125 lb (56.7 kg)    BP 122/78 (BP Location: Right Arm, Patient Position: Sitting, Cuff Size: Normal)   Pulse 84   Temp 98.6 F (37 C) (Oral)   Ht 5\' 7"  (1.702 m)   Wt 125 lb (56.7 kg)   BMI 19.58 kg/m  Constitutional:  Pleasant, in no acute distress. Psychiatric: Normal mood and affect. Behavior is normal. EENT: Pupils normal.  Conjunctivae are normal. No scleral icterus. Neck supple. No cervical LAD. Cardiovascular: Normal rate, regular rhythm. No edema Pulmonary/chest: Effort normal and breath sounds normal. No wheezing, rales or rhonchi. Abdominal: Soft, nondistended, nontender. Bowel sounds active throughout. There are no masses palpable. No hepatomegaly. Neurological: Alert and oriented to person place and time. Skin: Skin is warm and dry. No rashes noted.   ASSESSMENT AND PLAN;   1) CRC screening 2) Family history of colon cancer Family history notable for mother with CRC diagnosed in her 22s.  She has had at least 4 colonoscopies to date.  Records available for 3 of those colonoscopies, to include what she thinks was her index colonoscopy in 2000 (no polyps).  Her last 2 colonoscopies in 2010 in 2015 were also normal/no polyps.  Prior recommendation was given to repeat in 5 years due to family history.   However, Per current societal guidelines, mother was diagnosed after age 76, and therefore without a personal history of polyps or active GI symptoms, moving to a 10-year screening protocol would be appropriate.   We discussed appropriate screening intervals and strategies at length today.  Offered repeat colonoscopy given prior recommendation for 5-year interval, but owing to current societal guidelines, she does not want to proceed with this.  In fact, she would very much like to avoid repeat colonoscopy at this juncture unless it is very strongly indicated.  We also discussed the utility of Cologuard, and similarly, she declined this option today as well.  Given findings of previous colonoscopies, current societal guidelines, and strong patient wishes, holding off on repeat colonoscopy today.  Can always call me should she change her mind, or if she has active GI symptoms that need to be addressed.  Otherwise, can follow-up in 5 years to discuss appropriateness of repeat screening at that time.  Alternatively, discussed that she can also call should she change her mind and wish to proceed with Cologuard in the interim as a screening modality.  She understands, and was appreciative of the appointment in conversation.   Lavena Bullion, DO, FACG  11/08/2018, 10:02 AM   Mosie Lukes, MD

## 2018-11-08 NOTE — Patient Instructions (Signed)
Please follow up as needed 

## 2018-11-20 DIAGNOSIS — H40011 Open angle with borderline findings, low risk, right eye: Secondary | ICD-10-CM | POA: Diagnosis not present

## 2018-11-20 DIAGNOSIS — H25813 Combined forms of age-related cataract, bilateral: Secondary | ICD-10-CM | POA: Diagnosis not present

## 2018-11-20 DIAGNOSIS — H43813 Vitreous degeneration, bilateral: Secondary | ICD-10-CM | POA: Diagnosis not present

## 2018-11-20 DIAGNOSIS — H401122 Primary open-angle glaucoma, left eye, moderate stage: Secondary | ICD-10-CM | POA: Diagnosis not present

## 2018-12-04 DIAGNOSIS — L820 Inflamed seborrheic keratosis: Secondary | ICD-10-CM | POA: Diagnosis not present

## 2018-12-04 DIAGNOSIS — L814 Other melanin hyperpigmentation: Secondary | ICD-10-CM | POA: Diagnosis not present

## 2018-12-04 DIAGNOSIS — L821 Other seborrheic keratosis: Secondary | ICD-10-CM | POA: Diagnosis not present

## 2018-12-04 DIAGNOSIS — D1801 Hemangioma of skin and subcutaneous tissue: Secondary | ICD-10-CM | POA: Diagnosis not present

## 2018-12-04 DIAGNOSIS — Z85828 Personal history of other malignant neoplasm of skin: Secondary | ICD-10-CM | POA: Diagnosis not present

## 2018-12-04 DIAGNOSIS — L812 Freckles: Secondary | ICD-10-CM | POA: Diagnosis not present

## 2019-01-14 ENCOUNTER — Other Ambulatory Visit: Payer: Self-pay

## 2019-01-14 ENCOUNTER — Ambulatory Visit (INDEPENDENT_AMBULATORY_CARE_PROVIDER_SITE_OTHER): Payer: Medicare Other | Admitting: Family Medicine

## 2019-01-14 ENCOUNTER — Telehealth: Payer: Self-pay | Admitting: *Deleted

## 2019-01-14 DIAGNOSIS — E782 Mixed hyperlipidemia: Secondary | ICD-10-CM

## 2019-01-14 DIAGNOSIS — E559 Vitamin D deficiency, unspecified: Secondary | ICD-10-CM

## 2019-01-14 DIAGNOSIS — I10 Essential (primary) hypertension: Secondary | ICD-10-CM | POA: Diagnosis not present

## 2019-01-14 DIAGNOSIS — M810 Age-related osteoporosis without current pathological fracture: Secondary | ICD-10-CM

## 2019-01-14 NOTE — Telephone Encounter (Signed)
Needs follow up in April

## 2019-01-15 NOTE — Assessment & Plan Note (Signed)
Supplement and monitor 

## 2019-01-15 NOTE — Progress Notes (Signed)
Virtual Visit via phone Note  I connected with [name] on 12/22/20at  2:00 PM EST by a phone enabled telemedicine application and verified that I am speaking with the correct person using two identifiers.  Location: Patient: home Provider: home   I discussed the limitations of evaluation and management by telemedicine and the availability of in person appointments. The patient expressed understanding and agreed to proceed. Magdalene Molly, CMA was able to get the patient set up on a visit, phone after being unable to perform video   Subjective:    Patient ID: Suzanne Turner, female    DOB: September 09, 1945, 73 y.o.   MRN: [id number]  Chief Complaint  Patient presents with  . Hypertension  . Hyperlipidemia    HPI Patient is in today for follow up on chronic medical concerns including hypertension, hyperlipidemia and more. She is maintaining quarantine well. No recent febrile illness or hospitalizations. No acute cocnncerns. Is trying to eat well and and stay active. Denies CP/palp/SOB/HA/congestion/fevers/GI or GU c/o. Taking meds as prescribed  Past Medical History:  Diagnosis Date  . Abnormal thyroid blood test 01/28/2016  . Bronchitis   . Cataract   . Chronic eczema   . Glaucoma    Left eye  . Glaucoma 07/28/2014   Left eye Dr Shon Hough at [facility]  . H/O measles   . H/O mumps   . High cholesterol    no medications  . History of chicken pox   . Hypercalcemia 08/02/2014  . Hypertension   . Kidney stone   . 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 Osteoarthritis   . Osteopenia   . Preventative health care 01/30/2016  . Salivary duct stone   . Skin cancer   . Wears glasses     Past Surgical History:  Procedure Laterality Date  . ORIF [place] Right 1966  . REFRACTIVE SURGERY Left March/April 2018  . SALIVARY STONE REMOVAL Right 05/15/2012   Procedure: EXCISION OF RIGHT SUBMANDIBULAR  GLAND ;  Surgeon: Izora Gala, MD;  Location: [place];  Service: ENT;  Laterality: Right;  . SALIVARY STONE REMOVAL N/A 11/18/2012   Procedure: INTRAORAL EXCISION SUBMANDIBULAR STONE RIGHT ;  Surgeon: Izora Gala, MD;  Location: [place];  Service: ENT;  Laterality: N/A;  . SKIN BIOPSY Right 12/2012   legs  . TONSILLECTOMY  1949  . TUBAL LIGATION  1977    Family History  Problem Relation Age of Onset  . Arrhythmia Mother   . Colon cancer Mother 61  . Cancer Mother   . Tremor Mother   . Arrhythmia Maternal Grandmother   . Glaucoma Maternal Grandmother   . Breast cancer Sister   . Cancer Sister        breast  . Glaucoma Sister   . Heart disease Father 23  . Arthritis Father        broken back with chronic pain  . Glaucoma Father   . Heart disease Brother   . Arrhythmia Brother   . Tremor Brother   . Hypertension Daughter   . Arrhythmia Maternal Uncle   . Heart disease Maternal Uncle   . Colon polyps Maternal Aunt     Social History   Socioeconomic History  . Marital status: Widowed    Spouse name: Not on file  . Number of children: Not on file  . Years of education: Not on file  . Highest  education level: Not on file  Occupational History  . Not on file  Tobacco Use  . Smoking status: Current Every Day Smoker    Packs/day: 0.50    Years: 40.00    Pack years: 20.00    Types: Cigarettes  . Smokeless tobacco: Never Used  Substance and Sexual Activity  . Alcohol use: No  . Drug use: No  . 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 and real estate, no dietary restrictions  Other Topics Concern  . Not on file  Social History Narrative  . Not on file   Social Determinants of Health   Financial Resource Strain:   . Difficulty of Paying Living Expenses: Not on file  Food Insecurity:   . Worried About Charity fundraiser in the Last Year: Not on file  . Ran Out of Food in the Last  Year: Not on file  Transportation Needs:   . Lack of Transportation (Medical): Not on file  . Lack of Transportation (Non-Medical): Not on file  Physical Activity:   . Days of Exercise per Week: Not on file  . Minutes of Exercise per Session: Not on file  Stress:   . Feeling of Stress : Not on file  Social Connections:   . Frequency of Communication with Friends and Family: Not on file  . Frequency of Social Gatherings with Friends and Family: Not on file  . Attends Religious Services: Not on file  . Active Member of Clubs or Organizations: Not on file  . Attends Archivist Meetings: Not on file  . Marital Status: Not on file  Intimate Partner Violence:   . Fear of Current or Ex-Partner: Not on file  . Emotionally Abused: Not on file  . Physically Abused: Not on file  . Sexually Abused: Not on file    Outpatient Medications Prior to Visit  Medication Sig Dispense Refill  . acetaminophen (TYLENOL) 325 MG tablet Take 325 mg by mouth daily as needed for pain.    Marland Kitchen aspirin EC 81 MG tablet Take 81 mg by mouth daily.      . Calcium Carb-Cholecalciferol (CALCIUM-VITAMIN D) 500-200 MG-UNIT tablet Take 1 tablet by mouth daily.    . hydrochlorothiazide (HYDRODIURIL) 25 MG tablet TAKE 1/2 TABLET BY MOUTH THREE TIMES PER WEEK. 19 tablet 3  . Latanoprostene Bunod (VYZULTA) 0.024 % SOLN Apply to eye.    . Multiple Vitamins-Minerals (HAIR/SKIN/NAILS/BIOTIN PO) Take 500 mg by mouth 3 (three) times daily.    . potassium chloride SA (K-DUR) 20 MEQ tablet TAKE 1 TABLET BY MOUTH DAILY 90 tablet 0   No facility-administered medications prior to visit.    Allergies  Allergen Reactions  . Medrol [Methylprednisolone] Swelling    Facial and lip swelling  . Lisinopril Rash  . Losartan Anxiety  . Penicillins Rash  . Sulfonamide Derivatives Rash  . Timolol Rash    Review of Systems  Constitutional: Negative for fever and malaise/fatigue.  HENT: Negative for congestion.   Eyes:  Negative for blurred vision.  Respiratory: Negative for shortness of breath.   Cardiovascular: Negative for chest pain, palpitations and leg swelling.  Gastrointestinal: Negative for abdominal pain, blood in stool and nausea.  Genitourinary: Negative for dysuria and frequency.  Musculoskeletal: Negative for falls.  Skin: Negative for rash.  Neurological: Negative for dizziness, loss of consciousness and headaches.  Endo/Heme/Allergies: Negative for environmental allergies.  Psychiatric/Behavioral: Negative for depression. The patient is not  nervous/anxious.        Objective:    Physical Exam unable to obtain via phone visit  SpO2 97%  Wt Readings from Last 3 Encounters:  11/08/18 125 lb (56.7 kg)  10/17/18 126 lb 9.6 oz (57.4 kg)  07/16/18 125 lb (56.7 kg)    Diabetic Foot Exam - Simple   No data filed     Lab Results  Component Value Date   WBC 4.9 10/17/2018   HGB 13.4 10/17/2018   HCT 38.7 10/17/2018   PLT 226.0 10/17/2018   GLUCOSE 87 10/17/2018   CHOL 204 (H) 10/17/2018   TRIG 144.0 10/17/2018   HDL 63.30 10/17/2018   LDLCALC 112 (H) 10/17/2018   ALT 13 10/17/2018   AST 23 10/17/2018   NA 133 (L) 10/17/2018   K 3.9 10/17/2018   CL 99 10/17/2018   CREATININE 0.89 10/17/2018   BUN 16 10/17/2018   CO2 27 10/17/2018   TSH 2.20 10/17/2018    Lab Results  Component Value Date   TSH 2.20 10/17/2018   Lab Results  Component Value Date   WBC 4.9 10/17/2018   HGB 13.4 10/17/2018   HCT 38.7 10/17/2018   MCV 93.4 10/17/2018   PLT 226.0 10/17/2018   Lab Results  Component Value Date   NA 133 (L) 10/17/2018   K 3.9 10/17/2018   CO2 27 10/17/2018   GLUCOSE 87 10/17/2018   BUN 16 10/17/2018   CREATININE 0.89 10/17/2018   BILITOT 0.6 10/17/2018   ALKPHOS 60 10/17/2018   AST 23 10/17/2018   ALT 13 10/17/2018   PROT 7.5 10/17/2018   ALBUMIN 4.5 10/17/2018   CALCIUM 10.0 10/17/2018   GFR 62.09 10/17/2018   Lab Results  Component Value Date   CHOL 204  (H) 10/17/2018   Lab Results  Component Value Date   HDL 63.30 10/17/2018   Lab Results  Component Value Date   LDLCALC 112 (H) 10/17/2018   Lab Results  Component Value Date   TRIG 144.0 10/17/2018   Lab Results  Component Value Date   CHOLHDL 3 10/17/2018   No results found for: HGBA1C     Assessment & Plan:   Problem List Items Addressed This Visit    Vitamin D deficiency    Supplement and monitor      Essential hypertension    Well controlled, no changes to meds. Encouraged heart healthy diet such as the DASH diet and exercise as tolerated.       Osteoporosis    Encouraged to get adequate exercise, calcium and vitamin d intake      Hyperlipidemia    Encouraged heart healthy diet, increase exercise, avoid trans fats, consider a krill oil cap daily         I am having Lannette Donath. Carlota Raspberry maintain her aspirin EC, Multiple Vitamins-Minerals (HAIR/SKIN/NAILS/BIOTIN PO), acetaminophen, calcium-vitamin D, Latanoprostene Bunod, potassium chloride SA, and hydrochlorothiazide.  No orders of the defined types were placed in this encounter.    I discussed the assessment and treatment plan with the patient. The patient was provided an opportunity to ask questions and all were answered. The patient agreed with the plan and demonstrated an understanding of the instructions.   The patient was advised to call back or seek an in-person evaluation if the symptoms worsen or if the condition fails to improve as anticipated.  I provided 25 minutes of non-face-to-face time during this encounter.   Penni Homans, MD

## 2019-01-15 NOTE — Assessment & Plan Note (Signed)
Well controlled, no changes to meds. Encouraged heart healthy diet such as the DASH diet and exercise as tolerated.  °

## 2019-01-15 NOTE — Assessment & Plan Note (Signed)
Encouraged heart healthy diet, increase exercise, avoid trans fats, consider a krill oil cap daily 

## 2019-01-15 NOTE — Assessment & Plan Note (Signed)
Encouraged to get adequate exercise, calcium and vitamin d intake 

## 2019-02-23 ENCOUNTER — Ambulatory Visit: Payer: Medicare Other

## 2019-03-01 ENCOUNTER — Ambulatory Visit: Payer: Medicare Other | Attending: Internal Medicine

## 2019-03-01 DIAGNOSIS — Z23 Encounter for immunization: Secondary | ICD-10-CM | POA: Insufficient documentation

## 2019-03-01 NOTE — Progress Notes (Signed)
   Covid-19 Vaccination Clinic  Name:  Suzanne Turner    MRN: [id number] DOB: 1946-01-15  03/01/2019  [name] was observed post Covid-19 immunization for 15 minutes without incidence. She was provided with Vaccine Information Sheet and instruction to access the V-Safe system.   [name] was instructed to call 911 with any severe reactions post vaccine: Marland Kitchen Difficulty breathing  . Swelling of your face and throat  . A fast heartbeat  . A bad rash all over your body  . Dizziness and weakness    Immunizations Administered    Name Date Dose VIS Date Route   Pfizer COVID-19 Vaccine 03/01/2019 11:50 AM 0.3 mL 01/03/2019 Intramuscular   Manufacturer: [place]   Lot: [id number]   [place]: [id number]

## 2019-03-05 ENCOUNTER — Other Ambulatory Visit: Payer: Self-pay | Admitting: Family Medicine

## 2019-03-05 DIAGNOSIS — Z1231 Encounter for screening mammogram for malignant neoplasm of breast: Secondary | ICD-10-CM

## 2019-03-16 ENCOUNTER — Ambulatory Visit: Payer: Medicare Other

## 2019-03-26 ENCOUNTER — Ambulatory Visit: Payer: Medicare Other | Attending: Internal Medicine

## 2019-03-26 DIAGNOSIS — Z23 Encounter for immunization: Secondary | ICD-10-CM | POA: Insufficient documentation

## 2019-03-26 NOTE — Progress Notes (Signed)
   Covid-19 Vaccination Clinic  Name:  Suzanne Turner    MRN: [id number] DOB: [birth year]  03/26/2019  [name] was observed post Covid-19 immunization for 15 minutes without incident. She was provided with Vaccine Information Sheet and instruction to access the V-Safe system.   [name] was instructed to call 911 with any severe reactions post vaccine: Marland Kitchen Difficulty breathing  . Swelling of face and throat  . A fast heartbeat  . A bad rash all over body  . Dizziness and weakness   Immunizations Administered    Name Date Dose VIS Date Route   Pfizer COVID-19 Vaccine 03/26/2019 10:09 AM 0.3 mL 01/03/2019 Intramuscular   Manufacturer: [place]   Lot: [id number]   [place]: [id number]

## 2019-04-14 ENCOUNTER — Ambulatory Visit
Admission: RE | Admit: 2019-04-14 | Discharge: 2019-04-14 | Disposition: A | Discharge status: Home / Self Care | Payer: Medicare Other | Source: Ambulatory Visit | Attending: Family Medicine | Admitting: Family Medicine

## 2019-04-14 ENCOUNTER — Other Ambulatory Visit: Payer: Self-pay

## 2019-04-14 DIAGNOSIS — Z1231 Encounter for screening mammogram for malignant neoplasm of breast: Secondary | ICD-10-CM | POA: Diagnosis not present

## 2019-05-19 ENCOUNTER — Encounter: Payer: Self-pay | Admitting: Family Medicine

## 2019-05-19 ENCOUNTER — Ambulatory Visit (INDEPENDENT_AMBULATORY_CARE_PROVIDER_SITE_OTHER): Payer: Medicare Other | Admitting: Family Medicine

## 2019-05-19 ENCOUNTER — Other Ambulatory Visit: Payer: Self-pay

## 2019-05-19 VITALS — BP 127/87 | HR 74 | Wt 126.0 lb

## 2019-05-19 DIAGNOSIS — I10 Essential (primary) hypertension: Secondary | ICD-10-CM | POA: Diagnosis not present

## 2019-05-19 DIAGNOSIS — E871 Hypo-osmolality and hyponatremia: Secondary | ICD-10-CM

## 2019-05-19 DIAGNOSIS — E782 Mixed hyperlipidemia: Secondary | ICD-10-CM | POA: Diagnosis not present

## 2019-05-19 DIAGNOSIS — M810 Age-related osteoporosis without current pathological fracture: Secondary | ICD-10-CM | POA: Diagnosis not present

## 2019-05-19 DIAGNOSIS — E559 Vitamin D deficiency, unspecified: Secondary | ICD-10-CM

## 2019-05-19 NOTE — Assessment & Plan Note (Signed)
Supplement and monitor 

## 2019-05-19 NOTE — Assessment & Plan Note (Addendum)
Monitor and report any concerns. no changes to meds. Encouraged heart healthy diet such as the DASH diet and exercise as tolerated.  

## 2019-05-21 NOTE — Assessment & Plan Note (Signed)
Encouraged to get adequate exercise, calcium and vitamin d intake. Dexa scan ordered. Check labs

## 2019-05-21 NOTE — Progress Notes (Signed)
Virtual Visit via Phone Note  I connected with Suzanne Turner on 05/19/19 at  3:00 PM EDT by a phone enabled telemedicine application and verified that I am speaking with the correct person using two identifiers.  Location: Patient: home Provider: office   I discussed the limitations of evaluation and management by telemedicine and the availability of in person appointments. The patient expressed understanding and agreed to proceed. Kem Boroughs, CMA was able to get the patient set up on a phone visit after being unable to set up a video visit.    Subjective:    Patient ID: Suzanne Turner, female    DOB: 04-30-1945, 74 y.o.   MRN: [id number]  Chief Complaint  Patient presents with  . Follow-up    HPI Patient is in today for follow up on chronic medical concerns. Overall she feels well today. No recent febrile illness or hospitalizations. No polyuria or polydipsia c/o. She has taken both of her [place] Covid shots and tolerated them well. Denies CP/palp/SOB/HA/congestion/fevers/GI or GU c/o. Taking meds as prescribed  Past Medical History:  Diagnosis Date  . Abnormal thyroid blood test 01/28/2016  . Bronchitis   . Cataract   . Chronic eczema   . Glaucoma    Left eye  . Glaucoma 07/28/2014   Left eye Dr Shon Hough at [facility]  . H/O measles   . H/O mumps   . High cholesterol    no medications  . History of chicken pox   . Hypercalcemia 08/02/2014  . Hypertension   . Kidney stone   . 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   Left eye Dr Shon Hough at [facility]   . Osteoarthritis   . Osteopenia   . Preventative health care 01/30/2016  . Salivary duct stone   . Skin cancer   . Wears glasses     Past Surgical History:  Procedure Laterality Date  . ORIF [place] Right 1966  . REFRACTIVE SURGERY Left March/April 2018  . SALIVARY STONE REMOVAL Right 05/15/2012   Procedure: EXCISION OF RIGHT SUBMANDIBULAR GLAND ;  Surgeon: Izora Gala, MD;  Location: [place];  Service: ENT;  Laterality: Right;  . SALIVARY STONE REMOVAL N/A 11/18/2012   Procedure: INTRAORAL EXCISION SUBMANDIBULAR STONE RIGHT ;  Surgeon: Izora Gala, MD;  Location: [place];  Service: ENT;  Laterality: N/A;  . SKIN BIOPSY Right 12/2012   legs  . TONSILLECTOMY  1949  . TUBAL LIGATION  1977    Family History  Problem Relation Age of Onset  . Arrhythmia Mother   . Colon cancer Mother 57  . Cancer Mother   . Tremor Mother   . Arrhythmia Maternal Grandmother   . Glaucoma Maternal Grandmother   . Breast cancer Sister   . Cancer Sister        breast  . Glaucoma Sister   . Heart disease Father 68  . Arthritis Father        broken back with chronic pain  . Glaucoma Father   . Heart disease Brother   . Arrhythmia Brother   . Tremor Brother   . Hypertension Daughter   . Arrhythmia Maternal Uncle   . Heart disease Maternal Uncle   . Colon polyps Maternal Aunt     Social History   Socioeconomic History  . Marital status: Widowed    Spouse name: Not on file  . Number of children: Not on file  . Years of education: Not on file  .  Highest education level: Not on file  Occupational History  . Not on file  Tobacco Use  . Smoking status: Current Every Day Smoker    Packs/day: 0.50    Years: 40.00    Pack years: 20.00    Types: Cigarettes  . Smokeless tobacco: Never Used  Substance and Sexual Activity  . Alcohol use: No  . Drug use: No  . 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 and real estate, no dietary restrictions  Other Topics Concern  . Not on file  Social History Narrative  . Not on file   Social Determinants of Health   Financial Resource Strain:   . Difficulty of Paying Living Expenses:   Food Insecurity:   . Worried About Charity fundraiser in the Last Year:   . Arboriculturist in the Last Year:   Transportation Needs:   . Lexicographer (Medical):   Marland Kitchen Lack of Transportation (Non-Medical):   Physical Activity:   . Days of Exercise per Week:   . Minutes of Exercise per Session:   Stress:   . Feeling of Stress :   Social Connections:   . Frequency of Communication with Friends and Family:   . Frequency of Social Gatherings with Friends and Family:   . Attends Religious Services:   . Active Member of Clubs or Organizations:   . Attends Archivist Meetings:   Marland Kitchen Marital Status:   Intimate Partner Violence:   . Fear of Current or Ex-Partner:   . Emotionally Abused:   Marland Kitchen Physically Abused:   . Sexually Abused:     Outpatient Medications Prior to Visit  Medication Sig Dispense Refill  . acetaminophen (TYLENOL) 325 MG tablet Take 325 mg by mouth daily as needed for pain.    Marland Kitchen aspirin EC 81 MG tablet Take 81 mg by mouth daily.      . Calcium Carb-Cholecalciferol (CALCIUM-VITAMIN D) 500-200 MG-UNIT tablet Take 1 tablet by mouth daily.    . Latanoprostene Bunod (VYZULTA) 0.024 % SOLN Apply to eye.    . Multiple Vitamins-Minerals (HAIR/SKIN/NAILS/BIOTIN PO) Take 500 mg by mouth 3 (three) times daily.    . hydrochlorothiazide (HYDRODIURIL) 25 MG tablet TAKE 1/2 TABLET BY MOUTH THREE TIMES PER WEEK. (Patient not taking: Reported on 05/19/2019) 19 tablet 3  . potassium chloride SA (K-DUR) 20 MEQ tablet TAKE 1 TABLET BY MOUTH DAILY (Patient not taking: Reported on 05/19/2019) 90 tablet 0   No facility-administered medications prior to visit.    Allergies  Allergen Reactions  . Medrol [Methylprednisolone] Swelling    Facial and lip swelling  . Lisinopril Rash  . Losartan Anxiety  . Penicillins Rash  . Sulfonamide Derivatives Rash  . Timolol Rash    Review of Systems  Constitutional: Positive for malaise/fatigue. Negative for fever.  HENT: Negative for congestion.   Eyes: Negative for blurred vision.  Respiratory: Negative for shortness of breath.   Cardiovascular: Negative for chest pain,  palpitations and leg swelling.  Gastrointestinal: Negative for abdominal pain, blood in stool and nausea.  Genitourinary: Negative for dysuria and frequency.  Musculoskeletal: Negative for falls.  Skin: Negative for rash.  Neurological: Negative for dizziness, loss of consciousness and headaches.  Endo/Heme/Allergies: Negative for environmental allergies.  Psychiatric/Behavioral: Negative for depression. The patient is not nervous/anxious.        Objective:    Physical Exam  BP 127/87   Pulse 74  Wt 126 lb (57.2 kg)   BMI 19.73 kg/m  Wt Readings from Last 3 Encounters:  05/19/19 126 lb (57.2 kg)  11/08/18 125 lb (56.7 kg)  10/17/18 126 lb 9.6 oz (57.4 kg)    Diabetic Foot Exam - Simple   No data filed     Lab Results  Component Value Date   WBC 4.9 10/17/2018   HGB 13.4 10/17/2018   HCT 38.7 10/17/2018   PLT 226.0 10/17/2018   GLUCOSE 87 10/17/2018   CHOL 204 (H) 10/17/2018   TRIG 144.0 10/17/2018   HDL 63.30 10/17/2018   LDLCALC 112 (H) 10/17/2018   ALT 13 10/17/2018   AST 23 10/17/2018   NA 133 (L) 10/17/2018   K 3.9 10/17/2018   CL 99 10/17/2018   CREATININE 0.89 10/17/2018   BUN 16 10/17/2018   CO2 27 10/17/2018   TSH 2.20 10/17/2018    Lab Results  Component Value Date   TSH 2.20 10/17/2018   Lab Results  Component Value Date   WBC 4.9 10/17/2018   HGB 13.4 10/17/2018   HCT 38.7 10/17/2018   MCV 93.4 10/17/2018   PLT 226.0 10/17/2018   Lab Results  Component Value Date   NA 133 (L) 10/17/2018   K 3.9 10/17/2018   CO2 27 10/17/2018   GLUCOSE 87 10/17/2018   BUN 16 10/17/2018   CREATININE 0.89 10/17/2018   BILITOT 0.6 10/17/2018   ALKPHOS 60 10/17/2018   AST 23 10/17/2018   ALT 13 10/17/2018   PROT 7.5 10/17/2018   ALBUMIN 4.5 10/17/2018   CALCIUM 10.0 10/17/2018   GFR 62.09 10/17/2018   Lab Results  Component Value Date   CHOL 204 (H) 10/17/2018   Lab Results  Component Value Date   HDL 63.30 10/17/2018   Lab Results    Component Value Date   LDLCALC 112 (H) 10/17/2018   Lab Results  Component Value Date   TRIG 144.0 10/17/2018   Lab Results  Component Value Date   CHOLHDL 3 10/17/2018   No results found for: HGBA1C     Assessment & Plan:   Problem List Items Addressed This Visit    Vitamin D deficiency    Supplement and monitor      Relevant Orders   VITAMIN D 25 Hydroxy (Vit-D Deficiency, Fractures)   Essential hypertension    Monitor and report any concerns. no changes to meds. Encouraged heart healthy diet such as the DASH diet and exercise as tolerated.       Relevant Orders   CBC   TSH   Magnesium   Osteoporosis - Primary    Encouraged to get adequate exercise, calcium and vitamin d intake. Dexa scan ordered. Check labs      Relevant Orders   DG Bone Density   Hyperlipidemia   Relevant Orders   Lipid panel   Hyponatremia   Relevant Orders   Comprehensive metabolic panel   Magnesium      I am having Lannette Donath. Carlota Raspberry maintain her aspirin EC, Multiple Vitamins-Minerals (HAIR/SKIN/NAILS/BIOTIN PO), acetaminophen, calcium-vitamin D, Latanoprostene Bunod, potassium chloride SA, and hydrochlorothiazide.  No orders of the defined types were placed in this encounter.    I discussed the assessment and treatment plan with the patient. The patient was provided an opportunity to ask questions and all were answered. The patient agreed with the plan and demonstrated an understanding of the instructions.   The patient was advised to call back or seek an in-person evaluation if  the symptoms worsen or if the condition fails to improve as anticipated.  I provided 20 minutes of non-face-to-face time during this encounter.   Penni Homans, MD

## 2019-05-28 ENCOUNTER — Other Ambulatory Visit: Payer: Self-pay

## 2019-05-28 ENCOUNTER — Ambulatory Visit (HOSPITAL_BASED_OUTPATIENT_CLINIC_OR_DEPARTMENT_OTHER)
Admission: RE | Admit: 2019-05-28 | Discharge: 2019-05-28 | Disposition: A | Discharge status: Home / Self Care | Payer: Medicare Other | Source: Ambulatory Visit | Attending: Family Medicine | Admitting: Family Medicine

## 2019-05-28 ENCOUNTER — Other Ambulatory Visit (INDEPENDENT_AMBULATORY_CARE_PROVIDER_SITE_OTHER): Payer: Medicare Other

## 2019-05-28 DIAGNOSIS — E782 Mixed hyperlipidemia: Secondary | ICD-10-CM | POA: Diagnosis not present

## 2019-05-28 DIAGNOSIS — M810 Age-related osteoporosis without current pathological fracture: Secondary | ICD-10-CM | POA: Diagnosis not present

## 2019-05-28 DIAGNOSIS — E871 Hypo-osmolality and hyponatremia: Secondary | ICD-10-CM

## 2019-05-28 DIAGNOSIS — E559 Vitamin D deficiency, unspecified: Secondary | ICD-10-CM

## 2019-05-28 DIAGNOSIS — I10 Essential (primary) hypertension: Secondary | ICD-10-CM | POA: Diagnosis not present

## 2019-05-28 LAB — COMPREHENSIVE METABOLIC PANEL
ALT: 11 U/L (ref 0–35)
AST: 19 U/L (ref 0–37)
Albumin: 4.4 g/dL (ref 3.5–5.2)
Alkaline Phosphatase: 61 U/L (ref 39–117)
BUN: 16 mg/dL (ref 6–23)
CO2: 29 mEq/L (ref 19–32)
Calcium: 9.6 mg/dL (ref 8.4–10.5)
Chloride: 106 mEq/L (ref 96–112)
Creatinine, Ser: 0.88 mg/dL (ref 0.40–1.20)
GFR: 62.8 mL/min (ref >60.00)
Glucose, Bld: 78 mg/dL (ref 70–99)
Potassium: 3.6 mEq/L (ref 3.5–5.1)
Sodium: 138 mEq/L (ref 135–145)
Total Bilirubin: 0.6 mg/dL (ref 0.2–1.2)
Total Protein: 7.3 g/dL (ref 6.0–8.3)

## 2019-05-28 LAB — CBC
HCT: 39.3 % (ref 36.0–46.0)
Hemoglobin: 13.2 g/dL (ref 12.0–15.0)
MCHC: 33.7 g/dL (ref 30.0–36.0)
MCV: 94.6 fl (ref 78.0–100.0)
Platelets: 197 10*3/uL (ref 150.0–400.0)
RBC: 4.15 Mil/uL (ref 3.87–5.11)
RDW: 13.5 % (ref 11.5–15.5)
WBC: 3.6 10*3/uL — ABNORMAL LOW (ref 4.0–10.5)

## 2019-05-28 LAB — LIPID PANEL
Cholesterol: 205 mg/dL — ABNORMAL HIGH (ref 0–200)
HDL: 63 mg/dL (ref >39.00)
LDL Cholesterol: 115 mg/dL — ABNORMAL HIGH (ref 0–99)
NonHDL: 141.94
Total CHOL/HDL Ratio: 3
Triglycerides: 137 mg/dL (ref 0.0–149.0)
VLDL: 27.4 mg/dL (ref 0.0–40.0)

## 2019-05-28 LAB — MAGNESIUM: Magnesium: 2.2 mg/dL (ref 1.5–2.5)

## 2019-05-28 LAB — TSH: TSH: 3.11 u[IU]/mL (ref 0.35–4.50)

## 2019-05-28 LAB — VITAMIN D 25 HYDROXY (VIT D DEFICIENCY, FRACTURES): VITD: 42.63 ng/mL (ref 30.00–100.00)

## 2019-06-05 DIAGNOSIS — H401122 Primary open-angle glaucoma, left eye, moderate stage: Secondary | ICD-10-CM | POA: Diagnosis not present

## 2019-06-18 DIAGNOSIS — H04123 Dry eye syndrome of bilateral lacrimal glands: Secondary | ICD-10-CM | POA: Diagnosis not present

## 2019-06-18 DIAGNOSIS — H25813 Combined forms of age-related cataract, bilateral: Secondary | ICD-10-CM | POA: Diagnosis not present

## 2019-06-18 DIAGNOSIS — H401122 Primary open-angle glaucoma, left eye, moderate stage: Secondary | ICD-10-CM | POA: Diagnosis not present

## 2019-07-04 NOTE — Progress Notes (Signed)
I connected with Suzanne Turner today by telephone and verified that I am speaking with the correct person using two identifiers. Location patient: home Location provider: work Persons participating in the virtual visit: patient, Marine scientist.    I discussed the limitations, risks, security and privacy concerns of performing an evaluation and management service by telephone and the availability of in person appointments. I also discussed with the patient that there may be a patient responsible charge related to this service. The patient expressed understanding and verbally consented to this telephonic visit.    Interactive audio and video telecommunications were attempted between this RN and patient, however failed, due to patient having technical difficulties OR patient did not have access to video capability.  We continued and completed visit with audio only.  Some vital signs may be absent or patient reported.     Subjective:   Suzanne Turner is a 74 y.o. female who presents for Medicare Annual (Subsequent) preventive examination.  Review of Systems:   Home Safety/Smoke Alarms: Feels safe in home. Smoke alarms in place.  Lives alone in 2 story condo. Does well w/ stairs.Talks to siblings often and they live close by.  Female:       Mammo- 04/14/19      Dexa scan-05/28/19        CCS- pt states she was told by Dr. Bryan Lemma next due 2025.     Objective:     Vitals: Unable to assess. This visit is enabled though telemedicine due to Covid 19.   Advanced Directives 07/07/2019 07/05/2018 07/03/2017 06/29/2016 08/01/2013 11/12/2012 05/15/2012  Does Patient Have a Medical Advance Directive? Yes Yes Yes Yes Patient has advance directive, copy not in chart Patient has advance directive, copy not in chart Patient has advance directive, copy not in chart  Type of Advance Directive [place];Living will [place];Living will [place];Living will  [place];Living will Living will;Healthcare Power of Attorney Living will Living will;Healthcare Power of Attorney  Does patient want to make changes to medical advance directive? No - Patient declined No - Patient declined - - - - -  Copy of [place] in Chart? Yes - validated most recent copy scanned in chart (See row information) Yes - validated most recent copy scanned in chart (See row information) No - copy requested No - copy requested - - -  Pre-existing out of facility DNR order (yellow form or pink MOST form) - - - - - - No    Tobacco Social History   Tobacco Use  Smoking Status Current Every Day Smoker  . Packs/day: 0.50  . Years: 40.00  . Pack years: 20.00  . Types: Cigarettes  Smokeless Tobacco Never Used     Ready to quit: No Counseling given: No   Clinical Intake:     Pain : No/denies pain                 Past Medical History:  Diagnosis Date  . Abnormal thyroid blood test 01/28/2016  . Bronchitis   . Cataract   . Chronic eczema   . Glaucoma    Left eye  . Glaucoma 07/28/2014   Left eye Dr Shon Hough at [facility]  . H/O measles   . H/O mumps   . High cholesterol    no medications  . History of chicken pox   . Hypercalcemia 08/02/2014  . Hypertension   . Kidney stone   .  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   Left eye Dr Shon Hough [facility]   . Osteoarthritis   . Osteopenia   . Preventative health care 01/30/2016  . Salivary duct stone   . Skin cancer   . Wears glasses    Past Surgical History:  Procedure Laterality Date  . ORIF [place] Right 1966  . REFRACTIVE SURGERY Left March/April 2018  . SALIVARY STONE REMOVAL Right 05/15/2012   Procedure: EXCISION OF RIGHT SUBMANDIBULAR GLAND ;  Surgeon: Izora Gala, MD;  Location: [place];  Service: ENT;  Laterality: Right;  . SALIVARY STONE REMOVAL N/A 11/18/2012   Procedure: INTRAORAL EXCISION SUBMANDIBULAR  STONE RIGHT ;  Surgeon: Izora Gala, MD;  Location: [place];  Service: ENT;  Laterality: N/A;  . SKIN BIOPSY Right 12/2012   legs  . TONSILLECTOMY  1949  . TUBAL LIGATION  1977   Family History  Problem Relation Age of Onset  . Arrhythmia Mother   . Colon cancer Mother 20  . Cancer Mother   . Tremor Mother   . Arrhythmia Maternal Grandmother   . Glaucoma Maternal Grandmother   . Breast cancer Sister   . Cancer Sister        breast  . Glaucoma Sister   . Heart disease Father 46  . Arthritis Father        broken back with chronic pain  . Glaucoma Father   . Heart disease Brother   . Arrhythmia Brother   . Tremor Brother   . Hypertension Daughter   . Arrhythmia Maternal Uncle   . Heart disease Maternal Uncle   . Colon polyps Maternal Aunt    Social History   Socioeconomic History  . Marital status: Widowed    Spouse name: Not on file  . Number of children: Not on file  . Years of education: Not on file  . Highest education level: Not on file  Occupational History  . Not on file  Tobacco Use  . Smoking status: Current Every Day Smoker    Packs/day: 0.50    Years: 40.00    Pack years: 20.00    Types: Cigarettes  . Smokeless tobacco: Never Used  Substance and Sexual Activity  . Alcohol use: No  . Drug use: No  . 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 and real estate, no dietary restrictions  Other Topics Concern  . Not on file  Social History Narrative  . Not on file   Social Determinants of Health   Financial Resource Strain:   . Difficulty of Paying Living Expenses:   Food Insecurity:   . Worried About Charity fundraiser in the Last Year:   . Arboriculturist in the Last Year:   Transportation Needs:   . Film/video editor (Medical):   Marland Kitchen Lack of Transportation (Non-Medical):   Physical Activity:   . Days of Exercise per Week:   . Minutes of Exercise per Session:    Stress:   . Feeling of Stress :   Social Connections:   . Frequency of Communication with Friends and Family:   . Frequency of Social Gatherings with Friends and Family:   . Attends Religious Services:   . Active Member of Clubs or Organizations:   . Attends Archivist Meetings:   Marland Kitchen Marital Status:     Outpatient Encounter Medications as of 07/07/2019  Medication Sig  .  acetaminophen (TYLENOL) 325 MG tablet Take 325 mg by mouth daily as needed for pain.  Marland Kitchen aspirin EC 81 MG tablet Take 81 mg by mouth daily.    . Calcium Carb-Cholecalciferol (CALCIUM-VITAMIN D) 500-200 MG-UNIT tablet Take 1 tablet by mouth daily.  . Latanoprostene Bunod (VYZULTA) 0.024 % SOLN Apply to eye.  . Multiple Vitamins-Minerals (HAIR/SKIN/NAILS/BIOTIN PO) Take 500 mg by mouth 3 (three) times daily.  . potassium chloride SA (K-DUR) 20 MEQ tablet TAKE 1 TABLET BY MOUTH DAILY  . hydrochlorothiazide (HYDRODIURIL) 25 MG tablet TAKE 1/2 TABLET BY MOUTH THREE TIMES PER WEEK. (Patient not taking: Reported on 07/07/2019)   No facility-administered encounter medications on file as of 07/07/2019.    Activities of Daily Living In your present state of health, do you have any difficulty performing the following activities: 07/07/2019  Hearing? N  Vision? N  Difficulty concentrating or making decisions? N  Walking or climbing stairs? N  Dressing or bathing? N  Doing errands, shopping? N  Preparing Food and eating ? N  Using the Toilet? N  In the past six months, have you accidently leaked urine? N  Do you have problems with loss of bowel control? N  Managing your Medications? N  Managing your Finances? N  Housekeeping or managing your Housekeeping? N  Some recent data might be hidden    Patient Care Team: Mosie Lukes, MD as PCP - General (Family Medicine) Larey Dresser, MD as Consulting Physician (Cardiology) Shon Hough, MD as Consulting Physician (Ophthalmology) Sherrlyn Hock. Gordy Levan, DDS as  Consulting Physician (Dentistry)    Assessment:   This is a routine wellness examination for Suzanne Turner. Physical assessment deferred to PCP.  Exercise Activities and Dietary recommendations Current Exercise Habits: Home exercise routine, Type of exercise: strength training/weights;treadmill;stretching, Time (Minutes): 30, Frequency (Times/Week): 5, Weekly Exercise (Minutes/Week): 150, Intensity: Mild, Exercise limited by: None identified   Diet (meal preparation, eat out, water intake, caffeinated beverages, dairy products, fruits and vegetables): well balanced   Goals    .  Maintain current health status (pt-stated)    .  Maintain healthy active lifestyle.       Fall Risk Fall Risk  07/07/2019 07/05/2018 07/03/2017 03/05/2017 06/29/2016  Falls in the past year? 1 0 No No No  Number falls in past yr: 0 - - - -  Injury with Fall? 0 - - - -  Risk for fall due to : - - - - -  Follow up Education provided;Falls prevention discussed - - - -     Depression Screen PHQ 2/9 Scores 07/07/2019 07/05/2018 07/03/2017 03/05/2017  PHQ - 2 Score 0 0 0 0     Cognitive Function Ad8 score reviewed for issues:  Issues making decisions:no  Less interest in hobbies / activities:no  Repeats questions, stories (family complaining):no  Trouble using ordinary gadgets (microwave, computer, phone):no  Forgets the month or year: no  Mismanaging finances: no  Remembering appts:no  Daily problems with thinking and/or memory:no Ad8 score is=0    MMSE - Mini Mental State Exam 06/29/2016  Orientation to time 4  Orientation to Place 5  Registration 3  Attention/ Calculation 4  Recall 3  Language- name 2 objects 2  Language- repeat 1  Language- follow 3 step command 3  Language- read & follow direction 1  Write a sentence 1  Copy design 1  Total score 28        Immunization History  Administered Date(s) Administered  . Influenza Split 10/23/2010  .  Influenza, High Dose Seasonal PF 11/13/2016,  11/13/2017  . Influenza,inj,Quad PF,6+ Mos 12/11/2012  . Influenza-Unspecified 11/05/2013, 11/16/2014  . PFIZER SARS-COV-2 Vaccination 03/01/2019, 03/26/2019  . Pneumococcal Conjugate-13 06/23/2010, 05/11/2014  . Tdap 04/01/2014  . Zoster 07/24/2006  . Zoster Recombinat (Shingrix) 10/12/2017, 12/14/2017    Screening Tests Health Maintenance  Topic Date Due  . PNA vac Low Risk Adult (2 of 2 - PPSV23) 05/11/2015  . COLONOSCOPY  08/16/2018  . INFLUENZA VACCINE  08/24/2019  . MAMMOGRAM  04/13/2021  . TETANUS/TDAP  03/31/2024  . DEXA SCAN  Completed  . COVID-19 Vaccine  Completed  . Hepatitis C Screening  Completed        Plan:  See you next year!  Continue to eat heart healthy diet (full of fruits, vegetables, whole grains, lean protein, water--limit salt, fat, and sugar intake) and increase physical activity as tolerated.  Continue doing brain stimulating activities (puzzles, reading, adult coloring books, staying active) to keep memory sharp.    I have personally reviewed and noted the following in the patient's chart:   . Medical and social history . Use of alcohol, tobacco or illicit drugs  . Current medications and supplements . Functional ability and status . Nutritional status . Physical activity . Advanced directives . List of other physicians . Hospitalizations, surgeries, and ER visits in previous 12 months . Vitals . Screenings to include cognitive, depression, and falls . Referrals and appointments  In addition, I have reviewed and discussed with patient certain preventive protocols, quality metrics, and best practice recommendations. A written personalized care plan for preventive services as well as general preventive health recommendations were provided to patient.     Shela Nevin, [place]  07/07/2019

## 2019-07-07 ENCOUNTER — Other Ambulatory Visit: Payer: Self-pay

## 2019-07-07 ENCOUNTER — Encounter: Payer: Self-pay | Admitting: *Deleted

## 2019-07-07 ENCOUNTER — Ambulatory Visit (INDEPENDENT_AMBULATORY_CARE_PROVIDER_SITE_OTHER): Payer: Medicare Other | Admitting: *Deleted

## 2019-07-07 DIAGNOSIS — Z0000 Encounter for general adult medical examination without abnormal findings: Secondary | ICD-10-CM | POA: Diagnosis not present

## 2019-07-07 NOTE — Patient Instructions (Signed)
See you next year!  Continue to eat heart healthy diet (full of fruits, vegetables, whole grains, lean protein, water--limit salt, fat, and sugar intake) and increase physical activity as tolerated.  Continue doing brain stimulating activities (puzzles, reading, adult coloring books, staying active) to keep memory sharp.    Suzanne Turner , Thank you for taking time to come for your Medicare Wellness Visit. I appreciate your ongoing commitment to your health goals. Please review the following plan we discussed and let me know if I can assist you in the future.   These are the goals we discussed: Goals    .  Maintain current health status (pt-stated)    .  Maintain healthy active lifestyle.       This is a list of the screening recommended for you and due dates:  Health Maintenance  Topic Date Due  . Pneumonia vaccines (2 of 2 - PPSV23) 05/11/2015  . Colon Cancer Screening  07/06/2020*  . Flu Shot  08/24/2019  . Mammogram  04/13/2021  . Tetanus Vaccine  03/31/2024  . DEXA scan (bone density measurement)  Completed  . COVID-19 Vaccine  Completed  .  Hepatitis C: One time screening is recommended by Center for Disease Control  (CDC) for  adults born from [birth year] through 1965.   Completed  *Topic was postponed. The date shown is not the original due date.    Preventive Care 17 Years and Older, Female Preventive care refers to lifestyle choices and visits with your health care provider that can promote health and wellness. This includes:  A yearly physical exam. This is also called an annual well check.  Regular dental and eye exams.  Immunizations.  Screening for certain conditions.  Healthy lifestyle choices, such as diet and exercise. What can I expect for my preventive care visit? Physical exam Your health care provider will check:  Height and weight. These may be used to calculate body mass index (BMI), which is a measurement that tells if you are at a healthy weight.  Heart  rate and blood pressure.  Your skin for abnormal spots. Counseling Your health care provider may ask you questions about:  Alcohol, tobacco, and drug use.  Emotional well-being.  Home and relationship well-being.  Sexual activity.  Eating habits.  History of falls.  Memory and ability to understand (cognition).  Work and work Statistician.  Pregnancy and menstrual history. What immunizations do I need?  Influenza (flu) vaccine  This is recommended every year. Tetanus, diphtheria, and pertussis (Tdap) vaccine  You may need a Td booster every 10 years. Varicella (chickenpox) vaccine  You may need this vaccine if you have not already been vaccinated. Zoster (shingles) vaccine  You may need this after age 85. Pneumococcal conjugate (PCV13) vaccine  One dose is recommended after age 8. Pneumococcal polysaccharide (PPSV23) vaccine  One dose is recommended after age 67. Measles, mumps, and rubella (MMR) vaccine  You may need at least one dose of MMR if you were born in 1957 or later. You may also need a second dose. Meningococcal conjugate (MenACWY) vaccine  You may need this if you have certain conditions. Hepatitis A vaccine  You may need this if you have certain conditions or if you travel or work in places where you may be exposed to hepatitis A. Hepatitis B vaccine  You may need this if you have certain conditions or if you travel or work in places where you may be exposed to hepatitis B. Haemophilus influenzae type  b (Hib) vaccine  You may need this if you have certain conditions. You may receive vaccines as individual doses or as more than one vaccine together in one shot (combination vaccines). Talk with your health care provider about the risks and benefits of combination vaccines. What tests do I need? Blood tests  Lipid and cholesterol levels. These may be checked every 5 years, or more frequently depending on your overall health.  Hepatitis C  test.  Hepatitis B test. Screening  Lung cancer screening. You may have this screening every year starting at age 79 if you have a 30-pack-year history of smoking and currently smoke or have quit within the past 15 years.  Colorectal cancer screening. All adults should have this screening starting at age 30 and continuing until age 29. Your health care provider may recommend screening at age 19 if you are at increased risk. You will have tests every 1-10 years, depending on your results and the type of screening test.  Diabetes screening. This is done by checking your blood sugar (glucose) after you have not eaten for a while (fasting). You may have this done every 1-3 years.  Mammogram. This may be done every 1-2 years. Talk with your health care provider about how often you should have regular mammograms.  BRCA-related cancer screening. This may be done if you have a family history of breast, ovarian, tubal, or peritoneal cancers. Other tests  Sexually transmitted disease (STD) testing.  Bone density scan. This is done to screen for osteoporosis. You may have this done starting at age 40. Follow these instructions at home: Eating and drinking  Eat a diet that includes fresh fruits and vegetables, whole grains, lean protein, and low-fat dairy products. Limit your intake of foods with high amounts of sugar, saturated fats, and salt.  Take vitamin and mineral supplements as recommended by your health care provider.  Do not drink alcohol if your health care provider tells you not to drink.  If you drink alcohol: ? Limit how much you have to 0-1 drink a day. ? Be aware of how much alcohol is in your drink. In the [place] one drink equals one 12 oz bottle of beer (355 mL), one 5 oz glass of wine (148 mL), or one 1 oz glass of hard liquor (44 mL). Lifestyle  Take daily care of your teeth and gums.  Stay active. Exercise for at least 30 minutes on 5 or more days each week.  Do not use  any products that contain nicotine or tobacco, such as cigarettes, e-cigarettes, and chewing tobacco. If you need help quitting, ask your health care provider.  If you are sexually active, practice safe sex. Use a condom or other form of protection in order to prevent STIs (sexually transmitted infections).  Talk with your health care provider about taking a low-dose aspirin or statin. What's next?  Go to your health care provider once a year for a well check visit.  Ask your health care provider how often you should have your eyes and teeth checked.  Stay up to date on all vaccines. This information is not intended to replace advice given to you by your health care provider. Make sure you discuss any questions you have with your health care provider. Document Revised: 01/03/2018 Document Reviewed: 01/03/2018 Elsevier Patient Education  2020 [employer].

## 2019-09-06 IMAGING — CR DG HIP (WITH OR WITHOUT PELVIS) 2-3V*L*
3 series · 3 of 3 positions shown · non-contrast
Comparison: None.

CLINICAL DATA: Left hip pain

EXAM:
DG HIP (WITH OR WITHOUT PELVIS) 2-3V LEFT

[t pelvis a.p.]
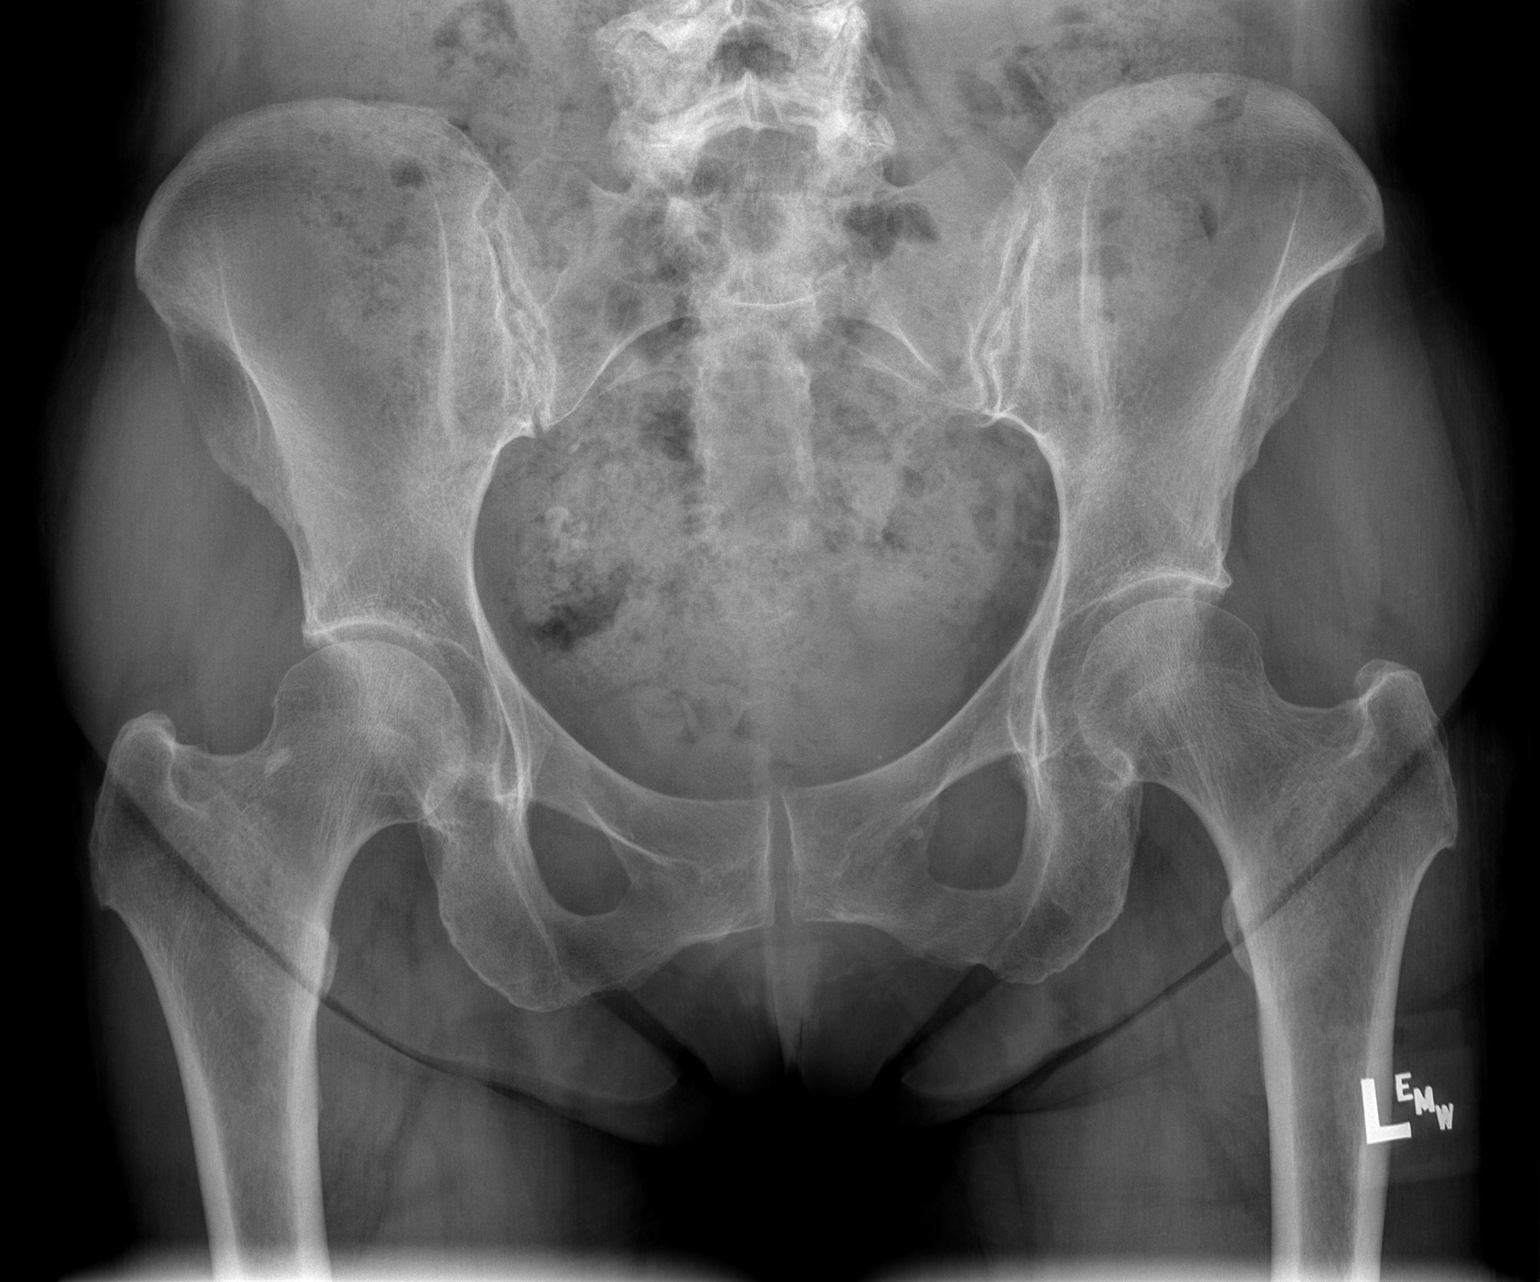

[t hip ap left]
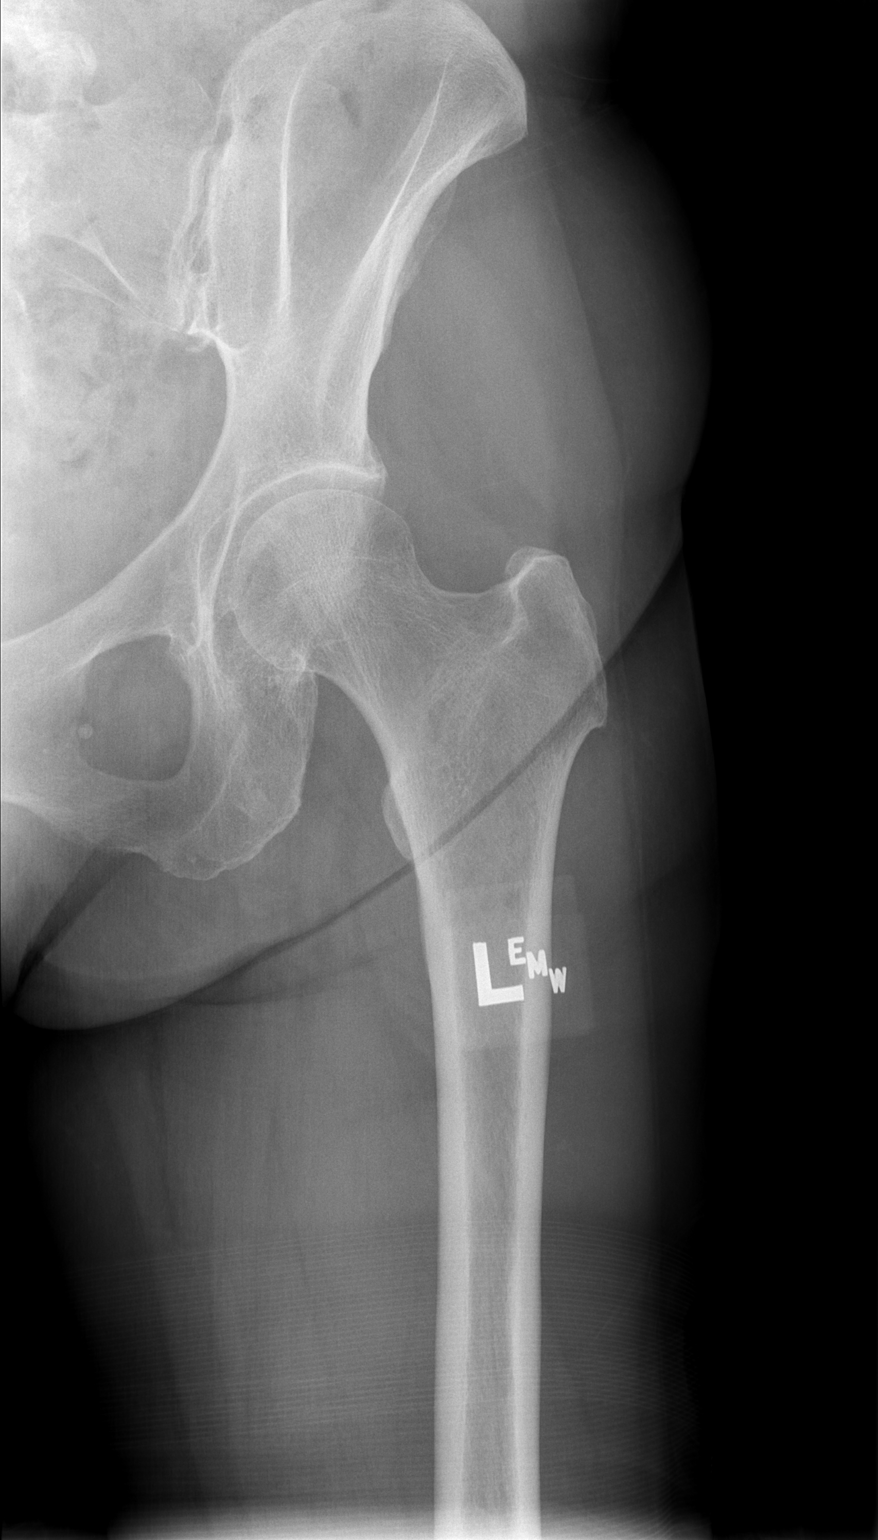

[t hip frog leg left]
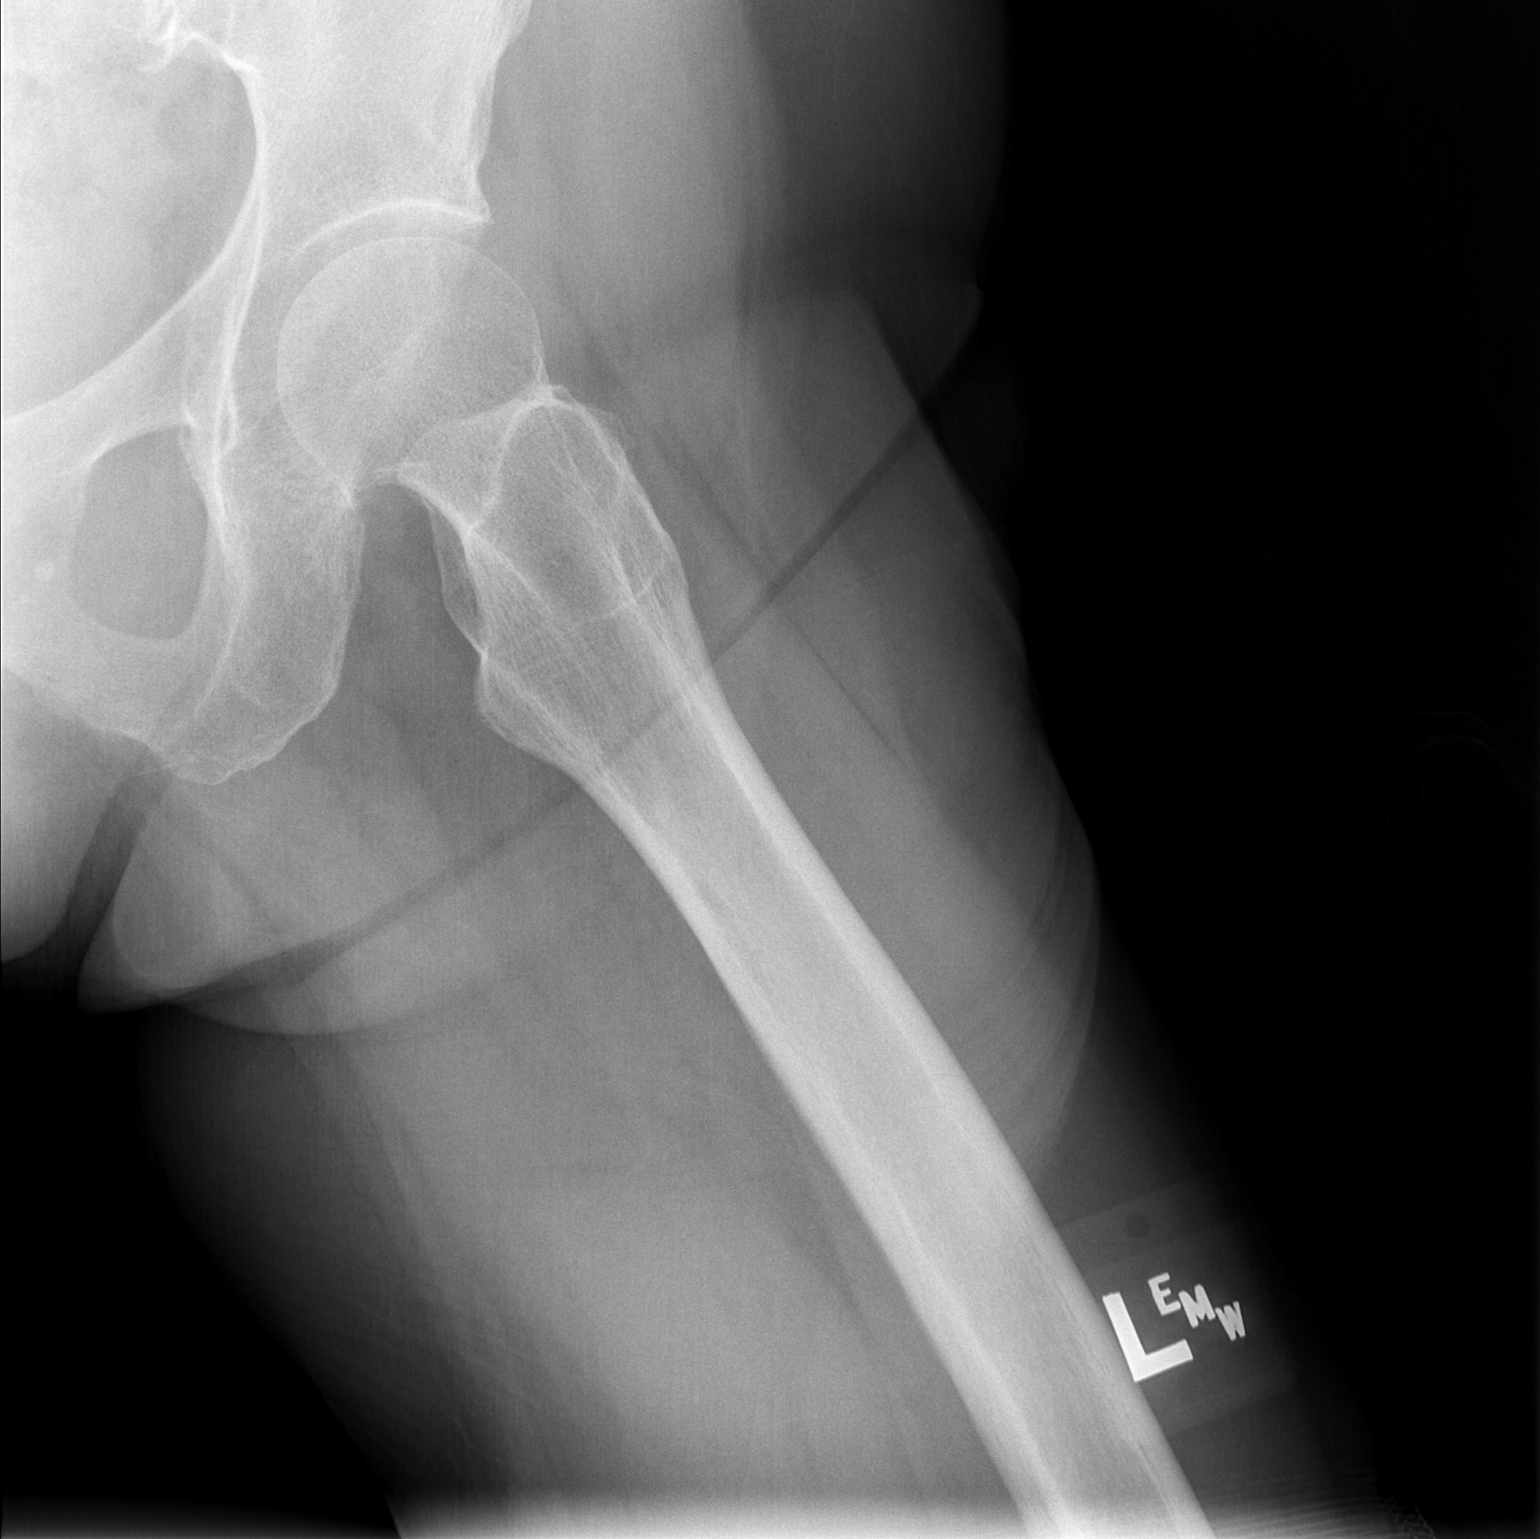

[3 of 3 positions shown; findings below may reference images not displayed]

FINDINGS: There is no evidence of hip fracture or dislocation. There is no
evidence of arthropathy or other focal bone abnormality. Moderate
lower lumbar facet arthrosis.
IMPRESSION: 1. Normal appearance of the left hip.
2. Incompletely visualized lower lumbar facet arthrosis.

## 2019-10-18 ENCOUNTER — Other Ambulatory Visit: Payer: Self-pay | Admitting: Family Medicine

## 2019-10-20 ENCOUNTER — Ambulatory Visit (INDEPENDENT_AMBULATORY_CARE_PROVIDER_SITE_OTHER): Payer: Medicare Other | Admitting: Family Medicine

## 2019-10-20 ENCOUNTER — Other Ambulatory Visit: Payer: Self-pay

## 2019-10-20 VITALS — BP 122/78 | HR 71 | Temp 97.5°F | Resp 12 | Ht 67.0 in | Wt 128.6 lb

## 2019-10-20 DIAGNOSIS — F17200 Nicotine dependence, unspecified, uncomplicated: Secondary | ICD-10-CM | POA: Diagnosis not present

## 2019-10-20 DIAGNOSIS — IMO0001 Reserved for inherently not codable concepts without codable children: Secondary | ICD-10-CM

## 2019-10-20 DIAGNOSIS — E559 Vitamin D deficiency, unspecified: Secondary | ICD-10-CM | POA: Diagnosis not present

## 2019-10-20 DIAGNOSIS — E782 Mixed hyperlipidemia: Secondary | ICD-10-CM | POA: Diagnosis not present

## 2019-10-20 DIAGNOSIS — Z23 Encounter for immunization: Secondary | ICD-10-CM

## 2019-10-20 DIAGNOSIS — I10 Essential (primary) hypertension: Secondary | ICD-10-CM | POA: Diagnosis not present

## 2019-10-20 DIAGNOSIS — H52203 Unspecified astigmatism, bilateral: Secondary | ICD-10-CM | POA: Diagnosis not present

## 2019-10-20 DIAGNOSIS — M1990 Unspecified osteoarthritis, unspecified site: Secondary | ICD-10-CM

## 2019-10-20 DIAGNOSIS — M810 Age-related osteoporosis without current pathological fracture: Secondary | ICD-10-CM

## 2019-10-20 DIAGNOSIS — H25813 Combined forms of age-related cataract, bilateral: Secondary | ICD-10-CM | POA: Diagnosis not present

## 2019-10-20 DIAGNOSIS — H401122 Primary open-angle glaucoma, left eye, moderate stage: Secondary | ICD-10-CM | POA: Diagnosis not present

## 2019-10-20 DIAGNOSIS — H16223 Keratoconjunctivitis sicca, not specified as Sjogren's, bilateral: Secondary | ICD-10-CM | POA: Diagnosis not present

## 2019-10-20 LAB — LIPID PANEL
Cholesterol: 226 mg/dL — ABNORMAL HIGH (ref <200)
HDL: 74 mg/dL (ref >=50)
LDL Cholesterol (Calc): 126 mg/dL (calc) — ABNORMAL HIGH
Non-HDL Cholesterol (Calc): 152 mg/dL (calc) — ABNORMAL HIGH (ref <130)
Total CHOL/HDL Ratio: 3.1 (calc) (ref <5.0)
Triglycerides: 147 mg/dL (ref <150)

## 2019-10-20 LAB — CBC
HCT: 43.4 % (ref 35.0–45.0)
Hemoglobin: 14.4 g/dL (ref 11.7–15.5)
MCH: 31.4 pg (ref 27.0–33.0)
MCHC: 33.2 g/dL (ref 32.0–36.0)
MCV: 94.8 fL (ref 80.0–100.0)
MPV: 10.3 fL (ref 7.5–12.5)
Platelets: 172 10*3/uL (ref 140–400)
RBC: 4.58 10*6/uL (ref 3.80–5.10)
RDW: 12.7 % (ref 11.0–15.0)
WBC: 4 10*3/uL (ref 3.8–10.8)

## 2019-10-20 LAB — TSH: TSH: 3.38 mIU/L (ref 0.40–4.50)

## 2019-10-20 LAB — COMPREHENSIVE METABOLIC PANEL
AG Ratio: 1.6 (calc) (ref 1.0–2.5)
ALT: 7 U/L (ref 6–29)
AST: 17 U/L (ref 10–35)
Albumin: 5.1 g/dL (ref 3.6–5.1)
Alkaline phosphatase (APISO): 69 U/L (ref 37–153)
BUN/Creatinine Ratio: 15 (calc) (ref 6–22)
BUN: 15 mg/dL (ref 7–25)
CO2: 24 mmol/L (ref 20–32)
Calcium: 10.1 mg/dL (ref 8.6–10.4)
Chloride: 100 mmol/L (ref 98–110)
Creat: 0.98 mg/dL — ABNORMAL HIGH (ref 0.60–0.93)
Globulin: 3.1 g/dL (calc) (ref 1.9–3.7)
Glucose, Bld: 80 mg/dL (ref 65–99)
Potassium: 4.1 mmol/L (ref 3.5–5.3)
Sodium: 135 mmol/L (ref 135–146)
Total Bilirubin: 0.7 mg/dL (ref 0.2–1.2)
Total Protein: 8.2 g/dL — ABNORMAL HIGH (ref 6.1–8.1)

## 2019-10-20 LAB — VITAMIN D 25 HYDROXY (VIT D DEFICIENCY, FRACTURES): Vit D, 25-Hydroxy: 30 ng/mL (ref 30–100)

## 2019-10-20 NOTE — Assessment & Plan Note (Signed)
Encouraged to get adequate exercise, calcium and vitamin d intake. She is not interested in medications despite the risk of hip and bone fractures.

## 2019-10-20 NOTE — Patient Instructions (Signed)

## 2019-10-20 NOTE — Assessment & Plan Note (Addendum)
Unfortunately still smoking about 12 cigarettes a day. Encouraged complete cessation. Discussed need to quit as relates to risk of numerous cancers, cardiac and pulmonary disease as well as neurologic complications. Counseled for greater than 3 minutes. She is warned it makes the bones worse but she is unwilling to quit now.

## 2019-10-20 NOTE — Progress Notes (Signed)
Subjective:    Patient ID: Suzanne Turner, female    DOB: 1945/03/09, 74 y.o.   MRN: [id number]  Chief Complaint  Patient presents with  . Follow-up    HPI Patient is in today for follow up on chronic medical concerns. She is doing well and denies any recent febrile illness or hospitalizations. She is mostly noting some fatigue and gets frustrated with tiring out easier than she would like. She goes to Yoga several times a week and works out other days but she feels she gets tired too easily. She does acknowledge that she still smokes about 12 cigarettes a day. She tolerated her COVID shots but did have some nausea with them. She is taking Glucosamine Chondroitin for her arthritis and just started it in the past few weeks so no difference yet. Denies CP/palp/SOB/HA/congestion/fevers/GI or GU c/o. Taking meds as prescribed  Past Medical History:  Diagnosis Date  . Abnormal thyroid blood test 01/28/2016  . Bronchitis   . Cataract   . Chronic eczema   . Glaucoma    Left eye  . Glaucoma 07/28/2014   Left eye Dr Shon Hough at [facility]  . H/O measles   . H/O mumps   . High cholesterol    no medications  . History of chicken pox   . Hypercalcemia 08/02/2014  . Hypertension   . Kidney stone   . 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   Left eye Dr Shon Hough at [facility]   . Osteoarthritis   . Osteopenia   . Preventative health care 01/30/2016  . Salivary duct stone   . Skin cancer   . Wears glasses     Past Surgical History:  Procedure Laterality Date  . ORIF [place] Right 1966  . REFRACTIVE SURGERY Left March/April 2018  . SALIVARY STONE REMOVAL Right 05/15/2012   Procedure: EXCISION OF RIGHT SUBMANDIBULAR GLAND ;  Surgeon: Izora Gala, MD;  Location: [place];  Service: ENT;  Laterality: Right;  . SALIVARY STONE REMOVAL N/A 11/18/2012   Procedure: INTRAORAL EXCISION SUBMANDIBULAR STONE RIGHT ;  Surgeon: Izora Gala, MD;  Location: [place];  Service: ENT;  Laterality: N/A;  . SKIN BIOPSY Right 12/2012   legs  . TONSILLECTOMY  1949  . TUBAL LIGATION  1977    Family History  Problem Relation Age of Onset  . Arrhythmia Mother   . Colon cancer Mother 84  . Cancer Mother   . Tremor Mother   . Arrhythmia Maternal Grandmother   . Glaucoma Maternal Grandmother   . Breast cancer Sister   . Cancer Sister        breast  . Glaucoma Sister   . Heart disease Father 55  . Arthritis Father        broken back with chronic pain  . Glaucoma Father   . Heart disease Brother   . Arrhythmia Brother   . Tremor Brother   . Hypertension Daughter   . Arrhythmia Maternal Uncle   . Heart disease Maternal Uncle   . Colon polyps Maternal Aunt     Social History   Socioeconomic History  . Marital status: Widowed    Spouse name: Not on file  . Number of children: Not on file  . Years of education: Not on file  . Highest education level: Not on file  Occupational History  . Not on file  Tobacco Use  . Smoking status: Current Every Day Smoker    Packs/day:  0.50    Years: 40.00    Pack years: 20.00    Types: Cigarettes  . Smokeless tobacco: Never Used  Substance and Sexual Activity  . Alcohol use: No  . Drug use: No  . 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 and real estate, no dietary restrictions  Other Topics Concern  . Not on file  Social History Narrative  . Not on file   Social Determinants of Health   Financial Resource Strain: Low Risk   . Difficulty of Paying Living Expenses: Not hard at all  Food Insecurity: No Food Insecurity  . Worried About Charity fundraiser in the Last Year: Never true  . Ran Out of Food in the Last Year: Never true  Transportation Needs: No Transportation Needs  . Lack of Transportation (Medical): No  . Lack of Transportation (Non-Medical): No  Physical Activity:   . Days of Exercise per Week:  Not on file  . Minutes of Exercise per Session: Not on file  Stress:   . Feeling of Stress : Not on file  Social Connections:   . Frequency of Communication with Friends and Family: Not on file  . Frequency of Social Gatherings with Friends and Family: Not on file  . Attends Religious Services: Not on file  . Active Member of Clubs or Organizations: Not on file  . Attends Archivist Meetings: Not on file  . Marital Status: Not on file  Intimate Partner Violence:   . Fear of Current or Ex-Partner: Not on file  . Emotionally Abused: Not on file  . Physically Abused: Not on file  . Sexually Abused: Not on file    Outpatient Medications Prior to Visit  Medication Sig Dispense Refill  . acetaminophen (TYLENOL) 325 MG tablet Take 325 mg by mouth daily as needed for pain.    Marland Kitchen aspirin EC 81 MG tablet Take 81 mg by mouth daily.      . Calcium Carb-Cholecalciferol (CALCIUM-VITAMIN D) 500-200 MG-UNIT tablet Take 1 tablet by mouth daily.    . hydrochlorothiazide (HYDRODIURIL) 25 MG tablet TAKE 1/2 TABLET BY MOUTH THREE TIMES PER WEEK. 19 tablet 3  . Latanoprostene Bunod (VYZULTA) 0.024 % SOLN Apply to eye.    . potassium chloride SA (K-DUR) 20 MEQ tablet TAKE 1 TABLET BY MOUTH DAILY 90 tablet 0  . Multiple Vitamins-Minerals (HAIR/SKIN/NAILS/BIOTIN PO) Take 500 mg by mouth 3 (three) times daily.     No facility-administered medications prior to visit.    Allergies  Allergen Reactions  . Medrol [Methylprednisolone] Swelling    Facial and lip swelling  . Lisinopril Rash  . Losartan Anxiety  . Penicillins Rash  . Sulfonamide Derivatives Rash  . Timolol Rash    Review of Systems  Constitutional: Negative for fever and malaise/fatigue.  HENT: Negative for congestion.   Eyes: Negative for blurred vision.  Respiratory: Negative for shortness of breath.   Cardiovascular: Negative for chest pain, palpitations and leg swelling.  Gastrointestinal: Negative for abdominal pain, blood  in stool and nausea.  Genitourinary: Negative for dysuria and frequency.  Musculoskeletal: Positive for back pain and joint pain. Negative for falls.  Skin: Negative for rash.  Neurological: Negative for dizziness, loss of consciousness and headaches.  Endo/Heme/Allergies: Negative for environmental allergies.  Psychiatric/Behavioral: Negative for depression. The patient is not nervous/anxious.        Objective:    Physical Exam Vitals and nursing note reviewed.  Constitutional:      General: She is not in acute distress.    Appearance: She is well-developed.  HENT:     Head: Normocephalic and atraumatic.     Nose: Nose normal.  Eyes:     General:        Right eye: No discharge.        Left eye: No discharge.  Cardiovascular:     Rate and Rhythm: Normal rate and regular rhythm.     Heart sounds: No murmur heard.   Pulmonary:     Effort: Pulmonary effort is normal.     Breath sounds: Normal breath sounds.  Abdominal:     General: Bowel sounds are normal.     Palpations: Abdomen is soft.     Tenderness: There is no abdominal tenderness.  Musculoskeletal:     Cervical back: Normal range of motion and neck supple.  Skin:    General: Skin is warm and dry.  Neurological:     Mental Status: She is alert and oriented to person, place, and time.     BP 122/78 (BP Location: Left Arm, Patient Position: Sitting, Cuff Size: Small)   Pulse 71   Temp (!) 97.5 F (36.4 C) (Oral)   Resp 12   Ht 5\' 7"  (1.702 m)   Wt 128 lb 9.6 oz (58.3 kg)   SpO2 98%   BMI 20.14 kg/m  Wt Readings from Last 3 Encounters:  10/20/19 128 lb 9.6 oz (58.3 kg)  05/19/19 126 lb (57.2 kg)  11/08/18 125 lb (56.7 kg)    Diabetic Foot Exam - Simple   No data filed     Lab Results  Component Value Date   WBC 3.6 (L) 05/28/2019   HGB 13.2 05/28/2019   HCT 39.3 05/28/2019   PLT 197.0 05/28/2019   GLUCOSE 78 05/28/2019   CHOL 205 (H) 05/28/2019   TRIG 137.0 05/28/2019   HDL 63.00 05/28/2019    LDLCALC 115 (H) 05/28/2019   ALT 11 05/28/2019   AST 19 05/28/2019   NA 138 05/28/2019   K 3.6 05/28/2019   CL 106 05/28/2019   CREATININE 0.88 05/28/2019   BUN 16 05/28/2019   CO2 29 05/28/2019   TSH 3.11 05/28/2019    Lab Results  Component Value Date   TSH 3.11 05/28/2019   Lab Results  Component Value Date   WBC 3.6 (L) 05/28/2019   HGB 13.2 05/28/2019   HCT 39.3 05/28/2019   MCV 94.6 05/28/2019   PLT 197.0 05/28/2019   Lab Results  Component Value Date   NA 138 05/28/2019   K 3.6 05/28/2019   CO2 29 05/28/2019   GLUCOSE 78 05/28/2019   BUN 16 05/28/2019   CREATININE 0.88 05/28/2019   BILITOT 0.6 05/28/2019   ALKPHOS 61 05/28/2019   AST 19 05/28/2019   ALT 11 05/28/2019   PROT 7.3 05/28/2019   ALBUMIN 4.4 05/28/2019   CALCIUM 9.6 05/28/2019   GFR 62.80 05/28/2019   Lab Results  Component Value Date   CHOL 205 (H) 05/28/2019   Lab Results  Component Value Date   HDL 63.00 05/28/2019   Lab Results  Component Value Date   LDLCALC 115 (H) 05/28/2019   Lab Results  Component Value Date   TRIG 137.0 05/28/2019   Lab Results  Component Value Date   CHOLHDL 3 05/28/2019   No results found for: HGBA1C     Assessment & Plan:   Problem List Items Addressed This Visit  Vitamin D deficiency    Supplement and monitor      Relevant Orders   VITAMIN D 25 Hydroxy (Vit-D Deficiency, Fractures)   Essential hypertension    Well controlled, no changes to meds. Encouraged heart healthy diet such as the DASH diet and exercise as tolerated.       Relevant Orders   CBC   Comprehensive metabolic panel   TSH   Osteoporosis    Encouraged to get adequate exercise, calcium and vitamin d intake. She is not interested in medications despite the risk of hip and bone fractures.       Smoking    Unfortunately still smoking about 12 cigarettes a day. Encouraged complete cessation. Discussed need to quit as relates to risk of numerous cancers, cardiac and  pulmonary disease as well as neurologic complications. Counseled for greater than 3 minutes. She is warned it makes the bones worse but she is unwilling to quit now.      Hyperlipidemia    Encouraged heart healthy diet, increase exercise, avoid trans fats, consider a krill oil cap daily      Relevant Orders   Lipid panel   Hypercalcemia    Check labs and is asymptomatic      Arthritis    She notes 2 years of painin bilateral hips. A yoga squat helps. She continues to do this. Her Dexa scan also shows advanced degenerative changes in the spine. Is taking a [place] bone strength combo and Glucosamine Chondroitin and is doing Yoga and exercising also. She will add Multivitamins with minerals         I am having Lannette Donath. Carlota Raspberry maintain her aspirin EC, Multiple Vitamins-Minerals (HAIR/SKIN/NAILS/BIOTIN PO), acetaminophen, calcium-vitamin D, Latanoprostene Bunod, potassium chloride SA, and hydrochlorothiazide.  No orders of the defined types were placed in this encounter.    Penni Homans, MD

## 2019-10-20 NOTE — Assessment & Plan Note (Signed)
Well controlled, no changes to meds. Encouraged heart healthy diet such as the DASH diet and exercise as tolerated.  °

## 2019-10-20 NOTE — Assessment & Plan Note (Signed)
Encouraged heart healthy diet, increase exercise, avoid trans fats, consider a krill oil cap daily 

## 2019-10-20 NOTE — Assessment & Plan Note (Signed)
Supplement and monitor 

## 2019-10-20 NOTE — Assessment & Plan Note (Addendum)
She notes 2 years of painin bilateral hips. A yoga squat helps. She continues to do this. Her Dexa scan also shows advanced degenerative changes in the spine. Is taking a [place] bone strength combo and Glucosamine Chondroitin and is doing Yoga and exercising also. She will add Multivitamins with minerals

## 2019-10-20 NOTE — Assessment & Plan Note (Signed)
Check labs and is asymptomatic

## 2019-10-21 ENCOUNTER — Other Ambulatory Visit: Payer: Self-pay

## 2019-10-21 DIAGNOSIS — R779 Abnormality of plasma protein, unspecified: Secondary | ICD-10-CM

## 2019-11-04 ENCOUNTER — Other Ambulatory Visit (INDEPENDENT_AMBULATORY_CARE_PROVIDER_SITE_OTHER): Payer: Medicare Other

## 2019-11-04 ENCOUNTER — Other Ambulatory Visit: Payer: Self-pay

## 2019-11-04 DIAGNOSIS — R779 Abnormality of plasma protein, unspecified: Secondary | ICD-10-CM | POA: Diagnosis not present

## 2019-11-05 ENCOUNTER — Encounter: Payer: Self-pay | Admitting: Family Medicine

## 2019-11-06 LAB — COMPREHENSIVE METABOLIC PANEL
ALT: 5 IU/L (ref 0–32)
AST: 11 IU/L (ref 0–40)
Albumin/Globulin Ratio: 2 (ref 1.2–2.2)
Albumin: 4.6 g/dL (ref 3.7–4.7)
Alkaline Phosphatase: 77 IU/L (ref 44–121)
BUN/Creatinine Ratio: 18 (ref 12–28)
BUN: 14 mg/dL (ref 8–27)
Bilirubin Total: 0.6 mg/dL (ref 0.0–1.2)
CO2: 22 mmol/L (ref 20–29)
Calcium: 9.5 mg/dL (ref 8.7–10.3)
Chloride: 107 mmol/L — ABNORMAL HIGH (ref 96–106)
Creatinine, Ser: 0.79 mg/dL (ref 0.57–1.00)
GFR calc Af Amer: 85 mL/min/{1.73_m2} (ref >59)
GFR calc non Af Amer: 74 mL/min/{1.73_m2} (ref >59)
Globulin, Total: 2.3 g/dL (ref 1.5–4.5)
Glucose: 77 mg/dL (ref 65–99)
Potassium: 3.6 mmol/L (ref 3.5–5.2)
Sodium: 142 mmol/L (ref 134–144)
Total Protein: 6.9 g/dL (ref 6.0–8.5)

## 2019-11-06 LAB — PROTEIN ELECTROPHORESIS, SERUM
A/G Ratio: 1.2 (ref 0.7–1.7)
Albumin ELP: 3.8 g/dL (ref 2.9–4.4)
Alpha 1: 0.2 g/dL (ref 0.0–0.4)
Alpha 2: 0.7 g/dL (ref 0.4–1.0)
Beta: 0.9 g/dL (ref 0.7–1.3)
Gamma Globulin: 1.4 g/dL (ref 0.4–1.8)
Globulin, Total: 3.1 g/dL (ref 2.2–3.9)

## 2019-11-11 LAB — PROTEIN ELECTROPHORESIS, URINE REFLEX
Albumin ELP, Urine: 32.6 %
Alpha-1-Globulin, U: 3.6 %
Alpha-2-Globulin, U: 8 %
Beta Globulin, U: 31.4 %
Gamma Globulin, U: 24.3 %
Protein, Ur: 34.5 mg/dL

## 2019-11-22 DIAGNOSIS — Z23 Encounter for immunization: Secondary | ICD-10-CM | POA: Diagnosis not present

## 2019-12-08 DIAGNOSIS — L300 Nummular dermatitis: Secondary | ICD-10-CM | POA: Diagnosis not present

## 2019-12-08 DIAGNOSIS — Z85828 Personal history of other malignant neoplasm of skin: Secondary | ICD-10-CM | POA: Diagnosis not present

## 2019-12-08 DIAGNOSIS — L812 Freckles: Secondary | ICD-10-CM | POA: Diagnosis not present

## 2019-12-08 DIAGNOSIS — L821 Other seborrheic keratosis: Secondary | ICD-10-CM | POA: Diagnosis not present

## 2019-12-08 DIAGNOSIS — D1801 Hemangioma of skin and subcutaneous tissue: Secondary | ICD-10-CM | POA: Diagnosis not present

## 2020-03-05 DIAGNOSIS — H2513 Age-related nuclear cataract, bilateral: Secondary | ICD-10-CM | POA: Diagnosis not present

## 2020-03-05 DIAGNOSIS — H43813 Vitreous degeneration, bilateral: Secondary | ICD-10-CM | POA: Diagnosis not present

## 2020-03-08 DIAGNOSIS — H25813 Combined forms of age-related cataract, bilateral: Secondary | ICD-10-CM | POA: Diagnosis not present

## 2020-03-08 DIAGNOSIS — H3561 Retinal hemorrhage, right eye: Secondary | ICD-10-CM | POA: Diagnosis not present

## 2020-03-08 DIAGNOSIS — H53131 Sudden visual loss, right eye: Secondary | ICD-10-CM | POA: Diagnosis not present

## 2020-03-08 DIAGNOSIS — H3581 Retinal edema: Secondary | ICD-10-CM | POA: Diagnosis not present

## 2020-03-08 DIAGNOSIS — H35033 Hypertensive retinopathy, bilateral: Secondary | ICD-10-CM | POA: Diagnosis not present

## 2020-03-08 DIAGNOSIS — H34831 Tributary (branch) retinal vein occlusion, right eye, with macular edema: Secondary | ICD-10-CM | POA: Diagnosis not present

## 2020-03-10 ENCOUNTER — Encounter: Payer: Self-pay | Admitting: Family Medicine

## 2020-03-10 ENCOUNTER — Other Ambulatory Visit: Payer: Self-pay | Admitting: Family Medicine

## 2020-03-11 ENCOUNTER — Other Ambulatory Visit: Payer: Self-pay | Admitting: Family Medicine

## 2020-03-11 MED ORDER — HYDROCHLOROTHIAZIDE 25 MG PO TABS
12.5000 mg | ORAL_TABLET | Freq: Every day | ORAL | 1 refills | Status: DC
Start: 2020-03-11 — End: 2020-04-20

## 2020-03-18 DIAGNOSIS — H34831 Tributary (branch) retinal vein occlusion, right eye, with macular edema: Secondary | ICD-10-CM | POA: Diagnosis not present

## 2020-03-18 NOTE — Telephone Encounter (Signed)
Caller Suzanne Turner  Call Back @ [phone]  Patient states she is having side effects with taking HTCZ blood pressure medication., Patient states severe muscle pains.  Please advise on futher steps patient would  Need to take.

## 2020-03-19 ENCOUNTER — Other Ambulatory Visit: Payer: Self-pay

## 2020-03-19 DIAGNOSIS — I10 Essential (primary) hypertension: Secondary | ICD-10-CM

## 2020-03-19 NOTE — Telephone Encounter (Signed)
Patient is returning a call to the cma

## 2020-03-19 NOTE — Telephone Encounter (Signed)
Lvm to call back

## 2020-03-19 NOTE — Telephone Encounter (Signed)
Pt is scheduled for BP check and lab next Wednesday

## 2020-03-22 ENCOUNTER — Other Ambulatory Visit: Payer: Self-pay

## 2020-03-22 ENCOUNTER — Encounter (HOSPITAL_COMMUNITY): Payer: Self-pay | Admitting: Emergency Medicine

## 2020-03-22 ENCOUNTER — Emergency Department (HOSPITAL_COMMUNITY): Payer: Medicare HMO

## 2020-03-22 ENCOUNTER — Ambulatory Visit (HOSPITAL_COMMUNITY): Admission: EM | Admit: 2020-03-22 | Discharge: 2020-03-22 | Payer: Medicare HMO

## 2020-03-22 ENCOUNTER — Emergency Department (HOSPITAL_COMMUNITY)
Admission: EM | Admit: 2020-03-22 | Discharge: 2020-03-23 | Disposition: A | Discharge status: Home / Self Care | Payer: Medicare HMO | Attending: Emergency Medicine | Admitting: Emergency Medicine

## 2020-03-22 DIAGNOSIS — R109 Unspecified abdominal pain: Secondary | ICD-10-CM | POA: Diagnosis not present

## 2020-03-22 DIAGNOSIS — M549 Dorsalgia, unspecified: Secondary | ICD-10-CM | POA: Diagnosis not present

## 2020-03-22 DIAGNOSIS — J439 Emphysema, unspecified: Secondary | ICD-10-CM | POA: Diagnosis not present

## 2020-03-22 DIAGNOSIS — I2510 Atherosclerotic heart disease of native coronary artery without angina pectoris: Secondary | ICD-10-CM | POA: Diagnosis not present

## 2020-03-22 DIAGNOSIS — Z79899 Other long term (current) drug therapy: Secondary | ICD-10-CM | POA: Insufficient documentation

## 2020-03-22 DIAGNOSIS — M546 Pain in thoracic spine: Secondary | ICD-10-CM | POA: Insufficient documentation

## 2020-03-22 DIAGNOSIS — R0602 Shortness of breath: Secondary | ICD-10-CM

## 2020-03-22 DIAGNOSIS — Z7982 Long term (current) use of aspirin: Secondary | ICD-10-CM | POA: Diagnosis not present

## 2020-03-22 DIAGNOSIS — R079 Chest pain, unspecified: Secondary | ICD-10-CM | POA: Diagnosis not present

## 2020-03-22 DIAGNOSIS — J984 Other disorders of lung: Secondary | ICD-10-CM | POA: Diagnosis not present

## 2020-03-22 DIAGNOSIS — I10 Essential (primary) hypertension: Secondary | ICD-10-CM | POA: Diagnosis not present

## 2020-03-22 DIAGNOSIS — F17210 Nicotine dependence, cigarettes, uncomplicated: Secondary | ICD-10-CM | POA: Diagnosis not present

## 2020-03-22 DIAGNOSIS — N281 Cyst of kidney, acquired: Secondary | ICD-10-CM | POA: Diagnosis not present

## 2020-03-22 DIAGNOSIS — R000 Tachycardia, unspecified: Secondary | ICD-10-CM | POA: Diagnosis not present

## 2020-03-22 DIAGNOSIS — Z20822 Contact with and (suspected) exposure to covid-19: Secondary | ICD-10-CM | POA: Insufficient documentation

## 2020-03-22 DIAGNOSIS — Z85828 Personal history of other malignant neoplasm of skin: Secondary | ICD-10-CM | POA: Insufficient documentation

## 2020-03-22 DIAGNOSIS — I700 Atherosclerosis of aorta: Secondary | ICD-10-CM | POA: Diagnosis not present

## 2020-03-22 LAB — APTT: aPTT: 36 seconds (ref 24–36)

## 2020-03-22 LAB — BASIC METABOLIC PANEL
Anion gap: 10 (ref 5–15)
BUN: 11 mg/dL (ref 8–23)
CO2: 26 mmol/L (ref 22–32)
Calcium: 9.3 mg/dL (ref 8.9–10.3)
Chloride: 98 mmol/L (ref 98–111)
Creatinine, Ser: 0.73 mg/dL (ref 0.44–1.00)
GFR, Estimated: >60 mL/min (ref >60)
Glucose, Bld: 141 mg/dL — ABNORMAL HIGH (ref 70–99)
Potassium: 3.7 mmol/L (ref 3.5–5.1)
Sodium: 134 mmol/L — ABNORMAL LOW (ref 135–145)

## 2020-03-22 LAB — URINALYSIS, ROUTINE W REFLEX MICROSCOPIC
Bacteria, UA: NONE SEEN
Bilirubin Urine: NEGATIVE
Glucose, UA: NEGATIVE mg/dL
Hgb urine dipstick: NEGATIVE
Ketones, ur: NEGATIVE mg/dL
Nitrite: POSITIVE — AB
Protein, ur: NEGATIVE mg/dL
Specific Gravity, Urine: 1.02 (ref 1.005–1.030)
pH: 7 (ref 5.0–8.0)

## 2020-03-22 LAB — PROTIME-INR
INR: 1 (ref 0.8–1.2)
Prothrombin Time: 13.1 seconds (ref 11.4–15.2)

## 2020-03-22 LAB — CBC
HCT: 34.2 % — ABNORMAL LOW (ref 36.0–46.0)
Hemoglobin: 12 g/dL (ref 12.0–15.0)
MCH: 31.9 pg (ref 26.0–34.0)
MCHC: 35.1 g/dL (ref 30.0–36.0)
MCV: 91 fL (ref 80.0–100.0)
Platelets: 245 10*3/uL (ref 150–400)
RBC: 3.76 MIL/uL — ABNORMAL LOW (ref 3.87–5.11)
RDW: 13.1 % (ref 11.5–15.5)
WBC: 7 10*3/uL (ref 4.0–10.5)
nRBC: 0 % (ref 0.0–0.2)

## 2020-03-22 LAB — HEPATIC FUNCTION PANEL
ALT: 20 U/L (ref 0–44)
AST: 25 U/L (ref 15–41)
Albumin: 3.7 g/dL (ref 3.5–5.0)
Alkaline Phosphatase: 73 U/L (ref 38–126)
Bilirubin, Direct: <0.1 mg/dL (ref 0.0–0.2)
Total Bilirubin: 0.6 mg/dL (ref 0.3–1.2)
Total Protein: 7.6 g/dL (ref 6.5–8.1)

## 2020-03-22 LAB — TROPONIN I (HIGH SENSITIVITY)
Troponin I (High Sensitivity): 10 ng/L (ref <18)
Troponin I (High Sensitivity): 12 ng/L (ref <18)

## 2020-03-22 LAB — BRAIN NATRIURETIC PEPTIDE: B Natriuretic Peptide: 315.5 pg/mL — ABNORMAL HIGH (ref 0.0–100.0)

## 2020-03-22 MED ORDER — LABETALOL HCL 5 MG/ML IV SOLN
10.0000 mg | Freq: Once | INTRAVENOUS | Status: AC
  Administered 2020-03-22: 10 mg via INTRAVENOUS
  Filled 2020-03-22: qty 4

## 2020-03-22 MED ORDER — FENTANYL CITRATE (PF) 100 MCG/2ML IJ SOLN
25.0000 ug | Freq: Once | INTRAMUSCULAR | Status: AC
Start: 2020-03-22 — End: 2020-03-22
  Administered 2020-03-22: 25 ug via INTRAVENOUS
  Filled 2020-03-22: qty 2

## 2020-03-22 MED ORDER — ACETAMINOPHEN 325 MG PO TABS
650.0000 mg | ORAL_TABLET | Freq: Once | ORAL | Status: AC
  Administered 2020-03-22: 650 mg via ORAL
  Filled 2020-03-22: qty 2

## 2020-03-22 MED ORDER — CYCLOBENZAPRINE HCL 10 MG PO TABS
5.0000 mg | ORAL_TABLET | Freq: Once | ORAL | Status: AC
  Administered 2020-03-22: 5 mg via ORAL
  Filled 2020-03-22: qty 1

## 2020-03-22 MED ORDER — IOHEXOL 350 MG/ML SOLN
100.0000 mL | Freq: Once | INTRAVENOUS | Status: AC | PRN
  Administered 2020-03-22: 100 mL via INTRAVENOUS

## 2020-03-22 NOTE — ED Provider Notes (Incomplete)
23:30: Assumed care of patient from Dr. Lanny Hurst & Dr. Laverta Baltimore @ change of shift pending remaining labs & disposition. Campy belly, left shoulder Dyspnea Feels better  hctz- not taking   Physical Exam  BP (!) 173/99   Pulse 87   Temp 99.2 F (37.3 C) (Oral)   Resp (!) 22   Ht 5\' 7"  (1.702 m)   Wt 58.3 kg   SpO2 98%   BMI 20.13 kg/m   Physical Exam  ED Course/Procedures     Procedures  MDM  ***

## 2020-03-22 NOTE — ED Provider Notes (Signed)
[place]    CSN: [id number] Arrival date & time: 03/22/20  1943      History   Chief Complaint Chief Complaint  Patient presents with  . Shortness of Breath  . Back Pain    HPI Suzanne Turner is a 75 y.o. female.   Suzanne Turner presents with complaints of shortness of breath, abdominal and left thoracic back pain. Originally started with right sided abdominal muscles spasms, which she says started after she had a procedure done related to her high blood pressure in which "dye was injected."  This has progressed and now with right sided abdominal spasms, as well as feeling like she can't expand her lungs. Also with left sided upper back pain. No nausea or vomiting. No headache. No cough or uri symptoms. She states she was told she had a vessel clot to her eye, which was related to her blood pressure. No other history of blood clots. History of glaucoma. Has been taking hctz for her blood pressure.          Past Medical History:  Diagnosis Date  . Abnormal thyroid blood test 01/28/2016  . Bronchitis   . Cataract   . Chronic eczema   . Glaucoma    Left eye  . Glaucoma 07/28/2014   Left eye Dr Shon Hough at [facility]  . H/O measles   . H/O mumps   . High cholesterol    no medications  . History of chicken pox   . Hypercalcemia 08/02/2014  . Hypertension   . Kidney stone   . 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   Left eye Dr Shon [facility]ough [facility]   . Osteoarthritis   . Osteopenia   . Preventative health care 01/30/2016  . Salivary duct stone   . Skin cancer   . Wears glasses     Patient Active Problem List   Diagnosis Date Noted  . Arthritis 10/20/2019  . Abdominal pain, RLQ 03/03/2018  . SCC (squamous cell carcinoma), leg, left 09/03/2017  . Hyponatremia 03/05/2017  . Left hip pain 01/29/2017  . Preventative health care 01/30/2016  . Abnormal thyroid blood test 01/28/2016  . Other iron deficiency anemias  08/02/2014  . Hypercalcemia 08/02/2014  . 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  . History of chicken pox   . Rash and nonspecific skin eruption 07/23/2013  . At risk for coronary artery disease 03/05/2013  . Colon polyps 04/17/2011  . Tremor 04/17/2011  . Hyperlipidemia 03/29/2011  . Palpitations 02/07/2011  . Smoking 02/07/2011  . CONSTIPATION 05/11/2008  . Vitamin D deficiency 05/07/2008  . Essential hypertension 05/07/2008  . Osteoporosis 05/07/2008    Past Surgical History:  Procedure Laterality Date  . ORIF [place] Right 1966  . REFRACTIVE SURGERY Left March/April 2018  . SALIVARY STONE REMOVAL Right 05/15/2012   Procedure: EXCISION OF RIGHT SUBMANDIBULAR GLAND ;  Surgeon: Izora Gala, MD;  Location: [place];  Service: ENT;  Laterality: Right;  . SALIVARY STONE REMOVAL N/A 11/18/2012   Procedure: INTRAORAL EXCISION SUBMANDIBULAR STONE RIGHT ;  Surgeon: Izora Gala, MD;  Location: [place];  Service: ENT;  Laterality: N/A;  . SKIN BIOPSY Right 12/2012   legs  . TONSILLECTOMY  1949  . TUBAL LIGATION  1977    OB History    Gravida  2   Para  2   Term      Preterm      AB      Living  SAB      IAB      Ectopic      Multiple      Live Births               Home Medications    Prior to Admission medications   Medication Sig Start Date End Date Taking? Authorizing Provider  acetaminophen (TYLENOL) 325 MG tablet Take 325 mg by mouth daily as needed for pain.    [provider]  aspirin EC 81 MG tablet Take 81 mg by mouth daily.      [provider]  Calcium Carb-Cholecalciferol (CALCIUM-VITAMIN D) 500-200 MG-UNIT tablet Take 1 tablet by mouth daily.    [provider]  hydrochlorothiazide (HYDRODIURIL) 25 MG tablet Take 0.5 tablets (12.5 mg total) by mouth daily. Take 1/2 tablet 3 times per week 03/11/20   Mosie Lukes, MD  Latanoprostene Bunod (VYZULTA) 0.024 % SOLN Apply to eye.    [provider]  Multiple Vitamins-Minerals (HAIR/SKIN/NAILS/BIOTIN PO) Take 500 mg by mouth 3 (three) times daily.    [provider]    Family History Family History  Problem Relation Age of Onset  . Arrhythmia Mother   . Colon cancer Mother 71  . Cancer Mother   . Tremor Mother   . Arrhythmia Maternal Grandmother   . Glaucoma Maternal Grandmother   . Breast cancer Sister   . Cancer Sister        breast  . Glaucoma Sister   . Heart disease Father 18  . Arthritis Father        broken back with chronic pain  . Glaucoma Father   . Heart disease Brother   . Arrhythmia Brother   . Tremor Brother   . Hypertension Daughter   . Arrhythmia Maternal Uncle   . Heart disease Maternal Uncle   . Colon polyps Maternal Aunt     Social History Social History   Tobacco Use  . Smoking status: Current Every Day Smoker    Packs/day: 0.50    Years: 40.00    Pack years: 20.00    Types: Cigarettes  . Smokeless tobacco: Never Used  Substance Use Topics  . Alcohol use: No  . Drug use: No     Allergies   Medrol [methylprednisolone], Lisinopril, Losartan, Penicillins, Sulfonamide derivatives, and Timolol   Review of Systems Review of Systems   Physical Exam Triage Vital Signs ED Triage Vitals [03/22/20 1946]  Enc Vitals Group     BP (!) 189/87     Pulse Rate (!) 107     Resp (!) 22     Temp      Temp Source Oral     SpO2 97 %     Weight      Height      Head Circumference      Peak Flow      Pain Score      Pain Loc      Pain Edu?      Excl. in [place]?    No data found.  Updated Vital Signs BP (!) 189/87 (BP Location: Right Arm)   Pulse (!) 107   Resp (!) 22   SpO2 97%   Visual Acuity Right Eye Distance:   Left Eye Distance:   Bilateral Distance:    Right Eye Near:   Left Eye Near:    Bilateral Near:     Physical Exam Constitutional:      General: She is not  in acute distress.    Appearance: She is well-developed.  Cardiovascular:     Rate and  Rhythm: Tachycardia present.  Pulmonary:     Effort: Tachypnea and accessory muscle usage present.     Breath sounds: No decreased breath sounds.     Comments: Patient leaning forward and breathing rapidly, states she is unable to take a deep breath, feels "restricted"  Chest:     Chest wall: No tenderness.  Abdominal:       Comments: Indicates she has right sided abdominal "spasms" and feels "bloated"; not obviously tender on palpation  Musculoskeletal:     Comments: Pain to left thorax, which is not reproducible on palpation  Skin:    General: Skin is warm and dry.  Neurological:     Mental Status: She is alert and oriented to person, place, and time.    EKG:  Sinus tach. Previous EKG was available for review. No stwave changes as interpreted by me.    UC Treatments / Results  Labs (all labs ordered are listed, but only abnormal results are displayed) Labs Reviewed - No data to display  EKG   Radiology No results found.  Procedures Procedures (including critical care time)  Medications Ordered in UC Medications - No data to display  Initial Impression / Assessment and Plan / UC Course  I have reviewed the triage vital signs and the nursing notes.  Pertinent labs & imaging results that were available during my care of the patient were reviewed by me and considered in my medical decision making (see chart for details).     Patient is very short of breath and in very apparent pain. AAA vs pa vs acs vs nephrolithiasis considered. Recommend ER now, recommended EMS transport and patient adamantly declines, stating "its been going on for 2 weeks, my sister can take me since it's right over there."  She verbalizes risks of self transport, however, going to ER now via private vehicle with her sister.     Final Clinical Impressions(s) / UC Diagnoses   Final diagnoses:  Shortness of breath  Upper back pain on left side  Abdominal pain, unspecified abdominal location      Discharge Instructions     I do recommend that you go to the ER now for further evaluation. Due to your pain and shortness of breath I do recommend that you go by ambulance.    ED Prescriptions    None     PDMP not reviewed this encounter.   Zigmund Gottron, NP 03/22/20 2004

## 2020-03-22 NOTE — Discharge Instructions (Signed)
I do recommend that you go to the ER now for further evaluation. Due to your pain and shortness of breath I do recommend that you go by ambulance.

## 2020-03-22 NOTE — ED Provider Notes (Signed)
[facility] EMERGENCY DEPARTMENT Provider Note   CSN: [id number] Arrival date & time: 03/22/20  2015     History Chief Complaint  Patient presents with  . Chest Pain    Sob    . Shortness of Breath    Suzanne Turner is a 75 y.o. female w/ h/o HLD, HTN, kidney stones, and tobacco use who presents to the ED for L upper back pain, R-sided abdominal pain, and SOB. Symptoms came on suddenly when she bent over to feed her cats at approximately 1800 this evening. Pain described as sharp, cramping-like pain. She states she feels as if she cannot take a deep breath. Denies headache, vision changes, neck pain, cough, hemoptysis, chest pain, numbness, or weakness. No previous cardiac history. No anticoagulation. No unilateral leg pain or swelling. Seen at [facility] earlier this evening for same complaint and sent immediately to ED for further evaluation. Recently stopped taking her BP meds per PCP due to abdominal and L shoulder cramping; PCP voiced concern for possible electrolyte derangement and scheduled to follow up in her clinic in 2 days.  The history is provided by the patient and medical records.  Shortness of Breath Severity:  Severe Onset quality:  Sudden Duration:  4 hours Timing:  Constant Progression:  Unchanged Chronicity:  New Context comment:  After bending over Relieved by:  Nothing Worsened by:  Nothing Ineffective treatments:  None tried Associated symptoms: abdominal pain   Associated symptoms: no chest pain, no cough, no diaphoresis, no ear pain, no fever, no headaches, no hemoptysis, no neck pain, no rash, no sore throat, no sputum production, no syncope, no vomiting and no wheezing   Risk factors: tobacco use   Risk factors: no hx of PE/DVT and no obesity        Past Medical History:  Diagnosis Date  . Abnormal thyroid blood test 01/28/2016  . Bronchitis   . Cataract   . Chronic eczema   . Glaucoma    Left eye  . Glaucoma 07/28/2014   Left eye Dr  Shon Hough at [facility]  . H/O measles   . H/O mumps   . High cholesterol    no medications  . History of chicken pox   . Hypercalcemia 08/02/2014  . Hypertension   . Kidney stone   . 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   Left eye Dr Shon Hough at [facility]   . Osteoarthritis   . Osteopenia   . Preventative health care 01/30/2016  . Salivary duct stone   . Skin cancer   . Wears glasses     Patient Active Problem List   Diagnosis Date Noted  . Arthritis 10/20/2019  . Abdominal pain, RLQ 03/03/2018  . SCC (squamous cell carcinoma), leg, left 09/03/2017  . Hyponatremia 03/05/2017  . Left hip pain 01/29/2017  . Preventative health care 01/30/2016  . Abnormal thyroid blood test 01/28/2016  . Other iron deficiency anemias 08/02/2014  . Hypercalcemia 08/02/2014  . 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  . History of chicken pox   . Rash and nonspecific skin eruption 07/23/2013  . At risk for coronary artery disease 03/05/2013  . Colon polyps 04/17/2011  . Tremor 04/17/2011  . Hyperlipidemia 03/29/2011  . Palpitations 02/07/2011  . Smoking 02/07/2011  . CONSTIPATION 05/11/2008  . Vitamin D deficiency 05/07/2008  . Essential hypertension 05/07/2008  . Osteoporosis 05/07/2008    Past Surgical History:  Procedure Laterality Date  . ORIF [place] Right 1966  . REFRACTIVE  SURGERY Left March/April 2018  . SALIVARY STONE REMOVAL Right 05/15/2012   Procedure: EXCISION OF RIGHT SUBMANDIBULAR GLAND ;  Surgeon: Izora Gala, MD;  Location: [facility];  Service: ENT;  Laterality: Right;  . SALIVARY STONE REMOVAL N/A 11/18/2012   Procedure: INTRAORAL EXCISION SUBMANDIBULAR STONE RIGHT ;  Surgeon: Izora Gala, MD;  Location: [facility];  Service: ENT;  Laterality: N/A;  . SKIN BIOPSY Right 12/2012   legs  . TONSILLECTOMY  1949  . TUBAL LIGATION  1977     OB History    Gravida  2   Para  2   Term      Preterm      AB       Living        SAB      IAB      Ectopic      Multiple      Live Births              Family History  Problem Relation Age of Onset  . Arrhythmia Mother   . Colon cancer Mother 61  . Cancer Mother   . Tremor Mother   . Arrhythmia Maternal Grandmother   . Glaucoma Maternal Grandmother   . Breast cancer Sister   . Cancer Sister        breast  . Glaucoma Sister   . Heart disease Father 28  . Arthritis Father        broken back with chronic pain  . Glaucoma Father   . Heart disease Brother   . Arrhythmia Brother   . Tremor Brother   . Hypertension Daughter   . Arrhythmia Maternal Uncle   . Heart disease Maternal Uncle   . Colon polyps Maternal Aunt     Social History   Tobacco Use  . Smoking status: Current Every Day Smoker    Packs/day: 0.50    Years: 40.00    Pack years: 20.00    Types: Cigarettes  . Smokeless tobacco: Never Used  Substance Use Topics  . Alcohol use: No  . Drug use: No    Home Medications Prior to Admission medications   Medication Sig Start Date End Date Taking? Authorizing Provider  acetaminophen (TYLENOL) 325 MG tablet Take 325 mg by mouth daily as needed for pain.   Yes [provider]  aspirin EC 81 MG tablet Take 81 mg by mouth daily.   Yes [provider]  Multiple Vitamins-Minerals (HAIR/SKIN/NAILS/BIOTIN PO) Take 500 mg by mouth 3 (three) times daily.   Yes [provider]  triamcinolone (KENALOG) 0.1 % Apply 1 application topically daily as needed (affected areas).   Yes [provider]  VYZULTA 0.024 % SOLN Place 1 drop into both eyes at bedtime.   Yes [provider]  hydrochlorothiazide (HYDRODIURIL) 25 MG tablet Take 0.5 tablets (12.5 mg total) by mouth daily. Take 1/2 tablet 3 times per week Patient taking differently: Take 12.5 mg by mouth every Monday, Wednesday, and Friday. 03/11/20   Mosie Lukes, MD  potassium chloride SA (KLOR-CON) 20 MEQ tablet Take 20 mEq by mouth  every Monday, Wednesday, and Friday.    [provider]    Allergies    Medrol [methylprednisolone], Lisinopril, Losartan, Penicillins, Sulfonamide derivatives, and Timolol  Review of Systems   Review of Systems  Constitutional: Negative for chills, diaphoresis and fever.  HENT: Negative for ear pain and sore throat.   Eyes: Negative for pain and visual disturbance.  Respiratory: Positive for shortness of breath. Negative for cough, hemoptysis, sputum production and wheezing.   Cardiovascular: Negative for chest pain, palpitations and syncope.  Gastrointestinal: Positive for abdominal pain. Negative for diarrhea, nausea and vomiting.  Genitourinary: Negative for dysuria and hematuria.  Musculoskeletal: Positive for back pain. Negative for arthralgias and neck pain.  Skin: Negative for color change and rash.  Neurological: Negative for seizures, syncope and headaches.  All other systems reviewed and are negative.   Physical Exam Updated Vital Signs BP (!) 173/99   Pulse 87   Temp 99.2 F (37.3 C) (Oral)   Resp (!) 22   Ht 5\' 7"  (1.702 m)   Wt 58.3 kg   SpO2 98%   BMI 20.13 kg/m   Physical Exam Vitals and nursing note reviewed.  Constitutional:      General: She is awake. She is in acute distress.     Appearance: She is well-developed, well-groomed and normal weight. She is ill-appearing.  HENT:     Head: Normocephalic and atraumatic.     Right Ear: External ear normal.     Left Ear: External ear normal.     Nose: Nose normal.  Eyes:     General: No visual field deficit or scleral icterus.       Right eye: No discharge.        Left eye: No discharge.     Conjunctiva/sclera: Conjunctivae normal.  Neck:     Vascular: No JVD.  Cardiovascular:     Rate and Rhythm: Regular rhythm. Tachycardia present.     Pulses: Normal pulses.          Radial pulses are 2+ on the right side and 2+ on the left side.       Dorsalis pedis pulses are 2+ on the right side and 2+  on the left side.     Heart sounds: Normal heart sounds. No murmur heard.   Pulmonary:     Effort: Pulmonary effort is normal. No respiratory distress.     Breath sounds: Normal breath sounds. No wheezing, rhonchi or rales.  Abdominal:     General: Abdomen is flat. There is no distension.     Palpations: Abdomen is soft. There is no pulsatile mass.     Tenderness: There is abdominal tenderness. There is no right CVA tenderness, left CVA tenderness, guarding or rebound. Negative signs include Murphy's sign, Rovsing's sign and McBurney's sign.     Comments: Tenderness over R flank without obvious underlying mass or overlying skin changes.  Musculoskeletal:     Right lower leg: No edema.     Left lower leg: No edema.  Skin:    General: Skin is warm and dry.     Findings: No rash.  Neurological:     General: No focal deficit present.     Mental Status: She is alert and oriented to person, place, and time.     GCS: GCS eye subscore is 4. GCS verbal subscore is 5. GCS motor subscore is 6.     Cranial Nerves: No dysarthria or facial asymmetry.     Sensory: Sensation is intact. No sensory deficit.     Motor: Motor function is intact. No weakness.  Psychiatric:        Behavior: Behavior is cooperative.     ED Results / Procedures / Treatments   Labs (all labs ordered are listed, but only abnormal results are displayed) Labs Reviewed  BASIC METABOLIC PANEL - Abnormal; Notable for the  following components:      Result Value   Sodium 134 (*)    Glucose, Bld 141 (*)    All other components within normal limits  CBC - Abnormal; Notable for the following components:   RBC 3.76 (*)    HCT 34.2 (*)    All other components within normal limits  BRAIN NATRIURETIC PEPTIDE - Abnormal; Notable for the following components:   B Natriuretic Peptide 315.5 (*)    All other components within normal limits  RESP PANEL BY RT-PCR (FLU A&B, COVID) ARPGX2  HEPATIC FUNCTION PANEL  PROTIME-INR  APTT   URINALYSIS, ROUTINE W REFLEX MICROSCOPIC  TROPONIN I (HIGH SENSITIVITY)  TROPONIN I (HIGH SENSITIVITY)    EKG EKG Interpretation  Date/Time:  Monday March 22 2020 20:29:10 EST Ventricular Rate:  107 PR Interval:  160 QRS Duration: 74 QT Interval:  326 QTC Calculation: 435 R Axis:   67 Text Interpretation: Sinus tachycardia Minimal voltage criteria for LVH, may be normal variant ( Sokolow-Lyon ) Nonspecific ST abnormality Abnormal ECG No significant change since last tracing Confirmed by Nanda Quinton [phone]) on 03/22/2020 9:23:36 PM   Radiology DG Chest 2 View  Result Date: 03/22/2020 CLINICAL DATA:  Chest pain.  Shortness of breath. EXAM: CHEST - 2 VIEW COMPARISON:  05/03/2012 FINDINGS: Heart size is normal. There is mild prominence of interstitial markings. No focal consolidations or pleural effusions. No pulmonary edema. IMPRESSION: Mild prominence of interstitial markings. Electronically Signed   By: Nolon Nations M.D.   On: 03/22/2020 21:10   CT Angio Chest/Abd/Pel for Dissection W and/or W/WO  Result Date: 03/22/2020 CLINICAL DATA:  Short of breath, hypertension, right back pain EXAM: CT ANGIOGRAPHY CHEST, ABDOMEN AND PELVIS TECHNIQUE: Non-contrast CT of the chest was initially obtained. Multidetector CT imaging through the chest, abdomen and pelvis was performed using the standard protocol during bolus administration of intravenous contrast. Multiplanar reconstructed images and MIPs were obtained and reviewed to evaluate the vascular anatomy. CONTRAST:  164mL OMNIPAQUE IOHEXOL 350 MG/ML SOLN COMPARISON:  03/22/2020, 04/11/2013 FINDINGS: CTA CHEST FINDINGS Cardiovascular: No evidence of thoracic aortic aneurysm or dissection. Moderate atherosclerosis of the descending thoracic aorta. The heart is unremarkable without pericardial effusion. No evidence of pulmonary embolus. Mediastinum/Nodes: Subcarinal adenopathy measures up to 16 mm in short axis. No other pathologic lymph nodes  are seen. Trachea, thyroid, and esophagus are unremarkable. Lungs/Pleura: Upper lobe predominant emphysema. Biapical pleural and parenchymal scarring. No acute airspace disease, effusion, or pneumothorax. Central airways are patent. Musculoskeletal: No acute or destructive bony lesions. Reconstructed images demonstrate no additional findings. Review of the MIP images confirms the above findings. CTA ABDOMEN AND PELVIS FINDINGS VASCULAR Aorta: Normal caliber aorta without aneurysm, dissection, vasculitis or significant stenosis. Celiac: Patent without evidence of aneurysm, dissection, vasculitis or significant stenosis. SMA: Patent without evidence of aneurysm, dissection, vasculitis or significant stenosis. 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 Renals: Both renal arteries are patent without evidence of aneurysm, dissection, vasculitis, fibromuscular dysplasia or significant stenosis. IMA: Patent without evidence of aneurysm, dissection, vasculitis or significant stenosis. Inflow: Patent without evidence of aneurysm, dissection, vasculitis or significant stenosis. Veins: No obvious venous abnormality within the limitations of this arterial phase study. Review of the MIP images confirms the above findings. NON-VASCULAR Hepatobiliary: No focal liver abnormality is seen. No gallstones, gallbladder wall thickening, or biliary dilatation. Pancreas: Unremarkable. No pancreatic ductal dilatation or surrounding inflammatory changes. Spleen: Normal in size without focal abnormality. Adrenals/Urinary Tract: Multiple bilateral renal cysts. No urinary  tract calculi or obstructive uropathy. Bladder is unremarkable. The adrenals are normal. Stomach/Bowel: No bowel obstruction or ileus. No bowel wall thickening or inflammatory change. Lymphatic: No pathologic adenopathy within the abdomen or pelvis. Reproductive: Uterus and bilateral adnexa are unremarkable. Other: No free fluid or free gas.  No abdominal  wall hernia. Musculoskeletal: No acute or destructive bony lesions. Reconstructed images demonstrate no additional findings. Review of the MIP images confirms the above findings. IMPRESSION: 1. No evidence of thoracoabdominal aortic aneurysm or dissection. 2. No evidence of pulmonary embolus. 3. Nonspecific subcarinal adenopathy. 4. No acute intra-abdominal or intrapelvic process. 5. Aortic Atherosclerosis (ICD10-I70.0) and Emphysema (ICD10-J43.9). Electronically Signed   By: Randa Ngo M.D.   On: 03/22/2020 22:36    Procedures Procedures  Medications Ordered in ED Medications  acetaminophen (TYLENOL) tablet 650 mg (has no administration in time range)  cyclobenzaprine (FLEXERIL) tablet 5 mg (has no administration in time range)  fentaNYL (SUBLIMAZE) injection 25 mcg (25 mcg Intravenous Given 03/22/20 2157)  labetalol (NORMODYNE) injection 10 mg (10 mg Intravenous Given 03/22/20 2154)  iohexol (OMNIPAQUE) 350 MG/ML injection 100 mL (100 mLs Intravenous Contrast Given 03/22/20 2226)    ED Course  I have reviewed the triage vital signs and the nursing notes.  Pertinent labs & imaging results that were available during my care of the patient were reviewed by me and considered in my medical decision making (see chart for details).    MDM Rules/Calculators/A&P                          Patient is a 39yoF with history and physical as described above who presents to the ED for L upper back pain, R flank pain, and SOB. VS notable for hypertension with SBP 200s, tachycardia to 100s, satting well on RA, but visibly SOB and anxious on exam. Initial concern for aortic dissection. CTA dissection ordered and patient given empiric Labetalol for HR and BP control and fentanyl for pain. Additional blood work collected. Initial ECG significant for sinus tachycardia without acute ischemic or dysrhythmic changes.  CTA dissection study unremarkable. CBC, BMP, and initial troponin reassuring. BNP slightly  elevated. Delta troponin and UA pending. On reassessment, patient appears more comfortable and no longer SOB. Doubt PE, aortic dissection, ACS, pericarditis, PNA, PTX, cholecystitis, nephrolithiasis, pyelonephritis, mesenteric ischemic, fracture, dislocation, or other acute pathology at this time. Suspect presentation likely musculoskeletal with probable component of underlying anxiety. Suspect hypertension likely secondary to anxiety and being off antihypertensives. Recommend she take BP meds until seen by PCP in 2 days given no electrolyte concerns at this time. At time of sign out, delta troponin and UA pending. Discussed diagnostic workup findings with patient who was relieved with results. She states she feels comfortable returning home and following up with PCP. Sister also with patient at bedside who will give her a ride home and be with her overnight. Will give Tylenol and Flexeril in ED for symptoms while waiting for remainder of labs.  Transfer of care at 2300 to [facility]. Please refer to her note for further MDM and ultimate disposition. Patient transferred in stable condition.  Final Clinical Impression(s) / ED Diagnoses Final diagnoses:  Shortness of breath  Acute left-sided thoracic back pain      Christy Gentles, MD 03/22/20 2336    Margette Fast, MD 04/01/20 [phone]

## 2020-03-22 NOTE — ED Notes (Signed)
Patient is being discharged from the Urgent Care and sent to the Emergency Department via personal vehicle . Per provider Augusto Gamble, patient is in need of higher level of care due to cardiac event. Patient is aware and verbalizes understanding of plan of care.  Vitals:   03/22/20 1946  BP: (!) 189/87  Pulse: (!) 107  Resp: (!) 22  SpO2: 97%    PT DECLINED EMS

## 2020-03-22 NOTE — ED Provider Notes (Signed)
23:30: Assumed care of patient from Dr. Lanny Hurst & Dr. Laverta Baltimore @ change of shift pending remaining labs & disposition.  Urinalysis did return during signout process, recommended treatment with antibiotics.  Plan at change of shift is to follow-up on delta troponin & PT/INR/APTT, if no significant elevation in troponin, can likely discharge home.  Patient has close PCP follow-up.  Overall reassuring ED work-up.  Troponin without significant elevation.  aPTT/PT/INR within normal limits.  Urinalysis with nitrite positive moderate leukocytes, Dr. Laverta Baltimore has sent a culture.  We will treat for UTI with fosfomycin in the ED- no old cultures to assist with abx selection. We will also provide Lidoderm patches to help with discomfort.  Patient has close PCP follow-up scheduled for 03/24/2020. I discussed results, treatment plan, need for follow-up, and return precautions with the patient & her sister @ bedside. Provided opportunity for questions, patient & her sister confirmed understanding and are in agreement with plan.   Results for orders placed or performed during the hospital encounter of 03/22/20  Resp Panel by RT-PCR (Flu A&B, Covid) Nasopharyngeal Swab   Specimen: Nasopharyngeal Swab; Nasopharyngeal(NP) swabs in vial transport medium  Result Value Ref Range   SARS Coronavirus 2 by RT PCR NEGATIVE NEGATIVE   Influenza A by PCR NEGATIVE NEGATIVE   Influenza B by PCR NEGATIVE NEGATIVE  Basic metabolic panel  Result Value Ref Range   Sodium 134 (L) 135 - 145 mmol/L   Potassium 3.7 3.5 - 5.1 mmol/L   Chloride 98 98 - 111 mmol/L   CO2 26 22 - 32 mmol/L   Glucose, Bld 141 (H) 70 - 99 mg/dL   BUN 11 8 - 23 mg/dL   Creatinine, Ser 0.73 0.44 - 1.00 mg/dL   Calcium 9.3 8.9 - 10.3 mg/dL   GFR, Estimated >60 >60 mL/min   Anion gap 10 5 - 15  CBC  Result Value Ref Range   WBC 7.0 4.0 - 10.5 K/uL   RBC 3.76 (L) 3.87 - 5.11 MIL/uL   Hemoglobin 12.0 12.0 - 15.0 g/dL   HCT 34.2 (L) 36.0 - 46.0 %   MCV 91.0 80.0  - 100.0 fL   MCH 31.9 26.0 - 34.0 pg   MCHC 35.1 30.0 - 36.0 g/dL   RDW 13.1 11.5 - 15.5 %   Platelets 245 150 - 400 K/uL   nRBC 0.0 0.0 - 0.2 %  Hepatic function panel  Result Value Ref Range   Total Protein 7.6 6.5 - 8.1 g/dL   Albumin 3.7 3.5 - 5.0 g/dL   AST 25 15 - 41 U/L   ALT 20 0 - 44 U/L   Alkaline Phosphatase 73 38 - 126 U/L   Total Bilirubin 0.6 0.3 - 1.2 mg/dL   Bilirubin, Direct <0.1 0.0 - 0.2 mg/dL   Indirect Bilirubin NOT CALCULATED 0.3 - 0.9 mg/dL  Brain natriuretic peptide  Result Value Ref Range   B Natriuretic Peptide 315.5 (H) 0.0 - 100.0 pg/mL  Urinalysis, Routine w reflex microscopic  Result Value Ref Range   Color, Urine YELLOW YELLOW   APPearance HAZY (A) CLEAR   Specific Gravity, Urine 1.020 1.005 - 1.030   pH 7.0 5.0 - 8.0   Glucose, UA NEGATIVE NEGATIVE mg/dL   Hgb urine dipstick NEGATIVE NEGATIVE   Bilirubin Urine NEGATIVE NEGATIVE   Ketones, ur NEGATIVE NEGATIVE mg/dL   Protein, ur NEGATIVE NEGATIVE mg/dL   Nitrite POSITIVE (A) NEGATIVE   Leukocytes,Ua MODERATE (A) NEGATIVE   RBC / HPF 0-5  0 - 5 RBC/hpf   WBC, UA 11-20 0 - 5 WBC/hpf   Bacteria, UA NONE SEEN NONE SEEN   Squamous Epithelial / LPF 0-5 0 - 5   Mucus PRESENT   Protime-INR  Result Value Ref Range   Prothrombin Time 13.1 11.4 - 15.2 seconds   INR 1.0 0.8 - 1.2  APTT  Result Value Ref Range   aPTT 36 24 - 36 seconds  Troponin I (High Sensitivity)  Result Value Ref Range   Troponin I (High Sensitivity) 10 <18 ng/L  Troponin I (High Sensitivity)  Result Value Ref Range   Troponin I (High Sensitivity) 12 <18 ng/L   DG Chest 2 View  Result Date: 03/22/2020 CLINICAL DATA:  Chest pain.  Shortness of breath. EXAM: CHEST - 2 VIEW COMPARISON:  05/03/2012 FINDINGS: Heart size is normal. There is mild prominence of interstitial markings. No focal consolidations or pleural effusions. No pulmonary edema. IMPRESSION: Mild prominence of interstitial markings. Electronically Signed   By:  Nolon Nations M.D.   On: 03/22/2020 21:10   CT Angio Chest/Abd/Pel for Dissection W and/or W/WO  Result Date: 03/22/2020 CLINICAL DATA:  Short of breath, hypertension, right back pain EXAM: CT ANGIOGRAPHY CHEST, ABDOMEN AND PELVIS TECHNIQUE: Non-contrast CT of the chest was initially obtained. Multidetector CT imaging through the chest, abdomen and pelvis was performed using the standard protocol during bolus administration of intravenous contrast. Multiplanar reconstructed images and MIPs were obtained and reviewed to evaluate the vascular anatomy. CONTRAST:  189mL OMNIPAQUE IOHEXOL 350 MG/ML SOLN COMPARISON:  03/22/2020, 04/11/2013 FINDINGS: CTA CHEST FINDINGS Cardiovascular: No evidence of thoracic aortic aneurysm or dissection. Moderate atherosclerosis of the descending thoracic aorta. The heart is unremarkable without pericardial effusion. No evidence of pulmonary embolus. Mediastinum/Nodes: Subcarinal adenopathy measures up to 16 mm in short axis. No other pathologic lymph nodes are seen. Trachea, thyroid, and esophagus are unremarkable. Lungs/Pleura: Upper lobe predominant emphysema. Biapical pleural and parenchymal scarring. No acute airspace disease, effusion, or pneumothorax. Central airways are patent. Musculoskeletal: No acute or destructive bony lesions. Reconstructed images demonstrate no additional findings. Review of the MIP images confirms the above findings. CTA ABDOMEN AND PELVIS FINDINGS VASCULAR Aorta: Normal caliber aorta without aneurysm, dissection, vasculitis or significant stenosis. Celiac: Patent without evidence of aneurysm, dissection, vasculitis or significant stenosis. SMA: Patent without evidence of aneurysm, dissection, vasculitis or significant stenosis. 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 Renals: Both renal arteries are patent without evidence of aneurysm, dissection, vasculitis, fibromuscular dysplasia or significant stenosis. IMA: Patent without  evidence of aneurysm, dissection, vasculitis or significant stenosis. Inflow: Patent without evidence of aneurysm, dissection, vasculitis or significant stenosis. Veins: No obvious venous abnormality within the limitations of this arterial phase study. Review of the MIP images confirms the above findings. NON-VASCULAR Hepatobiliary: No focal liver abnormality is seen. No gallstones, gallbladder wall thickening, or biliary dilatation. Pancreas: Unremarkable. No pancreatic ductal dilatation or surrounding inflammatory changes. Spleen: Normal in size without focal abnormality. Adrenals/Urinary Tract: Multiple bilateral renal cysts. No urinary tract calculi or obstructive uropathy. Bladder is unremarkable. The adrenals are normal. Stomach/Bowel: No bowel obstruction or ileus. No bowel wall thickening or inflammatory change. Lymphatic: No pathologic adenopathy within the abdomen or pelvis. Reproductive: Uterus and bilateral adnexa are unremarkable. Other: No free fluid or free gas.  No abdominal wall hernia. Musculoskeletal: No acute or destructive bony lesions. Reconstructed images demonstrate no additional findings. Review of the MIP images confirms the above findings. IMPRESSION: 1. No evidence of thoracoabdominal aortic  aneurysm or dissection. 2. No evidence of pulmonary embolus. 3. Nonspecific subcarinal adenopathy. 4. No acute intra-abdominal or intrapelvic process. 5. Aortic Atherosclerosis (ICD10-I70.0) and Emphysema (ICD10-J43.9). Electronically Signed   By: Randa Ngo M.D.   On: 03/22/2020 22:36       Amaryllis Dyke, PA-C 03/23/20 0038    Margette Fast, MD 04/01/20 [phone]

## 2020-03-22 NOTE — ED Triage Notes (Signed)
Pt reports chest pain that moves to her shoulder blades.  She has been very SOB.  Reports some changes in medications.

## 2020-03-23 DIAGNOSIS — R0602 Shortness of breath: Secondary | ICD-10-CM | POA: Diagnosis not present

## 2020-03-23 LAB — RESP PANEL BY RT-PCR (FLU A&B, COVID) ARPGX2
Influenza A by PCR: NEGATIVE
Influenza B by PCR: NEGATIVE
SARS Coronavirus 2 by RT PCR: NEGATIVE

## 2020-03-23 MED ORDER — FOSFOMYCIN TROMETHAMINE 3 G PO PACK
3.0000 g | PACK | Freq: Once | ORAL | Status: AC
  Administered 2020-03-23: 3 g via ORAL
  Filled 2020-03-23: qty 3

## 2020-03-23 MED ORDER — LIDOCAINE 5 % EX PTCH: 1.0000 | MEDICATED_PATCH | Freq: Every day | CUTANEOUS | 0 refills | Status: DC | PRN

## 2020-03-23 MED ORDER — LIDOCAINE 5 % EX PTCH
1.0000 | MEDICATED_PATCH | CUTANEOUS | Status: DC
  Administered 2020-03-23: 1 via TRANSDERMAL
  Filled 2020-03-23: qty 1

## 2020-03-23 NOTE — Discharge Instructions (Addendum)
You were seen in the emergency department today for discomfort as well as trouble breathing.  Your work-up in the ER was overall reassuring.  Your urine did show findings of infection.  We gave you a dose of fosfomycin, an antibiotic, in the emergency department to treat this.  We have sent a culture of your urine, we will call you if you need additional antibiotics.  We are sending you home with Lidoderm patches to help with pain.  Please apply patch to area most significant pain once per day as needed.  Remove and discard within 12 hours.  We have prescribed you new medication(s) today. Discuss the medications prescribed today with your pharmacist as they can have adverse effects and interactions with your other medicines including over the counter and prescribed medications. Seek medical evaluation if you start to experience new or abnormal symptoms after taking one of these medicines, seek care immediately if you start to experience difficulty breathing, feeling of your throat closing, facial swelling, or rash as these could be indications of a more serious allergic reaction  Please follow-up with your primary care provider as scheduled.  Return to the ER for new or worsening symptoms including but not limited to worsening pain, new pain, fever, trouble breathing, passing out, inability to keep fluids down, or any other concerns.

## 2020-03-24 ENCOUNTER — Ambulatory Visit: Payer: Medicare HMO

## 2020-03-24 ENCOUNTER — Telehealth: Payer: Self-pay

## 2020-03-24 ENCOUNTER — Other Ambulatory Visit: Payer: Self-pay

## 2020-03-24 ENCOUNTER — Other Ambulatory Visit (INDEPENDENT_AMBULATORY_CARE_PROVIDER_SITE_OTHER): Payer: Medicare HMO

## 2020-03-24 ENCOUNTER — Other Ambulatory Visit: Payer: Self-pay | Admitting: Family Medicine

## 2020-03-24 DIAGNOSIS — I10 Essential (primary) hypertension: Secondary | ICD-10-CM

## 2020-03-24 LAB — COMPREHENSIVE METABOLIC PANEL
ALT: 17 U/L (ref 0–35)
AST: 20 U/L (ref 0–37)
Albumin: 3.9 g/dL (ref 3.5–5.2)
Alkaline Phosphatase: 77 U/L (ref 39–117)
BUN: 11 mg/dL (ref 6–23)
CO2: 30 mEq/L (ref 19–32)
Calcium: 9.2 mg/dL (ref 8.4–10.5)
Chloride: 99 mEq/L (ref 96–112)
Creatinine, Ser: 0.76 mg/dL (ref 0.40–1.20)
GFR: 76.94 mL/min (ref >60.00)
Glucose, Bld: 80 mg/dL (ref 70–99)
Potassium: 3.8 mEq/L (ref 3.5–5.1)
Sodium: 135 mEq/L (ref 135–145)
Total Bilirubin: 0.5 mg/dL (ref 0.2–1.2)
Total Protein: 7 g/dL (ref 6.0–8.3)

## 2020-03-24 LAB — URINE CULTURE

## 2020-03-24 LAB — MAGNESIUM: Magnesium: 2.4 mg/dL (ref 1.5–2.5)

## 2020-03-24 MED ORDER — AMLODIPINE BESYLATE 2.5 MG PO TABS: 2.5000 mg | ORAL_TABLET | Freq: Every day | ORAL | 0 refills | Status: DC

## 2020-03-24 NOTE — Telephone Encounter (Signed)
Pt had a nurse visit today.  Her BP was 172/99 at the ER on 03/22/20   BP today @ = 154/ 80 HR = 81  Pt advised per Dr Larose Kells (DOD) since you were off not to make any additional medication changes Pt was advised that we would call once we received on medication one you returned. Pt voiced understanding.

## 2020-03-24 NOTE — Telephone Encounter (Signed)
Start her on Amlodipine 2.5 mg tabs, 1 tab daily and recheck pulse and bp in 2 weeks, disp #30 with 1 rf, please send in once patient knows.

## 2020-03-24 NOTE — Telephone Encounter (Signed)
Pt is aware, Rx sent, BP check scheduled.

## 2020-03-24 NOTE — Progress Notes (Addendum)
Pt here for Blood pressure check per Dr Charlett Blake  Pt was advised per MyChart to stop HCTZ 25 mg last week.    Pt reports compliance with medication instructions.  BP was 172/99 at the ER on 03/22/20   BP today @ = 154/ 80 HR = 81  Pt advised per Dr Larose Kells (DOD) since Dr Charlett Blake is off today not to make any medication changes. I will send a task to Dr Charlett Blake for her input and she will advise on her return since she is off work today. Pt voices understanding.   Nursing note reviewed. Agree with documention and plan.

## 2020-03-24 NOTE — Addendum Note (Signed)
Addended by: Kelle Darting A on: 03/24/2020 09:02 AM   Modules accepted: Orders

## 2020-03-30 ENCOUNTER — Other Ambulatory Visit: Payer: Self-pay | Admitting: Family Medicine

## 2020-03-30 DIAGNOSIS — Z1231 Encounter for screening mammogram for malignant neoplasm of breast: Secondary | ICD-10-CM

## 2020-03-31 DIAGNOSIS — H40011 Open angle with borderline findings, low risk, right eye: Secondary | ICD-10-CM | POA: Diagnosis not present

## 2020-03-31 DIAGNOSIS — H04123 Dry eye syndrome of bilateral lacrimal glands: Secondary | ICD-10-CM | POA: Diagnosis not present

## 2020-03-31 DIAGNOSIS — H25813 Combined forms of age-related cataract, bilateral: Secondary | ICD-10-CM | POA: Diagnosis not present

## 2020-03-31 DIAGNOSIS — H401122 Primary open-angle glaucoma, left eye, moderate stage: Secondary | ICD-10-CM | POA: Diagnosis not present

## 2020-04-07 ENCOUNTER — Ambulatory Visit (INDEPENDENT_AMBULATORY_CARE_PROVIDER_SITE_OTHER): Payer: Medicare HMO

## 2020-04-07 ENCOUNTER — Other Ambulatory Visit: Payer: Self-pay

## 2020-04-07 VITALS — BP 131/73 | HR 80

## 2020-04-07 DIAGNOSIS — I10 Essential (primary) hypertension: Secondary | ICD-10-CM | POA: Diagnosis not present

## 2020-04-07 NOTE — Progress Notes (Unsigned)
Pt here for Blood pressure check per Dr Charlett Blake  Pt currently takes: Amlodipine 2.5 mg  Pt is not taking HCTZ 25   Pt reports compliance with medication instructions  BP today @ = 131/73  HR = 80   Pt advised per Dr Larose Kells (doctor of the day) to continue with the same medication and f/u with Dr. Charlett Blake. (appointment for 04/20/20) .

## 2020-04-10 ENCOUNTER — Encounter: Payer: Self-pay | Admitting: Family Medicine

## 2020-04-13 ENCOUNTER — Other Ambulatory Visit: Payer: Self-pay | Admitting: *Deleted

## 2020-04-13 MED ORDER — CARVEDILOL 3.125 MG PO TABS: 3.1250 mg | ORAL_TABLET | Freq: Two times a day (BID) | ORAL | 1 refills | Status: DC

## 2020-04-13 NOTE — Telephone Encounter (Signed)
Spoke with patient and she d/c amlodipine and start Coreg.  She comes in on 3/29 and will bring some bp and pulse readings at that time.

## 2020-04-20 ENCOUNTER — Ambulatory Visit (INDEPENDENT_AMBULATORY_CARE_PROVIDER_SITE_OTHER): Payer: Medicare HMO | Admitting: Family Medicine

## 2020-04-20 ENCOUNTER — Encounter: Payer: Self-pay | Admitting: Family Medicine

## 2020-04-20 ENCOUNTER — Other Ambulatory Visit: Payer: Self-pay

## 2020-04-20 VITALS — BP 126/72 | HR 78 | Temp 97.8°F | Resp 16 | Wt 126.2 lb

## 2020-04-20 DIAGNOSIS — D508 Other iron deficiency anemias: Secondary | ICD-10-CM

## 2020-04-20 DIAGNOSIS — N390 Urinary tract infection, site not specified: Secondary | ICD-10-CM | POA: Diagnosis not present

## 2020-04-20 DIAGNOSIS — E559 Vitamin D deficiency, unspecified: Secondary | ICD-10-CM | POA: Diagnosis not present

## 2020-04-20 DIAGNOSIS — R251 Tremor, unspecified: Secondary | ICD-10-CM | POA: Diagnosis not present

## 2020-04-20 DIAGNOSIS — M810 Age-related osteoporosis without current pathological fracture: Secondary | ICD-10-CM

## 2020-04-20 DIAGNOSIS — I10 Essential (primary) hypertension: Secondary | ICD-10-CM

## 2020-04-20 DIAGNOSIS — E782 Mixed hyperlipidemia: Secondary | ICD-10-CM | POA: Diagnosis not present

## 2020-04-20 DIAGNOSIS — H348112 Central retinal vein occlusion, right eye, stable: Secondary | ICD-10-CM

## 2020-04-20 LAB — COMPREHENSIVE METABOLIC PANEL
ALT: 12 U/L (ref 0–35)
AST: 20 U/L (ref 0–37)
Albumin: 4.4 g/dL (ref 3.5–5.2)
Alkaline Phosphatase: 68 U/L (ref 39–117)
BUN: 17 mg/dL (ref 6–23)
CO2: 28 mEq/L (ref 19–32)
Calcium: 9.6 mg/dL (ref 8.4–10.5)
Chloride: 105 mEq/L (ref 96–112)
Creatinine, Ser: 0.81 mg/dL (ref 0.40–1.20)
GFR: 71.24 mL/min (ref >60.00)
Glucose, Bld: 85 mg/dL (ref 70–99)
Potassium: 3.8 mEq/L (ref 3.5–5.1)
Sodium: 140 mEq/L (ref 135–145)
Total Bilirubin: 0.6 mg/dL (ref 0.2–1.2)
Total Protein: 7.5 g/dL (ref 6.0–8.3)

## 2020-04-20 LAB — CBC WITH DIFFERENTIAL/PLATELET
Basophils Absolute: 0 10*3/uL (ref 0.0–0.1)
Basophils Relative: 0.6 % (ref 0.0–3.0)
Eosinophils Absolute: 0.2 10*3/uL (ref 0.0–0.7)
Eosinophils Relative: 3.2 % (ref 0.0–5.0)
HCT: 38.2 % (ref 36.0–46.0)
Hemoglobin: 13.1 g/dL (ref 12.0–15.0)
Lymphocytes Relative: 8.3 % — ABNORMAL LOW (ref 12.0–46.0)
Lymphs Abs: 0.5 10*3/uL — ABNORMAL LOW (ref 0.7–4.0)
MCHC: 34.3 g/dL (ref 30.0–36.0)
MCV: 92.6 fl (ref 78.0–100.0)
Monocytes Absolute: 0.4 10*3/uL (ref 0.1–1.0)
Monocytes Relative: 7.1 % (ref 3.0–12.0)
Neutro Abs: 5 10*3/uL (ref 1.4–7.7)
Neutrophils Relative %: 80.8 % — ABNORMAL HIGH (ref 43.0–77.0)
Platelets: 179 10*3/uL (ref 150.0–400.0)
RBC: 4.13 Mil/uL (ref 3.87–5.11)
RDW: 13.9 % (ref 11.5–15.5)
WBC: 6.2 10*3/uL (ref 4.0–10.5)

## 2020-04-20 LAB — TSH: TSH: 2.76 u[IU]/mL (ref 0.35–4.50)

## 2020-04-20 LAB — LIPID PANEL
Cholesterol: 184 mg/dL (ref 0–200)
HDL: 60.8 mg/dL (ref >39.00)
LDL Cholesterol: 96 mg/dL (ref 0–99)
NonHDL: 123.57
Total CHOL/HDL Ratio: 3
Triglycerides: 138 mg/dL (ref 0.0–149.0)
VLDL: 27.6 mg/dL (ref 0.0–40.0)

## 2020-04-20 LAB — VITAMIN D 25 HYDROXY (VIT D DEFICIENCY, FRACTURES): VITD: 38.87 ng/mL (ref 30.00–100.00)

## 2020-04-20 MED ORDER — CARVEDILOL 3.125 MG PO TABS: 3.1250 mg | ORAL_TABLET | Freq: Two times a day (BID) | ORAL | 5 refills | Status: DC

## 2020-04-20 NOTE — Assessment & Plan Note (Signed)
She is now following with [facility], Dr Posey Pronto. She had to have one shot so far and is going to have a shot monthly for awhile. Her regular opthamologist sent her there, [facility], Dr Herby Abraham

## 2020-04-20 NOTE — Assessment & Plan Note (Signed)
Supplement and monitor 

## 2020-04-20 NOTE — Progress Notes (Signed)
Patient ID: Suzanne Turner, female    DOB: [birth year]  Age: 75 y.o. MRN: [id number]    Subjective:  Subjective  HPI [name] presents for office visit today. She states seeing butterflies shaped spots that are twirling that were red and aqua in color in her right eye. She states seeing her Optho because she was concerned about the symptoms and she was dx with a blocked retinal vein and was referred to a retinal specialist who administered her a shot in her eye to break the blockage. She reports not being able to drive because the shot she got was blocking her vision in her right eye. She reports that the symptoms started to diminish after taking the shot and are improving. She denies any chest pain, SOB, fever, bladder pain, cough, chills, sore throat, dysuria, urinary incontinence, back pain, HA, or N/VD.  She states that around the same time she experienced vision symptoms, she also had a UTI. She endorses having shoulder pain, abdominal pain, muscle spasms to the point that she could not move and do activities due to the pain. She reports having a tooth infection that she is seeing a Pharmacist, community for tomorrow. She endorses wanting to take the booster shot for COVID-19.  Review of Systems  Constitutional: Negative for chills, fatigue and fever.  HENT: Negative for congestion, rhinorrhea, sinus pressure, sinus pain and sore throat.   Eyes: Negative for pain.  Respiratory: Negative for cough and shortness of breath.   Cardiovascular: Negative for chest pain, palpitations and leg swelling.  Gastrointestinal: Positive for abdominal pain. Negative for blood in stool, diarrhea, nausea and vomiting.  Genitourinary: Negative for decreased urine volume, difficulty urinating, flank pain, frequency, vaginal bleeding and vaginal discharge.  Musculoskeletal: Negative for back pain.       (+) right shoulder pain  Neurological: Negative for headaches.    History Past Medical History:  Diagnosis Date   . Abnormal thyroid blood test 01/28/2016  . Bronchitis   . Cataract   . Chronic eczema   . Glaucoma    Left eye  . Glaucoma 07/28/2014   Left eye Dr Shon Hough at [facility]  . H/O measles   . H/O mumps   . High cholesterol    no medications  . History of chicken pox   . Hypercalcemia 08/02/2014  . Hypertension   . Kidney stone   . 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 Osteoarthritis   . Osteopenia   . Preventative health care 01/30/2016  . Salivary duct stone   . Skin cancer   . Wears glasses     She has a past surgical history that includes Tonsillectomy (1949); Tubal ligation (1977); Salivary stone removal (Right, 05/15/2012); ORIF tibia & fibula fractures (Right, 1966); Salivary stone removal (N/A, 11/18/2012); Skin biopsy (Right, 12/2012); and Refractive surgery (Left, March/April 2018).   Her family history includes Arrhythmia in her brother, maternal grandmother, maternal uncle, and mother; Arthritis in her father; Breast cancer in her sister; Cancer in her mother and sister; Colon cancer (age of onset: 17) in her mother; Colon polyps in her maternal aunt; Glaucoma in her father, maternal grandmother, and sister; Heart disease in her brother and maternal uncle; Heart disease (age of onset: 63) in her father; Hypertension in her daughter; Tremor in her brother and mother.She reports that she has been smoking cigarettes. She has a 20.00 pack-year smoking history. She has never used smokeless tobacco.  She reports that she does not drink alcohol and does not use drugs.  Current Outpatient Medications on File Prior to Visit  Medication Sig Dispense Refill  . acetaminophen (TYLENOL) 325 MG tablet Take 325 mg by mouth daily as needed for pain.    Marland Kitchen aspirin EC 81 MG tablet Take 81 mg by mouth daily.    . Multiple Vitamins-Minerals (HAIR/SKIN/NAILS/BIOTIN PO) Take 500 mg by mouth 3 (three) times daily.    . potassium  chloride SA (KLOR-CON) 20 MEQ tablet Take 20 mEq by mouth every Monday, Wednesday, and Friday.    . triamcinolone (KENALOG) 0.1 % Apply 1 application topically daily as needed (affected areas).    . VYZULTA 0.024 % SOLN Place 1 drop into both eyes at bedtime.     No current facility-administered medications on file prior to visit.     Objective:  Objective  Physical Exam Constitutional:      General: She is not in acute distress.    Appearance: Normal appearance. She is not ill-appearing or toxic-appearing.  HENT:     Head: Normocephalic and atraumatic.     Right Ear: Tympanic membrane, ear canal and external ear normal.     Left Ear: Tympanic membrane, ear canal and external ear normal.     Nose: No congestion or rhinorrhea.  Eyes:     Extraocular Movements: Extraocular movements intact.     Pupils: Pupils are equal, round, and reactive to light.  Cardiovascular:     Rate and Rhythm: Normal rate and regular rhythm.     Pulses: Normal pulses.     Heart sounds: Normal heart sounds. No murmur heard.   Pulmonary:     Effort: Pulmonary effort is normal. No respiratory distress.     Breath sounds: Normal breath sounds. No wheezing, rhonchi or rales.  Abdominal:     General: Bowel sounds are normal.     Palpations: Abdomen is soft. There is no mass.     Tenderness: There is no abdominal tenderness. There is no guarding.     Hernia: No hernia is present.  Musculoskeletal:        General: Normal range of motion.     Cervical back: Normal range of motion and neck supple.  Skin:    General: Skin is warm and dry.  Neurological:     Mental Status: She is alert and oriented to person, place, and time.  Psychiatric:        Behavior: Behavior normal.    BP 126/72   Pulse 78   Temp 97.8 F (36.6 C)   Resp 16   Wt 126 lb 3.2 oz (57.2 kg)   SpO2 99%   BMI 19.77 kg/m  Wt Readings from Last 3 Encounters:  04/20/20 126 lb 3.2 oz (57.2 kg)  03/22/20 128 lb 8.5 oz (58.3 kg)   10/20/19 128 lb 9.6 oz (58.3 kg)     Lab Results  Component Value Date   WBC 6.2 04/20/2020   HGB 13.1 04/20/2020   HCT 38.2 04/20/2020   PLT 179.0 04/20/2020   GLUCOSE 85 04/20/2020   CHOL 184 04/20/2020   TRIG 138.0 04/20/2020   HDL 60.80 04/20/2020   LDLCALC 96 04/20/2020   ALT 12 04/20/2020   AST 20 04/20/2020   NA 140 04/20/2020   K 3.8 04/20/2020   CL 105 04/20/2020   CREATININE 0.81 04/20/2020   BUN 17 04/20/2020   CO2 28 04/20/2020   TSH 2.76 04/20/2020   INR 1.0  03/22/2020    DG Chest 2 View  Result Date: 03/22/2020 CLINICAL DATA:  Chest pain.  Shortness of breath. EXAM: CHEST - 2 VIEW COMPARISON:  05/03/2012 FINDINGS: Heart size is normal. There is mild prominence of interstitial markings. No focal consolidations or pleural effusions. No pulmonary edema. IMPRESSION: Mild prominence of interstitial markings. Electronically Signed   By: Nolon Nations M.D.   On: 03/22/2020 21:10   CT Angio Chest/Abd/Pel for Dissection W and/or W/WO  Result Date: 03/22/2020 CLINICAL DATA:  Short of breath, hypertension, right back pain EXAM: CT ANGIOGRAPHY CHEST, ABDOMEN AND PELVIS TECHNIQUE: Non-contrast CT of the chest was initially obtained. Multidetector CT imaging through the chest, abdomen and pelvis was performed using the standard protocol during bolus administration of intravenous contrast. Multiplanar reconstructed images and MIPs were obtained and reviewed to evaluate the vascular anatomy. CONTRAST:  172mL OMNIPAQUE IOHEXOL 350 MG/ML SOLN COMPARISON:  03/22/2020, 04/11/2013 FINDINGS: CTA CHEST FINDINGS Cardiovascular: No evidence of thoracic aortic aneurysm or dissection. Moderate atherosclerosis of the descending thoracic aorta. The heart is unremarkable without pericardial effusion. No evidence of pulmonary embolus. Mediastinum/Nodes: Subcarinal adenopathy measures up to 16 mm in short axis. No other pathologic lymph nodes are seen. Trachea, thyroid, and esophagus are  unremarkable. Lungs/Pleura: Upper lobe predominant emphysema. Biapical pleural and parenchymal scarring. No acute airspace disease, effusion, or pneumothorax. Central airways are patent. Musculoskeletal: No acute or destructive bony lesions. Reconstructed images demonstrate no additional findings. Review of the MIP images confirms the above findings. CTA ABDOMEN AND PELVIS FINDINGS VASCULAR Aorta: Normal caliber aorta without aneurysm, dissection, vasculitis or significant stenosis. Celiac: Patent without evidence of aneurysm, dissection, vasculitis or significant stenosis. SMA: Patent without evidence of aneurysm, dissection, vasculitis or significant stenosis. 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 Renals: Both renal arteries are patent without evidence of aneurysm, dissection, vasculitis, fibromuscular dysplasia or significant stenosis. IMA: Patent without evidence of aneurysm, dissection, vasculitis or significant stenosis. Inflow: Patent without evidence of aneurysm, dissection, vasculitis or significant stenosis. Veins: No obvious venous abnormality within the limitations of this arterial phase study. Review of the MIP images confirms the above findings. NON-VASCULAR Hepatobiliary: No focal liver abnormality is seen. No gallstones, gallbladder wall thickening, or biliary dilatation. Pancreas: Unremarkable. No pancreatic ductal dilatation or surrounding inflammatory changes. Spleen: Normal in size without focal abnormality. Adrenals/Urinary Tract: Multiple bilateral renal cysts. No urinary tract calculi or obstructive uropathy. Bladder is unremarkable. The adrenals are normal. Stomach/Bowel: No bowel obstruction or ileus. No bowel wall thickening or inflammatory change. Lymphatic: No pathologic adenopathy within the abdomen or pelvis. Reproductive: Uterus and bilateral adnexa are unremarkable. Other: No free fluid or free gas.  No abdominal wall hernia. Musculoskeletal: No acute or  destructive bony lesions. Reconstructed images demonstrate no additional findings. Review of the MIP images confirms the above findings. IMPRESSION: 1. No evidence of thoracoabdominal aortic aneurysm or dissection. 2. No evidence of pulmonary embolus. 3. Nonspecific subcarinal adenopathy. 4. No acute intra-abdominal or intrapelvic process. 5. Aortic Atherosclerosis (ICD10-I70.0) and Emphysema (ICD10-J43.9). Electronically Signed   By: Randa Ngo M.D.   On: 03/22/2020 22:36     Assessment & Plan:  Plan    Meds ordered this encounter  Medications  . carvedilol (COREG) 3.125 MG tablet    Sig: Take 1 tablet (3.125 mg total) by mouth 2 (two) times daily with a meal.    Dispense:  60 tablet    Refill:  5    Problem List Items Addressed This Visit  Vitamin D deficiency    Supplement and monitor      Relevant Orders   VITAMIN D 25 Hydroxy (Vit-D Deficiency, Fractures) (Completed)   Essential hypertension    Well controlled, no changes to meds. Encouraged heart healthy diet such as the DASH diet and exercise as tolerated.       Relevant Medications   carvedilol (COREG) 3.125 MG tablet   Other Relevant Orders   CBC with Differential/Platelet (Completed)   Comprehensive metabolic panel (Completed)   TSH (Completed)   Osteoporosis    Encouraged to get adequate exercise, calcium and vitamin d intake      Hyperlipidemia    Encouraged heart healthy diet, increase exercise, avoid trans fats, consider a krill oil cap daily      Relevant Medications   carvedilol (COREG) 3.125 MG tablet   Other Relevant Orders   Lipid panel (Completed)   Tremor   Other iron deficiency anemias   Retinal vein occlusion of right eye    She is now following with [facility] specialist, Dr Posey Pronto. She had to have one shot so far and is going to have a shot monthly for awhile. Her regular opthamologist sent her there, [facility], Dr Herby Abraham      Relevant Medications    carvedilol (COREG) 3.125 MG tablet    Other Visit Diagnoses    Urinary tract infection without hematuria, site unspecified    -  Primary   Relevant Orders   Urinalysis   Urine Culture      Follow-up: Return in about 6 months (around 10/21/2020) for annual exam.   I,David Hanna,acting as a scribe for Penni Homans, MD.,have documented all relevant documentation on the behalf of Penni Homans, MD,as directed by  Penni Homans, MD while in the presence of Penni Homans, MD.   I, Mosie Lukes, MD personally performed the services described in this documentation. All medical record entries made by the scribe were at my direction and in my presence. I have reviewed the chart and agree that the record reflects my personal performance and is accurate and complete

## 2020-04-20 NOTE — Patient Instructions (Addendum)
Pneumovax (PCV 23) is what you need   Urinary Tract Infection, Adult  A urinary tract infection (UTI) is an infection of any part of the urinary tract. The urinary tract includes the kidneys, ureters, bladder, and urethra. These organs make, store, and get rid of urine in the body. An upper UTI affects the ureters and kidneys. A lower UTI affects the bladder and urethra. What are the causes? Most urinary tract infections are caused by bacteria in your genital area around your urethra, where urine leaves your body. These bacteria grow and cause inflammation of your urinary tract. What increases the risk? You are more likely to develop this condition if:  You have a urinary catheter that stays in place.  You are not able to control when you urinate or have a bowel movement (incontinence).  You are female and you: ? Use a spermicide or diaphragm for birth control. ? Have low estrogen levels. ? Are pregnant.  You have certain genes that increase your risk.  You are sexually active.  You take antibiotic medicines.  You have a condition that causes your flow of urine to slow down, such as: ? An enlarged prostate, if you are female. ? Blockage in your urethra. ? A kidney stone. ? A nerve condition that affects your bladder control (neurogenic bladder). ? Not getting enough to drink, or not urinating often.  You have certain medical conditions, such as: ? Diabetes. ? A weak disease-fighting system (immunesystem). ? Sickle cell disease. ? Gout. ? Spinal cord injury. What are the signs or symptoms? Symptoms of this condition include:  Needing to urinate right away (urgency).  Frequent urination. This may include small amounts of urine each time you urinate.  Pain or burning with urination.  Blood in the urine.  Urine that smells bad or unusual.  Trouble urinating.  Cloudy urine.  Vaginal discharge, if you are female.  Pain in the abdomen or the lower back. You may also  have:  Vomiting or a decreased appetite.  Confusion.  Irritability or tiredness.  A fever or chills.  Diarrhea. The first symptom in older adults may be confusion. In some cases, they may not have any symptoms until the infection has worsened. How is this diagnosed? This condition is diagnosed based on your medical history and a physical exam. You may also have other tests, including:  Urine tests.  Blood tests.  Tests for STIs (sexually transmitted infections). If you have had more than one UTI, a cystoscopy or imaging studies may be done to determine the cause of the infections. How is this treated? Treatment for this condition includes:  Antibiotic medicine.  Over-the-counter medicines to treat discomfort.  Drinking enough water to stay hydrated. If you have frequent infections or have other conditions such as a kidney stone, you may need to see a health care provider who specializes in the urinary tract (urologist). In rare cases, urinary tract infections can cause sepsis. Sepsis is a life-threatening condition that occurs when the body responds to an infection. Sepsis is treated in the hospital with IV antibiotics, fluids, and other medicines. Follow these instructions at home: Medicines  Take over-the-counter and prescription medicines only as told by your health care provider.  If you were prescribed an antibiotic medicine, take it as told by your health care provider. Do not stop using the antibiotic even if you start to feel better. General instructions  Make sure you: ? Empty your bladder often and completely. Do not hold urine for  long periods of time. ? Empty your bladder after sex. ? Wipe from front to back after urinating or having a bowel movement if you are female. Use each tissue only one time when you wipe.  Drink enough fluid to keep your urine pale yellow.  Keep all follow-up visits. This is important.   Contact a health care provider if:  Your  symptoms do not get better after 1-2 days.  Your symptoms go away and then return. Get help right away if:  You have severe pain in your back or your lower abdomen.  You have a fever or chills.  You have nausea or vomiting. Summary  A urinary tract infection (UTI) is an infection of any part of the urinary tract, which includes the kidneys, ureters, bladder, and urethra.  Most urinary tract infections are caused by bacteria in your genital area.  Treatment for this condition often includes antibiotic medicines.  If you were prescribed an antibiotic medicine, take it as told by your health care provider. Do not stop using the antibiotic even if you start to feel better.  Keep all follow-up visits. This is important. This information is not intended to replace advice given to you by your health care provider. Make sure you discuss any questions you have with your health care provider. Document Revised: 08/22/2019 Document Reviewed: 08/22/2019 Elsevier Patient Education  [place].

## 2020-04-21 NOTE — Assessment & Plan Note (Signed)
Encouraged to get adequate exercise, calcium and vitamin d intake 

## 2020-04-21 NOTE — Assessment & Plan Note (Signed)
Encouraged heart healthy diet, increase exercise, avoid trans fats, consider a krill oil cap daily 

## 2020-04-21 NOTE — Assessment & Plan Note (Signed)
Well controlled, no changes to meds. Encouraged heart healthy diet such as the DASH diet and exercise as tolerated.  °

## 2020-04-22 DIAGNOSIS — H34831 Tributary (branch) retinal vein occlusion, right eye, with macular edema: Secondary | ICD-10-CM | POA: Diagnosis not present

## 2020-05-11 ENCOUNTER — Other Ambulatory Visit: Payer: Self-pay

## 2020-05-11 ENCOUNTER — Ambulatory Visit
Admission: RE | Admit: 2020-05-11 | Discharge: 2020-05-11 | Disposition: A | Discharge status: Home / Self Care | Payer: Medicare HMO | Source: Ambulatory Visit

## 2020-05-11 DIAGNOSIS — Z1231 Encounter for screening mammogram for malignant neoplasm of breast: Secondary | ICD-10-CM

## 2020-05-22 DIAGNOSIS — R300 Dysuria: Secondary | ICD-10-CM | POA: Diagnosis not present

## 2020-05-22 DIAGNOSIS — N390 Urinary tract infection, site not specified: Secondary | ICD-10-CM | POA: Diagnosis not present

## 2020-05-24 ENCOUNTER — Telehealth: Payer: Self-pay

## 2020-05-24 ENCOUNTER — Telehealth: Payer: Self-pay | Admitting: *Deleted

## 2020-05-24 DIAGNOSIS — R300 Dysuria: Secondary | ICD-10-CM

## 2020-05-24 DIAGNOSIS — R350 Frequency of micturition: Secondary | ICD-10-CM

## 2020-05-24 NOTE — Telephone Encounter (Signed)
Yes UA and culture is good

## 2020-05-24 NOTE — Telephone Encounter (Signed)
Who Is Calling Patient / Member / Family / Caregiver Call Type Triage / Clinical Relationship To Patient Self Return Phone Number [phone] (Primary) Chief Complaint Flank Pain Reason for Call Symptomatic / Request for Health Information Initial Comment Caller states she is developing a UTI. Was in the ER at the end of February with one and now has developed the same symptoms. Additional Comment Pain in neck, urinary frequency, and exhaustion, urine super hot Translation No Nurse Assessment Nurse: Hassell Done, RN, Melanie Date/Time (Eastern Time): 05/21/2020 7:54:04 PM Confirm and document reason for call. If symptomatic, describe symptoms. ---Caller states she has pain in her neck and when she had a UTI in February she had these same symptoms. Has urinary frequency. Got an at home kit and showing WBC. No fever

## 2020-05-24 NOTE — Telephone Encounter (Signed)
Left message on machine to call back  

## 2020-05-24 NOTE — Telephone Encounter (Signed)
Spoke with pt, she went to [facility] and they gave her cipro. Pt will call back if she has any issues.

## 2020-05-24 NOTE — Telephone Encounter (Signed)
Looks like she saw you 04/20/20, we did not get a urine that day.    Are you ok with her just coming in to doing a urine first?

## 2020-05-25 NOTE — Telephone Encounter (Signed)
Spoke with pt, she went to [facility] and they gave her cipro. Pt will call back if she has any issues. 

## 2020-05-27 DIAGNOSIS — H34831 Tributary (branch) retinal vein occlusion, right eye, with macular edema: Secondary | ICD-10-CM | POA: Diagnosis not present

## 2020-06-01 ENCOUNTER — Telehealth: Payer: Self-pay

## 2020-06-01 DIAGNOSIS — R350 Frequency of micturition: Secondary | ICD-10-CM

## 2020-06-01 DIAGNOSIS — R300 Dysuria: Secondary | ICD-10-CM

## 2020-06-01 NOTE — Telephone Encounter (Signed)
FYI  Pt is still having urine issue. I got her schedule for tomorrow to collect her urine and orders are in.

## 2020-06-01 NOTE — Telephone Encounter (Signed)
TY

## 2020-06-01 NOTE — Addendum Note (Signed)
Addended by: Randolm Idol A on: 06/01/2020 12:05 PM   Modules accepted: Orders

## 2020-06-01 NOTE — Telephone Encounter (Signed)
error 

## 2020-06-02 ENCOUNTER — Other Ambulatory Visit: Payer: Self-pay

## 2020-06-02 ENCOUNTER — Other Ambulatory Visit (INDEPENDENT_AMBULATORY_CARE_PROVIDER_SITE_OTHER): Payer: Medicare HMO

## 2020-06-02 DIAGNOSIS — R350 Frequency of micturition: Secondary | ICD-10-CM | POA: Diagnosis not present

## 2020-06-02 DIAGNOSIS — R300 Dysuria: Secondary | ICD-10-CM | POA: Diagnosis not present

## 2020-06-02 LAB — URINALYSIS, ROUTINE W REFLEX MICROSCOPIC
Bilirubin Urine: NEGATIVE
Hgb urine dipstick: NEGATIVE
Ketones, ur: NEGATIVE
Nitrite: NEGATIVE
Specific Gravity, Urine: 1.01 (ref 1.000–1.030)
Total Protein, Urine: NEGATIVE
Urine Glucose: NEGATIVE
Urobilinogen, UA: 0.2 (ref 0.0–1.0)
pH: 6.5 (ref 5.0–8.0)

## 2020-06-03 LAB — URINE CULTURE
MICRO NUMBER:: 11877086
SPECIMEN QUALITY:: ADEQUATE

## 2020-07-01 DIAGNOSIS — H34831 Tributary (branch) retinal vein occlusion, right eye, with macular edema: Secondary | ICD-10-CM | POA: Diagnosis not present

## 2020-07-07 ENCOUNTER — Ambulatory Visit: Payer: Medicare Other | Admitting: *Deleted

## 2020-07-15 ENCOUNTER — Ambulatory Visit: Payer: Medicare HMO

## 2020-07-15 ENCOUNTER — Other Ambulatory Visit: Payer: Self-pay

## 2020-07-15 ENCOUNTER — Ambulatory Visit (INDEPENDENT_AMBULATORY_CARE_PROVIDER_SITE_OTHER): Payer: Medicare HMO

## 2020-07-15 VITALS — BP 144/88 | HR 76 | Temp 97.1°F | Resp 16 | Ht 67.0 in | Wt 126.8 lb

## 2020-07-15 DIAGNOSIS — Z0000 Encounter for general adult medical examination without abnormal findings: Secondary | ICD-10-CM | POA: Diagnosis not present

## 2020-07-15 NOTE — Progress Notes (Signed)
Subjective:   Suzanne Turner is a 75 y.o. female who presents for Medicare Annual (Subsequent) preventive examination.  Review of Systems     Cardiac Risk Factors include: advanced age (>76men, >84 women);hypertension;dyslipidemia     Objective:    Today's Vitals   07/15/20 1451  BP: (!) 144/88  Pulse: 76  Resp: 16  Temp: (!) 97.1 F (36.2 C)  TempSrc: Temporal  SpO2: 98%  Weight: 126 lb 12.8 oz (57.5 kg)  Height: 5\' 7"  (1.702 m)   Body mass index is 19.86 kg/m.  Advanced Directives 07/15/2020 07/07/2019 07/05/2018 07/03/2017 06/29/2016 08/01/2013 11/12/2012  Does Patient Have a Medical Advance Directive? Yes Yes Yes Yes Yes Patient has advance directive, copy not in chart Patient has advance directive, copy not in chart  Type of Advance Directive [place];Living will [place];Living will [place];Living will [place];Living will [place];Living will Living will;Healthcare Power of Attorney Living will  Does patient want to make changes to medical advance directive? - No - Patient declined No - Patient declined - - - -  Copy of [place] in Chart? Yes - validated most recent copy scanned in chart (See row information) Yes - validated most recent copy scanned in chart (See row information) Yes - validated most recent copy scanned in chart (See row information) No - copy requested No - copy requested - -  Pre-existing out of facility DNR order (yellow form or pink MOST form) - - - - - - -    Current Medications (verified) Outpatient Encounter Medications as of 07/15/2020  Medication Sig   acetaminophen (TYLENOL) 325 MG tablet Take 325 mg by mouth daily as needed for pain.   aspirin EC 81 MG tablet Take 81 mg by mouth daily.   carvedilol (COREG) 3.125 MG tablet Take 1 tablet (3.125 mg total) by mouth 2 (two) times daily with a meal.   Multiple Vitamins-Minerals  (HAIR/SKIN/NAILS/BIOTIN PO) Take 500 mg by mouth 3 (three) times daily.   triamcinolone (KENALOG) 0.1 % Apply 1 application topically daily as needed (affected areas).   VYZULTA 0.024 % SOLN Place 1 drop into both eyes at bedtime.   potassium chloride SA (KLOR-CON) 20 MEQ tablet Take 20 mEq by mouth every Monday, Wednesday, and Friday. (Patient not taking: Reported on 07/15/2020)   No facility-administered encounter medications on file as of 07/15/2020.    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   Left eye Dr Shon Hough at [facility]    Osteoarthritis    Osteopenia    Preventative health care 01/30/2016   Salivary duct stone    Skin cancer    Wears glasses    Past Surgical History:  Procedure Laterality Date   ORIF TIBIA & FIBULA FRACTURES Right 1966   REFRACTIVE SURGERY Left March/April 2018   SALIVARY STONE REMOVAL  Right 05/15/2012   Procedure: EXCISION OF RIGHT SUBMANDIBULAR GLAND ;  Surgeon: Izora Gala, MD;  Location: [facility] OR;  Service: ENT;  Laterality: Right;   SALIVARY STONE REMOVAL N/A 11/18/2012   Procedure: INTRAORAL EXCISION SUBMANDIBULAR STONE RIGHT ;  Surgeon: Izora Gala, MD;  Location: [place];  Service: ENT;  Laterality: N/A;   SKIN BIOPSY Right 12/2012   legs   TONSILLECTOMY  1949   TUBAL LIGATION  1977   Family History  Problem Relation Age of Onset   Arrhythmia Mother    Colon cancer Mother 53   Cancer Mother     Tremor Mother    Arrhythmia Maternal Grandmother    Glaucoma Maternal Grandmother    Breast cancer Sister    Cancer Sister        breast   Glaucoma Sister    Heart disease Father 46   Arthritis Father        broken back with chronic pain   Glaucoma Father    Heart disease Brother    Arrhythmia Brother    Tremor Brother    Hypertension Daughter    Arrhythmia Maternal Uncle    Heart disease Maternal Uncle    Colon polyps Maternal Aunt    Social History   Socioeconomic History   Marital status: Widowed    Spouse name: Not on file   Number of children: Not on file   Years of education: Not on file   Highest education level: Not on file  Occupational History   Not on file  Tobacco Use   Smoking status: Every Day    Packs/day: 0.50    Years: 40.00    Pack years: 20.00    Types: Cigarettes   Smokeless tobacco: Never  Substance and Sexual Activity   Alcohol use: No   Drug use: No   Sexual activity: Not Currently    Comment: lives alone, widowed, retired from [employer], education. and real estate, no dietary restrictions  Other Topics Concern   Not on file  Social History Narrative   Not on file   Social Determinants of Health   Financial Resource Strain: Low Risk    Difficulty of Paying Living Expenses: Not hard at all  Food Insecurity: No Food Insecurity   Worried About Charity fundraiser in the Last Year: Never true   Arboriculturist in the Last Year: Never true  Transportation Needs: No Transportation Needs   Lack of Transportation (Medical): No   Lack of Transportation (Non-Medical): No  Physical Activity: Sufficiently Active   Days of Exercise per Week: 5 days   Minutes of Exercise per Session: 60 min  Stress: No Stress Concern Present   Feeling of Stress : Not at all  Social Connections: Socially Isolated   Frequency of Communication with Friends and Family: More than three times a week   Frequency of Social Gatherings with Friends and Family:  More than three times a week   Attends Religious Services: Never   Marine scientist or Organizations: No   Attends Archivist Meetings: Never   Marital Status: Widowed    Tobacco Counseling Ready to quit: Not Answered Counseling given: Not Answered   Clinical Intake:  Pre-visit preparation completed: Yes  Pain : No/denies pain     Nutritional Status: BMI of 19-24  Normal Nutritional Risks: None Diabetes: No  How often do you need to have someone help you when you read instructions, pamphlets, or  other written materials from your doctor or pharmacy?: 1 - Never  Diabetic?No  Interpreter Needed?: No  Information entered by :: Caroleen Hamman LPn   Activities of Daily Living In your present state of health, do you have any difficulty performing the following activities: 07/15/2020  Hearing? N  Vision? N  Difficulty concentrating or making decisions? Y  Comment occasionally forgets a word she is trying to think of  Walking or climbing stairs? N  Dressing or bathing? N  Doing errands, shopping? N  Preparing Food and eating ? N  Using the Toilet? N  In the past six months, have you accidently leaked urine? Y  Comment occasionally  Do you have problems with loss of bowel control? N  Managing your Medications? N  Managing your Finances? N  Housekeeping or managing your Housekeeping? N  Some recent data might be hidden    Patient Care Team: Mosie Lukes, MD as PCP - General (Family Medicine) Larey Dresser, MD as Consulting Physician (Cardiology) Shon Hough, MD as Consulting Physician (Ophthalmology) Sherrlyn Hock. Gordy Levan, DDS as Consulting Physician (Dentistry)  Indicate any recent Medical Services you may have received from other than [facility] providers in the past year (date may be approximate).     Assessment:   This is a routine wellness examination for Seline.  Hearing/Vision screen Hearing Screening - Comments:: No issues Vision  Screening - Comments:: Wears glasses Last eye exam-02/2020-[facility]  Dietary issues and exercise activities discussed: Current Exercise Habits: Home exercise routine, Type of exercise: yoga, Time (Minutes): 60, Frequency (Times/Week): 5, Weekly Exercise (Minutes/Week): 300, Intensity: Mild, Exercise limited by: None identified   Goals Addressed             This Visit's Progress    Maintain healthy active lifestyle.   On track      Depression Screen PHQ 2/9 Scores 07/15/2020 07/07/2019 07/05/2018 07/03/2017 03/05/2017 06/29/2016 01/28/2016  PHQ - 2 Score 1 0 0 0 0 0 0    Fall Risk Fall Risk  07/15/2020 07/07/2019 07/05/2018 07/03/2017 03/05/2017  Falls in the past year? 0 1 0 No No  Number falls in past yr: 0 0 - - -  Injury with Fall? 0 0 - - -  Risk for fall due to : - - - - -  Follow up Falls prevention discussed Education provided;Falls prevention discussed - - -    FALL RISK PREVENTION PERTAINING TO THE HOME:  Any stairs in or around the home? Yes  If so, are there any without handrails? No  Home free of loose throw rugs in walkways, pet beds, electrical cords, etc? Yes  Adequate lighting in your home to reduce risk of falls? Yes   ASSISTIVE DEVICES UTILIZED TO PREVENT FALLS:  Life alert? No  Use of a cane, walker or w/c? No  Grab bars in the bathroom? No  Shower chair or bench in shower? No  Elevated toilet seat or a handicapped toilet? No   TIMED UP AND GO:  Was the test performed? Yes .  Length of time to ambulate 10 feet: 10 sec.   Gait steady and fast without use of assistive device  Cognitive Function:Normal cognitive status assessed by direct observation by this Nurse Health Advisor. No abnormalities found.   MMSE - Mini Mental State Exam 06/29/2016  Orientation to time 4  Orientation to Place 5  Registration 3  Attention/ Calculation 4  Recall 3  Language- name 2 objects 2  Language- repeat 1  Language- follow 3 step command 3  Language- read  & follow direction 1  Write a sentence 1  Copy design 1  Total score 28     6CIT Screen 07/15/2020  What Year? 0 points  What month? 0 points  What time? 0 points  Count back from 20 0 points  Months in reverse 0 points  Repeat phrase 2 points  Total Score 2    Immunizations Immunization History  Administered Date(s) Administered   Fluad Quad(high Dose 65+) 10/20/2019   Influenza Split 10/23/2010   Influenza, High Dose Seasonal PF 11/13/2016, 11/13/2017   Influenza,inj,Quad PF,6+ Mos 12/11/2012   Influenza-Unspecified 11/05/2013, 11/16/2014   PFIZER(Purple Top)SARS-COV-2 Vaccination 03/01/2019, 03/26/2019, 11/22/2019, 05/21/2020   Pneumococcal Conjugate-13 06/23/2010, 05/11/2014   Tdap 04/01/2014   Zoster Recombinat (Shingrix) 10/12/2017, 12/14/2017   Zoster, Live 07/24/2006    TDAP status: Up to date  Flu Vaccine status: Up to date  Pneumococcal vaccine status: Due, Education has been provided regarding the importance of this vaccine. Advised may receive this vaccine at local pharmacy or Health Dept. Aware to provide a copy of the vaccination record if obtained from local pharmacy or Health Dept. Verbalized acceptance and understanding.  Covid-19 vaccine status: Completed vaccines  Qualifies for Shingles Vaccine? No   Zostavax completed Yes   Shingrix Completed?: Yes  Screening Tests Health Maintenance  Topic Date Due   COLONOSCOPY (Pts 45-56yrs Insurance coverage will need to be confirmed)  08/16/2018   PNA vac Low Risk Adult (2 of 2 - PPSV23) 04/20/2021 (Originally 05/11/2015)   INFLUENZA VACCINE  08/23/2020   COVID-19 Vaccine (5 - Booster for Pfizer series) 09/20/2020   TETANUS/TDAP  03/31/2024   DEXA SCAN  Completed   Hepatitis C Screening  Completed   Zoster Vaccines- Shingrix  Completed   HPV VACCINES  Aged Out    Health Maintenance  Health Maintenance Due  Topic Date Due   COLONOSCOPY (Pts 45-66yrs Insurance coverage will need to be confirmed)   08/16/2018    Colorectal cancer screening: Type of screening: Colonoscopy. Completed 07/2013. Repeat every 10 years per patient  Mammogram status: Completed Bilateral 05/11/2020. Repeat every year  Bone Density status: Completed 05/28/2019. Results reflect: Bone density results: OSTEOPOROSIS. Repeat every 2 years.  Lung Cancer Screening: (Low Dose CT Chest recommended if Age 2-80 years, 30 pack-year currently smoking OR have quit w/in 15years.) does not qualify.     Additional Screening:  Hepatitis C Screening: Completed 03/05/2017  Vision Screening: Recommended annual ophthalmology exams for early detection of glaucoma and other disorders of the eye. Is the patient up to date with their annual eye exam?  Yes  Who is the provider or what is the name of the office in which the patient attends annual eye exams? [facility]   Dental Screening: Recommended annual dental exams for proper oral hygiene  Community Resource Referral / Chronic Care Management: CRR required this visit?  No   CCM required this visit?  No      Plan:     I have personally reviewed and noted the following in the patient's chart:   Medical and social history Use of alcohol, tobacco or illicit drugs  Current medications and supplements including opioid prescriptions.  Functional ability and status Nutritional status Physical activity Advanced directives List of other physicians Hospitalizations, surgeries, and ER visits in previous 12 months Vitals Screenings to include cognitive, depression, and falls Referrals and appointments  In addition, I have reviewed and discussed with patient certain  preventive protocols, quality metrics, and best practice recommendations. A written personalized care plan for preventive services as well as general preventive health recommendations were provided to patient.   Patient would like to access avs on mychart.  Marta Antu, LPN   [id number]  Nurse  Health Advisor  Nurse Notes: None

## 2020-07-15 NOTE — Patient Instructions (Addendum)
Suzanne Turner , Thank you for taking time to come for your Medicare Wellness Visit. I appreciate your ongoing commitment to your health goals. Please review the following plan we discussed and let me know if I can assist you in the future.   Screening recommendations/referrals: Colonoscopy: Completed 08/15/2013-Per our conversation, please follow recommendations from GI for repeat colonoscopy. Mammogram: Completed 05/11/2020-Due 05/11/2021 Bone Density: Completed 05/28/2019-Due 05/27/2021 Recommended yearly ophthalmology/optometry visit for glaucoma screening and checkup Recommended yearly dental visit for hygiene and checkup  Vaccinations: Influenza vaccine: Up to date Pneumococcal vaccine: Due-May obtain vaccine at our office or your local pharmacy. Tdap vaccine: Up to date-Due-03/31/2024 Shingles vaccine: Completed vaccines   Covid-19:Up to date  Advanced directives: Copy in chart  Conditions/risks identified: See problem list  Next appointment: Follow up in one year for your annual wellness visit 07/21/2021 @ 11:00   Preventive Care 75 Years and Older, Female Preventive care refers to lifestyle choices and visits with your health care provider that can promote health and wellness. What does preventive care include? A yearly physical exam. This is also called an annual well check. Dental exams once or twice a year. Routine eye exams. Ask your health care provider how often you should have your eyes checked. Personal lifestyle choices, including: Daily care of your teeth and gums. Regular physical activity. Eating a healthy diet. Avoiding tobacco and drug use. Limiting alcohol use. Practicing safe sex. Taking low-dose aspirin every day. Taking vitamin and mineral supplements as recommended by your health care provider. What happens during an annual well check? The services and screenings done by your health care provider during your annual well check will depend on your age, overall  health, lifestyle risk factors, and family history of disease. Counseling  Your health care provider may ask you questions about your: Alcohol use. Tobacco use. Drug use. Emotional well-being. Home and relationship well-being. Sexual activity. Eating habits. History of falls. Memory and ability to understand (cognition). Work and work Statistician. Reproductive health. Screening  You may have the following tests or measurements: Height, weight, and BMI. Blood pressure. Lipid and cholesterol levels. These may be checked every 5 years, or more frequently if you are over 16 years old. Skin check. Lung cancer screening. You may have this screening every year starting at age 75 if you have a 30-pack-year history of smoking and currently smoke or have quit within the past 15 years. Fecal occult blood test (FOBT) of the stool. You may have this test every year starting at age 84. Flexible sigmoidoscopy or colonoscopy. You may have a sigmoidoscopy every 5 years or a colonoscopy every 10 years starting at age 60. Hepatitis C blood test. Hepatitis B blood test. Sexually transmitted disease (STD) testing. Diabetes screening. This is done by checking your blood sugar (glucose) after you have not eaten for a while (fasting). You may have this done every 1-3 years. Bone density scan. This is done to screen for osteoporosis. You may have this done starting at age 75. Mammogram. This may be done every 1-2 years. Talk to your health care provider about how often you should have regular mammograms. Talk with your health care provider about your test results, treatment options, and if necessary, the need for more tests. Vaccines  Your health care provider may recommend certain vaccines, such as: Influenza vaccine. This is recommended every year. Tetanus, diphtheria, and acellular pertussis (Tdap, Td) vaccine. You may need a Td booster every 10 years. Zoster vaccine. You may need this after  age  32. Pneumococcal 13-valent conjugate (PCV13) vaccine. One dose is recommended after age 75. Pneumococcal polysaccharide (PPSV23) vaccine. One dose is recommended after age 75. Talk to your health care provider about which screenings and vaccines you need and how often you need them. This information is not intended to replace advice given to you by your health care provider. Make sure you discuss any questions you have with your health care provider. Document Released: 02/05/2015 Document Revised: 09/29/2015 Document Reviewed: 11/10/2014 Elsevier Interactive Patient Education  2017 [place] Prevention in the Home Falls can cause injuries. They can happen to people of all ages. There are many things you can do to make your home safe and to help prevent falls. What can I do on the outside of my home? Regularly fix the edges of walkways and driveways and fix any cracks. Remove anything that might make you trip as you walk through a door, such as a raised step or threshold. Trim any bushes or trees on the path to your home. Use bright outdoor lighting. Clear any walking paths of anything that might make someone trip, such as rocks or tools. Regularly check to see if handrails are loose or broken. Make sure that both sides of any steps have handrails. Any raised decks and porches should have guardrails on the edges. Have any leaves, snow, or ice cleared regularly. Use sand or salt on walking paths during winter. Clean up any spills in your garage right away. This includes oil or grease spills. What can I do in the bathroom? Use night lights. Install grab bars by the toilet and in the tub and shower. Do not use towel bars as grab bars. Use non-skid mats or decals in the tub or shower. If you need to sit down in the shower, use a plastic, non-slip stool. Keep the floor dry. Clean up any water that spills on the floor as soon as it happens. Remove soap buildup in the tub or shower  regularly. Attach bath mats securely with double-sided non-slip rug tape. Do not have throw rugs and other things on the floor that can make you trip. What can I do in the bedroom? Use night lights. Make sure that you have a light by your bed that is easy to reach. Do not use any sheets or blankets that are too big for your bed. They should not hang down onto the floor. Have a firm chair that has side arms. You can use this for support while you get dressed. Do not have throw rugs and other things on the floor that can make you trip. What can I do in the kitchen? Clean up any spills right away. Avoid walking on wet floors. Keep items that you use a lot in easy-to-reach places. If you need to reach something above you, use a strong step stool that has a grab bar. Keep electrical cords out of the way. Do not use floor polish or wax that makes floors slippery. If you must use wax, use non-skid floor wax. Do not have throw rugs and other things on the floor that can make you trip. What can I do with my stairs? Do not leave any items on the stairs. Make sure that there are handrails on both sides of the stairs and use them. Fix handrails that are broken or loose. Make sure that handrails are as long as the stairways. Check any carpeting to make sure that it is firmly attached to the stairs.  Fix any carpet that is loose or worn. Avoid having throw rugs at the top or bottom of the stairs. If you do have throw rugs, attach them to the floor with carpet tape. Make sure that you have a light switch at the top of the stairs and the bottom of the stairs. If you do not have them, ask someone to add them for you. What else can I do to help prevent falls? Wear shoes that: Do not have high heels. Have rubber bottoms. Are comfortable and fit you well. Are closed at the toe. Do not wear sandals. If you use a stepladder: Make sure that it is fully opened. Do not climb a closed stepladder. Make sure that  both sides of the stepladder are locked into place. Ask someone to hold it for you, if possible. Clearly mark and make sure that you can see: Any grab bars or handrails. First and last steps. Where the edge of each step is. Use tools that help you move around (mobility aids) if they are needed. These include: Canes. Walkers. Scooters. Crutches. Turn on the lights when you go into a dark area. Replace any light bulbs as soon as they burn out. Set up your furniture so you have a clear path. Avoid moving your furniture around. If any of your floors are uneven, fix them. If there are any pets around you, be aware of where they are. Review your medicines with your doctor. Some medicines can make you feel dizzy. This can increase your chance of falling. Ask your doctor what other things that you can do to help prevent falls. This information is not intended to replace advice given to you by your health care provider. Make sure you discuss any questions you have with your health care provider. Document Released: 11/05/2008 Document Revised: 06/17/2015 Document Reviewed: 02/13/2014 Elsevier Interactive Patient Education  2017 [employer].

## 2020-07-29 DIAGNOSIS — H34831 Tributary (branch) retinal vein occlusion, right eye, with macular edema: Secondary | ICD-10-CM | POA: Diagnosis not present

## 2020-08-19 ENCOUNTER — Other Ambulatory Visit: Payer: Self-pay | Admitting: Family Medicine

## 2020-09-01 DIAGNOSIS — H5213 Myopia, bilateral: Secondary | ICD-10-CM | POA: Diagnosis not present

## 2020-09-01 DIAGNOSIS — H2513 Age-related nuclear cataract, bilateral: Secondary | ICD-10-CM | POA: Diagnosis not present

## 2020-09-01 DIAGNOSIS — H401122 Primary open-angle glaucoma, left eye, moderate stage: Secondary | ICD-10-CM | POA: Diagnosis not present

## 2020-09-01 DIAGNOSIS — H349 Unspecified retinal vascular occlusion: Secondary | ICD-10-CM | POA: Diagnosis not present

## 2020-09-02 DIAGNOSIS — H34831 Tributary (branch) retinal vein occlusion, right eye, with macular edema: Secondary | ICD-10-CM | POA: Diagnosis not present

## 2020-09-29 ENCOUNTER — Encounter: Payer: Self-pay | Admitting: Family Medicine

## 2020-10-06 DIAGNOSIS — H34831 Tributary (branch) retinal vein occlusion, right eye, with macular edema: Secondary | ICD-10-CM | POA: Diagnosis not present

## 2020-10-18 DIAGNOSIS — H35033 Hypertensive retinopathy, bilateral: Secondary | ICD-10-CM | POA: Diagnosis not present

## 2020-10-18 DIAGNOSIS — H34831 Tributary (branch) retinal vein occlusion, right eye, with macular edema: Secondary | ICD-10-CM | POA: Diagnosis not present

## 2020-10-18 DIAGNOSIS — H35011 Changes in retinal vascular appearance, right eye: Secondary | ICD-10-CM | POA: Diagnosis not present

## 2020-10-18 DIAGNOSIS — H3581 Retinal edema: Secondary | ICD-10-CM | POA: Diagnosis not present

## 2020-10-18 DIAGNOSIS — H3561 Retinal hemorrhage, right eye: Secondary | ICD-10-CM | POA: Diagnosis not present

## 2020-11-01 ENCOUNTER — Other Ambulatory Visit: Payer: Self-pay

## 2020-11-01 ENCOUNTER — Other Ambulatory Visit: Payer: Self-pay | Admitting: Family Medicine

## 2020-11-01 ENCOUNTER — Other Ambulatory Visit (INDEPENDENT_AMBULATORY_CARE_PROVIDER_SITE_OTHER): Payer: Medicare HMO

## 2020-11-01 DIAGNOSIS — N390 Urinary tract infection, site not specified: Secondary | ICD-10-CM | POA: Diagnosis not present

## 2020-11-01 LAB — URINALYSIS, ROUTINE W REFLEX MICROSCOPIC
Bilirubin Urine: NEGATIVE
Hgb urine dipstick: NEGATIVE
Ketones, ur: NEGATIVE
Nitrite: NEGATIVE
RBC / HPF: NONE SEEN (ref 0–?)
Specific Gravity, Urine: 1.01 (ref 1.000–1.030)
Total Protein, Urine: NEGATIVE
Urine Glucose: NEGATIVE
Urobilinogen, UA: 0.2 (ref 0.0–1.0)
pH: 7 (ref 5.0–8.0)

## 2020-11-01 MED ORDER — CEFDINIR 300 MG PO CAPS: 300.0000 mg | ORAL_CAPSULE | Freq: Two times a day (BID) | ORAL | 0 refills | Status: AC

## 2020-11-02 LAB — URINE CULTURE
MICRO NUMBER:: 12481710
SPECIMEN QUALITY:: ADEQUATE

## 2020-11-08 ENCOUNTER — Other Ambulatory Visit: Payer: Self-pay

## 2020-11-09 ENCOUNTER — Encounter: Payer: Self-pay | Admitting: Family Medicine

## 2020-11-09 ENCOUNTER — Ambulatory Visit (INDEPENDENT_AMBULATORY_CARE_PROVIDER_SITE_OTHER): Payer: Medicare HMO | Admitting: Family Medicine

## 2020-11-09 VITALS — BP 112/68 | HR 72 | Temp 97.9°F | Resp 16 | Ht 67.0 in | Wt 127.6 lb

## 2020-11-09 DIAGNOSIS — E782 Mixed hyperlipidemia: Secondary | ICD-10-CM

## 2020-11-09 DIAGNOSIS — Z0000 Encounter for general adult medical examination without abnormal findings: Secondary | ICD-10-CM

## 2020-11-09 DIAGNOSIS — M810 Age-related osteoporosis without current pathological fracture: Secondary | ICD-10-CM | POA: Diagnosis not present

## 2020-11-09 DIAGNOSIS — I10 Essential (primary) hypertension: Secondary | ICD-10-CM | POA: Diagnosis not present

## 2020-11-09 DIAGNOSIS — D508 Other iron deficiency anemias: Secondary | ICD-10-CM

## 2020-11-09 DIAGNOSIS — Z7189 Other specified counseling: Secondary | ICD-10-CM

## 2020-11-09 DIAGNOSIS — R350 Frequency of micturition: Secondary | ICD-10-CM | POA: Diagnosis not present

## 2020-11-09 DIAGNOSIS — Z79899 Other long term (current) drug therapy: Secondary | ICD-10-CM | POA: Diagnosis not present

## 2020-11-09 DIAGNOSIS — E559 Vitamin D deficiency, unspecified: Secondary | ICD-10-CM | POA: Diagnosis not present

## 2020-11-09 DIAGNOSIS — Z87440 Personal history of urinary (tract) infections: Secondary | ICD-10-CM

## 2020-11-09 LAB — HEMOGLOBIN A1C: Hgb A1c MFr Bld: 5.6 % (ref 4.6–6.5)

## 2020-11-09 LAB — COMPREHENSIVE METABOLIC PANEL
ALT: 14 U/L (ref 0–35)
AST: 22 U/L (ref 0–37)
Albumin: 4.3 g/dL (ref 3.5–5.2)
Alkaline Phosphatase: 63 U/L (ref 39–117)
BUN: 15 mg/dL (ref 6–23)
CO2: 26 mEq/L (ref 19–32)
Calcium: 9.3 mg/dL (ref 8.4–10.5)
Chloride: 104 mEq/L (ref 96–112)
Creatinine, Ser: 0.74 mg/dL (ref 0.40–1.20)
GFR: 79.09 mL/min (ref >60.00)
Glucose, Bld: 78 mg/dL (ref 70–99)
Potassium: 3.7 mEq/L (ref 3.5–5.1)
Sodium: 136 mEq/L (ref 135–145)
Total Bilirubin: 0.6 mg/dL (ref 0.2–1.2)
Total Protein: 7.3 g/dL (ref 6.0–8.3)

## 2020-11-09 LAB — CBC WITH DIFFERENTIAL/PLATELET
Basophils Absolute: 0 10*3/uL (ref 0.0–0.1)
Basophils Relative: 0.6 % (ref 0.0–3.0)
Eosinophils Absolute: 0.1 10*3/uL (ref 0.0–0.7)
Eosinophils Relative: 1.3 % (ref 0.0–5.0)
HCT: 37.5 % (ref 36.0–46.0)
Hemoglobin: 12.6 g/dL (ref 12.0–15.0)
Lymphocytes Relative: 8.5 % — ABNORMAL LOW (ref 12.0–46.0)
Lymphs Abs: 0.4 10*3/uL — ABNORMAL LOW (ref 0.7–4.0)
MCHC: 33.7 g/dL (ref 30.0–36.0)
MCV: 94.4 fl (ref 78.0–100.0)
Monocytes Absolute: 0.3 10*3/uL (ref 0.1–1.0)
Monocytes Relative: 7.8 % (ref 3.0–12.0)
Neutro Abs: 3.6 10*3/uL (ref 1.4–7.7)
Neutrophils Relative %: 81.8 % — ABNORMAL HIGH (ref 43.0–77.0)
Platelets: 199 10*3/uL (ref 150.0–400.0)
RBC: 3.97 Mil/uL (ref 3.87–5.11)
RDW: 13.2 % (ref 11.5–15.5)
WBC: 4.4 10*3/uL (ref 4.0–10.5)

## 2020-11-09 LAB — LIPID PANEL
Cholesterol: 186 mg/dL (ref 0–200)
HDL: 65.7 mg/dL (ref >39.00)
LDL Cholesterol: 101 mg/dL — ABNORMAL HIGH (ref 0–99)
NonHDL: 120.7
Total CHOL/HDL Ratio: 3
Triglycerides: 100 mg/dL (ref 0.0–149.0)
VLDL: 20 mg/dL (ref 0.0–40.0)

## 2020-11-09 LAB — TSH: TSH: 2.34 u[IU]/mL (ref 0.35–5.50)

## 2020-11-09 NOTE — Assessment & Plan Note (Signed)
Encourage heart healthy diet such as MIND or DASH diet, increase exercise, avoid trans fats, simple carbohydrates and processed foods, consider a krill or fish or flaxseed oil cap daily.  °

## 2020-11-09 NOTE — Assessment & Plan Note (Addendum)
Patient is struggling financially on a fixed income and is having trouble affording her medications and paying her bills. Will refer for CCM services to see if there is any assistance out there for the patient.

## 2020-11-09 NOTE — Assessment & Plan Note (Signed)
Supplement and monitor 

## 2020-11-09 NOTE — Assessment & Plan Note (Signed)
Well controlled, no changes to meds. Encouraged heart healthy diet such as the DASH diet and exercise as tolerated.  °

## 2020-11-09 NOTE — Patient Instructions (Addendum)
Molnupiravir/Paxlovid is the new COVID medication we can give you if you get COVID so make sure you test if you have symptoms because we have to treat by day 5 of symptoms for it to be effective. If you are positive let us know so we can treat. If a home test is negative and your symptoms are persistent get a PCR test. Can check testing locations at [email or web address] If you are positive we will make an appointment with [place] and we will send in molnupiravir/paxlovid if you would like it. Check with your pharmacy before we meet to confirm they have it in stock, if they do not then we can get the prescription at the [facility].    Recommend calcium intake of 1200 to 1500 mg daily, divided into roughly 3 doses. Best source is the diet and a single dairy serving is about 500 mg, a supplement of calcium citrate once or twice daily to balance diet is fine if not getting enough in diet. Also need Vitamin D 2000 IU caps, 1 cap daily if not already taking vitamin D. Also recommend weight baring exercise on hips and upper body to keep bones strong   Try Benefiber with a beverage to help with bowel trouble    Preventive Care 65 Years and Older, Female Preventive care refers to lifestyle choices and visits with your health care provider that can promote health and wellness. This includes: A yearly physical exam. This is also called an annual wellness visit. Regular dental and eye exams. Immunizations. Screening for certain conditions. Healthy lifestyle choices, such as: Eating a healthy diet. Getting regular exercise. Not using drugs or products that contain nicotine and tobacco. Limiting alcohol use. What can I expect for my preventive care visit? Physical exam Your health care provider will check your: Height and weight. These may be used to calculate your BMI (body mass index). BMI is a measurement that tells if you are at a healthy weight. Heart rate and blood pressure. Body  temperature. Skin for abnormal spots. Counseling Your health care provider may ask you questions about your: Past medical problems. Family's medical history. Alcohol, tobacco, and drug use. Emotional well-being. Home life and relationship well-being. Sexual activity. Diet, exercise, and sleep habits. History of falls. Memory and ability to understand (cognition). Work and work Statistician. Pregnancy and menstrual history. Access to firearms. What immunizations do I need? Vaccines are usually given at various ages, according to a schedule. Your health care provider will recommend vaccines for you based on your age, medical history, and lifestyle or other factors, such as travel or where you work. What tests do I need? Blood tests Lipid and cholesterol levels. These may be checked every 5 years, or more often depending on your overall health. Hepatitis C test. Hepatitis B test. Screening Lung cancer screening. You may have this screening every year starting at age 55 if you have a 30-pack-year history of smoking and currently smoke or have quit within the past 15 years. Colorectal cancer screening. All adults should have this screening starting at age 42 and continuing until age 72. Your health care provider may recommend screening at age 63 if you are at increased risk. You will have tests every 1-10 years, depending on your results and the type of screening test. Diabetes screening. This is done by checking your blood sugar (glucose) after you have not eaten for a while (fasting). You may have this done every 1-3 years. Mammogram. This may be done every  1-2 years. Talk with your health care provider about how often you should have regular mammograms. Abdominal aortic aneurysm (AAA) screening. You may need this if you are a current or former smoker. BRCA-related cancer screening. This may be done if you have a family history of breast, ovarian, tubal, or peritoneal cancers. Other  tests STD (sexually transmitted disease) testing, if you are at risk. Bone density scan. This is done to screen for osteoporosis. You may have this done starting at age 17. Talk with your health care provider about your test results, treatment options, and if necessary, the need for more tests. Follow these instructions at home: Eating and drinking  Eat a diet that includes fresh fruits and vegetables, whole grains, lean protein, and low-fat dairy products. Limit your intake of foods with high amounts of sugar, saturated fats, and salt. Take vitamin and mineral supplements as recommended by your health care provider. Do not drink alcohol if your health care provider tells you not to drink. If you drink alcohol: Limit how much you have to 0-1 drink a day. Be aware of how much alcohol is in your drink. In the [place] one drink equals one 12 oz bottle of beer (355 mL), one 5 oz glass of wine (148 mL), or one 1 oz glass of hard liquor (44 mL). Lifestyle Take daily care of your teeth and gums. Brush your teeth every morning and night with fluoride toothpaste. Floss one time each day. Stay active. Exercise for at least 30 minutes 5 or more days each week. Do not use any products that contain nicotine or tobacco, such as cigarettes, e-cigarettes, and chewing tobacco. If you need help quitting, ask your health care provider. Do not use drugs. If you are sexually active, practice safe sex. Use a condom or other form of protection in order to prevent STIs (sexually transmitted infections). Talk with your health care provider about taking a low-dose aspirin or statin. Find healthy ways to cope with stress, such as: Meditation, yoga, or listening to music. Journaling. Talking to a trusted person. Spending time with friends and family. Safety Always wear your seat belt while driving or riding in a vehicle. Do not drive: If you have been drinking alcohol. Do not ride with someone who has been  drinking. When you are tired or distracted. While texting. Wear a helmet and other protective equipment during sports activities. If you have firearms in your house, make sure you follow all gun safety procedures. What's next? Visit your health care provider once a year for an annual wellness visit. Ask your health care provider how often you should have your eyes and teeth checked. Stay up to date on all vaccines. This information is not intended to replace advice given to you by your health care provider. Make sure you discuss any questions you have with your health care provider. Document Revised: 03/19/2020 Document Reviewed: 01/03/2018 Elsevier Patient Education  2022 [employer].

## 2020-11-09 NOTE — Assessment & Plan Note (Signed)
Encouraged to get adequate exercise, calcium and vitamin d intake 

## 2020-11-09 NOTE — Progress Notes (Signed)
Patient ID: Suzanne Turner, female    DOB: 1945/04/06  Age: 75 y.o. MRN: [id number]    Subjective:   Chief Complaint  Patient presents with   Annual Exam   Subjective  HPI [name] presents for office visit today for comprehensive physical exam today. She is always worried about getting another UTI and takes the necessary precautions to minimize chances of getting it. She takes a baby aspirin by dinner time. Denies CP/palp/SOB/HA/congestion/fevers/GI or GU c/o. Taking meds as prescribed.  She is experiencing stress and depression due to financial hardship and life stresses. Her daughter had uterine fibroids that she had to be admitted to the ER for which added to her stress.   Review of Systems  Constitutional:  Negative for chills, fatigue and fever.  HENT:  Negative for congestion, rhinorrhea, sinus pressure, sinus pain, sore throat and trouble swallowing.   Eyes:  Negative for pain.  Respiratory:  Negative for cough and shortness of breath.   Cardiovascular:  Negative for chest pain, palpitations and leg swelling.  Gastrointestinal:  Negative for abdominal pain, blood in stool, diarrhea, nausea and vomiting.  Genitourinary:  Negative for decreased urine volume, flank pain, frequency, vaginal bleeding and vaginal discharge.  Musculoskeletal:  Negative for back pain.  Neurological:  Negative for headaches.   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    Osteoarthritis    Osteopenia    Preventative health care 01/30/2016   Salivary duct stone     Skin cancer    Wears glasses     She has a past surgical history that includes Tonsillectomy (1949); Tubal ligation (1977); Salivary stone removal (Right, 05/15/2012); ORIF tibia & fibula fractures (Right, 1966); Salivary stone removal (N/A, 11/18/2012); Skin biopsy (Right, 12/2012); and Refractive surgery (Left, March/April 2018).   Her family history includes Arrhythmia in her brother, maternal grandmother, maternal uncle, and mother; Arthritis in her father; Breast cancer in her sister; Cancer in her mother and sister; Colon cancer (age of onset: 59) in her mother; Colon polyps in her maternal aunt; Glaucoma in her father, maternal grandmother, and sister; Heart disease in her brother and maternal uncle; Heart disease (age of onset: 102) in her father; Hypertension in her daughter; Tremor in her brother and mother.She reports that she has been smoking cigarettes. She has a 20.00 pack-year smoking history. She has never used smokeless tobacco. She reports that she does not drink alcohol and does not use drugs.  Current Outpatient Medications on File Prior to Visit  Medication Sig Dispense Refill   acetaminophen (TYLENOL) 325 MG tablet Take 325 mg by mouth daily as needed for pain.     aspirin EC 81 MG tablet Take 81 mg by mouth daily.     carvedilol (COREG) 3.125 MG tablet TAKE 1 TABLET(3.125 MG) BY MOUTH TWICE DAILY WITH A MEAL 60 tablet 5   carvedilol (COREG) 3.125 MG tablet TAKE 1 TABLET(3.125 MG) BY MOUTH TWICE  DAILY WITH A MEAL 60 tablet 5   Multiple Vitamins-Minerals (HAIR/SKIN/NAILS/BIOTIN PO) Take 500 mg by mouth 3 (three) times daily.     potassium chloride SA (KLOR-CON) 20 MEQ tablet Take 20 mEq by mouth every Monday, Wednesday, and Friday.     triamcinolone (KENALOG) 0.1 % Apply 1 application topically daily as needed (affected areas).     VYZULTA 0.024 % SOLN Place 1 drop into both eyes at bedtime.     No current facility-administered medications on file prior to visit.      Objective:  Objective  Physical Exam Constitutional:      General: She is not in acute distress.    Appearance: Normal appearance. She is not ill-appearing or toxic-appearing.  HENT:     Head: Normocephalic and atraumatic.     Right Ear: Tympanic membrane, ear canal and external ear normal.     Left Ear: Tympanic membrane, ear canal and external ear normal.     Nose: No congestion or rhinorrhea.  Eyes:     Extraocular Movements: Extraocular movements intact.     Right eye: No nystagmus.     Left eye: No nystagmus.     Pupils: Pupils are equal, round, and reactive to light.  Cardiovascular:     Rate and Rhythm: Normal rate and regular rhythm.     Pulses: Normal pulses.     Heart sounds: Normal heart sounds. No murmur heard. Pulmonary:     Effort: Pulmonary effort is normal. No respiratory distress.     Breath sounds: Normal breath sounds. No wheezing, rhonchi or rales.  Abdominal:     General: Bowel sounds are normal.     Palpations: Abdomen is soft. There is no mass.     Tenderness: There is no abdominal tenderness. There is no guarding.     Hernia: No hernia is present.  Musculoskeletal:        General: Normal range of motion.     Cervical back: Normal range of motion and neck supple.  Skin:    General: Skin is warm and dry.  Neurological:     Mental Status: She is alert and oriented to person, place, and time.     Cranial Nerves: No facial asymmetry.     Motor: Motor function is intact. No weakness.     Deep Tendon Reflexes:     Reflex Scores:      Patellar reflexes are 1+ on the right side and 1+ on the left side. Psychiatric:        Behavior: Behavior normal.   BP 112/68   Pulse 72   Temp 97.9 F (36.6 C)   Resp 16   Ht 5\' 7"  (1.702 m)   Wt 127 lb 9.6 oz (57.9 kg)   SpO2 95%   BMI 19.98 kg/m  Wt Readings from Last 3 Encounters:  11/09/20 127 lb 9.6 oz (57.9 kg)  07/15/20 126 lb 12.8 oz (57.5 kg)  04/20/20 126 lb 3.2 oz (57.2 kg)     Lab Results   Component Value Date   WBC 4.4 11/09/2020   HGB 12.6 11/09/2020   HCT 37.5 11/09/2020   PLT 199.0 11/09/2020   GLUCOSE 78 11/09/2020   CHOL 186 11/09/2020   TRIG 100.0 11/09/2020   HDL 65.70 11/09/2020   LDLCALC 101 (H) 11/09/2020   ALT 14 11/09/2020   AST 22 11/09/2020   NA 136 11/09/2020   K 3.7 11/09/2020   CL 104 11/09/2020   CREATININE 0.74 11/09/2020  BUN 15 11/09/2020   CO2 26 11/09/2020   TSH 2.34 11/09/2020   INR 1.0 03/22/2020   HGBA1C 5.6 11/09/2020    MM 3D SCREEN BREAST BILATERAL  Result Date: 05/11/2020 CLINICAL DATA:  Screening. EXAM: DIGITAL SCREENING BILATERAL MAMMOGRAM WITH TOMOSYNTHESIS AND CAD TECHNIQUE: Bilateral screening digital craniocaudal and mediolateral oblique mammograms were obtained. Bilateral screening digital breast tomosynthesis was performed. The images were evaluated with computer-aided detection. COMPARISON:  Previous exam(s). ACR Breast Density Category b: There are scattered areas of fibroglandular density. FINDINGS: There are no findings suspicious for malignancy. The images were evaluated with computer-aided detection. IMPRESSION: No mammographic evidence of malignancy. A result letter of this screening mammogram will be mailed directly to the patient. RECOMMENDATION: Screening mammogram in one year. (Code:SM-B-01Y) BI-RADS CATEGORY  1: Negative. Electronically Signed   By: Dorise Bullion III M.D   On: 05/11/2020 14:55     Assessment & Plan:  Plan    No orders of the defined types were placed in this encounter.   Problem List Items Addressed This Visit     Vitamin D deficiency    Supplement and monitor      Relevant Orders   VITAMIN D 25 Hydroxy (Vit-D Deficiency, Fractures)   Essential hypertension    Well controlled, no changes to meds. Encouraged heart healthy diet such as the DASH diet and exercise as tolerated.       Relevant Orders   Hemoglobin A1c (Completed)   CBC with Differential/Platelet (Completed)    Comprehensive metabolic panel (Completed)   Lipid panel (Completed)   TSH (Completed)   Osteoporosis    Encouraged to get adequate exercise, calcium and vitamin d intake      Hyperlipidemia - Primary    Encourage heart healthy diet such as MIND or DASH diet, increase exercise, avoid trans fats, simple carbohydrates and processed foods, consider a krill or fish or flaxseed oil cap daily.       Relevant Orders   Hemoglobin A1c (Completed)   CBC with Differential/Platelet (Completed)   Comprehensive metabolic panel (Completed)   Lipid panel (Completed)   TSH (Completed)   Other iron deficiency anemias   Relevant Orders   CBC with Differential/Platelet (Completed)   Comprehensive metabolic panel (Completed)   Lipid panel (Completed)   TSH (Completed)   Counseling and coordination of care    Patient is struggling financially on a fixed income and is having trouble affording her medications and paying her bills. Will refer for CCM services to see if there is any assistance out there for the patient.       Relevant Orders   AMB Referral to [facility] Coordinaton   Preventative health care    Patient encouraged to maintain heart healthy diet, regular exercise, adequate sleep. Consider daily probiotics. Take medications as prescribed. Labs ordered and reviewed. MGM was in April 2022 repeat in 2023. Colonoscopy due in 4 years. Dexa scan last year      Relevant Orders   Hemoglobin A1c (Completed)   CBC with Differential/Platelet (Completed)   Comprehensive metabolic panel (Completed)   Lipid panel (Completed)   TSH (Completed)   History of UTI    She is anxious about becoming so ill again. She is sent home with a sterile urine cup so she can bring back a sample if symptoms recur      Other Visit Diagnoses     High risk medication use       Relevant Orders   Drug Monitoring Panel  [id number] , Urine   Urinary frequency       Relevant Orders   Urinalysis   Urine Culture        Follow-up: Return in about 6 months (around 05/10/2021) for f/u visit.  I, Suezanne Jacquet, acting as a scribe for Penni Homans, MD, have documented all relevent documentation on behalf of Penni Homans, MD, as directed by Penni Homans, MD while in the presence of Penni Homans, MD. DO:11/10/20.  I, Mosie Lukes, MD personally performed the services described in this documentation. All medical record entries made by the scribe were at my direction and in my presence. I have reviewed the chart and agree that the record reflects my personal performance and is accurate and complete

## 2020-11-10 ENCOUNTER — Telehealth: Payer: Self-pay | Admitting: *Deleted

## 2020-11-10 DIAGNOSIS — Z0000 Encounter for general adult medical examination without abnormal findings: Secondary | ICD-10-CM | POA: Insufficient documentation

## 2020-11-10 DIAGNOSIS — Z87440 Personal history of urinary (tract) infections: Secondary | ICD-10-CM | POA: Insufficient documentation

## 2020-11-10 NOTE — Assessment & Plan Note (Signed)
She is anxious about becoming so ill again. She is sent home with a sterile urine cup so she can bring back a sample if symptoms recur

## 2020-11-10 NOTE — Assessment & Plan Note (Signed)
Patient encouraged to maintain heart healthy diet, regular exercise, adequate sleep. Consider daily probiotics. Take medications as prescribed. Labs ordered and reviewed. MGM was in April 2022 repeat in 2023. Colonoscopy due in 4 years. Dexa scan last year

## 2020-11-10 NOTE — Chronic Care Management (AMB) (Signed)
  Chronic Care Management   Note  11/10/2020 Name: KHAI TORBERT MRN: [id number] DOB: Dec 01, 1945  MAIMUNA LEAMAN is a 75 y.o. year old female who is a primary care patient of Mosie Lukes, MD. I reached out to [name] by phone today in response to a referral sent by Ms. Lannette Donath Goldwater's PCP.  Ms. Darcey was given information about Chronic Care Management services today including:  CCM service includes personalized support from designated clinical staff supervised by her physician, including individualized plan of care and coordination with other care providers 24/7 contact phone numbers for assistance for urgent and routine care needs. Service will only be billed when office clinical staff spend 20 minutes or more in a month to coordinate care. Only one practitioner may furnish and bill the service in a calendar month. The patient may stop CCM services at any time (effective at the end of the month) by phone call to the office staff. The patient is responsible for co-pay (up to 20% after annual deductible is met) if co-pay is required by the individual health plan.   Patient agreed to services and verbal consent obtained.   Follow up plan: Telephone appointment with care management team member scheduled for: 11/17/2020  Julian Hy, [place] [facility]  Direct Dial: [phone]

## 2020-11-11 LAB — VITAMIN D 25 HYDROXY (VIT D DEFICIENCY, FRACTURES): VITD: 37.48 ng/mL (ref 30.00–100.00)

## 2020-11-15 DIAGNOSIS — H34831 Tributary (branch) retinal vein occlusion, right eye, with macular edema: Secondary | ICD-10-CM | POA: Diagnosis not present

## 2020-11-17 ENCOUNTER — Ambulatory Visit (INDEPENDENT_AMBULATORY_CARE_PROVIDER_SITE_OTHER): Payer: Medicare HMO | Admitting: Pharmacist

## 2020-11-17 DIAGNOSIS — M810 Age-related osteoporosis without current pathological fracture: Secondary | ICD-10-CM

## 2020-11-17 DIAGNOSIS — H4010X0 Unspecified open-angle glaucoma, stage unspecified: Secondary | ICD-10-CM

## 2020-11-17 DIAGNOSIS — E559 Vitamin D deficiency, unspecified: Secondary | ICD-10-CM

## 2020-11-17 DIAGNOSIS — I10 Essential (primary) hypertension: Secondary | ICD-10-CM

## 2020-11-18 NOTE — Chronic Care Management (AMB) (Signed)
Chronic Care Management Pharmacy Note  11/18/2020 Name:  Suzanne Turner MRN:  [id number] DOB:  August 08, 1945  Summary: Patient having difficulty with finances. Copay for Vyzulta high. Screened for LIS and PAP but patient did not qualify for either.  Requested tier exception form [employer] - decision pending Patient has been referred to community care for other financial assistance.  Will follow up tier exception decision and reassess 2023 cost.  Subjective: Suzanne Turner is an 75 y.o. year old female who is a primary patient of Mosie Lukes, MD.  The CCM team was consulted for assistance with disease management and care coordination needs.    Engaged with patient by telephone for initial visit in response to provider referral for pharmacy case management and/or care coordination services.   Consent to Services:  The patient was given the following information about Chronic Care Management services today, agreed to services, and gave verbal consent: 1. CCM service includes personalized support from designated clinical staff supervised by the primary care provider, including individualized plan of care and coordination with other care providers 2. 24/7 contact phone numbers for assistance for urgent and routine care needs. 3. Service will only be billed when office clinical staff spend 20 minutes or more in a month to coordinate care. 4. Only one practitioner may furnish and bill the service in a calendar month. 5.The patient may stop CCM services at any time (effective at the end of the month) by phone call to the office staff. 6. The patient will be responsible for cost sharing (co-pay) of up to 20% of the service fee (after annual deductible is met). Patient agreed to services and consent obtained.  Patient Care Team: Mosie Lukes, MD as PCP - General (Family Medicine) Larey Dresser, MD as Consulting Physician (Cardiology) Shon Hough, MD as Consulting Physician  (Ophthalmology) Sherrlyn Hock. Gordy Levan, DDS as Consulting Physician (Dentistry) Cherre Robins, PharmD (Pharmacist)  Recent office visits: 11/09/2020 - PCP (Dr Charlett Blake) Seen for comprehensive physical exam.   Recent consult visits: No recent consults  Hospital visits: None in previous 6 months  Objective:  Lab Results  Component Value Date   CREATININE 0.74 11/09/2020   CREATININE 0.81 04/20/2020   CREATININE 0.76 03/24/2020    Lab Results  Component Value Date   HGBA1C 5.6 11/09/2020   Last diabetic Eye exam: No results found for: HMDIABEYEEXA  Last diabetic Foot exam: No results found for: HMDIABFOOTEX      Component Value Date/Time   CHOL 186 11/09/2020 1123   TRIG 100.0 11/09/2020 1123   HDL 65.70 11/09/2020 1123   CHOLHDL 3 11/09/2020 1123   VLDL 20.0 11/09/2020 1123   LDLCALC 101 (H) 11/09/2020 1123   LDLCALC 126 (H) 10/20/2019 1006    Hepatic Function Latest Ref Rng & Units 11/09/2020 04/20/2020 03/24/2020  Total Protein 6.0 - 8.3 g/dL 7.3 7.5 7.0  Albumin 3.5 - 5.2 g/dL 4.3 4.4 3.9  AST 0 - 37 U/L 22 20 20   ALT 0 - 35 U/L 14 12 17   Alk Phosphatase 39 - 117 U/L 63 68 77  Total Bilirubin 0.2 - 1.2 mg/dL 0.6 0.6 0.5  Bilirubin, Direct 0.0 - 0.2 mg/dL - - -    Lab Results  Component Value Date/Time   TSH 2.34 11/09/2020 11:23 AM   TSH 2.76 04/20/2020 10:40 AM   FREET4 0.85 01/28/2016 12:01 PM    CBC Latest Ref Rng & Units 11/09/2020 04/20/2020 03/22/2020  WBC 4.0 - 10.5 K/uL  4.4 6.2 7.0  Hemoglobin 12.0 - 15.0 g/dL 12.6 13.1 12.0  Hematocrit 36.0 - 46.0 % 37.5 38.2 34.2(L)  Platelets 150.0 - 400.0 K/uL 199.0 179.0 245    Lab Results  Component Value Date/Time   VD25OH 37.48 11/09/2020 03:56 PM   VD25OH 38.87 04/20/2020 10:40 AM    Clinical ASCVD: No  The 10-year ASCVD risk score (Arnett DK, et al., 2019) is: 23.2%   Values used to calculate the score:     Age: 75 years     Sex: Female     Is Non-Hispanic African American: No     Diabetic: No      Tobacco smoker: Yes     Systolic Blood Pressure: 387 mmHg     Is BP treated: Yes     HDL Cholesterol: 65.7 mg/dL     Total Cholesterol: 186 mg/dL      Social History   Tobacco Use  Smoking Status Every Day   Packs/day: 0.50   Years: 40.00   Pack years: 20.00   Types: Cigarettes  Smokeless Tobacco Never   BP Readings from Last 3 Encounters:  11/09/20 112/68  07/15/20 (!) 144/88  04/20/20 126/72   Pulse Readings from Last 3 Encounters:  11/09/20 72  07/15/20 76  04/20/20 78   Wt Readings from Last 3 Encounters:  11/09/20 127 lb 9.6 oz (57.9 kg)  07/15/20 126 lb 12.8 oz (57.5 kg)  04/20/20 126 lb 3.2 oz (57.2 kg)    Assessment: Review of patient past medical history, allergies, medications, health status, including review of consultants reports, laboratory and other test data, was performed as part of comprehensive evaluation and provision of chronic care management services.   SDOH:  (Social Determinants of Health) assessments and interventions performed:  SDOH Interventions    Flowsheet Row Most Recent Value  SDOH Interventions   Financial Strain Interventions Other (Comment)  [asked [employer] Medicare for tier exception to lower copay - decision pending]       CCM Care Plan  Allergies  Allergen Reactions   Medrol [Methylprednisolone] Swelling and Other (See Comments)    Facial and lip swelling, but no breathing issues   Rhopressa [Netarsudil Dimesylate] Itching   Amlodipine Other (See Comments)    Hip,legs,and feet feels achy and stiff   Lisinopril Rash   Losartan Anxiety   Penicillins Rash   Sulfonamide Derivatives Rash   Timolol Rash    Medications Reviewed Today     Reviewed by Cherre Robins, PharmD (Pharmacist) on 11/18/20 at [place] List Status: <None>   Medication Order Taking? Sig Documenting Provider Last Dose Status Informant  acetaminophen (TYLENOL) 325 MG tablet [id number] Yes Take 325 mg by mouth daily as needed for pain. [provider] Taking Active Self  aspirin EC 81 MG tablet [id number] Yes Take 81 mg by mouth daily. [provider] Taking Active Self  carvedilol (COREG) 3.125 MG tablet [id number] Yes TAKE 1 TABLET(3.125 MG) BY MOUTH TWICE DAILY WITH A MEAL Mosie Lukes, MD Taking Active   carvedilol (COREG) 3.125 MG tablet [id number] Yes TAKE 1 TABLET(3.125 MG) BY MOUTH TWICE DAILY WITH A MEAL Mosie Lukes, MD Taking Active   Multiple Vitamins-Minerals (HAIR/SKIN/NAILS/BIOTIN PO) [id number] Yes Take 500 mg by mouth 3 (three) times daily. [provider] Taking Active Self  potassium chloride SA (KLOR-CON) 20 MEQ tablet [id number] Yes Take 20 mEq by mouth every Monday, Wednesday, and Friday. [provider] Taking Active   triamcinolone (KENALOG) 0.1 %  [id number] Yes Apply 1 application topically daily as needed (affected areas). [provider] Taking Active Self  VYZULTA 0.024 % SOLN [id number] Yes Place 1 drop into both eyes at bedtime. [provider] Taking Active Self            Patient Active Problem List   Diagnosis Date Noted   Preventative health care 11/10/2020   History of UTI 11/10/2020   Retinal vein occlusion of right eye 04/20/2020   Arthritis 10/20/2019   Abdominal pain, RLQ 03/03/2018   SCC (squamous cell carcinoma), leg, left 09/03/2017   Hyponatremia 03/05/2017   Left hip pain 01/29/2017   Counseling and coordination of care 01/30/2016   Abnormal thyroid blood test 01/28/2016   Other iron deficiency anemias 08/02/2014   Hypercalcemia 08/02/2014   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   History of chicken pox    Rash and nonspecific skin eruption 07/23/2013   At risk for coronary artery disease 03/05/2013   Colon polyps 04/17/2011   Tremor 04/17/2011   Hyperlipidemia 03/29/2011   Palpitations 02/07/2011   Smoking 02/07/2011   CONSTIPATION 05/11/2008   Vitamin D deficiency 05/07/2008   Essential hypertension 05/07/2008   Osteoporosis  05/07/2008    Immunization History  Administered Date(s) Administered   Fluad Quad(high Dose 65+) 10/20/2019   Influenza Split 10/23/2010   Influenza, High Dose Seasonal PF 11/13/2016, 11/13/2017   Influenza,inj,Quad PF,6+ Mos 12/11/2012   Influenza-Unspecified 11/05/2013, 11/16/2014, 10/08/2020   Moderna Covid-19 Vaccine Bivalent Booster 10yr & up 10/08/2020   Moderna SARS-COV2 Booster Vaccination 10/08/2020   PFIZER(Purple Top)SARS-COV-2 Vaccination 03/01/2019, 03/26/2019, 11/22/2019, 05/21/2020   Pneumococcal Conjugate-13 06/23/2010, 05/11/2014   Tdap 04/01/2014   Zoster Recombinat (Shingrix) 10/12/2017, 12/14/2017   Zoster, Live 07/24/2006    Conditions to be addressed/monitored: HTN, Osteoporosis, and glaucoma; low vitamin D  Care Plan : General Pharmacy (Adult)  Updates made by ECherre Robins PHARMD since 11/18/2020 12:00 AM     Problem: HTN; glaucoma; osteoporosis; low vitamin D   Priority: Medium  Onset Date: 11/17/2020     Long-Range Goal: Provide education, support and care coordination for medication therapy and chronic conditions   Start Date: 11/17/2020  Expected End Date: 08/18/2021  Priority: Medium  Note:   Current Barriers:  Unable to independently afford treatment regimen  Pharmacist Clinical Goal(s):  Over the next 90 days, patient will verbalize ability to afford treatment regimen maintain control of HTN as evidenced by blood pressure <140/90  through collaboration with PharmD and provider.   Interventions: 1:1 collaboration with BMosie Lukes MD regarding development and update of comprehensive plan of care as evidenced by provider attestation and co-signature Inter-disciplinary care team collaboration (see longitudinal plan of care) Comprehensive medication review performed; medication list updated in electronic medical record  Glaucoma:  Controlled Managed by Dr LPrudencio BurlyCurrent therapy:  Vyzulta 0.024% eye drops - use 1 drop in each eye at  bedtime Previous medications tried: latanoprost (ineffective), timolol (caused rash), Rhopressa (eye itching), Lumigan (in effective)  Patient report 90 days cost for Vyzulta is $285 Interventions:  Formulary review performed - Vyzulta is tier 4 which means for 2022 30 day copay is $95 and 90 day copay is $285 Reviewed for LIS - does not qualify Reviewed for PAP but patient does not qualify for Vyzulta PAP [employer] [place] exception to see if can get Vyzulta lowered to tier 2 or 3 which means copay for 90 days would be between $30 and $141 which patient states would help. Awaiting decision -  will be at least 72 hours. Decision will be faxed to PCP office.   Hypertension: Controlled; Current treatment:  Carvedilol 6.60m twice a day Current home readings: none reported Denies hypotensive/hypertensive symptoms Interventions:   Reviewed blood pressure goals Continue current therapy for blood pressure  Osteoporosis / history of low serum vitamin D: Current treatment:None - patient declined pharmacotherapy after last DEXA  Last DEXA completed 05/28/2019: Femur Neck Left  T-score of -2.9. Lumbar spine was not utilized due to advanced degenerative changes.  Left Forearm Radius 33% T-Score of -2.3  Supplementation:  Calcium 1200 to 15026mdaily from diet and/or supplements Vitamin D 2000 IU daily Last serum vitamin D was normal at 37.48 (checked 11/09/2020) Interventions:  Continue current supplementation Continue weight bearing exercise Consider recheck bone density in 2023 (after 05/27/2021)  Medication management Pharmacist Clinical Goal(s): Over the next 90 days, patient will work with PharmD and providers to maintain optimal medication adherence Interventions Comprehensive medication review performed. Reviewed refill history and assessed adherence Continue current medication management strategy Recommended patient discussed continued need for aspirin 8125mith  ophthalmologist Dr LylPrudencio Burlyas history of retinal vein occlusion and he might recommend continuing, otherwise she can stop)   Patient Goals/Self-Care Activities Over the next 90 days, patient will:  take medications as prescribed,  collaborate with provider on medication access solutions Continue to exercise with a target of at least 150 minutes of moderate intensity exercise weekly  Follow Up Plan: Telephone follow up appointment with care management team member scheduled for:  January 2023 to assess formulary and discuss strategies for lowering medication costs for 2023.       Medication Assistance:  screened for LIS and PAP for Vyzulta - patient is not eligible for either. Vyzulta - has to been in Medicare coverage gap and patient in not in gap. I did request tier exception form [employer] - decision pending.   [facility]  [facility] [place]one: [phone]: [phone] [address] [facility] [address]one: [phone]: [phone] Up:  Patient agrees to Care Plan and Follow-up.  Plan: Telephone follow up appointment with care management team member scheduled for:  January 2023  TamCherre RobinsharmD Clinical Pharmacist [facility]

## 2020-11-18 NOTE — Patient Instructions (Signed)
Visit Information  PATIENT GOALS:  Goals Addressed             This [place] for Chronic Care Management       Glaucoma:  Controlled Managed by Dr Prudencio Burly Current therapy:  Vyzulta 0.024% eye drops - use 1 drop in each eye at bedtime Interventions:  Formulary review performed - Vyzulta is tier 4 which means for 2022 30 day copay is $95 and 90 day copay is $285 I contacted [employer] for tier exception to see if can get Vyzulta lowered to tier 2 or 3 which means copay for 90 days would be between $30 and $141 Awaiting decision - will be at least 72 hours. Decision will be faxed to PCP office.  We will call you when we hear about decision.  Hypertension: Blood pressure goal <140/90 Current treatment:  Carvedilol 6.25mg  twice a day Interventions:   Reviewed blood pressure goal Continue current therapy for blood pressure  Osteoporosis / history of low serum vitamin D: Supplementation:  Calcium 1200 to 1500mg  daily from diet and/or supplements Vitamin D 2000 IU daily Last serum vitamin D was normal at 37.48 (checked 11/09/2020) Interventions:  Continue current supplementation Continue weight bearing exercise Consider recheck bone density in 2023 (after 05/27/2021)  Patient Goals/Self-Care Activities Over the next 90 days, patient will:  take medications as prescribed,  collaborate with provider on medication access solutions Continue to exercise with a target of at least 150 minutes of moderate intensity exercise weekly  Follow Up Plan: Telephone follow up appointment with care management team member scheduled for:  January 2023 to assess formulary and discuss strategies for lowering medication costs for 2023.         Patient verbalizes understanding of instructions provided today and agrees to view in [place].   Telephone follow up appointment with care management team member scheduled for: January 2023  Cherre Robins, PharmD Clinical  Pharmacist [facility]

## 2020-11-22 DIAGNOSIS — M810 Age-related osteoporosis without current pathological fracture: Secondary | ICD-10-CM | POA: Diagnosis not present

## 2020-11-22 DIAGNOSIS — H4010X0 Unspecified open-angle glaucoma, stage unspecified: Secondary | ICD-10-CM

## 2020-11-22 DIAGNOSIS — I10 Essential (primary) hypertension: Secondary | ICD-10-CM | POA: Diagnosis not present

## 2020-11-23 ENCOUNTER — Ambulatory Visit (INDEPENDENT_AMBULATORY_CARE_PROVIDER_SITE_OTHER): Payer: Medicare HMO | Admitting: Pharmacist

## 2020-11-23 DIAGNOSIS — I10 Essential (primary) hypertension: Secondary | ICD-10-CM

## 2020-11-23 DIAGNOSIS — H4010X0 Unspecified open-angle glaucoma, stage unspecified: Secondary | ICD-10-CM

## 2020-11-23 NOTE — Chronic Care Management (AMB) (Signed)
Chronic Care Management Pharmacy Note  11/23/2020 Name:  Suzanne Turner MRN:  [id number] DOB:  [birth year]  Summary: Patient having difficulty with finances. Copay for Vyzulta high. Screened for LIS and PAP at lats visit but patient did not qualify. Requested tier exception from [facility] - initial request was denied and [employer] recommended trial of Alphagan / brimonidine or Combigan. She cannot take Combigan due to containing timolol - allergy and it looks like she might have try brimonidine but she is not sure why stopped.  Contacted ophthalmology for more information on past therapies and will appear tier exception.  Patient has been referred to community care for other financial assistance.   Subjective: Suzanne Turner is an 74 y.o. year old female who is a primary patient of Mosie Lukes, MD.  The CCM team was consulted for assistance with disease management and care coordination needs.    Engaged with patient by telephone for initial visit in response to provider referral for pharmacy case management and/or care coordination services.   Consent to Services:  The patient was given the following information about Chronic Care Management services today, agreed to services, and gave verbal consent: 1. CCM service includes personalized support from designated clinical staff supervised by the primary care provider, including individualized plan of care and coordination with other care providers 2. 24/7 contact phone numbers for assistance for urgent and routine care needs. 3. Service will only be billed when office clinical staff spend 20 minutes or more in a month to coordinate care. 4. Only one practitioner may furnish and bill the service in a calendar month. 5.The patient may stop CCM services at any time (effective at the end of the month) by phone call to the office staff. 6. The patient will be responsible for cost sharing (co-pay) of up to 20% of the service fee (after annual deductible  is met). Patient agreed to services and consent obtained.  Patient Care Team: Mosie Lukes, MD as PCP - General (Family Medicine) Larey Dresser, MD as Consulting Physician (Cardiology) Shon Hough, MD as Consulting Physician (Ophthalmology) Sherrlyn Hock. Gordy Levan, DDS as Consulting Physician (Dentistry) Cherre Robins, RPH-CPP (Pharmacist)  Recent office visits: 11/09/2020 - PCP (Dr Charlett Blake) Seen for comprehensive physical exam.   Recent consult visits: No recent consults  Hospital visits: None in previous 6 months  Objective:  Lab Results  Component Value Date   CREATININE 0.74 11/09/2020   CREATININE 0.81 04/20/2020   CREATININE 0.76 03/24/2020    Lab Results  Component Value Date   HGBA1C 5.6 11/09/2020   Last diabetic Eye exam: No results found for: HMDIABEYEEXA  Last diabetic Foot exam: No results found for: HMDIABFOOTEX      Component Value Date/Time   CHOL 186 11/09/2020 1123   TRIG 100.0 11/09/2020 1123   HDL 65.70 11/09/2020 1123   CHOLHDL 3 11/09/2020 1123   VLDL 20.0 11/09/2020 1123   LDLCALC 101 (H) 11/09/2020 1123   LDLCALC 126 (H) 10/20/2019 1006    Hepatic Function Latest Ref Rng & Units 11/09/2020 04/20/2020 03/24/2020  Total Protein 6.0 - 8.3 g/dL 7.3 7.5 7.0  Albumin 3.5 - 5.2 g/dL 4.3 4.4 3.9  AST 0 - 37 U/L _0 ALT 0 - 35 U/L _1 Alk Phosphatase 39 - 117 U/L 63 68 77  Total Bilirubin 0.2 - 1.2 mg/dL 0.6 0.6 0.5  Bilirubin, Direct 0.0 - 0.2 mg/dL - - -    Lab Results  Component Value Date/Time   TSH 2.34 11/09/2020 11:23 AM   TSH 2.76 04/20/2020 10:40 AM   FREET4 0.85 01/28/2016 12:01 PM    CBC Latest Ref Rng & Units 11/09/2020 04/20/2020 03/22/2020  WBC 4.0 - 10.5 K/uL 4.4 6.2 7.0  Hemoglobin 12.0 - 15.0 g/dL 12.6 13.1 12.0  Hematocrit 36.0 - 46.0 % 37.5 38.2 34.2(L)  Platelets 150.0 - 400.0 K/uL 199.0 179.0 245    Lab Results  Component Value Date/Time   VD25OH 37.48 11/09/2020 03:56 PM   VD25OH 38.87 04/20/2020  10:40 AM    Clinical ASCVD: No  The 10-year ASCVD risk score (Arnett DK, et al., 2019) is: 23.2%   Values used to calculate the score:     Age: 89 years     Sex: Female     Is Non-Hispanic African American: No     Diabetic: No     Tobacco smoker: Yes     Systolic Blood Pressure: 599 mmHg     Is BP treated: Yes     HDL Cholesterol: 65.7 mg/dL     Total Cholesterol: 186 mg/dL      Social History   Tobacco Use  Smoking Status Every Day   Packs/day: 0.50   Years: 40.00   Pack years: 20.00   Types: Cigarettes  Smokeless Tobacco Never   BP Readings from Last 3 Encounters:  11/09/20 112/68  07/15/20 (!) 144/88  04/20/20 126/72   Pulse Readings from Last 3 Encounters:  11/09/20 72  07/15/20 76  04/20/20 78   Wt Readings from Last 3 Encounters:  11/09/20 127 lb 9.6 oz (57.9 kg)  07/15/20 126 lb 12.8 oz (57.5 kg)  04/20/20 126 lb 3.2 oz (57.2 kg)    Assessment: Review of patient past medical history, allergies, medications, health status, including review of consultants reports, laboratory and other test data, was performed as part of comprehensive evaluation and provision of chronic care management services.   SDOH:  (Social Determinants of Health) assessments and interventions performed:     CCM Care Plan  Allergies  Allergen Reactions   Medrol [Methylprednisolone] Swelling and Other (See Comments)    Facial and lip swelling, but no breathing issues   Rhopressa [Netarsudil Dimesylate] Itching   Amlodipine Other (See Comments)    Hip,legs,and feet feels achy and stiff   Lisinopril Rash   Losartan Anxiety   Penicillins Rash   Sulfonamide Derivatives Rash   Timolol Rash    Medications Reviewed Today     Reviewed by Cherre Robins, PharmD (Pharmacist) on 11/18/20 at [phone]  Med List Status: <None>   Medication Order Taking? Sig Documenting Provider Last Dose Status Informant  acetaminophen (TYLENOL) 325 MG tablet [id number] Yes Take 325 mg by mouth daily as  needed for pain. [provider] Taking Active Self  aspirin EC 81 MG tablet [id number] Yes Take 81 mg by mouth daily. [provider] Taking Active Self  carvedilol (COREG) 3.125 MG tablet [id number] Yes TAKE 1 TABLET(3.125 MG) BY MOUTH TWICE DAILY WITH A MEAL Mosie Lukes, MD Taking Active   carvedilol (COREG) 3.125 MG tablet [id number] Yes TAKE 1 TABLET(3.125 MG) BY MOUTH TWICE DAILY WITH A MEAL Mosie Lukes, MD Taking Active   Multiple Vitamins-Minerals (HAIR/SKIN/NAILS/BIOTIN PO) [id number] Yes Take 500 mg by mouth 3 (three) times daily. [provider] Taking Active Self  potassium chloride SA (KLOR-CON) 20 MEQ tablet [id number] Yes Take 20 mEq by mouth every Monday, Wednesday, and Friday. [provider] Taking Active  triamcinolone (KENALOG) 0.1 % [id number] Yes Apply 1 application topically daily as needed (affected areas). [provider] Taking Active Self  VYZULTA 0.024 % SOLN [id number] Yes Place 1 drop into both eyes at bedtime. [provider] Taking Active Self            Patient Active Problem List   Diagnosis Date Noted   Preventative health care 11/10/2020   History of UTI 11/10/2020   Retinal vein occlusion of right eye 04/20/2020   Arthritis 10/20/2019   Abdominal pain, RLQ 03/03/2018   SCC (squamous cell carcinoma), leg, left 09/03/2017   Hyponatremia 03/05/2017   Left hip pain 01/29/2017   Counseling and coordination of care 01/30/2016   Abnormal thyroid blood test 01/28/2016   Other iron deficiency anemias 08/02/2014   Hypercalcemia 08/02/2014   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   History of chicken pox    Rash and nonspecific skin eruption 07/23/2013   At risk for coronary artery disease 03/05/2013   Colon polyps 04/17/2011   Tremor 04/17/2011   Hyperlipidemia 03/29/2011   Palpitations 02/07/2011   Smoking 02/07/2011   CONSTIPATION 05/11/2008   Vitamin D deficiency 05/07/2008   Essential hypertension  05/07/2008   Osteoporosis 05/07/2008    Immunization History  Administered Date(s) Administered   Fluad Quad(high Dose 65+) 10/20/2019   Influenza Split 10/23/2010   Influenza, High Dose Seasonal PF 11/13/2016, 11/13/2017   Influenza,inj,Quad PF,6+ Mos 12/11/2012   Influenza-Unspecified 11/05/2013, 11/16/2014, 10/08/2020   Moderna Covid-19 Vaccine Bivalent Booster 30yr & up 10/08/2020   Moderna SARS-COV2 Booster Vaccination 10/08/2020   PFIZER(Purple Top)SARS-COV-2 Vaccination 03/01/2019, 03/26/2019, 11/22/2019, 05/21/2020   Pneumococcal Conjugate-13 06/23/2010, 05/11/2014   Tdap 04/01/2014   Zoster Recombinat (Shingrix) 10/12/2017, 12/14/2017   Zoster, Live 07/24/2006    Conditions to be addressed/monitored: HTN, Osteoporosis, and glaucoma; low vitamin D  Care Plan : General Pharmacy (Adult)  Updates made by ECherre Robins RPH-CPP since 11/23/2020 12:00 AM     Problem: HTN; glaucoma; osteoporosis; low vitamin D   Priority: Medium  Onset Date: 11/17/2020     Long-Range Goal: Provide education, support and care coordination for medication therapy and chronic conditions   Start Date: 11/17/2020  Expected End Date: 08/18/2021  Priority: Medium  Note:   Current Barriers:  Unable to independently afford treatment regimen  Pharmacist Clinical Goal(s):  Over the next 90 days, patient will verbalize ability to afford treatment regimen maintain control of HTN as evidenced by blood pressure <140/90  through collaboration with PharmD and provider.   Interventions: 1:1 collaboration with BMosie Lukes MD regarding development and update of comprehensive plan of care as evidenced by provider attestation and co-signature Inter-disciplinary care team collaboration (see longitudinal plan of care) Comprehensive medication review performed; medication list updated in electronic medical record  Glaucoma:  Controlled Managed by Dr LPrudencio BurlyCurrent therapy:  Vyzulta 0.024% eye drops -  use 1 drop in each eye at bedtime Previous medications tried: latanoprost (ineffective), timolol (caused rash), Rhopressa (eye itching), Lumigan (in effective)  Patient report 90 days cost for Vyzulta is $285 At [place]17/2020 [place] if can get Vyzulta lowered to tier 2 or 3 which means copay for 90 days would be between $30 and $141 which patient states would help. Interventions:  Formulary review performed - Vyzulta is tier 4 which means for 2022; 30 day copay is $95 and 90 day copay is $285 Reviewed for LIS - does not qualify Reviewed for PAP but patient  does not qualify for Vyzulta PAP Contacted [employer] to follow up tier exception for Vyzulta. Was denied. [employer] recommended trial of either Combigan (contains timolol and patient has allergy) or Alphagan - brimonidine. After searching patient has taken Alphagan in past but unsure why stopped. Patient thinks it was due to poor effect but not sure.  Contacted Dr Prudencio Burly office to get more information about what she had taken / has allergy to in the past. Had to LM on VM - awaiting call back. Once I have more information will plan appeal initial decision.  Patient notified about above.   Hypertension: Controlled; Current treatment:  Carvedilol 6.52m twice a day Current home readings: none reported Denies hypotensive/hypertensive symptoms Interventions:   Reviewed blood pressure goals Continue current therapy for blood pressure  Osteoporosis / history of low serum vitamin D: Current treatment:None - patient declined pharmacotherapy after last DEXA  Last DEXA completed 05/28/2019: Femur Neck Left  T-score of -2.9. Lumbar spine was not utilized due to advanced degenerative changes.  Left Forearm Radius 33% T-Score of -2.3  Supplementation:  Calcium 1200 to 15055mdaily from diet and/or supplements Vitamin D 2000 IU daily Last serum vitamin D was normal at 37.48 (checked 11/09/2020) Interventions:   Continue current supplementation Continue weight bearing exercise Consider recheck bone density in 2023 (after 05/27/2021)  Medication management Pharmacist Clinical Goal(s): Over the next 90 days, patient will work with PharmD and providers to maintain optimal medication adherence Interventions Comprehensive medication review performed. Reviewed refill history and assessed adherence Continue current medication management strategy Recommended patient discussed continued need for aspirin 8133mith ophthalmologist Dr LylPrudencio Burlyas history of retinal vein occlusion and he might recommend continuing, otherwise she can stop)   Patient Goals/Self-Care Activities Over the next 90 days, patient will:  take medications as prescribed,  collaborate with provider on medication access solutions Continue to exercise with a target of at least 150 minutes of moderate intensity exercise weekly  Follow Up Plan: Telephone follow up appointment with care management team member scheduled for:  January 2023 to assess formulary and discuss strategies for lowering medication costs for 2023.       Medication Assistance:  screened for LIS and PAP for Vyzulta - patient is not eligible for either. Vyzulta - has to been in Medicare coverage gap and patient in not in gap. I did request tier exception form [employer] - decision pending.   Patient's preferred pharmacy [facility]:  [facility] [place] [place]one: [phone]: [phone] [facility]CP[facility] [address] Holiday[address]one: [phone]: [phone] Up:  Patient agrees to Care Plan and Follow-up.  Plan: Telephone follow up appointment with care management team member scheduled for:  January 2023  TamCherre RobinsharmD Clinical  Pharmacist [facility]

## 2020-11-23 NOTE — Patient Instructions (Addendum)
  Visit Information  We are appealing the copay exception decision and will let you know if the next 1 to 2 weeks if was approved.   The patient verbalized understanding of instructions, educational materials, and care plan provided today and declined offer to receive copy of patient instructions, educational materials, and care plan.   Telephone follow up appointment with care management team member scheduled for: 2 to 3 weeks to check tier exception; 2023 to reviewed medication costs for coming year,   Cherre Robins, PharmD Clinical Pharmacist [facility] SW [facility]

## 2020-11-26 ENCOUNTER — Encounter: Payer: Self-pay | Admitting: Family Medicine

## 2020-11-26 ENCOUNTER — Other Ambulatory Visit: Payer: Self-pay | Admitting: Family Medicine

## 2020-11-26 MED ORDER — FLUOXETINE HCL 20 MG PO TABS: ORAL_TABLET | ORAL | 3 refills | Status: DC

## 2020-11-26 NOTE — Telephone Encounter (Signed)
Pt aware and scheduled.

## 2020-12-02 ENCOUNTER — Other Ambulatory Visit: Payer: Self-pay | Admitting: Family Medicine

## 2020-12-02 MED ORDER — FLUOXETINE HCL 20 MG PO CAPS: 20.0000 mg | ORAL_CAPSULE | Freq: Every day | ORAL | 3 refills | Status: DC

## 2020-12-07 DIAGNOSIS — L821 Other seborrheic keratosis: Secondary | ICD-10-CM | POA: Diagnosis not present

## 2020-12-07 DIAGNOSIS — K130 Diseases of lips: Secondary | ICD-10-CM | POA: Diagnosis not present

## 2020-12-07 DIAGNOSIS — Z85828 Personal history of other malignant neoplasm of skin: Secondary | ICD-10-CM | POA: Diagnosis not present

## 2020-12-07 DIAGNOSIS — L80 Vitiligo: Secondary | ICD-10-CM | POA: Diagnosis not present

## 2020-12-07 DIAGNOSIS — L300 Nummular dermatitis: Secondary | ICD-10-CM | POA: Diagnosis not present

## 2020-12-07 DIAGNOSIS — L814 Other melanin hyperpigmentation: Secondary | ICD-10-CM | POA: Diagnosis not present

## 2020-12-22 DIAGNOSIS — I10 Essential (primary) hypertension: Secondary | ICD-10-CM | POA: Diagnosis not present

## 2020-12-22 DIAGNOSIS — F17210 Nicotine dependence, cigarettes, uncomplicated: Secondary | ICD-10-CM | POA: Diagnosis not present

## 2020-12-22 DIAGNOSIS — H4010X0 Unspecified open-angle glaucoma, stage unspecified: Secondary | ICD-10-CM | POA: Diagnosis not present

## 2020-12-29 DIAGNOSIS — H34831 Tributary (branch) retinal vein occlusion, right eye, with macular edema: Secondary | ICD-10-CM | POA: Diagnosis not present

## 2021-01-04 ENCOUNTER — Encounter: Payer: Self-pay | Admitting: Family Medicine

## 2021-01-05 ENCOUNTER — Other Ambulatory Visit: Payer: Self-pay

## 2021-01-05 MED ORDER — SERTRALINE HCL 50 MG PO TABS: ORAL_TABLET | ORAL | 3 refills | Status: DC

## 2021-01-05 NOTE — Telephone Encounter (Signed)
Pt aware.

## 2021-01-27 ENCOUNTER — Encounter: Payer: Self-pay | Admitting: Family Medicine

## 2021-02-01 ENCOUNTER — Ambulatory Visit (INDEPENDENT_AMBULATORY_CARE_PROVIDER_SITE_OTHER): Payer: Medicare HMO | Admitting: Pharmacist

## 2021-02-01 DIAGNOSIS — I10 Essential (primary) hypertension: Secondary | ICD-10-CM

## 2021-02-01 DIAGNOSIS — M810 Age-related osteoporosis without current pathological fracture: Secondary | ICD-10-CM

## 2021-02-01 DIAGNOSIS — H4010X0 Unspecified open-angle glaucoma, stage unspecified: Secondary | ICD-10-CM

## 2021-02-01 DIAGNOSIS — E559 Vitamin D deficiency, unspecified: Secondary | ICD-10-CM

## 2021-02-01 NOTE — Chronic Care Management (AMB) (Signed)
Chronic Care Management Pharmacy Note  02/01/2021 Name:  Suzanne Turner MRN:  [id number] DOB:  January 05, 1946  Summary: Patient having difficulty with finances. Copay for Vyzulta high.  Requested tier exception from [facility] again with additional information on past therapies from Dr Prudencio Burly office. .  Also discussed therapies for depression - patient declines pharmacotherapy for depression at this time. .   Subjective: Suzanne Turner is an 76 y.o. year old female who is a primary patient of Mosie Lukes, MD.  The CCM team was consulted for assistance with disease management and care coordination needs.    Engaged with patient by telephone for follow up visit in response to provider referral for pharmacy case management and/or care coordination services.   Consent to Services:  The patient was given information about Chronic Care Management services, agreed to services, and gave verbal consent prior to initiation of services.  Please see initial visit note for detailed documentation.   Patient Care Team: Mosie Lukes, MD as PCP - General (Family Medicine) Larey Dresser, MD as Consulting Physician (Cardiology) Shon Hough, MD as Consulting Physician (Ophthalmology) Sherrlyn Hock. Gordy Levan, DDS as Consulting Physician (Dentistry) Cherre Robins, RPH-CPP (Pharmacist)  Recent office visits: 01/28/2020 - PCP (Dr Charlett Blake) [place] - patient notified PCP that after reading potential side effects of sertraline she decided not to take it.  01/04/2021 - PCP (Dr Charlett Blake) [place] - not tolerating fluoxetine. Causing loss of appetite and tiredness. Changed to sertraline 55m - take 250mfor 2 weeks then 5054maily 11/09/2020 - PCP (Dr BlyCharlett Blakeeen for comprehensive physical exam.   Recent consult visits: No recent consults  Hospital visits: None in previous 6 months  Objective:  Lab Results  Component Value Date   CREATININE 0.74 11/09/2020   CREATININE 0.81 04/20/2020    CREATININE 0.76 03/24/2020    Lab Results  Component Value Date   HGBA1C 5.6 11/09/2020   Last diabetic Eye exam: No results found for: HMDIABEYEEXA  Last diabetic Foot exam: No results found for: HMDIABFOOTEX      Component Value Date/Time   CHOL 186 11/09/2020 1123   TRIG 100.0 11/09/2020 1123   HDL 65.70 11/09/2020 1123   CHOLHDL 3 11/09/2020 1123   VLDL 20.0 11/09/2020 1123   LDLCALC 101 (H) 11/09/2020 1123   LDLCALC 126 (H) 10/20/2019 1006    Hepatic Function Latest Ref Rng & Units 11/09/2020 04/20/2020 03/24/2020  Total Protein 6.0 - 8.3 g/dL 7.3 7.5 7.0  Albumin 3.5 - 5.2 g/dL 4.3 4.4 3.9  AST 0 - 37 U/L _0 ALT 0 - 35 U/L _1 Alk Phosphatase 39 - 117 U/L 63 68 77  Total Bilirubin 0.2 - 1.2 mg/dL 0.6 0.6 0.5  Bilirubin, Direct 0.0 - 0.2 mg/dL - - -    Lab Results  Component Value Date/Time   TSH 2.34 11/09/2020 11:23 AM   TSH 2.76 04/20/2020 10:40 AM   FREET4 0.85 01/28/2016 12:01 PM    CBC Latest Ref Rng & Units 11/09/2020 04/20/2020 03/22/2020  WBC 4.0 - 10.5 K/uL 4.4 6.2 7.0  Hemoglobin 12.0 - 15.0 g/dL 12.6 13.1 12.0  Hematocrit 36.0 - 46.0 % 37.5 38.2 34.2(L)  Platelets 150.0 - 400.0 K/uL 199.0 179.0 245    Lab Results  Component Value Date/Time   VD25OH 37.48 11/09/2020 03:56 PM   VD25OH 38.87 04/20/2020 10:40 AM    Clinical ASCVD: No  The 10-year ASCVD risk score (Arnett DK,  et al., 2019) is: 23.2%   Values used to calculate the score:     Age: 76 years     Sex: Female     Is Non-Hispanic African American: No     Diabetic: No     Tobacco smoker: Yes     Systolic Blood Pressure: 650 mmHg     Is BP treated: Yes     HDL Cholesterol: 65.7 mg/dL     Total Cholesterol: 186 mg/dL      Social History   Tobacco Use  Smoking Status Every Day   Packs/day: 0.50   Years: 40.00   Pack years: 20.00   Types: Cigarettes  Smokeless Tobacco Never   BP Readings from Last 3 Encounters:  11/09/20 112/68  07/15/20 (!) 144/88  04/20/20  126/72   Pulse Readings from Last 3 Encounters:  11/09/20 72  07/15/20 76  04/20/20 78   Wt Readings from Last 3 Encounters:  11/09/20 127 lb 9.6 oz (57.9 kg)  07/15/20 126 lb 12.8 oz (57.5 kg)  04/20/20 126 lb 3.2 oz (57.2 kg)    Assessment: Review of patient past medical history, allergies, medications, health status, including review of consultants reports, laboratory and other test data, was performed as part of comprehensive evaluation and provision of chronic care management services.   SDOH:  (Social Determinants of Health) assessments and interventions performed:     CCM Care Plan  Allergies  Allergen Reactions   Medrol [Methylprednisolone] Swelling and Other (See Comments)    Facial and lip swelling, but no breathing issues   Rhopressa [Netarsudil Dimesylate] Itching   Amlodipine Other (See Comments)    Hip,legs,and feet feels achy and stiff   Lisinopril Rash   Losartan Anxiety   Penicillins Rash   Sulfonamide Derivatives Rash   Timolol Rash    Medications Reviewed Today     Reviewed by Cherre Robins, PharmD (Pharmacist) on 11/18/20 at [phone]  Med List Status: <None>   Medication Order Taking? Sig Documenting Provider Last Dose Status Informant  acetaminophen (TYLENOL) 325 MG tablet [id number] Yes Take 325 mg by mouth daily as needed for pain. [provider] Taking Active Self  aspirin EC 81 MG tablet [id number] Yes Take 81 mg by mouth daily. [provider] Taking Active Self  carvedilol (COREG) 3.125 MG tablet [id number] Yes TAKE 1 TABLET(3.125 MG) BY MOUTH TWICE DAILY WITH A MEAL Mosie Lukes, MD Taking Active   carvedilol (COREG) 3.125 MG tablet [id number] Yes TAKE 1 TABLET(3.125 MG) BY MOUTH TWICE DAILY WITH A MEAL Mosie Lukes, MD Taking Active   Multiple Vitamins-Minerals (HAIR/SKIN/NAILS/BIOTIN PO) [id number] Yes Take 500 mg by mouth 3 (three) times daily. [provider] Taking Active Self  potassium chloride SA (KLOR-CON) 20 MEQ  tablet [id number] Yes Take 20 mEq by mouth every Monday, Wednesday, and Friday. [provider] Taking Active   triamcinolone (KENALOG) 0.1 % [id number] Yes Apply 1 application topically daily as needed (affected areas). [provider] Taking Active Self  VYZULTA 0.024 % SOLN [id number] Yes Place 1 drop into both eyes at bedtime. [provider] Taking Active Self            Patient Active Problem List   Diagnosis Date Noted   Preventative health care 11/10/2020   History of UTI 11/10/2020   Retinal vein occlusion of right eye 04/20/2020   Arthritis 10/20/2019   Abdominal pain, RLQ 03/03/2018   SCC (squamous cell carcinoma), leg, left 09/03/2017   Hyponatremia 03/05/2017  Left hip pain 01/29/2017   Counseling and coordination of care 01/30/2016   Abnormal thyroid blood test 01/28/2016   Other iron deficiency anemias 08/02/2014   Hypercalcemia 08/02/2014   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   History of chicken pox    Rash and nonspecific skin eruption 07/23/2013   At risk for coronary artery disease 03/05/2013   Colon polyps 04/17/2011   Tremor 04/17/2011   Hyperlipidemia 03/29/2011   Palpitations 02/07/2011   Smoking 02/07/2011   CONSTIPATION 05/11/2008   Vitamin D deficiency 05/07/2008   Essential hypertension 05/07/2008   Osteoporosis 05/07/2008    Immunization History  Administered Date(s) Administered   Fluad Quad(high Dose 65+) 10/20/2019   Influenza Split 10/23/2010   Influenza, High Dose Seasonal PF 11/13/2016, 11/13/2017   Influenza,inj,Quad PF,6+ Mos 12/11/2012   Influenza-Unspecified 11/05/2013, 11/16/2014, 10/08/2020   Moderna Covid-19 Vaccine Bivalent Booster 31yr & up 10/08/2020   Moderna SARS-COV2 Booster Vaccination 10/08/2020   PFIZER(Purple Top)SARS-COV-2 Vaccination 03/01/2019, 03/26/2019, 11/22/2019, 05/21/2020   Pneumococcal Conjugate-13 06/23/2010, 05/11/2014   Tdap 04/01/2014   Zoster Recombinat (Shingrix) 10/12/2017,  12/14/2017   Zoster, Live 07/24/2006    Conditions to be addressed/monitored: HTN, Osteoporosis, and glaucoma; low vitamin D  There are no care plans that you recently modified to display for this patient.    Medication Assistance:  screened for LIS and PAP for Vyzulta - patient is not eligible for either. Requested tier exception form [employer] - decision pending.   Patient's [facility]:  [facility]Phone: [phone]: [phone] [address] AT NDick[facility] HospitalOF [address] [place]Phone: [phone]: [phone]   Follow Up:  Patient agrees to Care Plan and Follow-up.  Plan: Telephone follow up appointment with care management team member scheduled for:  January 2023  TCherre Robins PharmD Clinical Pharmacist LK[facility]stad[facility]

## 2021-02-01 NOTE — Patient Instructions (Signed)
Suzanne Turner,  It was a pleasure speaking with you today.  I have attached a summary of our visit today and information about your health goals.   Our next appointment is by telephone on May 16, 2021 at 10.30am (just a quick check in)   Please call the care guide team at [phone] if you need to cancel or reschedule your appointment.   If you have any questions or concerns, please feel free to contact me either at the phone number below or with a MyChart message.   Keep up the good work!  Cherre Robins, PharmD Clinical Pharmacist [facility] [phone] (direct line)  [phone] (main office number)  Glaucoma:  Controlled Managed by Dr Prudencio Burly Current therapy:  Vyzulta 0.024% eye drops - use 1 drop in each eye at bedtime Interventions:  Formulary review performed - Vyzulta is tier 4 which means for 2022 30 day copay is $95 and 90 day copay is $285 I contacted [employer] for tier exception to see if can get Vyzulta lowered to tier 2 or 3 which means copay for 90 days would be between $30 and $141; Was able to provide more information about other therapies tried in past. Should hear if approved in 72 hours.   Hypertension: Blood pressure goal <140/90 Current treatment:  Carvedilol 6.25mg  twice a day Interventions:   Reviewed blood pressure goal Continue current therapy for blood pressure  Osteoporosis / history of low serum vitamin D: Supplementation:  Calcium 1200 to 1500mg  daily from diet and/or supplements Vitamin D 2000 IU daily Last serum vitamin D was normal at 37.48 (checked 11/09/2020) Interventions:  Continue current supplementation Continue weight bearing exercise Consider recheck bone density in 2023 (after 05/27/2021)  Patient Goals/Self-Care Activities Over the next 90 days, patient will:  take medications as prescribed,  collaborate with provider on medication access solutions Continue to exercise with a target of at least  150 minutes of moderate intensity exercise weekly If you decide to try sertraline or if you want to try other medication for mood please contact office.   Follow Up Plan: Telephone follow up appointment with care management team member scheduled for:  3 to 4 months.   Patient verbalizes understanding of instructions provided today and agrees to view in [place].

## 2021-02-02 DIAGNOSIS — H34831 Tributary (branch) retinal vein occlusion, right eye, with macular edema: Secondary | ICD-10-CM | POA: Diagnosis not present

## 2021-02-14 ENCOUNTER — Ambulatory Visit: Payer: Medicare HMO | Admitting: Family Medicine

## 2021-02-22 DIAGNOSIS — I10 Essential (primary) hypertension: Secondary | ICD-10-CM

## 2021-02-22 DIAGNOSIS — H4010X0 Unspecified open-angle glaucoma, stage unspecified: Secondary | ICD-10-CM

## 2021-02-22 DIAGNOSIS — M810 Age-related osteoporosis without current pathological fracture: Secondary | ICD-10-CM

## 2021-02-28 ENCOUNTER — Telehealth: Payer: Self-pay | Admitting: Pharmacist

## 2021-02-28 NOTE — Progress Notes (Addendum)
I have contacted [employer] about patient Tier 2-3 approval on Vyzulta. They have approved the medication to Tier 3 with an estimated co-pay of $45 for 30 days supply on 02/02/21-01/22/22.  Corrie Mckusick, [place]

## 2021-03-02 NOTE — Telephone Encounter (Signed)
Patient notified

## 2021-03-04 DIAGNOSIS — H401122 Primary open-angle glaucoma, left eye, moderate stage: Secondary | ICD-10-CM | POA: Diagnosis not present

## 2021-03-04 DIAGNOSIS — H25043 Posterior subcapsular polar age-related cataract, bilateral: Secondary | ICD-10-CM | POA: Diagnosis not present

## 2021-03-04 DIAGNOSIS — H2513 Age-related nuclear cataract, bilateral: Secondary | ICD-10-CM | POA: Diagnosis not present

## 2021-03-14 DIAGNOSIS — H34831 Tributary (branch) retinal vein occlusion, right eye, with macular edema: Secondary | ICD-10-CM | POA: Diagnosis not present

## 2021-04-05 DIAGNOSIS — H2512 Age-related nuclear cataract, left eye: Secondary | ICD-10-CM | POA: Diagnosis not present

## 2021-04-05 DIAGNOSIS — H25812 Combined forms of age-related cataract, left eye: Secondary | ICD-10-CM | POA: Diagnosis not present

## 2021-04-05 DIAGNOSIS — H25042 Posterior subcapsular polar age-related cataract, left eye: Secondary | ICD-10-CM | POA: Diagnosis not present

## 2021-04-11 ENCOUNTER — Other Ambulatory Visit: Payer: Self-pay | Admitting: Family Medicine

## 2021-04-11 DIAGNOSIS — Z1231 Encounter for screening mammogram for malignant neoplasm of breast: Secondary | ICD-10-CM

## 2021-04-15 ENCOUNTER — Telehealth: Payer: Self-pay

## 2021-04-15 ENCOUNTER — Encounter: Payer: Self-pay | Admitting: Family

## 2021-04-15 ENCOUNTER — Telehealth (INDEPENDENT_AMBULATORY_CARE_PROVIDER_SITE_OTHER): Payer: Medicare HMO | Admitting: Family

## 2021-04-15 VITALS — Ht 67.0 in | Wt 128.0 lb

## 2021-04-15 DIAGNOSIS — J069 Acute upper respiratory infection, unspecified: Secondary | ICD-10-CM

## 2021-04-15 MED ORDER — AZITHROMYCIN 250 MG PO TABS: ORAL_TABLET | ORAL | 0 refills | Status: DC

## 2021-04-15 NOTE — Progress Notes (Signed)
?Suzanne Turner is a 76 y.o. female with the following history as recorded in EpicCare:  ?Patient Active Problem List  ? Diagnosis Date Noted  ? Preventative health care 11/10/2020  ? History of UTI 11/10/2020  ? 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 Arthritis 10/20/2019  ? Abdominal pain, RLQ 03/03/2018  ? SCC (squamous cell carcinoma), leg, left 09/03/2017  ? Hyponatremia 03/05/2017  ? Left hip pain 01/29/2017  ? Counseling and coordination of care 01/30/2016  ? Abnormal thyroid blood test 01/28/2016  ? Other iron deficiency anemias 08/02/2014  ? Hypercalcemia 08/02/2014  ? 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 History of chicken pox   ? Rash and nonspecific skin eruption 07/23/2013  ? At risk for coronary artery disease 03/05/2013  ? Colon polyps 04/17/2011  ? Tremor 04/17/2011  ? Hyperlipidemia 03/29/2011  ? Palpitations 02/07/2011  ? Smoking 02/07/2011  ? CONSTIPATION 05/11/2008  ? Vitamin D deficiency 05/07/2008  ? Essential hypertension 05/07/2008  ? Osteoporosis 05/07/2008  ?  ?Current Outpatient Medications  ?Medication Sig Dispense Refill  ? acetaminophen (TYLENOL) 325 MG tablet Take 325 mg by mouth daily as needed for pain.    ? aspirin EC 81 MG tablet Take 81 mg by mouth daily.    ? azithromycin (ZITHROMAX) 250 MG tablet 2 tabs po qd x 1 day; 1 tablet per day x 4 days; 6 tablet 0  ? carvedilol (COREG) 3.125 MG tablet TAKE 1 TABLET(3.125 MG) BY MOUTH TWICE DAILY WITH A MEAL 60 tablet 5  ? Multiple Vitamins-Minerals (HAIR/SKIN/NAILS/BIOTIN PO) Take 500 mg by mouth 3 (three) times daily.    ? triamcinolone (KENALOG) 0.1 % Apply 1 application topically daily as needed (affected areas).    ? VYZULTA 0.024 % SOLN Place 1 drop into both eyes at bedtime.    ? LUMIGAN 0.01 % SOLN daily. (Patient not taking: Reported on 04/15/2021)    ? potassium chloride SA (KLOR-CON) 20 MEQ tablet Take 20 mEq by mouth every Monday, Wednesday, and Friday. (Patient not taking: Reported on 04/15/2021)    ? ?No current  facility-administered medications for this visit.  ?  ?Allergies: Medrol [methylprednisolone], Alphagan [brimonidine], Rhopressa [netarsudil dimesylate], Zioptan [tafluprost (pf)], Amlodipine, Lisinopril, Losartan, Penicillins, Sulfonamide derivatives, and Timolol  ?Past Medical History:  ?Diagnosis Date  ? Abnormal thyroid blood test 01/28/2016  ? Bronchitis   ? Cataract   ? Chronic eczema   ? Glaucoma   ? Left eye  ? Glaucoma 07/28/2014  ? Left eye Dr Shon Hough at [facility]  ? H/O measles   ? H/O mumps   ? High cholesterol   ? no medications  ? History of chicken pox   ? Hypercalcemia 08/02/2014  ? Hypertension   ? Kidney stone   ? 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 Left eye Dr Shon Hough at [facility]   ? Osteoarthritis   ? Osteopenia   ? Preventative health care 01/30/2016  ? Salivary duct stone   ? Skin cancer   ? Wears glasses   ?  ?Past Surgical History:  ?Procedure Laterality Date  ? ORIF [place] Right 1966  ? REFRACTIVE SURGERY Left March/April 2018  ? SALIVARY STONE REMOVAL Right 05/15/2012  ? Procedure: EXCISION OF RIGHT SUBMANDIBULAR GLAND ;  Surgeon: Izora Gala, MD;  Location: [facility];  Service: ENT;  Laterality: Right;  ? SALIVARY STONE REMOVAL N/A 11/18/2012  ? Procedure: INTRAORAL EXCISION SUBMANDIBULAR STONE RIGHT ;  Surgeon: Izora Gala, MD;  Location: [facility];  Service: ENT;  Laterality: N/A;  ? SKIN BIOPSY Right 12/2012  ? legs  ? TONSILLECTOMY  1949  ? TUBAL LIGATION  1977  ?  ?Family History  ?Problem Relation Age of Onset  ? Arrhythmia Mother   ? Colon cancer Mother 23  ? Cancer Mother   ? Tremor Mother   ? Arrhythmia Maternal Grandmother   ? Glaucoma Maternal Grandmother   ? Breast cancer Sister   ? Cancer Sister   ?     breast  ? Glaucoma Sister   ? Heart disease Father 45  ? Arthritis Father   ?     broken back with chronic pain  ? Glaucoma Father   ? Heart disease Brother   ? Arrhythmia Brother   ? Tremor Brother   ? Hypertension  Daughter   ? Arrhythmia Maternal Uncle   ? Heart disease Maternal Uncle   ? Colon polyps Maternal Aunt   ?  ?Social History  ? ?Tobacco Use  ? Smoking status: Every Day  ?  Packs/day: 0.50  ?  Years: 40.00  ?  Pack years: 20.00  ?  Types: Cigarettes  ? Smokeless tobacco: Never  ?Substance Use Topics  ? Alcohol use: No  ?  ?Subjective:  ? ?I connected with Suzanne Turner on 04/15/21 at  1:20 PM EDT by a  and verified that I am speaking with the correct person using two identifiers. ?  ?I discussed the limitations of evaluation and management by telemedicine and the availability of in person appointments. The patient expressed understanding and agreed to proceed. Provider in office/ patient is at home; provider and patient are only 2 people on video call.  ? ?Patient started Wednesday with "flu like symptoms" with body aches, congestion, headache, diarrhea; has taken 2 home COVID tests which were both negative; is currently on prednisone eye drops- recovering from cataract surgery 2 weeks ago; feels like stress level has been up; no chest pain or shortness of breath or difficulty breathing; is concerned about upcoming weekend;  ? ? ? ? ? ?Objective:  ?Vitals:  ? 04/15/21 1259  ?Weight: 128 lb (58.1 kg)  ?Height: '5\' 7"'$  (1.702 m)  ?  ?Lungs: Respirations unlabored;  ?Neurologic: Alert and oriented; speech intact;  ? ?Assessment:  ?1. Viral URI   ?  ?Plan:  ?Reassurance- continue symptomatic treatment; increase fluids, rest; Rx for Z-pak is called to pharmacy and patient in agreement not to fill before 48 more hours; call back worse, no better- will need in office visit if no improvement.  ? ?Time spent 10 minutes ? ?No follow-ups on file.  ?No orders of the defined types were placed in this encounter. ?  ?Requested Prescriptions  ? ?Signed Prescriptions Disp Refills  ? azithromycin (ZITHROMAX) 250 MG tablet 6 tablet 0  ?  Sig: 2 tabs po qd x 1 day; 1 tablet per day x 4 days;  ?  ? ?

## 2021-04-15 NOTE — Telephone Encounter (Signed)
Noted concerns will be addressed at appointment  ?

## 2021-04-15 NOTE — Telephone Encounter (Signed)
Pt called feeling pretty ill. Declined in office OV since she lives in [place] and does not feel up to traveling. She has tested negative for covid twice. VV has been scheduled this PM with yall. ?

## 2021-04-17 ENCOUNTER — Emergency Department (HOSPITAL_BASED_OUTPATIENT_CLINIC_OR_DEPARTMENT_OTHER)
Admission: EM | Admit: 2021-04-17 | Discharge: 2021-04-17 | Disposition: A | Discharge status: Home / Self Care | Payer: Medicare HMO | Attending: Emergency Medicine | Admitting: Emergency Medicine

## 2021-04-17 ENCOUNTER — Emergency Department (HOSPITAL_BASED_OUTPATIENT_CLINIC_OR_DEPARTMENT_OTHER): Payer: Medicare HMO | Admitting: Radiology

## 2021-04-17 ENCOUNTER — Other Ambulatory Visit: Payer: Self-pay

## 2021-04-17 ENCOUNTER — Encounter (HOSPITAL_BASED_OUTPATIENT_CLINIC_OR_DEPARTMENT_OTHER): Payer: Self-pay

## 2021-04-17 DIAGNOSIS — B9789 Other viral agents as the cause of diseases classified elsewhere: Secondary | ICD-10-CM | POA: Diagnosis not present

## 2021-04-17 DIAGNOSIS — Z20822 Contact with and (suspected) exposure to covid-19: Secondary | ICD-10-CM | POA: Diagnosis not present

## 2021-04-17 DIAGNOSIS — F17200 Nicotine dependence, unspecified, uncomplicated: Secondary | ICD-10-CM | POA: Insufficient documentation

## 2021-04-17 DIAGNOSIS — J449 Chronic obstructive pulmonary disease, unspecified: Secondary | ICD-10-CM | POA: Diagnosis not present

## 2021-04-17 DIAGNOSIS — Z7982 Long term (current) use of aspirin: Secondary | ICD-10-CM | POA: Diagnosis not present

## 2021-04-17 DIAGNOSIS — J069 Acute upper respiratory infection, unspecified: Secondary | ICD-10-CM | POA: Insufficient documentation

## 2021-04-17 DIAGNOSIS — J441 Chronic obstructive pulmonary disease with (acute) exacerbation: Secondary | ICD-10-CM | POA: Insufficient documentation

## 2021-04-17 DIAGNOSIS — Z79899 Other long term (current) drug therapy: Secondary | ICD-10-CM | POA: Insufficient documentation

## 2021-04-17 DIAGNOSIS — R0603 Acute respiratory distress: Secondary | ICD-10-CM

## 2021-04-17 DIAGNOSIS — I10 Essential (primary) hypertension: Secondary | ICD-10-CM | POA: Diagnosis not present

## 2021-04-17 DIAGNOSIS — R0602 Shortness of breath: Secondary | ICD-10-CM | POA: Diagnosis not present

## 2021-04-17 DIAGNOSIS — R059 Cough, unspecified: Secondary | ICD-10-CM | POA: Diagnosis not present

## 2021-04-17 LAB — BASIC METABOLIC PANEL
Anion gap: 11 (ref 5–15)
BUN: 10 mg/dL (ref 8–23)
CO2: 22 mmol/L (ref 22–32)
Calcium: 10 mg/dL (ref 8.9–10.3)
Chloride: 96 mmol/L — ABNORMAL LOW (ref 98–111)
Creatinine, Ser: 0.73 mg/dL (ref 0.44–1.00)
GFR, Estimated: >60 mL/min (ref >60)
Glucose, Bld: 117 mg/dL — ABNORMAL HIGH (ref 70–99)
Potassium: 3 mmol/L — ABNORMAL LOW (ref 3.5–5.1)
Sodium: 129 mmol/L — ABNORMAL LOW (ref 135–145)

## 2021-04-17 LAB — CBC WITH DIFFERENTIAL/PLATELET
Abs Immature Granulocytes: 0.02 10*3/uL (ref 0.00–0.07)
Basophils Absolute: 0 10*3/uL (ref 0.0–0.1)
Basophils Relative: 0 %
Eosinophils Absolute: 0 10*3/uL (ref 0.0–0.5)
Eosinophils Relative: 0 %
HCT: 36.9 % (ref 36.0–46.0)
Hemoglobin: 12.8 g/dL (ref 12.0–15.0)
Immature Granulocytes: 0 %
Lymphocytes Relative: 5 %
Lymphs Abs: 0.4 10*3/uL — ABNORMAL LOW (ref 0.7–4.0)
MCH: 31.1 pg (ref 26.0–34.0)
MCHC: 34.7 g/dL (ref 30.0–36.0)
MCV: 89.6 fL (ref 80.0–100.0)
Monocytes Absolute: 0.6 10*3/uL (ref 0.1–1.0)
Monocytes Relative: 6 %
Neutro Abs: 7.9 10*3/uL — ABNORMAL HIGH (ref 1.7–7.7)
Neutrophils Relative %: 89 %
Platelets: 147 10*3/uL — ABNORMAL LOW (ref 150–400)
RBC: 4.12 MIL/uL (ref 3.87–5.11)
RDW: 13.2 % (ref 11.5–15.5)
WBC: 9 10*3/uL (ref 4.0–10.5)
nRBC: 0 % (ref 0.0–0.2)

## 2021-04-17 LAB — TROPONIN I (HIGH SENSITIVITY): Troponin I (High Sensitivity): 9 ng/L (ref <18)

## 2021-04-17 LAB — RESP PANEL BY RT-PCR (FLU A&B, COVID) ARPGX2
Influenza A by PCR: NEGATIVE
Influenza B by PCR: NEGATIVE
SARS Coronavirus 2 by RT PCR: NEGATIVE

## 2021-04-17 MED ORDER — METHYLPREDNISOLONE SODIUM SUCC 125 MG IJ SOLR
125.0000 mg | Freq: Once | INTRAMUSCULAR | Status: AC
Start: 2021-04-17 — End: 2021-04-17
  Administered 2021-04-17: 125 mg via INTRAVENOUS
  Filled 2021-04-17: qty 2

## 2021-04-17 MED ORDER — AEROCHAMBER PLUS FLO-VU LARGE MISC
1.0000 | Freq: Once | Status: AC
  Administered 2021-04-17: 1
  Filled 2021-04-17: qty 1

## 2021-04-17 MED ORDER — DOXYCYCLINE HYCLATE 100 MG PO CAPS: 100.0000 mg | ORAL_CAPSULE | Freq: Two times a day (BID) | ORAL | 0 refills | Status: DC

## 2021-04-17 MED ORDER — IPRATROPIUM-ALBUTEROL 0.5-2.5 (3) MG/3ML IN SOLN
RESPIRATORY_TRACT | Status: AC
  Filled 2021-04-17: qty 3

## 2021-04-17 MED ORDER — MAGNESIUM SULFATE 2 GM/50ML IV SOLN
2.0000 g | Freq: Once | INTRAVENOUS | Status: AC
  Administered 2021-04-17: 2 g via INTRAVENOUS
  Filled 2021-04-17: qty 50

## 2021-04-17 MED ORDER — IPRATROPIUM-ALBUTEROL 0.5-2.5 (3) MG/3ML IN SOLN
3.0000 mL | Freq: Once | RESPIRATORY_TRACT | Status: AC
  Administered 2021-04-17: 3 mL via RESPIRATORY_TRACT

## 2021-04-17 MED ORDER — BUDESONIDE-FORMOTEROL FUMARATE 80-4.5 MCG/ACT IN AERO: 2.0000 | INHALATION_SPRAY | Freq: Two times a day (BID) | RESPIRATORY_TRACT | 12 refills | Status: DC

## 2021-04-17 MED ORDER — POTASSIUM CHLORIDE CRYS ER 20 MEQ PO TBCR
40.0000 meq | EXTENDED_RELEASE_TABLET | Freq: Once | ORAL | Status: AC
  Administered 2021-04-17: 40 meq via ORAL
  Filled 2021-04-17: qty 2

## 2021-04-17 MED ORDER — ALBUTEROL SULFATE HFA 108 (90 BASE) MCG/ACT IN AERS
1.0000 | INHALATION_SPRAY | Freq: Once | RESPIRATORY_TRACT | Status: AC
  Administered 2021-04-17: 1 via RESPIRATORY_TRACT
  Filled 2021-04-17: qty 6.7

## 2021-04-17 MED ORDER — SODIUM CHLORIDE 0.9 % IV BOLUS
500.0000 mL | Freq: Once | INTRAVENOUS | Status: AC
  Administered 2021-04-17: 500 mL via INTRAVENOUS

## 2021-04-17 MED ORDER — IPRATROPIUM-ALBUTEROL 0.5-2.5 (3) MG/3ML IN SOLN
3.0000 mL | Freq: Once | RESPIRATORY_TRACT | Status: AC
  Administered 2021-04-17: 3 mL via RESPIRATORY_TRACT
  Filled 2021-04-17: qty 3

## 2021-04-17 MED ORDER — METOPROLOL TARTRATE 5 MG/5ML IV SOLN
5.0000 mg | Freq: Once | INTRAVENOUS | Status: AC
  Administered 2021-04-17: 5 mg via INTRAVENOUS
  Filled 2021-04-17: qty 5

## 2021-04-17 NOTE — ED Notes (Signed)
Pt walked to Bathroom still jittery and is [facility] with any exertion.  When returned to room initial sats were 92% but immediately returned to 98% ?

## 2021-04-17 NOTE — ED Notes (Signed)
Lab called and trop added to her labs sent earlier ? ?

## 2021-04-17 NOTE — ED Notes (Signed)
Awaiting RT to do teaching then will DC home when complete ? ?

## 2021-04-17 NOTE — ED Notes (Signed)
RT educated pt on proper use of MDI w/spacer and educated pt on smoking cessation. Pt verbalizes understanding of teaching and information.  ?

## 2021-04-17 NOTE — ED Provider Notes (Signed)
?[facility] EMERGENCY DEPT ?Provider Note ? ? ?CSN: [id number] ?Arrival date & time: 04/17/21  1635 ? ?  ? ?History ? ?Chief Complaint  ?Patient presents with  ? Shortness of Breath  ? ? ?Suzanne Turner is a 76 y.o. female. ? ?Patient is a 76 year old female with past medical history of hypertension presenting for complaints of shortness of breath.  Patient admits to cough, subjective fevers, wheezing, chest tightness, and shortness of breath over the last 5 days. ? ?The history is provided by the patient. No language interpreter was used.  ?Shortness of Breath ?Associated symptoms: cough, fever and wheezing   ?Associated symptoms: no abdominal pain, no chest pain, no ear pain, no rash, no sore throat and no vomiting   ? ?  ? ?Home Medications ?Prior to Admission medications   ?Medication Sig Start Date End Date Taking? Authorizing Provider  ?acetaminophen (TYLENOL) 325 MG tablet Take 325 mg by mouth daily as needed for pain.    [provider]  ?aspirin EC 81 MG tablet Take 81 mg by mouth daily.    [provider]  ?azithromycin (ZITHROMAX) 250 MG tablet 2 tabs po qd x 1 day; 1 tablet per day x 4 days; 04/15/21   Marrian Salvage, [place]  ?carvedilol (COREG) 3.125 MG tablet TAKE 1 TABLET(3.125 MG) BY MOUTH TWICE DAILY WITH A MEAL 08/19/20   Mosie Lukes, MD  ?LUMIGAN 0.01 % SOLN daily. ?Patient not taking: Reported on 04/15/2021 12/07/20   [provider]  ?Multiple Vitamins-Minerals (HAIR/SKIN/NAILS/BIOTIN PO) Take 500 mg by mouth 3 (three) times daily.    [provider]  ?potassium chloride SA (KLOR-CON) 20 MEQ tablet Take 20 mEq by mouth every Monday, Wednesday, and Friday. ?Patient not taking: Reported on 04/15/2021    [provider]  ?triamcinolone (KENALOG) 0.1 % Apply 1 application topically daily as needed (affected areas).    [provider]  ?VYZULTA 0.024 % SOLN Place 1 drop into both eyes at bedtime.    [provider]  ?    ? ?Allergies    ?Medrol [methylprednisolone], Alphagan [brimonidine], Rhopressa [netarsudil dimesylate], Zioptan [tafluprost (pf)], Amlodipine, Lisinopril, Losartan, Penicillins, Sulfonamide derivatives, and Timolol   ? ?Review of Systems   ?Review of Systems  ?Constitutional:  Positive for chills and fever.  ?HENT:  Negative for ear pain and sore throat.   ?Eyes:  Negative for pain and visual disturbance.  ?Respiratory:  Positive for cough, shortness of breath and wheezing.   ?Cardiovascular:  Negative for chest pain and palpitations.  ?Gastrointestinal:  Negative for abdominal pain and vomiting.  ?Genitourinary:  Negative for dysuria and hematuria.  ?Musculoskeletal:  Negative for arthralgias and back pain.  ?Skin:  Negative for color change and rash.  ?Neurological:  Negative for seizures and syncope.  ?All other systems reviewed and are negative. ? ?Physical Exam ?Updated Vital Signs ?BP (!) 192/102   Pulse 100   Temp 99.5 ?F (37.5 ?C) (Oral)   Resp 20   SpO2 96%  ?Physical Exam ?Vitals and nursing note reviewed.  ?Constitutional:   ?   General: She is not in acute distress. ?   Appearance: She is well-developed.  ?HENT:  ?   Head: Normocephalic and atraumatic.  ?Eyes:  ?   Conjunctiva/sclera: Conjunctivae normal.  ?Cardiovascular:  ?   Rate and Rhythm: Normal rate and regular rhythm.  ?   Heart sounds: No murmur heard. ?Pulmonary:  ?   Effort: Tachypnea and respiratory distress present.  ?  Breath sounds: Examination of the right-upper field reveals wheezing. Examination of the left-upper field reveals wheezing. Examination of the right-middle field reveals wheezing. Examination of the left-middle field reveals wheezing. Examination of the right-lower field reveals wheezing. Examination of the left-lower field reveals wheezing. Wheezing present.  ?Abdominal:  ?   Palpations: Abdomen is soft.  ?   Tenderness: There is no abdominal tenderness.  ?Musculoskeletal:     ?   General: No swelling.  ?   Cervical  back: Neck supple.  ?Skin: ?   General: Skin is warm and dry.  ?   Capillary Refill: Capillary refill takes less than 2 seconds.  ?Neurological:  ?   Mental Status: She is alert.  ?Psychiatric:     ?   Mood and Affect: Mood normal.  ? ? ?ED Results / Procedures / Treatments   ?Labs ?(all labs ordered are listed, but only abnormal results are displayed) ?Labs Reviewed  ?CBC WITH DIFFERENTIAL/PLATELET - Abnormal; Notable for the following components:  ?    Result Value  ? Platelets 147 (*)   ? Neutro Abs 7.9 (*)   ? Lymphs Abs 0.4 (*)   ? All other components within normal limits  ?BASIC METABOLIC PANEL - Abnormal; Notable for the following components:  ? Sodium 129 (*)   ? Potassium 3.0 (*)   ? Chloride 96 (*)   ? Glucose, Bld 117 (*)   ? All other components within normal limits  ?RESP PANEL BY RT-PCR (FLU A&B, COVID) [id number]  ?TROPONIN I (HIGH SENSITIVITY)  ? ? ?EKG ?None ? ?Radiology ?DG Chest 2 View ? ?Result Date: 04/17/2021 ?CLINICAL DATA:  Shortness of breath EXAM: CHEST - 2 VIEW COMPARISON:  03/22/2020 FINDINGS: Cardiac size is within normal limits. Thoracic aorta is tortuous and ectatic. Increase in AP diameter of chest suggests COPD. There are no signs of pulmonary edema or new focal infiltrates. There are linear densities in both apices suggesting scarring. Right cervical rib is seen. IMPRESSION: COPD.  There are no new infiltrates or signs of pulmonary edema. Electronically Signed   By: Elmer Picker M.D.   On: 04/17/2021 17:55   ? ?Procedures ?Procedures  ? ? ?Medications Ordered in ED ?Medications  ?magnesium sulfate IVPB 2 g 50 mL (2 g Intravenous New Bag/Given 04/17/21 1855)  ?potassium chloride SA (KLOR-CON M) CR tablet 40 mEq (has no administration in time range)  ?sodium chloride 0.9 % bolus 500 mL (has no administration in time range)  ?ipratropium-albuterol (DUONEB) 0.5-2.5 (3) MG/3ML nebulizer solution 3 mL (3 mLs Nebulization Given 04/17/21 1720)  ?ipratropium-albuterol (DUONEB) 0.5-2.5 (3)  MG/3ML nebulizer solution 3 mL (3 mLs Nebulization Not Given 04/17/21 1731)  ?methylPREDNISolone sodium succinate (SOLU-MEDROL) 125 mg/2 mL injection 125 mg (125 mg Intravenous Given 04/17/21 1852)  ? ? ?ED Course/ Medical Decision Making/ A&P ?  ?                        ?Medical Decision Making ?Amount and/or Complexity of Data Reviewed ?Labs: ordered. ?Radiology: ordered. ? ?Risk ?Prescription drug management. ? ? ?61:26 PM ?76 year old female with past medical history of hypertension presenting for complaints of shortness of breath.  Patient is alert and oriented x3, acute respiratory distress, tachycardic, tachypneic, and wheezing in the lung fields.  Smoker.  Concern for COPD exacerbation.  Solu-Medrol and DuoNebs given. ? ?Reevaluation, patient has improvement of breathing.  Wheezing at this time.  Continue DuoNeb treatment. ? ?On reevaluation, patient admits to  complete resolution of shortness of breath and wheezing at this time.  Sounds are clear bilaterally with no adventitious lung sounds.  Laboratory studies. I independently interpreted patient's labs and EKG. No ST segment elevation or depression.  Troponin within normal limits.  Stable chest x-ray without pneumonia. ? ?Patient symptoms likely COPD exacerbation secondary to viral URI.  Patient requesting to return home at this time.  Albuterol inhaler given in ED.  Prescription for doxycycline given for outpatient management.  Offered and declined prednisone stating "I get crazy on that stuff". ? ?Patient in no distress and overall condition improved here in the ED. Detailed discussions were had with the patient regarding current findings, and need for close f/u with PCP or on call doctor. The patient has been instructed to return immediately if the symptoms worsen in any way for re-evaluation. Patient verbalized understanding and is in agreement with current care plan. All questions answered prior to discharge. ? ? ? ? ? ? ? ? ?Final Clinical Impression(s)  / ED Diagnoses ?Final diagnoses:  ?COPD exacerbation ([place])  ?Viral URI with cough  ?Respiratory distress  ? ? ?Rx / DC Orders ?ED Discharge Orders   ? ? None  ? ?  ? ? ?  ?Campbell Stall P, DO ?[birth year] [phone]

## 2021-04-27 DIAGNOSIS — H34831 Tributary (branch) retinal vein occlusion, right eye, with macular edema: Secondary | ICD-10-CM | POA: Diagnosis not present

## 2021-05-10 ENCOUNTER — Ambulatory Visit: Payer: Medicare HMO | Admitting: Family Medicine

## 2021-05-16 ENCOUNTER — Ambulatory Visit (INDEPENDENT_AMBULATORY_CARE_PROVIDER_SITE_OTHER): Payer: Medicare HMO | Admitting: Pharmacist

## 2021-05-16 ENCOUNTER — Other Ambulatory Visit: Payer: Self-pay | Admitting: Family Medicine

## 2021-05-16 DIAGNOSIS — E871 Hypo-osmolality and hyponatremia: Secondary | ICD-10-CM

## 2021-05-16 DIAGNOSIS — E876 Hypokalemia: Secondary | ICD-10-CM

## 2021-05-16 DIAGNOSIS — I10 Essential (primary) hypertension: Secondary | ICD-10-CM

## 2021-05-16 DIAGNOSIS — H4010X0 Unspecified open-angle glaucoma, stage unspecified: Secondary | ICD-10-CM

## 2021-05-16 NOTE — Patient Instructions (Signed)
?  Suzanne Turner,  ?It was a pleasure speaking with you  ?Below is a summary of your health goals and care plan ? ?Patient Goals/Self-Care Activities ?take medications as prescribed,  ?collaborate with provider on medication access solutions ?Continue to exercise with a target of at least 150 minutes of moderate intensity exercise weekly ?Great job not smoking!! Continue to use nicotine lozenges. If you have a craving for cigarettes - call 1-800-QUIT-NOW for support 24 hours, 7 days per week. ?Can use Symbicort if needed for shortness of breath. Rememeber to rinse mouth after each use.  ?Come in for labs 05/20/2021 at 10am ? ? ? ?If you have any questions or concerns, please feel free to contact me either at the phone number below or with a MyChart message.  ? ?Keep up the good work! ? ?Cherre Robins, PharmD ?Clinical Pharmacist ?[facility] ?[facility] ?[phone] (direct line)  ?[phone] (main office number) ? ? ? ?Patient verbalizes understanding of instructions and care plan provided today and agrees to view in [place]. Active MyChart status confirmed with patient.    ?

## 2021-05-16 NOTE — Chronic Care Management (AMB) (Signed)
? ? ?Chronic Care Management ?Pharmacy Note ? ?05/16/2021 ?Name:  Suzanne Turner MRN:  [id number] DOB:  03/16/1945 ? ?Summary: ?Patient previously was having difficulty with finances. Copay for Vyzulta high. In January 2023 requested tier exception from [facility] again with additional information on past therapies from Dr Prudencio Burly office. Was approved 02/28/2021 and monthly copay decreased from $95/month to $47/month.  ?Since our last visit patient has been to ED for COPD exacerbation but I did not note prior diagnosis of COPD. Patient was prescribed Symbicort 62mg 2 puffs twice a day in ED but she reports was instructed to use as a rescue when she has difficulty breathing. Patient has not smoked since her ED Visit and has not used Symbicort. She is using nicotine lozenges as needed for cravings. Discussed other options for smoking cessation. Patient declined Chantix or Zyban due to history of difficulty tolerating sertraline. Provided information for support with 24-7 1-800 QUIT-NOW line.  ?Also noted patient had hyponatremia and hypokalemia during ED visit. She has taken potassium supplements in the past when she was taking hydrochlorothiazide regularly. Ordered BMP - patient to come in 05/20/2021 to have BMP drawn.  ? ? ?Subjective: ?Suzanne COBURNis an 76y.o. year old female who is a primary patient of BMosie Lukes MD.  The CCM team was consulted for assistance with disease management and care coordination needs.   ? ?Engaged with patient by telephone for follow up visit in response to provider referral for pharmacy case management and/or care coordination services.  ? ?Consent to Services:  ?The patient was given information about Chronic Care Management services, agreed to services, and gave verbal consent prior to initiation of services.  Please see initial visit note for detailed documentation.  ? ?Patient Care Team: ?BMosie Lukes MD as PCP - General (Family Medicine) ?MLarey Dresser MD as Consulting  Physician (Cardiology) ?SShon Hough MD as Consulting Physician (Ophthalmology) ?Alexander L. GGordy Levan DDS as Consulting Physician (Dentistry) ?ECherre Robins RPH-CPP (Pharmacist) ? ?Recent office visits: ?04/15/2021 - Int Med (Valere Dross NP) Video Visit for viral URI. Azithromycin Rx given with instructions not to start unless symptoms do not improve over next 48 hours.  ?01/28/2020 - PCP (Dr BCharlett Blake MMilbank- patient notified PCP that after reading potential side effects of sertraline she decided not to take it.  ?01/04/2021 - PCP (Dr BCharlett Blake MThayer- not tolerating fluoxetine. Causing loss of appetite and tiredness. Changed to sertraline 562m- take 2555mor 2 weeks then 62m40mily ?11/09/2020 - PCP (Dr BlytCharlett Blakeen for comprehensive physical exam.  ? 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  ? ? ?Hospital visits: ?04/17/2021 - [facility] and Duo Neb in ED with resolution of shortness of breath.  ?Medications started at [facility]: budesonide-femoterol inhaler 80mc93m2 puffs twice a day and doxycycline 100mg 65me a day. ? ?Objective: ? ?Lab Results  ?Component Value Date  ? CREATININE 0.73 04/17/2021  ? CREATININE 0.74 11/09/2020  ? CREATININE 0.81 04/20/2020  ? ? ?Lab Results  ?Component Value Date  ? HGBA1C 5.6 11/09/2020  ? ?Last diabetic Eye exam: No results found for: HMDIABEYEEXA  ?Last diabetic Foot exam: No results found for: HMDIABFOOTEX  ? ?   ?Component Value Date/Time  ?  CHOL 186 11/09/2020 1123  ? TRIG 100.0 11/09/2020 1123  ? HDL 65.70 11/09/2020 1123  ? CHOLHDL 3 11/09/2020 1123  ? VLDL 20.0 11/09/2020 1123  ? LDLCALC 101 (H) 11/09/2020 1123  ? LDLCALC 126 (H) 10/20/2019 1006  ? ? ? ?   Latest Ref Rng & Units 11/09/2020  ? 11:23 AM 04/20/2020  ? 10:40 AM 03/24/2020  ?  9:03 AM  ?Hepatic Function  ?Total Protein 6.0 - 8.3 g/dL 7.3   7.5   7.0    ?Albumin 3.5 - 5.2 g/dL 4.3   4.4   3.9    ?AST 0 - 37 U/L 22   20   20     ?ALT 0 - 35 U/L 14   12   17     ?Alk Phosphatase 39 - 117 U/L 63   68   77    ?Total Bilirubin 0.2 - 1.2 mg/dL 0.6   0.6   0.5    ? ? ?Lab Results  ?Component Value Date/Time  ? TSH 2.34 11/09/2020 11:23 AM  ? TSH 2.76 04/20/2020 10:40 AM  ? FREET4 0.85 01/28/2016 12:01 PM  ? ? ? ?  Latest Ref Rng & Units 04/17/2021  ?  6:45 PM 11/09/2020  ? 11:23 AM 04/20/2020  ? 10:40 AM  ?CBC  ?WBC 4.0 - 10.5 K/uL 9.0   4.4   6.2    ?Hemoglobin 12.0 - 15.0 g/dL 12.8   12.6   13.1    ?Hematocrit 36.0 - 46.0 % 36.9   37.5   38.2    ?Platelets 150 - 400 K/uL 147   199.0   179.0    ? ? ?Lab Results  ?Component Value Date/Time  ? VD25OH 37.48 11/09/2020 03:56 PM  ? VD25OH 38.87 04/20/2020 10:40 AM  ? ? ?Clinical ASCVD: No  ?The 10-year ASCVD risk score (Arnett DK, et al., 2019) is: 43.5% ?  Values used to calculate the score: ?    Age: 76 years ?    Sex: Female ?    Is Non-Hispanic African American: No ?    Diabetic: No ?    Tobacco smoker: Yes ?    Systolic Blood Pressure: 657 mmHg ?    Is BP treated: Yes ?    HDL Cholesterol: 65.7 mg/dL ?    Total Cholesterol: 186 mg/dL   ? ? ? ?Social History  ? ?Tobacco Use  ?Smoking Status Every Day  ? Packs/day: 0.50  ? Years: 40.00  ? Pack years: 20.00  ? Types: Cigarettes  ?Smokeless Tobacco Never  ? ?BP Readings from Last 3 Encounters:  ?04/17/21 (!) 156/77  ?11/09/20 112/68  ?07/15/20 (!) 144/88  ? ?Pulse Readings from Last 3 Encounters:  ?04/17/21 89  ?11/09/20 72  ?07/15/20 76  ? ?Wt Readings from Last 3 Encounters:  ?04/15/21 128 lb (58.1 kg)  ?11/09/20 127 lb 9.6 oz (57.9 kg)  ?07/15/20 126 lb 12.8 oz (57.5 kg)  ? ? ?Assessment: Review of patient past medical history, allergies, medications, health status, including review of consultants reports, laboratory and  other test data, was performed as part of comprehensive evaluation and provision of chronic care management services.  ? ?SDOH:  (Social Determinants of Health) assessments and interventions performed:  ? ? ? ?CCM Care Plan ? ?Allergies  ?Allergen Reactions  ? Medrol [Methylprednisolone] Swelling and Other (See Comments)  ?  Facial and lip swelling, but no breathing issues  ? Alphagan [Brimonidine] Other (See Comments)  ?  Eye  irritation / eye fatigue  ? Rhopressa [Netarsudil Dimesylate] Itching  ? Zioptan [Tafluprost (Pf)] Other (See Comments)  ?  Eye irritation  ? Amlodipine Other (See Comments)  ?  Hip,legs,and feet feels achy and stiff  ? Lisinopril Rash  ? Losartan Anxiety  ? Penicillins Rash  ? Sulfonamide Derivatives Rash  ? Timolol Rash  ? ? ?Medications Reviewed Today   ? ? Reviewed by Cherre Robins, RPH-CPP (Pharmacist) on 05/16/21 at 1056  Med List Status: <None>  ? ?Medication Order Taking? Sig Documenting Provider Last Dose Status Informant  ?acetaminophen (TYLENOL) 325 MG tablet [id number] Yes Take 325 mg by mouth daily as needed for pain. [provider] Taking Active Self  ?aspirin EC 81 MG tablet [id number] Yes Take 81 mg by mouth daily. [provider] Taking Active Self  ?budesonide-formoterol (SYMBICORT) 80-4.5 MCG/ACT inhaler [id number] No Inhale 2 puffs into the lungs 2 (two) times daily.  ?Patient not taking: Reported on 05/16/2021  ? Lianne Cure, DO Not Taking Active   ?carvedilol (COREG) 3.125 MG tablet [id number] Yes TAKE 1 TABLET(3.125 MG) BY MOUTH TWICE DAILY WITH A MEAL Mosie Lukes, MD Taking Active   ?Cranberry 500 MG TABS [id number] Yes Take 1 tablet by mouth daily. [provider] Taking Active   ?hydrochlorothiazide (HYDRODIURIL) 25 MG tablet [id number] No TAKE 1/2 TABLET( 12.5 MG TOTAL) BY MOUTH 3 TIMES PER WEEK  ?Patient not taking: Reported on 05/16/2021  ? Mosie Lukes, MD Not Taking Active   ?Multiple Vitamins-Minerals (HAIR/SKIN/NAILS/BIOTIN PO)  [id number] Yes Take 500 mg by mouth daily. [provider] Taking Active Self  ?potassium chloride SA (KLOR-CON) 20 MEQ tablet [id number] No Take 20 mEq by mouth every Monday, Wednesday, and Friday

## 2021-05-19 ENCOUNTER — Ambulatory Visit
Admission: RE | Admit: 2021-05-19 | Discharge: 2021-05-19 | Disposition: A | Discharge status: Home / Self Care | Payer: Medicare HMO | Source: Ambulatory Visit | Attending: Family Medicine | Admitting: Family Medicine

## 2021-05-19 DIAGNOSIS — Z1231 Encounter for screening mammogram for malignant neoplasm of breast: Secondary | ICD-10-CM

## 2021-05-20 ENCOUNTER — Other Ambulatory Visit: Payer: Medicare HMO

## 2021-05-20 NOTE — Chronic Care Management (AMB) (Signed)
05/20/2021 Addendum:  ?Mosie Lukes, MD  Cherre Robins, RPH-CPP ?Thanks, we can offer her a pulmonology consultation if she is interested and/or has any further exacerbations.  ? ?Called patient and offered pulmonology consult - she declined at this time. ?

## 2021-05-22 DIAGNOSIS — F17210 Nicotine dependence, cigarettes, uncomplicated: Secondary | ICD-10-CM | POA: Diagnosis not present

## 2021-05-22 DIAGNOSIS — M818 Other osteoporosis without current pathological fracture: Secondary | ICD-10-CM

## 2021-05-22 DIAGNOSIS — H409 Unspecified glaucoma: Secondary | ICD-10-CM | POA: Diagnosis not present

## 2021-05-22 DIAGNOSIS — I10 Essential (primary) hypertension: Secondary | ICD-10-CM | POA: Diagnosis not present

## 2021-05-24 ENCOUNTER — Other Ambulatory Visit (INDEPENDENT_AMBULATORY_CARE_PROVIDER_SITE_OTHER): Payer: Medicare HMO

## 2021-05-24 DIAGNOSIS — E876 Hypokalemia: Secondary | ICD-10-CM | POA: Diagnosis not present

## 2021-05-24 LAB — BASIC METABOLIC PANEL
BUN: 16 mg/dL (ref 6–23)
CO2: 28 mEq/L (ref 19–32)
Calcium: 9.3 mg/dL (ref 8.4–10.5)
Chloride: 101 mEq/L (ref 96–112)
Creatinine, Ser: 1.09 mg/dL (ref 0.40–1.20)
GFR: 49.5 mL/min — ABNORMAL LOW (ref >60.00)
Glucose, Bld: 86 mg/dL (ref 70–99)
Potassium: 4.3 mEq/L (ref 3.5–5.1)
Sodium: 134 mEq/L — ABNORMAL LOW (ref 135–145)

## 2021-05-31 DIAGNOSIS — H25041 Posterior subcapsular polar age-related cataract, right eye: Secondary | ICD-10-CM | POA: Diagnosis not present

## 2021-05-31 DIAGNOSIS — H2511 Age-related nuclear cataract, right eye: Secondary | ICD-10-CM | POA: Diagnosis not present

## 2021-05-31 DIAGNOSIS — H25811 Combined forms of age-related cataract, right eye: Secondary | ICD-10-CM | POA: Diagnosis not present

## 2021-06-15 DIAGNOSIS — H34831 Tributary (branch) retinal vein occlusion, right eye, with macular edema: Secondary | ICD-10-CM | POA: Diagnosis not present

## 2021-07-15 DIAGNOSIS — Z961 Presence of intraocular lens: Secondary | ICD-10-CM | POA: Diagnosis not present

## 2021-07-21 ENCOUNTER — Ambulatory Visit (INDEPENDENT_AMBULATORY_CARE_PROVIDER_SITE_OTHER): Payer: Medicare HMO

## 2021-07-21 VITALS — BP 108/66 | HR 99 | Temp 97.6°F | Resp 16 | Ht 67.0 in | Wt 128.8 lb

## 2021-07-21 DIAGNOSIS — Z0000 Encounter for general adult medical examination without abnormal findings: Secondary | ICD-10-CM

## 2021-07-21 NOTE — Progress Notes (Signed)
Subjective:   Suzanne Turner is a 76 y.o. female who presents for Medicare Annual (Subsequent) preventive examination.  Review of Systems     Cardiac Risk Factors include: advanced age (>79mn, >>32women);hypertension;dyslipidemia     Objective:    Today's Vitals   07/21/21 1111  BP: 108/66  Pulse: 99  Resp: 16  Temp: 97.6 F (36.4 C)  SpO2: 96%  Weight: 128 lb 12.8 oz (58.4 kg)  Height: '5\' 7"'$  (1.702 m)   Body mass index is 20.17 kg/m.     07/21/2021   11:09 AM 04/17/2021    5:15 PM 07/15/2020    3:05 PM 07/07/2019    2:37 PM 07/05/2018    1:14 PM 07/03/2017    1:22 PM 06/29/2016   11:50 AM  Advanced Directives  Does Patient Have a Medical Advance Directive? No No Yes Yes Yes Yes Yes  Type of AComptrollerLiving will HPlatterLiving will HWorthvilleLiving will HPort CharlotteLiving will HAstat[place]Living will  Does patient want to make changes to medical advance directive?    No - Patient declined No - Patient declined    Copy of [place] Chart?   Yes - validated most recent copy scanned in chart (See row information) Yes - validated most recent copy scanned in chart (See row information) Yes - validated most recent copy scanned in chart (See row information) No - copy requested No - copy requested  Would patient like information on creating a medical advance directive? No - Patient declined No - Patient declined         Current Medications (verified) Outpatient Encounter Medications as of 07/21/2021  Medication Sig   acetaminophen (TYLENOL) 325 MG tablet Take 325 mg by mouth daily as needed for pain.   aspirin EC 81 MG tablet Take 81 mg by mouth daily.   budesonide-formoterol (SYMBICORT) 80-4.5 MCG/ACT inhaler Inhale 2 puffs into the lungs 2 (two) times daily.   carvedilol (COREG) 3.125 MG tablet TAKE 1 TABLET(3.125 MG) BY MOUTH TWICE DAILY WITH  A MEAL   Cranberry 500 MG TABS Take 1 tablet by mouth daily.   Multiple Vitamins-Minerals (HAIR/SKIN/NAILS/BIOTIN PO) Take 500 mg by mouth daily.   triamcinolone (KENALOG) 0.1 % Apply 1 application topically daily as needed (affected areas).   VYZULTA 0.024 % SOLN Place 1 drop into both eyes at bedtime.   [DISCONTINUED] hydrochlorothiazide (HYDRODIURIL) 25 MG tablet TAKE 1/2 TABLET( 12.5 MG TOTAL) BY MOUTH 3 TIMES PER WEEK (Patient not taking: Reported on 05/16/2021)   [DISCONTINUED] potassium chloride SA (KLOR-CON) 20 MEQ tablet Take 20 mEq by mouth every Monday, Wednesday, and Friday. (Patient not taking: Reported on 04/15/2021)   No facility-administered encounter medications on file as of 07/21/2021.    Allergies (verified) Medrol [methylprednisolone], Alphagan [brimonidine], Rhopressa [netarsudil dimesylate], Zioptan [tafluprost (pf)], Amlodipine, Lisinopril, Losartan, Penicillins, Sulfonamide derivatives, and Timolol   History: Past Medical History:  Diagnosis Date   Abnormal thyroid blood test 01/28/2016   Bronchitis    Cataract    Chronic eczema    Glaucoma    Left eye   Glaucoma 07/28/2014   [facility]  H/O measles    H/O mumps    High cholesterol    no medications   History of chicken pox    Hypercalcemia 08/02/2014   Hypertension    Kidney stone  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;  Service: ENT;  Laterality:  Right;   SALIVARY STONE REMOVAL N/A 11/18/2012   Procedure: INTRAORAL EXCISION SUBMANDIBULAR STONE RIGHT ;  Surgeon: Izora Gala, MD;  Location: [place];  Service: ENT;  Laterality: N/A;   SKIN BIOPSY Right 12/2012   legs   TONSILLECTOMY  1949   TUBAL LIGATION  1977   Family History  Problem Relation Age of Onset   Arrhythmia Mother    Colon cancer Mother 65   Cancer Mother    Tremor Mother    Arrhythmia Maternal Grandmother    Glaucoma Maternal Grandmother    Breast cancer Sister    Cancer Sister        breast   Glaucoma Sister    Heart disease Father 50   Arthritis Father        broken back with chronic pain   Glaucoma Father    Heart disease Brother    Arrhythmia Brother    Tremor Brother    Hypertension Daughter    Arrhythmia Maternal Uncle    Heart disease Maternal Uncle    Colon polyps Maternal Aunt    Social History   Socioeconomic History   Marital status: Widowed    Spouse name: Not on file   Number of children: Not on file   Years of education: Not on file   Highest education level: Not on file  Occupational History   Not on file  Tobacco Use   Smoking status: Every Day    Packs/day: 0.50    Years: 40.00    Total pack years: 20.00    Types: Cigarettes   Smokeless tobacco: Never  Substance and Sexual Activity   Alcohol use: No   Drug use: No   Sexual activity: Not Currently    Comment: lives alone, widowed, retired from [employer], education. and real estate, no dietary restrictions  Other Topics Concern   Not on file  Social History Narrative   Not on file   Social Determinants of Health   Financial Resource Strain: Medium Risk (02/01/2021)   Overall Financial Resource Strain (CARDIA)    Difficulty of Paying Living Expenses: Somewhat hard  Food Insecurity: No Food Insecurity (07/15/2020)   Hunger Vital Sign    Worried About Running Out of Food in the Last Year: Never true    Ran Out of Food in the Last Year: Never  true  Transportation Needs: No Transportation Needs (07/15/2020)   PRAPARE - Hydrologist (Medical): No    Lack of Transportation (Non-Medical): No  Physical Activity: Sufficiently Active (07/15/2020)   Exercise Vital Sign    Days of Exercise per Week: 5 days    Minutes of Exercise per Session: 60 min  Stress: No Stress Concern Present (07/15/2020)   [place]  Feeling of Stress : Not at all  Social Connections: Socially Isolated (07/15/2020)   Social Connection and Isolation Panel [NHANES]    Frequency of Communication with Friends and Family: More than three times a week    Frequency of Social Gatherings with Friends and Family: More than three times a week    Attends Religious Services: Never    Marine scientist or Organizations: No    Attends Archivist Meetings: Never    Marital Status: Widowed    Tobacco Counseling Ready to quit: Not Answered Counseling given: Not Answered   Clinical Intake:  Pre-visit preparation completed: Yes  Pain : No/denies pain     BMI - recorded: 20.17 Nutritional Status: BMI of 19-24  Normal Diabetes: No  How often do you need to have someone help you when you read instructions, pamphlets, or other written materials from your doctor or pharmacy?: 1 - Never  Diabetic?No  Interpreter Needed?: No  Information entered by :: [place] of Daily Living    07/21/2021   11:15 AM  In your present state of health, do you have any difficulty performing the following activities:  Hearing? 0  Vision? 1  Difficulty concentrating or making decisions? 0  Walking or climbing stairs? 0  Dressing or bathing? 0  Doing errands, shopping? 0  Preparing Food and eating ? N  Using the Toilet? N  In the past six months, have you accidently leaked urine? Y  Do you have problems with loss of bowel control? Y  Managing your  Medications? N  Managing your Finances? N  Housekeeping or managing your Housekeeping? N    Patient Care Team: Mosie Lukes, MD as PCP - General (Family Medicine) Larey Dresser, MD as Consulting Physician (Cardiology) Shon Hough, MD as Consulting Physician (Ophthalmology) Sherrlyn Hock. Gordy Levan, DDS as Consulting Physician (Dentistry) Cherre Robins, RPH-CPP (Pharmacist)  Indicate any recent Medical Services you may have received from other than [facility] providers in the past year (date may be approximate).     Assessment:   This is a routine wellness examination for Denee.  Hearing/Vision screen No results found.  Dietary issues and exercise activities discussed: Current Exercise Habits: Home exercise routine, Type of exercise: yoga, Time (Minutes): 60, Frequency (Times/Week): 2, Weekly Exercise (Minutes/Week): 120, Intensity: Mild, Exercise limited by: None identified   Goals Addressed   None    Depression Screen    07/21/2021   11:11 AM 04/15/2021    1:01 PM 11/09/2020   11:18 AM 07/15/2020    3:07 PM 07/07/2019    2:43 PM 07/05/2018    1:16 PM 07/03/2017    1:23 PM  PHQ 2/9 Scores  PHQ - 2 Score 0 0 4 1 0 0 0  PHQ- 9 Score   8        Fall Risk    07/21/2021   11:09 AM 04/15/2021    1:01 PM 07/15/2020    3:06 PM 07/07/2019    2:42 PM 07/05/2018    1:16 PM  Fall Risk   Falls in the past year? 0 0 0 1 0  Number falls in past yr: 0 0 0 0   Injury with Fall? 0 0 0 0   Risk for fall due to : No Fall Risks No Fall Risks     Follow up Falls evaluation completed Falls evaluation completed Falls prevention discussed Education provided;Falls prevention discussed     FALL RISK PREVENTION PERTAINING TO  THE HOME:  Any stairs in or around the home? Yes  If so, are there any without handrails? No  Home free of loose throw rugs in walkways, pet beds, electrical cords, etc? Yes  Adequate lighting in your home to reduce risk of falls? Yes   ASSISTIVE DEVICES UTILIZED TO  PREVENT FALLS:  Life alert? Yes  Use of a cane, walker or w/c? No  Grab bars in the bathroom? Yes  Shower chair or bench in shower? No  Elevated toilet seat or a handicapped toilet? No   TIMED UP AND GO:  Was the test performed? Yes .  Length of time to ambulate 10 feet: 10 sec.   Gait steady and fast without use of assistive device  Cognitive Function:    06/29/2016   11:51 AM  MMSE - Mini Mental State Exam  Orientation to time 4  Orientation to Place 5  Registration 3  Attention/ Calculation 4  Recall 3  Language- name 2 objects 2  Language- repeat 1  Language- follow 3 step command 3  Language- read & follow direction 1  Write a sentence 1  Copy design 1  Total score 28        07/21/2021   11:20 AM 07/15/2020    3:18 PM  6CIT Screen  What Year? 0 points 0 points  What month? 0 points 0 points  What time? 0 points 0 points  Count back from 20 0 points 0 points  Months in reverse 0 points 0 points  Repeat phrase 0 points 2 points  Total Score 0 points 2 points    Immunizations Immunization History  Administered Date(s) Administered   Fluad Quad(high Dose 65+) 10/20/2019   Influenza Split 10/23/2010   Influenza, High Dose Seasonal PF 11/13/2016, 11/13/2017   Influenza,inj,Quad PF,6+ Mos 12/11/2012   Influenza-Unspecified 11/05/2013, 11/16/2014, 10/08/2020   Moderna Covid-19 Vaccine Bivalent Booster 19yr & up 10/08/2020   Moderna SARS-COV2 Booster Vaccination 10/08/2020   PFIZER(Purple Top)SARS-COV-2 Vaccination 03/01/2019, 03/26/2019, 11/22/2019, 05/21/2020   Pneumococcal Conjugate-13 06/23/2010, 05/11/2014   Tdap 04/01/2014   Zoster Recombinat (Shingrix) 10/12/2017, 12/14/2017   Zoster, Live 07/24/2006    TDAP status: Up to date  Flu Vaccine status: Up to date  Pneumococcal vaccine status: Due, Education has been provided regarding the importance of this vaccine. Advised may receive this vaccine at local pharmacy or Health Dept. Aware to provide a  copy of the vaccination record if obtained from local pharmacy or Health Dept. Verbalized acceptance and understanding.  Covid-19 vaccine status: Completed vaccines  Qualifies for Shingles Vaccine? Yes   Zostavax completed No   Shingrix Completed?: Yes  Screening Tests Health Maintenance  Topic Date Due   Pneumonia Vaccine 76 Years old (2 - PPSV23 if available, else PCV20) 07/06/2014   COLONOSCOPY (Pts 45-48yrInsurance coverage will need to be confirmed)  08/16/2018   COVID-19 Vaccine (5 - Booster for Pfizer series) 12/03/2020   INFLUENZA VACCINE  08/23/2021   TETANUS/TDAP  03/31/2024   DEXA SCAN  Completed   Hepatitis C Screening  Completed   Zoster Vaccines- Shingrix  Completed   HPV VACCINES  Aged Out    Health Maintenance  Health Maintenance Due  Topic Date Due   Pneumonia Vaccine 6590Years old (2 - PPSV23 if available, else PCV20) 07/06/2014   COLONOSCOPY (Pts 45-4952yrnsurance coverage will need to be confirmed)  08/16/2018   COVID-19 Vaccine (5 - Booster for [place]) 12/03/2020    Colorectal cancer screening: Referral to  GI placed postpone. Pt aware the office will call re: appt.  Mammogram status: Completed 05/19/21. Repeat every year  Bone Density status: Ordered declined. Pt provided with contact info and advised to call to schedule appt.  Lung Cancer Screening: (Low Dose CT Chest recommended if Age 63-80 years, 30 pack-year currently smoking OR have quit w/in 15years.) does qualify.   Lung Cancer Screening Referral: last one 04/17/21  Additional Screening:  Hepatitis C Screening: does qualify; Completed 03/05/17  Vision Screening: Recommended annual ophthalmology exams for early detection of glaucoma and other disorders of the eye. Is the patient up to date with their annual eye exam?  Yes  Who is the provider or what is the name of the office in which the patient attends annual eye exams? Dr. Prudencio Burly If pt is not established with a provider, would they  like to be referred to a provider to establish care? No .   Dental Screening: Recommended annual dental exams for proper oral hygiene  Community Resource Referral / Chronic Care Management: CRR required this visit?  No   CCM required this visit?  No      Plan:     I have personally reviewed and noted the following in the patient's chart:   Medical and social history Use of alcohol, tobacco or illicit drugs  Current medications and supplements including opioid prescriptions.  Functional ability and status Nutritional status Physical activity Advanced directives List of other physicians Hospitalizations, surgeries, and ER visits in previous 12 months Vitals Screenings to include cognitive, depression, and falls Referrals and appointments  In addition, I have reviewed and discussed with patient certain preventive protocols, quality metrics, and best practice recommendations. A written personalized care plan for preventive services as well as general preventive health recommendations were provided to patient.     Duard Brady Birdia Jaycox, [place]   07/21/2021   Nurse Notes: none

## 2021-07-21 NOTE — Patient Instructions (Signed)
Suzanne Turner , Thank you for taking time to come for your Medicare Wellness Visit. I appreciate your ongoing commitment to your health goals. Please review the following plan we discussed and let me know if I can assist you in the future.   Screening recommendations/referrals: Colonoscopy: postpone Mammogram: 05/19/21 Bone Density: declined Recommended yearly ophthalmology/optometry visit for glaucoma screening and checkup Recommended yearly dental visit for hygiene and checkup  Vaccinations: Influenza vaccine: up to date Pneumococcal vaccine: Due-May obtain vaccine at our office or your local pharmacy.  Tdap vaccine: up to date Shingles vaccine: up to date   Covid-19:completed  Advanced directives: yes, on file  Conditions/risks identified: see problem list   Next appointment: Follow up in one year for your annual wellness visit    Preventive Care 76 Years and Older, Female Preventive care refers to lifestyle choices and visits with your health care provider that can promote health and wellness. What does preventive care include? A yearly physical exam. This is also called an annual well check. Dental exams once or twice a year. Routine eye exams. Ask your health care provider how often you should have your eyes checked. Personal lifestyle choices, including: Daily care of your teeth and gums. Regular physical activity. Eating a healthy diet. Avoiding tobacco and drug use. Limiting alcohol use. Practicing safe sex. Taking low-dose aspirin every day. Taking vitamin and mineral supplements as recommended by your health care provider. What happens during an annual well check? The services and screenings done by your health care provider during your annual well check will depend on your age, overall health, lifestyle risk factors, and family history of disease. Counseling  Your health care provider may ask you questions about your: Alcohol use. Tobacco use. Drug use. Emotional  well-being. Home and relationship well-being. Sexual activity. Eating habits. History of falls. Memory and ability to understand (cognition). Work and work Statistician. Reproductive health. Screening  You may have the following tests or measurements: Height, weight, and BMI. Blood pressure. Lipid and cholesterol levels. These may be checked every 5 years, or more frequently if you are over 35 years old. Skin check. Lung cancer screening. You may have this screening every year starting at age 76 if you have a 30-pack-year history of smoking and currently smoke or have quit within the past 15 years. Fecal occult blood test (FOBT) of the stool. You may have this test every year starting at age 71. Flexible sigmoidoscopy or colonoscopy. You may have a sigmoidoscopy every 5 years or a colonoscopy every 10 years starting at age 76. Hepatitis C blood test. Hepatitis B blood test. Sexually transmitted disease (STD) testing. Diabetes screening. This is done by checking your blood sugar (glucose) after you have not eaten for a while (fasting). You may have this done every 1-3 years. Bone density scan. This is done to screen for osteoporosis. You may have this done starting at age 76. Mammogram. This may be done every 1-2 years. Talk to your health care provider about how often you should have regular mammograms. Talk with your health care provider about your test results, treatment options, and if necessary, the need for more tests. Vaccines  Your health care provider may recommend certain vaccines, such as: Influenza vaccine. This is recommended every year. Tetanus, diphtheria, and acellular pertussis (Tdap, Td) vaccine. You may need a Td booster every 10 years. Zoster vaccine. You may need this after age 76. Pneumococcal 13-valent conjugate (PCV13) vaccine. One dose is recommended after age 76. Pneumococcal polysaccharide (  PPSV23) vaccine. One dose is recommended after age 76. Talk to your  health care provider about which screenings and vaccines you need and how often you need them. This information is not intended to replace advice given to you by your health care provider. Make sure you discuss any questions you have with your health care provider. Document Released: 02/05/2015 Document Revised: 09/29/2015 Document Reviewed: 11/10/2014 Elsevier Interactive Patient Education  2017 [place] Prevention in the Home Falls can cause injuries. They can happen to people of all ages. There are many things you can do to make your home safe and to help prevent falls. What can I do on the outside of my home? Regularly fix the edges of walkways and driveways and fix any cracks. Remove anything that might make you trip as you walk through a door, such as a raised step or threshold. Trim any bushes or trees on the path to your home. Use bright outdoor lighting. Clear any walking paths of anything that might make someone trip, such as rocks or tools. Regularly check to see if handrails are loose or broken. Make sure that both sides of any steps have handrails. Any raised decks and porches should have guardrails on the edges. Have any leaves, snow, or ice cleared regularly. Use sand or salt on walking paths during winter. Clean up any spills in your garage right away. This includes oil or grease spills. What can I do in the bathroom? Use night lights. Install grab bars by the toilet and in the tub and shower. Do not use towel bars as grab bars. Use non-skid mats or decals in the tub or shower. If you need to sit down in the shower, use a plastic, non-slip stool. Keep the floor dry. Clean up any water that spills on the floor as soon as it happens. Remove soap buildup in the tub or shower regularly. Attach bath mats securely with double-sided non-slip rug tape. Do not have throw rugs and other things on the floor that can make you trip. What can I do in the bedroom? Use night  lights. Make sure that you have a light by your bed that is easy to reach. Do not use any sheets or blankets that are too big for your bed. They should not hang down onto the floor. Have a firm chair that has side arms. You can use this for support while you get dressed. Do not have throw rugs and other things on the floor that can make you trip. What can I do in the kitchen? Clean up any spills right away. Avoid walking on wet floors. Keep items that you use a lot in easy-to-reach places. If you need to reach something above you, use a strong step stool that has a grab bar. Keep electrical cords out of the way. Do not use floor polish or wax that makes floors slippery. If you must use wax, use non-skid floor wax. Do not have throw rugs and other things on the floor that can make you trip. What can I do with my stairs? Do not leave any items on the stairs. Make sure that there are handrails on both sides of the stairs and use them. Fix handrails that are broken or loose. Make sure that handrails are as long as the stairways. Check any carpeting to make sure that it is firmly attached to the stairs. Fix any carpet that is loose or worn. Avoid having throw rugs at the top or  bottom of the stairs. If you do have throw rugs, attach them to the floor with carpet tape. Make sure that you have a light switch at the top of the stairs and the bottom of the stairs. If you do not have them, ask someone to add them for you. What else can I do to help prevent falls? Wear shoes that: Do not have high heels. Have rubber bottoms. Are comfortable and fit you well. Are closed at the toe. Do not wear sandals. If you use a stepladder: Make sure that it is fully opened. Do not climb a closed stepladder. Make sure that both sides of the stepladder are locked into place. Ask someone to hold it for you, if possible. Clearly mark and make sure that you can see: Any grab bars or handrails. First and last  steps. Where the edge of each step is. Use tools that help you move around (mobility aids) if they are needed. These include: Canes. Walkers. Scooters. Crutches. Turn on the lights when you go into a dark area. Replace any light bulbs as soon as they burn out. Set up your furniture so you have a clear path. Avoid moving your furniture around. If any of your floors are uneven, fix them. If there are any pets around you, be aware of where they are. Review your medicines with your doctor. Some medicines can make you feel dizzy. This can increase your chance of falling. Ask your doctor what other things that you can do to help prevent falls. This information is not intended to replace advice given to you by your health care provider. Make sure you discuss any questions you have with your health care provider. Document Released: 11/05/2008 Document Revised: 06/17/2015 Document Reviewed: 02/13/2014 Elsevier Interactive Patient Education  2017 [employer].

## 2021-08-01 NOTE — Progress Notes (Unsigned)
Subjective:    Patient ID: Suzanne Turner, female    DOB: [birth year], 76 y.o.   MRN: [id number]  No chief complaint on file.   HPI Patient is in today for a follow up.  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   Left eye Dr Shon Hough at [facility]    Osteoarthritis    Osteopenia    Preventative health care 01/30/2016   Salivary duct stone    Skin cancer    Wears glasses     Past Surgical History:  Procedure Laterality Date   CATARACT EXTRACTION Bilateral    May 31, 2021 and March 2023   ORIF TIBIA & FIBULA FRACTURES Right 1966   REFRACTIVE SURGERY Left March/April 2018   SALIVARY STONE REMOVAL Right 05/15/2012   Procedure: EXCISION OF RIGHT SUBMANDIBULAR GLAND ;  Surgeon: Izora Gala, MD;  Location: [facility] OR;  Service: ENT;  Laterality: Right;   SALIVARY STONE REMOVAL N/A 11/18/2012   Procedure: INTRAORAL EXCISION SUBMANDIBULAR STONE RIGHT ;  Surgeon: Izora Gala, MD;  Location: [place];  Service: ENT;  Laterality: N/A;   SKIN BIOPSY Right 12/2012   legs   TONSILLECTOMY  1949   TUBAL LIGATION  1977    Family History  Problem Relation Age of Onset   Arrhythmia Mother    Colon cancer Mother 109   Cancer Mother    Tremor Mother    Arrhythmia Maternal Grandmother    Glaucoma Maternal Grandmother    Breast cancer Sister    Cancer Sister        breast   Glaucoma Sister    Heart disease Father 76   Arthritis Father        broken back with chronic pain   Glaucoma Father    Heart disease Brother    Arrhythmia Brother    Tremor Brother    Hypertension Daughter     Arrhythmia Maternal Uncle    Heart disease Maternal Uncle    Colon polyps Maternal Aunt     Social History   Socioeconomic History   Marital status: Widowed    Spouse name: Not on file   Number of children: Not on file   Years of education: Not on file   Highest education level: Not on file  Occupational History   Not on file  Tobacco Use   Smoking status: Every Day    Packs/day: 0.50    Years: 40.00    Total pack years: 20.00    Types: Cigarettes   Smokeless tobacco: Never  Substance and Sexual Activity   Alcohol use: No   Drug use: No   Sexual activity: Not Currently    Comment: lives alone, widowed, retired from Actuary, education. and real estate, no dietary restrictions  Other Topics Concern   Not on file  Social History Narrative   Not on file   Social Determinants of Health   Financial Resource Strain: Medium Risk (02/01/2021)   Overall Financial Resource Strain (  CARDIA)    Difficulty of Paying Living Expenses: Somewhat hard  Food Insecurity: No Food Insecurity (07/15/2020)   Hunger Vital Sign    Worried About Running Out of Food in the Last Year: Never true    Ran Out of Food in the Last Year: Never true  Transportation Needs: No Transportation Needs (07/15/2020)   PRAPARE - Hydrologist (Medical): No    Lack of Transportation (Non-Medical): No  Physical Activity: Sufficiently Active (07/15/2020)   Exercise Vital Sign    Days of Exercise per Week: 5 days    Minutes of Exercise per Session: 60 min  Stress: No Stress Concern Present (07/15/2020)   [place]    Feeling of Stress : Not at all  Social Connections: Socially Isolated (07/15/2020)   Social Connection and Isolation Panel [NHANES]    Frequency of Communication with Friends and Family: More than three times a week    Frequency of Social Gatherings with Friends and Family: More than three times a week     Attends Religious Services: Never    Marine scientist or Organizations: No    Attends Archivist Meetings: Never    Marital Status: Widowed  Intimate Partner Violence: Not At Risk (07/15/2020)   Humiliation, Afraid, Rape, and Kick questionnaire    Fear of Current or Ex-Partner: No    Emotionally Abused: No    Physically Abused: No    Sexually Abused: No    Outpatient Medications Prior to Visit  Medication Sig Dispense Refill   acetaminophen (TYLENOL) 325 MG tablet Take 325 mg by mouth daily as needed for pain.     aspirin EC 81 MG tablet Take 81 mg by mouth daily.     budesonide-formoterol (SYMBICORT) 80-4.5 MCG/ACT inhaler Inhale 2 puffs into the lungs 2 (two) times daily. 1 each 12   carvedilol (COREG) 3.125 MG tablet TAKE 1 TABLET(3.125 MG) BY MOUTH TWICE DAILY WITH A MEAL 60 tablet 5   Cranberry 500 MG TABS Take 1 tablet by mouth daily.     Multiple Vitamins-Minerals (HAIR/SKIN/NAILS/BIOTIN PO) Take 500 mg by mouth daily.     triamcinolone (KENALOG) 0.1 % Apply 1 application topically daily as needed (affected areas).     VYZULTA 0.024 % SOLN Place 1 drop into both eyes at bedtime.     No facility-administered medications prior to visit.    Allergies  Allergen Reactions   Medrol [Methylprednisolone] Swelling and Other (See Comments)    Facial and lip swelling, but no breathing issues   Alphagan [Brimonidine] Other (See Comments)    Eye irritation / eye fatigue   Rhopressa [Netarsudil Dimesylate] Itching   Zioptan [Tafluprost (Pf)] Other (See Comments)    Eye irritation   Amlodipine Other (See Comments)    Hip,legs,and feet feels achy and stiff   Lisinopril Rash   Losartan Anxiety   Penicillins Rash   Sulfonamide Derivatives Rash   Timolol Rash    ROS     Objective:    Physical Exam  There were no vitals taken for this visit. Wt Readings from Last 3 Encounters:  07/21/21 128 lb 12.8 oz (58.4 kg)  04/15/21 128 lb (58.1 kg)  11/09/20 127 lb  9.6 oz (57.9 kg)    Diabetic Foot Exam - Simple   No data filed    Lab Results  Component Value Date   WBC 9.0 04/17/2021   HGB 12.8 04/17/2021  HCT 36.9 04/17/2021   PLT 147 (L) 04/17/2021   GLUCOSE 86 05/24/2021   CHOL 186 11/09/2020   TRIG 100.0 11/09/2020   HDL 65.70 11/09/2020   LDLCALC 101 (H) 11/09/2020   ALT 14 11/09/2020   AST 22 11/09/2020   NA 134 (L) 05/24/2021   K 4.3 05/24/2021   CL 101 05/24/2021   CREATININE 1.09 05/24/2021   BUN 16 05/24/2021   CO2 28 05/24/2021   TSH 2.34 11/09/2020   INR 1.0 03/22/2020   HGBA1C 5.6 11/09/2020    Lab Results  Component Value Date   TSH 2.34 11/09/2020   Lab Results  Component Value Date   WBC 9.0 04/17/2021   HGB 12.8 04/17/2021   HCT 36.9 04/17/2021   MCV 89.6 04/17/2021   PLT 147 (L) 04/17/2021   Lab Results  Component Value Date   NA 134 (L) 05/24/2021   K 4.3 05/24/2021   CO2 28 05/24/2021   GLUCOSE 86 05/24/2021   BUN 16 05/24/2021   CREATININE 1.09 05/24/2021   BILITOT 0.6 11/09/2020   ALKPHOS 63 11/09/2020   AST 22 11/09/2020   ALT 14 11/09/2020   PROT 7.3 11/09/2020   ALBUMIN 4.3 11/09/2020   CALCIUM 9.3 05/24/2021   ANIONGAP 11 04/17/2021   GFR 49.50 (L) 05/24/2021   Lab Results  Component Value Date   CHOL 186 11/09/2020   Lab Results  Component Value Date   HDL 65.70 11/09/2020   Lab Results  Component Value Date   LDLCALC 101 (H) 11/09/2020   Lab Results  Component Value Date   TRIG 100.0 11/09/2020   Lab Results  Component Value Date   CHOLHDL 3 11/09/2020   Lab Results  Component Value Date   HGBA1C 5.6 11/09/2020       Assessment & Plan:      Problem List Items Addressed This Visit   None   I am having Suzanne Turner maintain her aspirin EC, Multiple Vitamins-Minerals (HAIR/SKIN/NAILS/BIOTIN PO), acetaminophen, triamcinolone cream, Vyzulta, carvedilol, budesonide-formoterol, and Cranberry.  No orders of the defined types were placed in this  encounter.

## 2021-08-02 ENCOUNTER — Ambulatory Visit (INDEPENDENT_AMBULATORY_CARE_PROVIDER_SITE_OTHER): Payer: Medicare HMO | Admitting: Family Medicine

## 2021-08-02 ENCOUNTER — Encounter: Payer: Self-pay | Admitting: Family Medicine

## 2021-08-02 VITALS — BP 134/70 | HR 54 | Resp 20 | Ht 67.0 in | Wt 130.0 lb

## 2021-08-02 DIAGNOSIS — J449 Chronic obstructive pulmonary disease, unspecified: Secondary | ICD-10-CM | POA: Diagnosis not present

## 2021-08-02 DIAGNOSIS — R7989 Other specified abnormal findings of blood chemistry: Secondary | ICD-10-CM

## 2021-08-02 DIAGNOSIS — F17201 Nicotine dependence, unspecified, in remission: Secondary | ICD-10-CM | POA: Insufficient documentation

## 2021-08-02 DIAGNOSIS — E559 Vitamin D deficiency, unspecified: Secondary | ICD-10-CM

## 2021-08-02 DIAGNOSIS — I10 Essential (primary) hypertension: Secondary | ICD-10-CM | POA: Diagnosis not present

## 2021-08-02 DIAGNOSIS — E782 Mixed hyperlipidemia: Secondary | ICD-10-CM | POA: Diagnosis not present

## 2021-08-02 DIAGNOSIS — Z87440 Personal history of urinary (tract) infections: Secondary | ICD-10-CM

## 2021-08-02 DIAGNOSIS — M810 Age-related osteoporosis without current pathological fracture: Secondary | ICD-10-CM | POA: Diagnosis not present

## 2021-08-02 DIAGNOSIS — K5900 Constipation, unspecified: Secondary | ICD-10-CM | POA: Diagnosis not present

## 2021-08-02 LAB — LIPID PANEL
Cholesterol: 208 mg/dL — ABNORMAL HIGH (ref 0–200)
HDL: 65.7 mg/dL (ref >39.00)
LDL Cholesterol: 118 mg/dL — ABNORMAL HIGH (ref 0–99)
NonHDL: 141.84
Total CHOL/HDL Ratio: 3
Triglycerides: 118 mg/dL (ref 0.0–149.0)
VLDL: 23.6 mg/dL (ref 0.0–40.0)

## 2021-08-02 LAB — COMPREHENSIVE METABOLIC PANEL
ALT: 12 U/L (ref 0–35)
AST: 22 U/L (ref 0–37)
Albumin: 4.5 g/dL (ref 3.5–5.2)
Alkaline Phosphatase: 58 U/L (ref 39–117)
BUN: 16 mg/dL (ref 6–23)
CO2: 27 mEq/L (ref 19–32)
Calcium: 9.5 mg/dL (ref 8.4–10.5)
Chloride: 101 mEq/L (ref 96–112)
Creatinine, Ser: 0.82 mg/dL (ref 0.40–1.20)
GFR: 69.57 mL/min (ref >60.00)
Glucose, Bld: 78 mg/dL (ref 70–99)
Potassium: 3.9 mEq/L (ref 3.5–5.1)
Sodium: 135 mEq/L (ref 135–145)
Total Bilirubin: 0.8 mg/dL (ref 0.2–1.2)
Total Protein: 7.4 g/dL (ref 6.0–8.3)

## 2021-08-02 LAB — VITAMIN D 25 HYDROXY (VIT D DEFICIENCY, FRACTURES): VITD: 36.29 ng/mL (ref 30.00–100.00)

## 2021-08-02 LAB — CBC
HCT: 36.9 % (ref 36.0–46.0)
Hemoglobin: 12.6 g/dL (ref 12.0–15.0)
MCHC: 34.1 g/dL (ref 30.0–36.0)
MCV: 92.8 fl (ref 78.0–100.0)
Platelets: 187 10*3/uL (ref 150.0–400.0)
RBC: 3.97 Mil/uL (ref 3.87–5.11)
RDW: 13.3 % (ref 11.5–15.5)
WBC: 4.9 10*3/uL (ref 4.0–10.5)

## 2021-08-02 LAB — HEMOGLOBIN A1C: Hgb A1c MFr Bld: 5.7 % (ref 4.6–6.5)

## 2021-08-02 LAB — TSH: TSH: 2.77 u[IU]/mL (ref 0.35–5.50)

## 2021-08-02 NOTE — Assessment & Plan Note (Signed)
She is using nicotine lozenges 7-8 a day. Not smoking now she declines low dose CT scan or referral to pulmonary

## 2021-08-02 NOTE — Assessment & Plan Note (Signed)
Supplement and monitor 

## 2021-08-02 NOTE — Assessment & Plan Note (Signed)
Encourage heart healthy diet such as MIND or DASH diet, increase exercise, avoid trans fats, simple carbohydrates and processed foods, consider a krill or fish or flaxseed oil cap daily.  °

## 2021-08-02 NOTE — Assessment & Plan Note (Signed)
Take Miralax with Benefiber daily

## 2021-08-02 NOTE — Assessment & Plan Note (Signed)
Quit in March after a COPD exacerbation flared by RSV. She is using nicotine lozenges 7-8 a day. Still has cravings but she has fully avoided cigarettes since her hospitalizations. Her breathing has been much better.

## 2021-08-02 NOTE — Assessment & Plan Note (Signed)
Well controlled, no changes to meds. Encouraged heart healthy diet such as the DASH diet and exercise as tolerated.  °

## 2021-08-02 NOTE — Assessment & Plan Note (Signed)
Has done better on cranberry tabs but she had some diarrhea so she stopped it and the diarrhea got better.

## 2021-08-02 NOTE — Patient Instructions (Addendum)
AZO cranberry tabs with probiotic and plenty of water (60-80 ounces)  Mix Miralax with Benefiber daily til constipation resolves   Nicotine Lozenge What is this medication? NICOTINE ([place] teen) helps you quit smoking. It reduces cravings for nicotine, the addictive substance found in tobacco. It may also help reduce symptoms of withdrawal. It is most effective when used in combination with a stop-smoking program. This medicine may be used for other purposes; ask your health care provider or pharmacist if you have questions. COMMON BRAND NAME(S): Commit, NICOrelief, Nicorette What should I tell my care team before I take this medication? They need to know if you have any of these conditions: Diabetes Heart disease, angina, irregular heartbeat or previous heart attack High blood pressure Lung disease, including asthma Overactive thyroid Pheochromocytoma Seizures or history of seizures Stomach problems or ulcers An unusual or allergic reaction to nicotine, other medications, foods, dyes, or preservatives Pregnant or trying to get pregnant Breast-feeding How should I use this medication? Place the lozenge in your mouth and allow it to slowly dissolve. Move the lozenge from one side of your mouth to the other every so often. Do not chew or swallow the lozenge. Follow the directions on the product label. Use exactly as directed. Do not use more often than directed. This medication comes with INSTRUCTIONS FOR USE. Ask your pharmacist for directions on how to use this medication. Read the information carefully. Talk to your pharmacist or care team if you have questions. Talk to your care team about the use of this medication in children. Special care may be needed. Overdosage: If you think you have taken too much of this medicine contact a poison control center or emergency room at once. NOTE: This medicine is only for you. Do not share this medicine with others. What if I miss a dose? This  does not apply. What may interact with this medication? Certain medications for lung or breathing disease, like asthma Medications for blood pressure Medications for depression This list may not describe all possible interactions. Give your health care provider a list of all the medicines, herbs, non-prescription drugs, or dietary supplements you use. Also tell them if you smoke, drink alcohol, or use illegal drugs. Some items may interact with your medicine. What should I watch for while using this medication? Visit your care team for regular checks on your progress. Talk to your care team about what you can do to improve your chances of quitting. What side effects may I notice from receiving this medication? Side effects that you should report to your care team as soon as possible: Allergic reactions--skin rash, itching, hives, swelling of the face, lips, tongue, or throat Heart palpitations--rapid, pounding, or irregular heartbeat Increase in blood pressure Side effects that usually do not require medical attention (report to your care team if they continue or are bothersome): Dizziness Headache Heartburn Hiccups Irritation inside the mouth or throat Trouble sleeping This list may not describe all possible side effects. Call your doctor for medical advice about side effects. You may report side effects to FDA at 1-800-FDA-1088. Where should I keep my medication? This product may have enough nicotine in it to make children and pets sick. Keep it away from children and pets. After using, throw away as directed on the package. Store at room temperature between 15 and 30 degrees C (59 and 86 degrees F). Protect from heat and light. Throw away unused medication after the expiration date. NOTE: This sheet is a summary. It  may not cover all possible information. If you have questions about this medicine, talk to your doctor, pharmacist, or health care provider.  2023 Elsevier/Gold Standard  (2020-12-10 00:00:00)

## 2021-08-10 DIAGNOSIS — H34831 Tributary (branch) retinal vein occlusion, right eye, with macular edema: Secondary | ICD-10-CM | POA: Diagnosis not present

## 2021-08-31 ENCOUNTER — Other Ambulatory Visit: Payer: Self-pay

## 2021-08-31 MED ORDER — CARVEDILOL 3.125 MG PO TABS: ORAL_TABLET | ORAL | 5 refills | Status: DC

## 2021-09-12 DIAGNOSIS — H43811 Vitreous degeneration, right eye: Secondary | ICD-10-CM | POA: Diagnosis not present

## 2021-09-12 DIAGNOSIS — H3581 Retinal edema: Secondary | ICD-10-CM | POA: Diagnosis not present

## 2021-09-12 DIAGNOSIS — H34831 Tributary (branch) retinal vein occlusion, right eye, with macular edema: Secondary | ICD-10-CM | POA: Diagnosis not present

## 2021-09-12 DIAGNOSIS — H3561 Retinal hemorrhage, right eye: Secondary | ICD-10-CM | POA: Diagnosis not present

## 2021-09-12 DIAGNOSIS — Z961 Presence of intraocular lens: Secondary | ICD-10-CM | POA: Diagnosis not present

## 2021-09-12 DIAGNOSIS — H35033 Hypertensive retinopathy, bilateral: Secondary | ICD-10-CM | POA: Diagnosis not present

## 2021-09-30 ENCOUNTER — Encounter: Payer: Self-pay | Admitting: Family Medicine

## 2021-10-05 DIAGNOSIS — H34831 Tributary (branch) retinal vein occlusion, right eye, with macular edema: Secondary | ICD-10-CM | POA: Diagnosis not present

## 2021-11-01 ENCOUNTER — Encounter: Payer: Self-pay | Admitting: Family Medicine

## 2021-11-15 ENCOUNTER — Ambulatory Visit: Payer: Medicare HMO | Admitting: Pharmacist

## 2021-11-15 DIAGNOSIS — I10 Essential (primary) hypertension: Secondary | ICD-10-CM

## 2021-11-15 DIAGNOSIS — F17201 Nicotine dependence, unspecified, in remission: Secondary | ICD-10-CM

## 2021-11-15 DIAGNOSIS — E782 Mixed hyperlipidemia: Secondary | ICD-10-CM

## 2021-11-15 DIAGNOSIS — J449 Chronic obstructive pulmonary disease, unspecified: Secondary | ICD-10-CM

## 2021-11-15 NOTE — Progress Notes (Signed)
Pharmacy Note  11/15/2021 Name: Suzanne Turner MRN: [id number] DOB: 1945/12/14  Subjective: Suzanne Turner is a 76 y.o. year old female who is a primary care patient of Mosie Lukes, MD. Clinical Pharmacist Practitioner referral was placed to assist with medication management  Engaged with patient by telephone for follow up visit today.  Medication Management: Patient previously was having difficulty with finances. Copay for Vyzulta high $95 per month. In January 2023 requested tier exception from [facility] again with additional information on past therapies from Dr Prudencio Burly office. Was approved 02/28/2021 and monthly copay decreased from $95/month to $47/month. Copay exception is approve thru 01/22/2022 COPD: Seen in ED March 2023 for COPD exacerbation but no prior diagnosis of COPD. Patient was prescribed Symbicort 73mg 2 puffs twice a day in ED but she reports was instructed to use as a rescue when she has difficulty breathing.Patient has not used Symbicort. Denies shortness of breath.  Smoking Cessation: Has not smoked since 03/24/203 - smoke free for 7 months! She does has some cravings but uses nicotine lozenges as needed.  Blood pressure: Patient had mentioned that her blood pressure was elevated at home in MyChart message 11/01/2021. Today she reports blood pressure has improved and recently home blood pressure readings have been 120 to 138 / 78 to 88 Health Maintenance: patient reports she has received annual flu vaccine, updated COVID and RSV vaccine.  Hyperlipidemia: Last LDL was 118. Would like to see < 100. Patient states she has not been following low fat diet. Eating more ice cream since she stopped smoking.   Recent Office Visits:  08/02/2021 - PCP (Dr BCharlett Blake Seen for follow up. Labs checked. Recommended AZO Cranberry tablets with probiotic and water daily. ALso recommended Miralax + Benefiber daily until constipation resolves.    Objective: Review of patient status,  including review of consultants reports, laboratory and other test data, was performed as part of comprehensive evaluation and provision of chronic care management services.   Lab Results  Component Value Date   CREATININE 0.82 08/02/2021   CREATININE 1.09 05/24/2021   CREATININE 0.73 04/17/2021    Lab Results  Component Value Date   HGBA1C 5.7 08/02/2021       Component Value Date/Time   CHOL 208 (H) 08/02/2021 1217   TRIG 118.0 08/02/2021 1217   HDL 65.70 08/02/2021 1217   CHOLHDL 3 08/02/2021 1217   VLDL 23.6 08/02/2021 1217   LDLCALC 118 (H) 08/02/2021 1217   LDLCALC 126 (H) 10/20/2019 1006     Clinical ASCVD: No  The 10-year ASCVD risk score (Arnett DK, et al., 2019) is: 34.5%   Values used to calculate the score:     Age: [age]years     Sex: Female     Is Non-Hispanic African American: No     Diabetic: No     Tobacco smoker: Yes     Systolic Blood Pressure: 1836mmHg     Is BP treated: Yes     HDL Cholesterol: 65.7 mg/dL     Total Cholesterol: 208 mg/dL    BP Readings from Last 3 Encounters:  08/02/21 134/70  07/21/21 108/66  04/17/21 (!) 156/77     Allergies  Allergen Reactions   Medrol [Methylprednisolone] Swelling and Other (See Comments)    Facial and lip swelling, but no breathing issues   Alphagan [Brimonidine] Other (See Comments)    Eye irritation / eye fatigue   Rhopressa [Netarsudil Dimesylate] Itching   Zioptan [Tafluprost (Pf)]  Other (See Comments)    Eye irritation   Amlodipine Other (See Comments)    Hip,legs,and feet feels achy and stiff   Lisinopril Rash   Losartan Anxiety   Penicillins Rash   Sulfonamide Derivatives Rash   Timolol Rash    Medications Reviewed Today     Reviewed by Cherre Robins, RPH-CPP (Pharmacist) on 11/15/21 at 1029  Med List Status: <None>   Medication Order Taking? Sig Documenting Provider Last Dose Status Informant  acetaminophen (TYLENOL) 325 MG tablet [id number] Yes Take 325 mg by mouth daily as needed for  pain. [provider] Taking Active Self  aspirin EC 81 MG tablet [id number] Yes Take 81 mg by mouth daily. [provider] Taking Active Self  budesonide-formoterol (SYMBICORT) 80-4.5 MCG/ACT inhaler [id number] No Inhale 2 puffs into the lungs 2 (two) times daily.  Patient not taking: Reported on 01/25/7251   Lianne Cure, DO Not Taking Active            Med Note Antony Contras, [place] Nov 15, 2021 10:21 AM) Patient was told to use when needed. Has not needed recently.  Calcium Polycarbophil (FIBER-CAPS PO) [id number] Yes Take 3 capsules by mouth in the morning and at bedtime. [provider] Taking Active   carvedilol (COREG) 3.125 MG tablet [id number] Yes TAKE 1 TABLET(3.125 MG) BY MOUTH TWICE DAILY WITH A MEAL Mosie Lukes, MD Taking Active   Cranberry 500 MG TABS [id number] No Take 1 tablet by mouth daily.  Patient not taking: Reported on 11/15/2021   [provider] Not Taking Active   Multiple Vitamins-Minerals (HAIR/SKIN/NAILS/BIOTIN PO) [id number] Yes Take 500 mg by mouth daily. [provider] Taking Active Self  triamcinolone (KENALOG) 0.1 % [id number] Yes Apply 1 application topically daily as needed (affected areas). [provider] Taking Active Self  VYZULTA 0.024 % SOLN [id number] Yes Place 1 drop into both eyes at bedtime. 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   COPD (chronic obstructive pulmonary disease) ([place]) 08/02/2021   Constipation 08/02/2021   Preventative health care 11/10/2020   History of UTI 11/10/2020   Retinal vein occlusion of right eye 04/20/2020   Arthritis 10/20/2019   SCC (squamous cell carcinoma), leg, left 09/03/2017   Hyponatremia 03/05/2017   Counseling and coordination of care 01/30/2016   Abnormal thyroid blood test 01/28/2016   Other iron deficiency anemias 08/02/2014   Hypercalcemia  08/02/2014   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   History of chicken pox    Rash and nonspecific skin eruption 07/23/2013   At risk for coronary artery disease 03/05/2013   Colon polyps 04/17/2011   Tremor 04/17/2011   Hyperlipidemia 03/29/2011   Palpitations 02/07/2011   Vitamin D deficiency 05/07/2008   Essential hypertension 05/07/2008   Osteoporosis 05/07/2008     Medication Assistance:   Tier exception approved for Vyzulta thru 01/22/2022 (lowered from $95/month to $47/month.   Assessment / Plan: Medication Management:  Follow up in December 2023 or January 2024 - check 2024 formulary and request copay exception for Vyzulta if still $95 per month COPD: controlled without maintenance medication Discussed that she can use SYmbicort if needed for shortness of breath Smoking Cessation: Has not smoked since 03/24/203 - smoke free for 7 months!  Great job with quitting smoking.  Continue to use nicotine lozenges as needed.  Blood pressure:  home blood pressure has been back to normal range Continue carvedilol  Continue to check blood pressure 2 to 3 times per week.  Reviewed blood pressure goal < 140/90 Health Maintenance:  Updated vaccine records for annual flu vaccine, updated COVID and RSV vaccine.  Hyperlipidemia: Last LDL was 118. Would like to see < 100.  Discussed limiting intake of icecream and other high fat foods. Consider getting pre measure servings of ice cream so that she does not have > 1 serving at a time.  Discuss alternative snacks.   Follow Up:  Telephone follow up appointment with care management team member scheduled for:  December 2023 or January 2024 - check 2024 formulary and request copay exception for Vyzulta if still $95 per month.    Cherre Robins, PharmD Clinical Pharmacist [facility] [place] [phone]

## 2021-11-23 DIAGNOSIS — L812 Freckles: Secondary | ICD-10-CM | POA: Diagnosis not present

## 2021-11-23 DIAGNOSIS — Z85828 Personal history of other malignant neoplasm of skin: Secondary | ICD-10-CM | POA: Diagnosis not present

## 2021-11-23 DIAGNOSIS — L80 Vitiligo: Secondary | ICD-10-CM | POA: Diagnosis not present

## 2021-11-23 DIAGNOSIS — D1801 Hemangioma of skin and subcutaneous tissue: Secondary | ICD-10-CM | POA: Diagnosis not present

## 2021-11-23 DIAGNOSIS — L821 Other seborrheic keratosis: Secondary | ICD-10-CM | POA: Diagnosis not present

## 2021-11-30 DIAGNOSIS — H34831 Tributary (branch) retinal vein occlusion, right eye, with macular edema: Secondary | ICD-10-CM | POA: Diagnosis not present

## 2021-12-26 DIAGNOSIS — H532 Diplopia: Secondary | ICD-10-CM | POA: Diagnosis not present

## 2021-12-26 DIAGNOSIS — Z961 Presence of intraocular lens: Secondary | ICD-10-CM | POA: Diagnosis not present

## 2021-12-26 DIAGNOSIS — H401131 Primary open-angle glaucoma, bilateral, mild stage: Secondary | ICD-10-CM | POA: Diagnosis not present

## 2022-01-25 ENCOUNTER — Telehealth: Payer: Self-pay | Admitting: Pharmacist

## 2022-01-25 ENCOUNTER — Ambulatory Visit (INDEPENDENT_AMBULATORY_CARE_PROVIDER_SITE_OTHER): Payer: Medicare HMO | Admitting: Pharmacist

## 2022-01-25 DIAGNOSIS — H4010X0 Unspecified open-angle glaucoma, stage unspecified: Secondary | ICD-10-CM

## 2022-01-25 DIAGNOSIS — Z79899 Other long term (current) drug therapy: Secondary | ICD-10-CM

## 2022-01-25 NOTE — Progress Notes (Addendum)
01/25/2022  Patient ID: [name], female   DOB: Jan 09, 1946, 77 y.o.   MRN: [id number]  Subjective: Suzanne Turner is a 77 y.o. year old female who is a primary care patient of Suzanne Lukes, MD. Clinical Pharmacist Practitioner referral was placed to assist with medication management.    Engaged with patient by telephone for follow up visit today.  Patient reports Vyzulta tier 4 again for 2024. Cost is $95 per month. In 2023 we were able to get a tier exception due to allergy / intolerance to other agents for glaucoma.   Patient sees Suzanne Turner for glaucoma care.  Previous eye drops tried for glaucoma:  Rhopressa - allergy, itching Timolol - allergy, rash Alphagan / brimonidine - intolerance, eye irritation / fatigue Zioptan - intolerance, eye irritation   Objective: Review of patient status, including review of consultants reports, laboratory and other test data, was performed as part of comprehensive.  Lab Results  Component Value Date   CREATININE 0.82 08/02/2021   CREATININE 1.09 05/24/2021   CREATININE 0.73 04/17/2021    Lab Results  Component Value Date   HGBA1C 5.7 08/02/2021       Component Value Date/Time   CHOL 208 (H) 08/02/2021 1217   TRIG 118.0 08/02/2021 1217   HDL 65.70 08/02/2021 1217   CHOLHDL 3 08/02/2021 1217   VLDL 23.6 08/02/2021 1217   LDLCALC 118 (H) 08/02/2021 1217   LDLCALC 126 (H) 10/20/2019 1006     BP Readings from Last 3 Encounters:  08/02/21 134/70  07/21/21 108/66  04/17/21 (!) 156/77     Allergies  Allergen Reactions   Medrol [Methylprednisolone] Swelling and Other (See Comments)    Facial and lip swelling, but no breathing issues   Alphagan [Brimonidine] Other (See Comments)    Eye irritation / eye fatigue   Rhopressa [Netarsudil Dimesylate] Itching   Zioptan [Tafluprost (Pf)] Other (See Comments)    Eye irritation   Amlodipine Other (See Comments)    Hip,legs,and feet feels achy and stiff   Lisinopril Rash    Losartan Anxiety   Penicillins Rash   Sulfonamide Derivatives Rash   Timolol Rash    Medications Reviewed Today     Reviewed by Suzanne Turner, RPH-CPP (Pharmacist) on 01/25/22 at 1312  Med List Status: <None>   Medication Order Taking? Sig Documenting Provider Last Dose Status Informant  acetaminophen (TYLENOL) 325 MG tablet [id number] Yes Take 325 mg by mouth daily as needed for pain. [provider] Taking Active Self  aspirin EC 81 MG tablet [id number] Yes Take 81 mg by mouth daily. [provider] Taking Active Self  budesonide-formoterol (SYMBICORT) 80-4.5 MCG/ACT inhaler [id number] No Inhale 2 puffs into the lungs 2 (two) times daily.  Patient not taking: Reported on 26/71/2458   Suzanne Cure, DO Not Taking Active            Med Note Antony Contras, [place] Nov 15, 2021 10:21 AM) Patient was told to use when needed. Has not needed recently.  Calcium Polycarbophil (FIBER-CAPS PO) [id number] Yes Take 3 capsules by mouth in the morning and at bedtime. [provider] Taking Active   carvedilol (COREG) 3.125 MG tablet [id number] Yes TAKE 1 TABLET(3.125 MG) BY MOUTH TWICE DAILY WITH A MEAL Suzanne Lukes, MD Taking Active   Multiple Vitamins-Minerals (HAIR/SKIN/NAILS/BIOTIN PO) [id number] Yes Take 500 mg by mouth daily. [provider] Taking Active Self  VYZULTA 0.024 % SOLN [id number] Yes Place 1 drop  into both eyes at bedtime. 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   COPD (chronic obstructive pulmonary disease) ([place]) 08/02/2021   Constipation 08/02/2021   Preventative health care 11/10/2020   History of UTI 11/10/2020   Retinal vein occlusion of right eye 04/20/2020   Arthritis 10/20/2019   SCC (squamous cell carcinoma), leg, left 09/03/2017   Hyponatremia 03/05/2017   Counseling and coordination of care 01/30/2016   Abnormal thyroid  blood test 01/28/2016   Other iron deficiency anemias 08/02/2014   Hypercalcemia 08/02/2014   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   History of chicken pox    Rash and nonspecific skin eruption 07/23/2013   At risk for coronary artery disease 03/05/2013   Colon polyps 04/17/2011   Tremor 04/17/2011   Hyperlipidemia 03/29/2011   Palpitations 02/07/2011   Vitamin D deficiency 05/07/2008   Essential hypertension 05/07/2008   Osteoporosis 05/07/2008     Medication Assistance:   [employer] today. Provided information for tier exception for 2024.  Reference #[id number] - decision could take up to 72 hours.    Assessment / Plan: Medication management:  Reviewed and updated medication list Reviewed refill history and adherence Contacted [employer] for tier exception.   Health Maintenance:  Updated vaccines - received RSV vaccine 11/11/2021 and COVID vaccine 10/25/2021   Follow Up:  Telephone follow up appointment with care management team member scheduled for:  1 year    Suzanne Turner, PharmD Clinical Pharmacist [facility]  - [facility] [phone]   Addendum: Patient also reports swelling and pain in her jaw and ear. She has had issues with swollen salivary glands in past. She would like to see PCP but Suzanne Charlett Blake is out of office this week. Appointment made for 01/26/2022 with Suzanne Mourning, NP for evaluation.

## 2022-01-25 NOTE — Telephone Encounter (Signed)
Received fax that [place] tier exception was approved thru 01/23/2023. Called to notify patient - LM on her VM.  Approval should lower Vyzulta copay from $95 to $45.

## 2022-01-26 ENCOUNTER — Encounter: Payer: Self-pay | Admitting: Family

## 2022-01-26 ENCOUNTER — Ambulatory Visit (INDEPENDENT_AMBULATORY_CARE_PROVIDER_SITE_OTHER): Payer: Medicare HMO | Admitting: Family

## 2022-01-26 VITALS — BP 124/72 | HR 70 | Temp 97.6°F | Resp 18 | Ht 67.0 in | Wt 131.0 lb

## 2022-01-26 DIAGNOSIS — R590 Localized enlarged lymph nodes: Secondary | ICD-10-CM

## 2022-01-26 MED ORDER — DOXYCYCLINE HYCLATE 100 MG PO TABS: 100.0000 mg | ORAL_TABLET | Freq: Two times a day (BID) | ORAL | 0 refills | Status: DC

## 2022-01-26 NOTE — Progress Notes (Signed)
Suzanne Turner is a 77 y.o. female with the following history as recorded in EpicCare:  Patient Active Problem List   Diagnosis Date Noted   Tobacco abuse, 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   COPD (chronic obstructive pulmonary disease) ([place]) 08/02/2021   Constipation 08/02/2021   Preventative health care 11/10/2020   History of UTI 11/10/2020   Retinal vein occlusion of right eye 04/20/2020   Arthritis 10/20/2019   SCC (squamous cell carcinoma), leg, left 09/03/2017   Hyponatremia 03/05/2017   Counseling and coordination of care 01/30/2016   Abnormal thyroid blood test 01/28/2016   Other iron deficiency anemias 08/02/2014   Hypercalcemia 08/02/2014   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   History of chicken pox    Rash and nonspecific skin eruption 07/23/2013   At risk for coronary artery disease 03/05/2013   Colon polyps 04/17/2011   Tremor 04/17/2011   Hyperlipidemia 03/29/2011   Palpitations 02/07/2011   Vitamin D deficiency 05/07/2008   Essential hypertension 05/07/2008   Osteoporosis 05/07/2008    Current Outpatient Medications  Medication Sig Dispense Refill   ABRYSVO 120 MCG/0.5ML injection      acetaminophen (TYLENOL) 325 MG tablet Take 325 mg by mouth daily as needed for pain.     aspirin EC 81 MG tablet Take 81 mg by mouth daily.     Calcium Polycarbophil (FIBER-CAPS PO) Take 3 capsules by mouth in the morning and at bedtime.     carvedilol (COREG) 3.125 MG tablet TAKE 1 TABLET(3.125 MG) BY MOUTH TWICE DAILY WITH A MEAL 60 tablet 5   doxycycline (VIBRA-TABS) 100 MG tablet Take 1 tablet (100 mg total) by mouth 2 (two) times daily. 14 tablet 0   Multiple Vitamins-Minerals (HAIR/SKIN/NAILS/BIOTIN PO) Take 500 mg by mouth daily.     VYZULTA 0.024 % SOLN Place 1 drop into both eyes at bedtime.     budesonide-formoterol (SYMBICORT) 80-4.5 MCG/ACT inhaler Inhale 2 puffs into the lungs 2 (two) times daily. (Patient not taking: Reported on 11/15/2021) 1 each 12   No current  facility-administered medications for this visit.    Allergies: Medrol [methylprednisolone], Alphagan [brimonidine], Rhopressa [netarsudil dimesylate], Zioptan [tafluprost (pf)], Amlodipine, Lisinopril, Losartan, Penicillins, Sulfonamide derivatives, and Timolol  Past Medical History:  Diagnosis Date   Abnormal thyroid blood test 01/28/2016   Bronchitis    Cataract    Chronic eczema    Glaucoma    Left eye   Glaucoma 07/28/2014   Left eye Dr Shon Hough at [facility]   H/O measles    H/O mumps    High cholesterol    no medications   History of chicken pox    Hypercalcemia 08/02/2014   Hypertension    Kidney stone    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   Left eye Dr Shon Hough at [facility]    Osteoarthritis    Osteopenia    Preventative health care 01/30/2016   Salivary duct stone    Skin cancer    Wears glasses     Past Surgical History:  Procedure Laterality Date   CATARACT EXTRACTION Bilateral    May 31, 2021 and March 2023   ORIF TIBIA & FIBULA FRACTURES Right 1966   REFRACTIVE SURGERY Left March/April 2018   SALIVARY STONE REMOVAL Right 05/15/2012   Procedure: EXCISION OF RIGHT SUBMANDIBULAR GLAND ;  Surgeon: Izora Gala, MD;  Location: [facility] OR;  Service: ENT;  Laterality: Right;   SALIVARY STONE REMOVAL N/A 11/18/2012   Procedure: INTRAORAL EXCISION SUBMANDIBULAR STONE RIGHT ;  Surgeon: Izora Gala,  MD;  Location: [place];  Service: ENT;  Laterality: N/A;   SKIN BIOPSY Right 12/2012   legs   TONSILLECTOMY  1949   TUBAL LIGATION  1977    Family History  Problem Relation Age of Onset   Arrhythmia Mother    Colon cancer Mother 75   Cancer Mother    Tremor Mother    Arrhythmia Maternal Grandmother    Glaucoma Maternal Grandmother    Breast cancer Sister    Cancer Sister        breast   Glaucoma Sister    Heart disease Father 92   Arthritis Father        broken back with chronic pain   Glaucoma Father    Heart disease  Brother    Arrhythmia Brother    Tremor Brother    Hypertension Daughter    Arrhythmia Maternal Uncle    Heart disease Maternal Uncle    Colon polyps Maternal Aunt     Social History   Tobacco Use   Smoking status: Former    Packs/day: 0.50    Years: 40.00    Total pack years: 20.00    Types: Cigarettes    Quit date: 04/15/2021    Years since quitting: 0.7   Smokeless tobacco: Never   Tobacco comments:    Using nicotine lozenges as needed  Substance Use Topics   Alcohol use: No    Subjective:  Patient noted that Friday before Christmas she woke up with marked swelling/ enlarged lymph nodes on both side of her jaw/ neck; notes that had to have salivary glands removed from right side of her neck in the past due to tonsil stones; is concerned that may have a stone on left side of neck because her mouth stays so dry; has noticed marked improvement in her symptoms recently but is concerned that just won't heal completely without antibiotics; is up to date on dental care- had dental exam about 10 days prior to onset of symptoms.   Objective:  Vitals:   01/26/22 1110  BP: 124/72  Pulse: 70  Resp: 18  Temp: 97.6 F (36.4 C)  TempSrc: Oral  SpO2: 99%  Weight: 131 lb (59.4 kg)  Height: '5\' 7"'$  (1.702 m)    General: Well developed, well nourished, in no acute distress  Skin : Warm and dry.  Head: Normocephalic and atraumatic  Eyes: Sclera and conjunctiva clear; pupils round and reactive to light; extraocular movements intact  Ears: External normal; canals clear; tympanic membranes normal  Oropharynx: Pink, supple. No suspicious lesions  Neck: Supple without thyromegaly, adenopathy  Lungs: Respirations unlabored; clear to auscultation bilaterally without wheeze, rales, rhonchi  CVS exam: normal rate and regular rhythm.  Neurologic: Alert and oriented; speech intact; face symmetrical; moves all extremities well; CNII-XII intact without focal deficit   Assessment:  1. Reactive  cervical lymphadenopathy     Plan:  Vs resolving parotitis; Rx for Doxycycline 100 mg bid x 7 days; keep planned follow up with her PCP for mid -January for re-check; may need to consider imaging and/ or ENT evaluation if symptoms persist.  No follow-ups on file.  No orders of the defined types were placed in this encounter.   Requested Prescriptions   Signed Prescriptions Disp Refills   doxycycline (VIBRA-TABS) 100 MG tablet 14 tablet 0    Sig: Take 1 tablet (100 mg total) by mouth 2 (two) times daily.

## 2022-02-14 ENCOUNTER — Ambulatory Visit (INDEPENDENT_AMBULATORY_CARE_PROVIDER_SITE_OTHER): Payer: Medicare HMO | Admitting: Family Medicine

## 2022-02-14 VITALS — BP 128/64 | HR 68 | Temp 97.5°F | Resp 16 | Ht 67.0 in | Wt 131.4 lb

## 2022-02-14 DIAGNOSIS — I10 Essential (primary) hypertension: Secondary | ICD-10-CM | POA: Diagnosis not present

## 2022-02-14 DIAGNOSIS — E559 Vitamin D deficiency, unspecified: Secondary | ICD-10-CM

## 2022-02-14 DIAGNOSIS — J449 Chronic obstructive pulmonary disease, unspecified: Secondary | ICD-10-CM

## 2022-02-14 DIAGNOSIS — Z0000 Encounter for general adult medical examination without abnormal findings: Secondary | ICD-10-CM

## 2022-02-14 DIAGNOSIS — E782 Mixed hyperlipidemia: Secondary | ICD-10-CM

## 2022-02-14 NOTE — Progress Notes (Signed)
Subjective:   By signing my name below, I, [name], attest that this documentation has been prepared under the direction and in the presence of Mosie Lukes, MD., 02/14/2022.   Patient ID: Suzanne Turner, female    DOB: 1945-10-06, 77 y.o.   MRN: [id number]  Chief Complaint  Patient presents with   Annual Exam    Annual Exam   HPI Patient is in today for a comprehensive physical exam and follow up on chronic medical concerns. She denies CP/palpitations/SOB/HA/congestion/fever/chills/GI or GU symptoms.  Family History No changes to the family history.  Glaucoma  Patient follows with ophthalmologists Dr. Prudencio Burly and Dr. 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  Osteoporosis  Patient chooses not to take medications to manage her osteoporosis. However, she does exercise regularly and maintain a balanced diet. She also continues to take a multivitamin with minerals daily.  Reactive Cervical Lymphadenopathy Patient was seen on 01/26/2022 by Jodi Mourning, FNP, due to enlarged lymph nodes on both sides of her jaw/neck that presented on the Friday before Christmas. Doxycycline 100 mg bid for 7 days was prescribed and her symptoms have resolved. 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   Left eye Dr Shon Hough at [facility]    Osteoarthritis    Osteopenia    Preventative health care 01/30/2016   Salivary duct stone    Skin cancer    Wears glasses     Past Surgical History:  Procedure  Laterality Date   CATARACT EXTRACTION Bilateral    May 31, 2021 and March 2023   ORIF TIBIA & FIBULA FRACTURES Right 1966   REFRACTIVE SURGERY Left March/April 2018   SALIVARY STONE REMOVAL Right 05/15/2012   Procedure: EXCISION OF RIGHT SUBMANDIBULAR GLAND ;  Surgeon: Izora Gala, MD;  Location: [facility] OR;  Service: ENT;  Laterality: Right;   SALIVARY STONE REMOVAL N/A 11/18/2012   Procedure: INTRAORAL EXCISION SUBMANDIBULAR STONE RIGHT ;  Surgeon: Izora Gala, MD;  Location: [place];  Service: ENT;  Laterality: N/A;   SKIN BIOPSY Right 12/2012   legs   TONSILLECTOMY  1949   TUBAL LIGATION  1977    Family History  Problem Relation Age of Onset   Arrhythmia Mother    Colon cancer Mother 19   Cancer Mother    Tremor Mother    Arrhythmia Maternal Grandmother    Glaucoma Maternal Grandmother    Breast cancer Sister    Cancer Sister        breast   Glaucoma Sister    Heart disease Father 78   Arthritis Father        broken back with chronic pain   Glaucoma Father    Heart disease Brother    Arrhythmia Brother    Tremor Brother    Hypertension Daughter    Arrhythmia Maternal Uncle    Heart disease Maternal Uncle  Colon polyps Maternal Aunt     Social History   Socioeconomic History   Marital status: Widowed    Spouse name: Not on file   Number of children: Not on file   Years of education: Not on file   Highest education level: Not on file  Occupational History   Not on file  Tobacco Use   Smoking status: Former    Packs/day: 0.50    Years: 40.00    Total pack years: 20.00    Types: Cigarettes    Quit date: 04/15/2021    Years since quitting: 0.8   Smokeless tobacco: Never   Tobacco comments:    Using nicotine lozenges as needed  Substance and Sexual Activity   Alcohol use: No   Drug use: No   Sexual activity: Not Currently    Comment: lives alone, widowed, retired from Actuary, education. and real estate, no dietary restrictions   Other Topics Concern   Not on file  Social History Narrative   Not on file   Social Determinants of Health   Financial Resource Strain: Medium Risk (02/01/2021)   Overall Financial Resource Strain (CARDIA)    Difficulty of Paying Living Expenses: Somewhat hard  Food Insecurity: No Food Insecurity (07/15/2020)   Hunger Vital Sign    Worried About Running Out of Food in the Last Year: Never true    Ran Out of Food in the Last Year: Never true  Transportation Needs: No Transportation Needs (07/15/2020)   PRAPARE - Hydrologist (Medical): No    Lack of Transportation (Non-Medical): No  Physical Activity: Sufficiently Active (07/15/2020)   Exercise Vital Sign    Days of Exercise per Week: 5 days    Minutes of Exercise per Session: 60 min  Stress: No Stress Concern Present (07/15/2020)   [place]    Feeling of Stress : Not at all  Social Connections: Socially Isolated (07/15/2020)   Social Connection and Isolation Panel [NHANES]    Frequency of Communication with Friends and Family: More than three times a week    Frequency of Social Gatherings with Friends and Family: More than three times a week    Attends Religious Services: Never    Marine scientist or Organizations: No    Attends Archivist Meetings: Never    Marital Status: Widowed  Intimate Partner Violence: Not At Risk (07/15/2020)   Humiliation, Afraid, Rape, and Kick questionnaire    Fear of Current or Ex-Partner: No    Emotionally Abused: No    Physically Abused: No    Sexually Abused: No    Outpatient Medications Prior to Visit  Medication Sig Dispense Refill   ABRYSVO 120 MCG/0.5ML injection      acetaminophen (TYLENOL) 325 MG tablet Take 325 mg by mouth daily as needed for pain.     aspirin EC 81 MG tablet Take 81 mg by mouth daily.     budesonide-formoterol (SYMBICORT) 80-4.5 MCG/ACT inhaler Inhale 2 puffs  into the lungs 2 (two) times daily. 1 each 12   Calcium Polycarbophil (FIBER-CAPS PO) Take 3 capsules by mouth in the morning and at bedtime.     carvedilol (COREG) 3.125 MG tablet TAKE 1 TABLET(3.125 MG) BY MOUTH TWICE DAILY WITH A MEAL 60 tablet 5   doxycycline (VIBRA-TABS) 100 MG tablet Take 1 tablet (100 mg total) by mouth 2 (two) times daily. 14 tablet 0   Multiple Vitamins-Minerals (  HAIR/SKIN/NAILS/BIOTIN PO) Take 500 mg by mouth daily.     VYZULTA 0.024 % SOLN Place 1 drop into both eyes at bedtime.     No facility-administered medications prior to visit.    Allergies  Allergen Reactions   Medrol [Methylprednisolone] Swelling and Other (See Comments)    Facial and lip swelling, but no breathing issues   Alphagan [Brimonidine] Other (See Comments)    Eye irritation / eye fatigue   Rhopressa [Netarsudil Dimesylate] Itching   Zioptan [Tafluprost (Pf)] Other (See Comments)    Eye irritation   Amlodipine Other (See Comments)    Hip,legs,and feet feels achy and stiff   Lisinopril Rash   Losartan Anxiety   Penicillins Rash   Sulfonamide Derivatives Rash   Timolol Rash    Review of Systems  Constitutional:  Negative for chills and fever.  HENT:  Negative for congestion.   Respiratory:  Negative for shortness of breath.   Cardiovascular:  Negative for chest pain and palpitations.  Gastrointestinal:  Negative for abdominal pain, blood in stool, constipation, diarrhea, nausea and vomiting.  Genitourinary:  Negative for dysuria, frequency, hematuria and urgency.  Skin:           Neurological:  Negative for headaches.       Objective:    Physical Exam Constitutional:      General: She is not in acute distress.    Appearance: Normal appearance. She is normal weight. She is not ill-appearing.  HENT:     Head: Normocephalic and atraumatic.     Right Ear: Tympanic membrane, ear canal and external ear normal.     Left Ear: Tympanic membrane, ear canal and external ear normal.      Nose: Nose normal.     Mouth/Throat:     Mouth: Mucous membranes are dry.     Pharynx: Oropharynx is clear.  Eyes:     General:        Right eye: No discharge.        Left eye: No discharge.     Extraocular Movements: Extraocular movements intact.     Right eye: No nystagmus.     Left eye: No nystagmus.     Pupils: Pupils are equal, round, and reactive to light.  Neck:     Vascular: No carotid bruit.  Cardiovascular:     Rate and Rhythm: Normal rate and regular rhythm.     Pulses: Normal pulses.     Heart sounds: Normal heart sounds. No murmur heard.    No gallop.  Pulmonary:     Effort: Pulmonary effort is normal. No respiratory distress.     Breath sounds: Normal breath sounds. No wheezing or rales.  Abdominal:     General: Bowel sounds are normal.     Palpations: Abdomen is soft.     Tenderness: There is no abdominal tenderness. There is no guarding.  Musculoskeletal:        General: Normal range of motion.     Cervical back: Normal range of motion.     Right lower leg: No edema.     Left lower leg: No edema.     Comments: Muscle strength 5/5 on upper and lower extremities.   Lymphadenopathy:     Cervical: No cervical adenopathy.  Skin:    General: Skin is warm and dry.  Neurological:     Mental Status: She is alert and oriented to person, place, and time.     Sensory: Sensation is intact.  Motor: Tremor (both arms) present.     Coordination: Coordination is intact.     Deep Tendon Reflexes:     Reflex Scores:      Patellar reflexes are 2+ on the right side and 2+ on the left side. Psychiatric:        Mood and Affect: Mood normal.        Behavior: Behavior normal.        Judgment: Judgment normal.     BP 128/64 (BP Location: Right Arm, Patient Position: Sitting, Cuff Size: Normal)   Pulse 68   Temp (!) 97.5 F (36.4 C) (Oral)   Resp 16   Ht '5\' 7"'$  (1.702 m)   Wt 131 lb 6.4 oz (59.6 kg)   SpO2 95%   BMI 20.58 kg/m  Wt Readings from Last 3  Encounters:  02/14/22 131 lb 6.4 oz (59.6 kg)  01/26/22 131 lb (59.4 kg)  08/02/21 130 lb (59 kg)    Diabetic Foot Exam - Simple   No data filed    Lab Results  Component Value Date   WBC 4.9 08/02/2021   HGB 12.6 08/02/2021   HCT 36.9 08/02/2021   PLT 187.0 08/02/2021   GLUCOSE 78 08/02/2021   CHOL 208 (H) 08/02/2021   TRIG 118.0 08/02/2021   HDL 65.70 08/02/2021   LDLCALC 118 (H) 08/02/2021   ALT 12 08/02/2021   AST 22 08/02/2021   NA 135 08/02/2021   K 3.9 08/02/2021   CL 101 08/02/2021   CREATININE 0.82 08/02/2021   BUN 16 08/02/2021   CO2 27 08/02/2021   TSH 2.77 08/02/2021   INR 1.0 03/22/2020   HGBA1C 5.7 08/02/2021    Lab Results  Component Value Date   TSH 2.77 08/02/2021   Lab Results  Component Value Date   WBC 4.9 08/02/2021   HGB 12.6 08/02/2021   HCT 36.9 08/02/2021   MCV 92.8 08/02/2021   PLT 187.0 08/02/2021   Lab Results  Component Value Date   NA 135 08/02/2021   K 3.9 08/02/2021   CO2 27 08/02/2021   GLUCOSE 78 08/02/2021   BUN 16 08/02/2021   CREATININE 0.82 08/02/2021   BILITOT 0.8 08/02/2021   ALKPHOS 58 08/02/2021   AST 22 08/02/2021   ALT 12 08/02/2021   PROT 7.4 08/02/2021   ALBUMIN 4.5 08/02/2021   CALCIUM 9.5 08/02/2021   ANIONGAP 11 04/17/2021   GFR 69.57 08/02/2021   Lab Results  Component Value Date   CHOL 208 (H) 08/02/2021   Lab Results  Component Value Date   HDL 65.70 08/02/2021   Lab Results  Component Value Date   LDLCALC 118 (H) 08/02/2021   Lab Results  Component Value Date   TRIG 118.0 08/02/2021   Lab Results  Component Value Date   CHOLHDL 3 08/02/2021   Lab Results  Component Value Date   HGBA1C 5.7 08/02/2021      Assessment & Plan:  Colonoscopy: Last completed in 2015. Normal colonoscopy. Repeat in 10 years.  DEXA: Last completed on 05/28/2019. The BMD measured at Femur Neck Left is 0.639 g/cm2 with a T-score of -2.9. This patient is considered OSTEOPOROTIC according to [facility]) criteria. The scan quality is good.  Repeat in 2-5 years. She is not interested in repeating a DEXA scan at this time. Encouraged multivitamins with minerals and vitamin D3 2000 IU daily.   Mammogram: Last completed on 05/19/2021. No mammographic evidence of malignancy. Repeat in 1-2 years.  Pap Smear: Last completed on 04/01/2014 with normal results. Discontinued.  Healthy Lifestyle: Encouraged exercise, heart healthy diet, and hydration.  Immunizations: Reviewed patient's immunization history. Tetanus immunization due in 2026 unless injured. Problem List Items Addressed This Visit     COPD (chronic obstructive pulmonary disease) ([place])    No recent exacerbation.       Essential hypertension    Well controlled, no changes to meds. Encouraged heart healthy diet such as the DASH diet and exercise as tolerated.       Hyperlipidemia    Encourage heart healthy diet such as MIND or DASH diet, increase exercise, avoid trans fats, simple carbohydrates and processed foods, consider a krill or fish or flaxseed oil cap daily.       Preventative health care    Patient encouraged to maintain heart healthy diet, regular exercise, adequate sleep. Consider daily probiotics. Take medications as prescribed. Labs ordered and reviewed. MGM in 2023 repeat in 2024. Colonoscopy due in 3 years. Dexa scan 2021, repeat in 2025 or 26. Aged out of paps      Vitamin D deficiency - Primary    Supplement and monitor      No orders of the defined types were placed in this encounter.  I, Penni Homans, MD, personally preformed the services described in this documentation.  All medical record entries made by the scribe were at my direction and in my presence.  I have reviewed the chart and discharge instructions (if applicable) and agree that the record reflects my personal performance and is accurate and complete. 02/14/2022  I,Mohammed Iqbal,acting as a scribe for Penni Homans, MD.,have  documented all relevant documentation on the behalf of Penni Homans, MD,as directed by  Penni Homans, MD while in the presence of Penni Homans, MD.  Penni Homans, MD

## 2022-02-14 NOTE — Patient Instructions (Addendum)
Tetanus in 2026 or sooner if injured.  Vitamin D3 2000 IU daily, Over the counter  Preventive Care 65 Years and Older, Female Preventive care refers to lifestyle choices and visits with your health care provider that can promote health and wellness. Preventive care visits are also called wellness exams. What can I expect for my preventive care visit? Counseling Your health care provider may ask you questions about your: Medical history, including: Past medical problems. Family medical history. Pregnancy and menstrual history. History of falls. Current health, including: Memory and ability to understand (cognition). Emotional well-being. Home life and relationship well-being. Sexual activity and sexual health. Lifestyle, including: Alcohol, nicotine or tobacco, and drug use. Access to firearms. Diet, exercise, and sleep habits. Work and work Statistician. Sunscreen use. Safety issues such as seatbelt and bike helmet use. Physical exam Your health care provider will check your: Height and weight. These may be used to calculate your BMI (body mass index). BMI is a measurement that tells if you are at a healthy weight. Waist circumference. This measures the distance around your waistline. This measurement also tells if you are at a healthy weight and may help predict your risk of certain diseases, such as type 2 diabetes and high blood pressure. Heart rate and blood pressure. Body temperature. Skin for abnormal spots. What immunizations do I need?  Vaccines are usually given at various ages, according to a schedule. Your health care provider will recommend vaccines for you based on your age, medical history, and lifestyle or other factors, such as travel or where you work. What tests do I need? Screening Your health care provider may recommend screening tests for certain conditions. This may include: Lipid and cholesterol levels. Hepatitis C test. Hepatitis B test. HIV (human  immunodeficiency virus) test. STI (sexually transmitted infection) testing, if you are at risk. Lung cancer screening. Colorectal cancer screening. Diabetes screening. This is done by checking your blood sugar (glucose) after you have not eaten for a while (fasting). Mammogram. Talk with your health care provider about how often you should have regular mammograms. BRCA-related cancer screening. This may be done if you have a family history of breast, ovarian, tubal, or peritoneal cancers. Bone density scan. This is done to screen for osteoporosis. Talk with your health care provider about your test results, treatment options, and if necessary, the need for more tests. Follow these instructions at home: Eating and drinking  Eat a diet that includes fresh fruits and vegetables, whole grains, lean protein, and low-fat dairy products. Limit your intake of foods with high amounts of sugar, saturated fats, and salt. Take vitamin and mineral supplements as recommended by your health care provider. Do not drink alcohol if your health care provider tells you not to drink. If you drink alcohol: Limit how much you have to 0-1 drink a day. Know how much alcohol is in your drink. In the [place] one drink equals one 12 oz bottle of beer (355 mL), one 5 oz glass of wine (148 mL), or one 1 oz glass of hard liquor (44 mL). Lifestyle Brush your teeth every morning and night with fluoride toothpaste. Floss one time each day. Exercise for at least 30 minutes 5 or more days each week. Do not use any products that contain nicotine or tobacco. These products include cigarettes, chewing tobacco, and vaping devices, such as e-cigarettes. If you need help quitting, ask your health care provider. Do not use drugs. If you are sexually active, practice safe sex. Use a  condom or other form of protection in order to prevent STIs. Take aspirin only as told by your health care provider. Make sure that you understand how much  to take and what form to take. Work with your health care provider to find out whether it is safe and beneficial for you to take aspirin daily. Ask your health care provider if you need to take a cholesterol-lowering medicine (statin). Find healthy ways to manage stress, such as: Meditation, yoga, or listening to music. Journaling. Talking to a trusted person. Spending time with friends and family. Minimize exposure to UV radiation to reduce your risk of skin cancer. Safety Always wear your seat belt while driving or riding in a vehicle. Do not drive: If you have been drinking alcohol. Do not ride with someone who has been drinking. When you are tired or distracted. While texting. If you have been using any mind-altering substances or drugs. Wear a helmet and other protective equipment during sports activities. If you have firearms in your house, make sure you follow all gun safety procedures. What's next? Visit your health care provider once a year for an annual wellness visit. Ask your health care provider how often you should have your eyes and teeth checked. Stay up to date on all vaccines. This information is not intended to replace advice given to you by your health care provider. Make sure you discuss any questions you have with your health care provider. Document Revised: 07/07/2020 Document Reviewed: 07/07/2020 Elsevier Patient Education  [place].

## 2022-02-15 DIAGNOSIS — H35033 Hypertensive retinopathy, bilateral: Secondary | ICD-10-CM | POA: Diagnosis not present

## 2022-02-15 DIAGNOSIS — H34831 Tributary (branch) retinal vein occlusion, right eye, with macular edema: Secondary | ICD-10-CM | POA: Diagnosis not present

## 2022-02-15 DIAGNOSIS — H3581 Retinal edema: Secondary | ICD-10-CM | POA: Diagnosis not present

## 2022-02-15 DIAGNOSIS — Z961 Presence of intraocular lens: Secondary | ICD-10-CM | POA: Diagnosis not present

## 2022-02-19 NOTE — Assessment & Plan Note (Addendum)
Patient encouraged to maintain heart healthy diet, regular exercise, adequate sleep. Consider daily probiotics. Take medications as prescribed. Labs ordered and reviewed. MGM in 2023 repeat in 2024. Colonoscopy due in 3 years. Dexa scan 2021, repeat in 2025 or 26. Aged out of paps

## 2022-02-19 NOTE — Assessment & Plan Note (Signed)
No recent exacerbation 

## 2022-02-19 NOTE — Assessment & Plan Note (Signed)
Encourage heart healthy diet such as MIND or DASH diet, increase exercise, avoid trans fats, simple carbohydrates and processed foods, consider a krill or fish or flaxseed oil cap daily.  °

## 2022-02-19 NOTE — Assessment & Plan Note (Signed)
Well controlled, no changes to meds. Encouraged heart healthy diet such as the DASH diet and exercise as tolerated.  °

## 2022-02-19 NOTE — Assessment & Plan Note (Signed)
Supplement and monitor 

## 2022-03-01 DIAGNOSIS — H34831 Tributary (branch) retinal vein occlusion, right eye, with macular edema: Secondary | ICD-10-CM | POA: Diagnosis not present

## 2022-04-18 ENCOUNTER — Other Ambulatory Visit: Payer: Self-pay | Admitting: Family Medicine

## 2022-04-18 DIAGNOSIS — Z1231 Encounter for screening mammogram for malignant neoplasm of breast: Secondary | ICD-10-CM

## 2022-04-24 ENCOUNTER — Encounter: Payer: Self-pay | Admitting: Family Medicine

## 2022-04-25 ENCOUNTER — Other Ambulatory Visit: Payer: Self-pay | Admitting: Family Medicine

## 2022-04-25 ENCOUNTER — Other Ambulatory Visit: Payer: Self-pay

## 2022-04-25 DIAGNOSIS — I10 Essential (primary) hypertension: Secondary | ICD-10-CM

## 2022-04-25 MED ORDER — POTASSIUM CHLORIDE CRYS ER 20 MEQ PO TBCR: 20.0000 meq | EXTENDED_RELEASE_TABLET | Freq: Every day | ORAL | 3 refills | Status: DC

## 2022-04-25 MED ORDER — HYDROCHLOROTHIAZIDE 25 MG PO TABS: ORAL_TABLET | ORAL | 1 refills | Status: DC

## 2022-04-25 NOTE — Telephone Encounter (Signed)
Called pt was advised Dr.Blyth has discontinued the carvedilol and restarted the HCT and kcl. Lab appt and nurse visit appt was made.

## 2022-05-16 ENCOUNTER — Other Ambulatory Visit (INDEPENDENT_AMBULATORY_CARE_PROVIDER_SITE_OTHER): Payer: Medicare HMO

## 2022-05-16 ENCOUNTER — Other Ambulatory Visit: Payer: Self-pay

## 2022-05-16 ENCOUNTER — Ambulatory Visit (INDEPENDENT_AMBULATORY_CARE_PROVIDER_SITE_OTHER): Payer: Medicare HMO

## 2022-05-16 VITALS — BP 124/74 | HR 59

## 2022-05-16 DIAGNOSIS — I10 Essential (primary) hypertension: Secondary | ICD-10-CM

## 2022-05-16 DIAGNOSIS — I1A0 Resistant hypertension: Secondary | ICD-10-CM

## 2022-05-16 LAB — COMPREHENSIVE METABOLIC PANEL
ALT: 13 U/L (ref 0–35)
AST: 23 U/L (ref 0–37)
Albumin: 4.4 g/dL (ref 3.5–5.2)
Alkaline Phosphatase: 61 U/L (ref 39–117)
BUN: 18 mg/dL (ref 6–23)
CO2: 27 mEq/L (ref 19–32)
Calcium: 9.8 mg/dL (ref 8.4–10.5)
Chloride: 98 mEq/L (ref 96–112)
Creatinine, Ser: 0.89 mg/dL (ref 0.40–1.20)
GFR: 62.71 mL/min (ref >60.00)
Glucose, Bld: 70 mg/dL (ref 70–99)
Potassium: 4 mEq/L (ref 3.5–5.1)
Sodium: 132 mEq/L — ABNORMAL LOW (ref 135–145)
Total Bilirubin: 0.7 mg/dL (ref 0.2–1.2)
Total Protein: 7.6 g/dL (ref 6.0–8.3)

## 2022-05-16 NOTE — Progress Notes (Signed)
Pt here for Blood pressure check per   Pt currently takes: HTC TAKE 1/2 TABLET BY MOUTH THREE TIMES PER WEEK   Pt reports compliance with medication.  BP today @ =122/70 120/74 HR =59  Pt advised per DOD Dr.Lowne no changes keep taking medication as same.

## 2022-05-17 DIAGNOSIS — H34831 Tributary (branch) retinal vein occlusion, right eye, with macular edema: Secondary | ICD-10-CM | POA: Diagnosis not present

## 2022-06-01 ENCOUNTER — Ambulatory Visit
Admission: RE | Admit: 2022-06-01 | Discharge: 2022-06-01 | Disposition: A | Discharge status: Home / Self Care | Payer: Medicare HMO | Source: Ambulatory Visit | Attending: Family Medicine | Admitting: Family Medicine

## 2022-06-01 DIAGNOSIS — Z1231 Encounter for screening mammogram for malignant neoplasm of breast: Secondary | ICD-10-CM | POA: Diagnosis not present

## 2022-06-15 ENCOUNTER — Other Ambulatory Visit (INDEPENDENT_AMBULATORY_CARE_PROVIDER_SITE_OTHER): Payer: Medicare HMO

## 2022-06-15 DIAGNOSIS — I10 Essential (primary) hypertension: Secondary | ICD-10-CM | POA: Diagnosis not present

## 2022-06-15 LAB — COMPREHENSIVE METABOLIC PANEL
ALT: 12 U/L (ref 0–35)
AST: 21 U/L (ref 0–37)
Albumin: 4.2 g/dL (ref 3.5–5.2)
Alkaline Phosphatase: 63 U/L (ref 39–117)
BUN: 14 mg/dL (ref 6–23)
CO2: 25 mEq/L (ref 19–32)
Calcium: 9.7 mg/dL (ref 8.4–10.5)
Chloride: 103 mEq/L (ref 96–112)
Creatinine, Ser: 0.85 mg/dL (ref 0.40–1.20)
GFR: 66.23 mL/min (ref >60.00)
Glucose, Bld: 61 mg/dL — ABNORMAL LOW (ref 70–99)
Potassium: 4.2 mEq/L (ref 3.5–5.1)
Sodium: 135 mEq/L (ref 135–145)
Total Bilirubin: 0.8 mg/dL (ref 0.2–1.2)
Total Protein: 7.4 g/dL (ref 6.0–8.3)

## 2022-07-25 ENCOUNTER — Ambulatory Visit (INDEPENDENT_AMBULATORY_CARE_PROVIDER_SITE_OTHER): Payer: Medicare HMO | Admitting: *Deleted

## 2022-07-25 VITALS — BP 152/84 | HR 69 | Ht 67.0 in | Wt 133.2 lb

## 2022-07-25 DIAGNOSIS — Z0000 Encounter for general adult medical examination without abnormal findings: Secondary | ICD-10-CM | POA: Diagnosis not present

## 2022-07-25 NOTE — Progress Notes (Signed)
Subjective:   Suzanne Turner is a 78 y.o. female who presents for Medicare Annual (Subsequent) preventive examination.  Visit Complete: In person  Patient Medicare AWV questionnaire was completed by the patient on 07/21/22; I have confirmed that all information answered by patient is correct and no changes since this date.  Review of Systems     Cardiac Risk Factors include: advanced age (>66men, >63 women);dyslipidemia;hypertension;smoking/ tobacco exposure     Objective:    Today's Vitals   07/25/22 1048 07/25/22 1106  BP: (!) 167/85 (!) 152/84  Pulse: 75 69  Weight: 133 lb 3.2 oz (60.4 kg)   Height: 5\' 7"  (1.702 m)    Body mass index is 20.86 kg/m.     07/25/2022   10:47 AM 07/21/2021   11:36 AM 07/21/2021   11:09 AM 04/17/2021    5:15 PM 07/15/2020    3:05 PM 07/07/2019    2:37 PM 07/05/2018    1:14 PM  Advanced Directives  Does Patient Have a Medical Advance Directive? Yes Yes No No Yes Yes Yes  Type of Estate agent of [place];Living will Healthcare Power of [place];Living will;Out of facility DNR (pink MOST or yellow form)   Healthcare Power of [place];Living will Healthcare Power of [place];Living will Healthcare Power of [place];Living will  Does patient want to make changes to medical advance directive?      No - Patient declined No - Patient declined  Copy of Healthcare Power of Attorney in Chart? Yes - validated most recent copy scanned in chart (See row information) Yes - validated most recent copy scanned in chart (See row information)   Yes - validated most recent copy scanned in chart (See row information) Yes - validated most recent copy scanned in chart (See row information) Yes - validated most recent copy scanned in chart (See row information)  Would patient like information on creating a medical advance directive?  No - Patient declined No - Patient declined No - Patient declined       Current Medications (verified) Outpatient  Encounter Medications as of 07/25/2022  Medication Sig   ABRYSVO 120 MCG/0.5ML injection    acetaminophen (TYLENOL) 325 MG tablet Take 325 mg by mouth daily as needed for pain.   aspirin EC 81 MG tablet Take 81 mg by mouth daily.   budesonide-formoterol (SYMBICORT) 80-4.5 MCG/ACT inhaler Inhale 2 puffs into the lungs 2 (two) times daily.   Calcium Polycarbophil (FIBER-CAPS PO) Take 3 capsules by mouth in the morning and at bedtime.   hydrochlorothiazide (HYDRODIURIL) 25 MG tablet TAKE 1/2 TABLET BY MOUTH THREE TIMES PER WEEK   Multiple Vitamins-Minerals (HAIR/SKIN/NAILS/BIOTIN PO) Take 500 mg by mouth daily.   potassium chloride SA (KLOR-CON M) 20 MEQ tablet Take 1 tablet (20 mEq total) by mouth daily.   VYZULTA 0.024 % SOLN Place 1 drop into both eyes at bedtime.   [DISCONTINUED] doxycycline (VIBRA-TABS) 100 MG tablet Take 1 tablet (100 mg total) by mouth 2 (two) times daily.   No facility-administered encounter medications on file as of 07/25/2022.    Allergies (verified) Medrol [methylprednisolone], Alphagan [brimonidine], Rhopressa [netarsudil dimesylate], Zioptan [tafluprost (pf)], Amlodipine, Lisinopril, Losartan, Penicillins, Sulfonamide derivatives, and Timolol   History: Past Medical History:  Diagnosis Date   Abnormal thyroid blood test 01/28/2016   Allergy    See attached list   Bronchitis    Cataract    Chronic eczema    Glaucoma    Left eye   Glaucoma 07/28/2014  Left eye Dr Mckinley Jewel at [facility]   H/O measles    H/O mumps    High cholesterol    no medications   History of chicken pox    Hypercalcemia 08/02/2014   Hypertension    Kidney stone    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   Left eye Dr Mckinley Jewel at [facility]    Osteoarthritis    Osteopenia    Preventative health care 01/30/2016   Salivary duct stone    Skin cancer    Wears glasses    Past Surgical History:  Procedure Laterality Date   CATARACT EXTRACTION  Bilateral    May 31, 2021 and March 2023   EYE SURGERY  Cataracts 2023   ORIF TIBIA & FIBULA FRACTURES Right 1966   REFRACTIVE SURGERY Left March/April 2018   SALIVARY STONE REMOVAL Right 05/15/2012   Procedure: EXCISION OF RIGHT SUBMANDIBULAR GLAND ;  Surgeon: Serena Colonel, MD;  Location: [facility] OR;  Service: ENT;  Laterality: Right;   SALIVARY STONE REMOVAL N/A 11/18/2012   Procedure: INTRAORAL EXCISION SUBMANDIBULAR STONE RIGHT ;  Surgeon: Serena Colonel, MD;  Location: [facility];  Service: ENT;  Laterality: N/A;   SKIN BIOPSY Right 12/2012   legs   TONSILLECTOMY  1949   TUBAL LIGATION  1977   Family History  Problem Relation Age of Onset   Arrhythmia Mother    Colon cancer Mother 42   Cancer Mother    Tremor Mother    Heart disease Mother    Arrhythmia Maternal Grandmother    Glaucoma Maternal Grandmother    Breast cancer Sister    Cancer Sister        breast   Glaucoma Sister    Heart disease Father 18   Arthritis Father        broken back with chronic pain   Glaucoma Father    Heart disease Brother    Arrhythmia Brother    Tremor Brother    Hypertension Daughter    Arrhythmia Maternal Uncle    Heart disease Maternal Uncle    Colon polyps Maternal Aunt    Social History   Socioeconomic History   Marital status: Widowed    Spouse name: Not on file   Number of children: Not on file   Years of education: Not on file   Highest education level: Not on file  Occupational History   Not on file  Tobacco Use   Smoking status: Former    Packs/day: 0.50    Years: 40.00    Additional pack years: 0.00    Total pack years: 20.00    Types: Cigarettes    Quit date: 04/15/2021    Years since quitting: 1.2   Smokeless tobacco: Never   Tobacco comments:    Using nicotine lozenges as needed  Substance and Sexual Activity   Alcohol use: No   Drug use: No   Sexual activity: Not Currently    Comment: lives alone, widowed, retired from Print production planner,  education. and real estate, no dietary restrictions  Other Topics Concern   Not on file  Social History Narrative   Not on file   Social Determinants of Health   Financial Resource Strain: Low Risk  (07/21/2022)   Overall Financial Resource Strain (CARDIA)    Difficulty of Paying Living Expenses: Not very hard  Food Insecurity: No Food Insecurity (07/21/2022)   Hunger Vital Sign    Worried About Running Out of Food in the Last Year:  Never true    Ran Out of Food in the Last Year: Never true  Transportation Needs: No Transportation Needs (07/21/2022)   PRAPARE - Administrator, Civil Service (Medical): No    Lack of Transportation (Non-Medical): No  Physical Activity: Insufficiently Active (07/21/2022)   Exercise Vital Sign    Days of Exercise per Week: 2 days    Minutes of Exercise per Session: 60 min  Stress: Stress Concern Present (07/21/2022)   [employer] of Occupational Health - Occupational Stress Questionnaire    Feeling of Stress : To some extent  Social Connections: Unknown (07/21/2022)   Social Connection and Isolation Panel [NHANES]    Frequency of Communication with Friends and Family: More than three times a week    Frequency of Social Gatherings with Friends and Family: More than three times a week    Attends Religious Services: Not on file    Active Member of Clubs or Organizations: No    Attends Banker Meetings: Never    Marital Status: Widowed    Tobacco Counseling Counseling given: Not Answered Tobacco comments: Using nicotine lozenges as needed   Clinical Intake:  Pre-visit preparation completed: Yes  Pain : No/denies pain  Nutritional Risks: None Diabetes: No  How often do you need to have someone help you when you read instructions, pamphlets, or other written materials from your doctor or pharmacy?: 1 - Never  Interpreter Needed?: No  Information entered by :: Donne Anon, CMA   Activities of Daily Living     07/21/2022   10:20 PM  In your present state of health, do you have any difficulty performing the following activities:  Hearing? 0  Vision? 1  Difficulty concentrating or making decisions? 0  Walking or climbing stairs? 0  Dressing or bathing? 0  Doing errands, shopping? 0  Preparing Food and eating ? N  Using the Toilet? N  In the past six months, have you accidently leaked urine? Y  Do you have problems with loss of bowel control? Y  Managing your Medications? N  Managing your Finances? N  Housekeeping or managing your Housekeeping? N    Patient Care Team: Bradd Canary, MD as PCP - General (Family Medicine) Laurey Morale, MD as Consulting Physician (Cardiology) Mckinley Jewel, MD as Consulting Physician (Ophthalmology) Danford Bad. Delane Ginger, DDS as Consulting Physician (Dentistry) Henrene Pastor, RPH-CPP (Pharmacist)  Indicate any recent Medical Services you may have received from other than [facility] providers in the past year (date may be approximate).     Assessment:   This is a routine wellness examination for Jenella.  Hearing/Vision screen No results found.  Dietary issues and exercise activities discussed:     Goals Addressed   None    Depression Screen    07/25/2022   11:01 AM 02/14/2022    1:18 PM 01/26/2022   11:15 AM 08/02/2021   11:38 AM 07/21/2021   11:11 AM 04/15/2021    1:01 PM 11/09/2020   11:18 AM  PHQ 2/9 Scores  PHQ - 2 Score 1 0 0 0 0 0 4  PHQ- 9 Score  0     8    Fall Risk    07/21/2022   10:20 PM 02/14/2022    1:18 PM 01/26/2022   11:14 AM 08/02/2021   11:38 AM 07/21/2021   11:09 AM  Fall Risk   Falls in the past year? 0 0 0 0 0  Number falls in past yr: 0  0 0 0 0  Injury with Fall? 0 0 0 0 0  Risk for fall due to : No Fall Risks  No Fall Risks No Fall Risks No Fall Risks  Follow up Falls evaluation completed Falls evaluation completed Falls evaluation completed Falls evaluation completed Falls evaluation completed    MEDICARE RISK AT  HOME:   TIMED UP AND GO:  Was the test performed?  Yes  Length of time to ambulate 10 feet: 8 sec Gait slow and steady without use of assistive device    Cognitive Function:    06/29/2016   11:51 AM  MMSE - Mini Mental State Exam  Orientation to time 4  Orientation to Place 5  Registration 3  Attention/ Calculation 4  Recall 3  Language- name 2 objects 2  Language- repeat 1  Language- follow 3 step command 3  Language- read & follow direction 1  Write a sentence 1  Copy design 1  Total score 28        07/25/2022   11:03 AM 07/21/2021   11:20 AM 07/15/2020    3:18 PM  6CIT Screen  What Year? 0 points 0 points 0 points  What month? 0 points 0 points 0 points  What time? 0 points 0 points 0 points  Count back from 20 0 points 0 points 0 points  Months in reverse 0 points 0 points 0 points  Repeat phrase 0 points 0 points 2 points  Total Score 0 points 0 points 2 points    Immunizations Immunization History  Administered Date(s) Administered   Fluad Quad(high Dose 65+) 10/20/2019   Influenza Split 10/23/2010   Influenza, High Dose Seasonal PF 11/13/2016, 11/13/2017, 10/10/2021   Influenza,inj,Quad PF,6+ Mos 12/11/2012   Influenza-Unspecified 11/05/2013, 11/16/2014, 10/08/2020   Moderna Covid-19 Vaccine Bivalent Booster 80yrs & up 10/08/2020   Moderna SARS-COV2 Booster Vaccination 10/08/2020   PFIZER(Purple Top)SARS-COV-2 Vaccination 03/01/2019, 03/26/2019, 11/22/2019, 05/21/2020   Pfizer Covid-19 Vaccine Bivalent Booster 57yrs & up 10/25/2021   Pneumococcal Conjugate-13 06/23/2010, 05/11/2014   Respiratory Syncytial Virus Vaccine,Recomb Aduvanted(Arexvy) 11/11/2021   Tdap 04/01/2014   Zoster Recombinant(Shingrix) 10/12/2017, 12/14/2017   Zoster, Live 07/24/2006    TDAP status: Up to date  Flu Vaccine status: Up to date  Pneumococcal vaccine status: Due, Education has been provided regarding the importance of this vaccine. Advised may receive this vaccine at  local pharmacy or Health Dept. Aware to provide a copy of the vaccination record if obtained from local pharmacy or Health Dept. Verbalized acceptance and understanding.  Covid-19 vaccine status: Information provided on how to obtain vaccines.   Qualifies for Shingles Vaccine? Yes   Zostavax completed Yes   Shingrix Completed?: Yes  Screening Tests Health Maintenance  Topic Date Due   Pneumonia Vaccine 69+ Years old (2 of 2 - PPSV23 or PCV20) 07/06/2014   Lung Cancer Screening  03/22/2021   COVID-19 Vaccine (6 - 2023-24 season) 12/20/2021   Medicare Annual Wellness (AWV)  07/22/2022   INFLUENZA VACCINE  08/24/2022   DTaP/Tdap/Td (2 - Td or Tdap) 03/31/2024   DEXA SCAN  Completed   Hepatitis C Screening  Completed   Zoster Vaccines- Shingrix  Completed   HPV VACCINES  Aged Out   Colonoscopy  Discontinued    Health Maintenance  Health Maintenance Due  Topic Date Due   Pneumonia Vaccine 76+ Years old (2 of 2 - PPSV23 or PCV20) 07/06/2014   Lung Cancer Screening  03/22/2021   COVID-19 Vaccine (6 - 2023-24 season)  12/20/2021   Medicare Annual Wellness (AWV)  07/22/2022    Colorectal cancer screening: No longer required.   Mammogram status: Completed 06/01/22. Repeat every year  Bone Density status: Completed 05/28/19. Results reflect: Bone density results: OSTEOPOROSIS. Repeat every 2 years.  Lung Cancer Screening: (Low Dose CT Chest recommended if Age 39-80 years, 20 pack-year currently smoking OR have quit w/in 15years.) does qualify.   Lung Cancer Screening Referral: pt declined  Additional Screening:  Hepatitis C Screening: does qualify; Completed 03/05/17  Vision Screening: Recommended annual ophthalmology exams for early detection of glaucoma and other disorders of the eye. Is the patient up to date with their annual eye exam?  Yes  Who is the provider or what is the name of the office in which the patient attends annual eye exams? Dr. Randon Goldsmith and Dr. Allena Katz If pt is not  established with a provider, would they like to be referred to a provider to establish care? No .   Dental Screening: Recommended annual dental exams for proper oral hygiene  Diabetic Foot Exam: N/a  Community Resource Referral / Chronic Care Management: CRR required this visit?  No   CCM required this visit?  No     Plan:     I have personally reviewed and noted the following in the patient's chart:   Medical and social history Use of alcohol, tobacco or illicit drugs  Current medications and supplements including opioid prescriptions. Patient is not currently taking opioid prescriptions. Functional ability and status Nutritional status Physical activity Advanced directives List of other physicians Hospitalizations, surgeries, and ER visits in previous 12 months Vitals Screenings to include cognitive, depression, and falls Referrals and appointments  In addition, I have reviewed and discussed with patient certain preventive protocols, quality metrics, and best practice recommendations. A written personalized care plan for preventive services as well as general preventive health recommendations were provided to patient.     Donne Anon, CMA   07/25/2022   After Visit Summary: Sent to mychart  Nurse Notes: None

## 2022-07-25 NOTE — Patient Instructions (Signed)
Suzanne Turner , Thank you for taking time to come for your Medicare Wellness Visit. I appreciate your ongoing commitment to your health goals. Please review the following plan we discussed and let me know if I can assist you in the future.     This is a list of the screening recommended for you and due dates:  Health Maintenance  Topic Date Due   Pneumonia Vaccine (2 of 2 - PPSV23 or PCV20) 07/06/2014   Screening for Lung Cancer  03/22/2021   COVID-19 Vaccine (6 - 2023-24 season) 12/20/2021   Flu Shot  08/24/2022   Medicare Annual Wellness Visit  07/25/2023   DTaP/Tdap/Td vaccine (2 - Td or Tdap) 03/31/2024   DEXA scan (bone density measurement)  Completed   Hepatitis C Screening  Completed   Zoster (Shingles) Vaccine  Completed   HPV Vaccine  Aged Out   Colon Cancer Screening  Discontinued    Next appointment: Follow up in one year for your annual wellness visit.   Preventive Care 27 Years and Older, Female Preventive care refers to lifestyle choices and visits with your health care provider that can promote health and wellness. What does preventive care include? A yearly physical exam. This is also called an annual well check. Dental exams once or twice a year. Routine eye exams. Ask your health care provider how often you should have your eyes checked. Personal lifestyle choices, including: Daily care of your teeth and gums. Regular physical activity. Eating a healthy diet. Avoiding tobacco and drug use. Limiting alcohol use. Practicing safe sex. Taking low-dose aspirin every day. Taking vitamin and mineral supplements as recommended by your health care provider. What happens during an annual well check? The services and screenings done by your health care provider during your annual well check will depend on your age, overall health, lifestyle risk factors, and family history of disease. Counseling  Your health care provider may ask you questions about your: Alcohol  use. Tobacco use. Drug use. Emotional well-being. Home and relationship well-being. Sexual activity. Eating habits. History of falls. Memory and ability to understand (cognition). Work and work Astronomer. Reproductive health. Screening  You may have the following tests or measurements: Height, weight, and BMI. Blood pressure. Lipid and cholesterol levels. These may be checked every 5 years, or more frequently if you are over 91 years old. Skin check. Lung cancer screening. You may have this screening every year starting at age 9 if you have a 30-pack-year history of smoking and currently smoke or have quit within the past 15 years. Fecal occult blood test (FOBT) of the stool. You may have this test every year starting at age 92. Flexible sigmoidoscopy or colonoscopy. You may have a sigmoidoscopy every 5 years or a colonoscopy every 10 years starting at age 56. Hepatitis C blood test. Hepatitis B blood test. Sexually transmitted disease (STD) testing. Diabetes screening. This is done by checking your blood sugar (glucose) after you have not eaten for a while (fasting). You may have this done every 1-3 years. Bone density scan. This is done to screen for osteoporosis. You may have this done starting at age 19. Mammogram. This may be done every 1-2 years. Talk to your health care provider about how often you should have regular mammograms. Talk with your health care provider about your test results, treatment options, and if necessary, the need for more tests. Vaccines  Your health care provider may recommend certain vaccines, such as: Influenza vaccine. This is recommended every  year. Tetanus, diphtheria, and acellular pertussis (Tdap, Td) vaccine. You may need a Td booster every 10 years. Zoster vaccine. You may need this after age 63. Pneumococcal 13-valent conjugate (PCV13) vaccine. One dose is recommended after age 38. Pneumococcal polysaccharide (PPSV23) vaccine. One dose is  recommended after age 30. Talk to your health care provider about which screenings and vaccines you need and how often you need them. This information is not intended to replace advice given to you by your health care provider. Make sure you discuss any questions you have with your health care provider. Document Released: 02/05/2015 Document Revised: 09/29/2015 Document Reviewed: 11/10/2014 Elsevier Interactive Patient Education  2017 [employer] Prevention in the Home Falls can cause injuries. They can happen to people of all ages. There are many things you can do to make your home safe and to help prevent falls. What can I do on the outside of my home? Regularly fix the edges of walkways and driveways and fix any cracks. Remove anything that might make you trip as you walk through a door, such as a raised step or threshold. Trim any bushes or trees on the path to your home. Use bright outdoor lighting. Clear any walking paths of anything that might make someone trip, such as rocks or tools. Regularly check to see if handrails are loose or broken. Make sure that both sides of any steps have handrails. Any raised decks and porches should have guardrails on the edges. Have any leaves, snow, or ice cleared regularly. Use sand or salt on walking paths during winter. Clean up any spills in your garage right away. This includes oil or grease spills. What can I do in the bathroom? Use night lights. Install grab bars by the toilet and in the tub and shower. Do not use towel bars as grab bars. Use non-skid mats or decals in the tub or shower. If you need to sit down in the shower, use a plastic, non-slip stool. Keep the floor dry. Clean up any water that spills on the floor as soon as it happens. Remove soap buildup in the tub or shower regularly. Attach bath mats securely with double-sided non-slip rug tape. Do not have throw rugs and other things on the floor that can make you  trip. What can I do in the bedroom? Use night lights. Make sure that you have a light by your bed that is easy to reach. Do not use any sheets or blankets that are too big for your bed. They should not hang down onto the floor. Have a firm chair that has side arms. You can use this for support while you get dressed. Do not have throw rugs and other things on the floor that can make you trip. What can I do in the kitchen? Clean up any spills right away. Avoid walking on wet floors. Keep items that you use a lot in easy-to-reach places. If you need to reach something above you, use a strong step stool that has a grab bar. Keep electrical cords out of the way. Do not use floor polish or wax that makes floors slippery. If you must use wax, use non-skid floor wax. Do not have throw rugs and other things on the floor that can make you trip. What can I do with my stairs? Do not leave any items on the stairs. Make sure that there are handrails on both sides of the stairs and use them. Fix handrails that are broken or  loose. Make sure that handrails are as long as the stairways. Check any carpeting to make sure that it is firmly attached to the stairs. Fix any carpet that is loose or worn. Avoid having throw rugs at the top or bottom of the stairs. If you do have throw rugs, attach them to the floor with carpet tape. Make sure that you have a light switch at the top of the stairs and the bottom of the stairs. If you do not have them, ask someone to add them for you. What else can I do to help prevent falls? Wear shoes that: Do not have high heels. Have rubber bottoms. Are comfortable and fit you well. Are closed at the toe. Do not wear sandals. If you use a stepladder: Make sure that it is fully opened. Do not climb a closed stepladder. Make sure that both sides of the stepladder are locked into place. Ask someone to hold it for you, if possible. Clearly mark and make sure that you can  see: Any grab bars or handrails. First and last steps. Where the edge of each step is. Use tools that help you move around (mobility aids) if they are needed. These include: Canes. Walkers. Scooters. Crutches. Turn on the lights when you go into a dark area. Replace any light bulbs as soon as they burn out. Set up your furniture so you have a clear path. Avoid moving your furniture around. If any of your floors are uneven, fix them. If there are any pets around you, be aware of where they are. Review your medicines with your doctor. Some medicines can make you feel dizzy. This can increase your chance of falling. Ask your doctor what other things that you can do to help prevent falls. This information is not intended to replace advice given to you by your health care provider. Make sure you discuss any questions you have with your health care provider. Document Released: 11/05/2008 Document Revised: 06/17/2015 Document Reviewed: 02/13/2014 Elsevier Interactive Patient Education  2017 [employer].

## 2022-08-01 DIAGNOSIS — H3581 Retinal edema: Secondary | ICD-10-CM | POA: Diagnosis not present

## 2022-08-01 DIAGNOSIS — H34831 Tributary (branch) retinal vein occlusion, right eye, with macular edema: Secondary | ICD-10-CM | POA: Diagnosis not present

## 2022-08-01 DIAGNOSIS — Z961 Presence of intraocular lens: Secondary | ICD-10-CM | POA: Diagnosis not present

## 2022-08-01 DIAGNOSIS — H35033 Hypertensive retinopathy, bilateral: Secondary | ICD-10-CM | POA: Diagnosis not present

## 2022-08-02 DIAGNOSIS — H34831 Tributary (branch) retinal vein occlusion, right eye, with macular edema: Secondary | ICD-10-CM | POA: Diagnosis not present

## 2022-08-06 ENCOUNTER — Other Ambulatory Visit: Payer: Self-pay | Admitting: Family Medicine

## 2022-08-15 ENCOUNTER — Ambulatory Visit (INDEPENDENT_AMBULATORY_CARE_PROVIDER_SITE_OTHER): Payer: Medicare HMO | Admitting: Family Medicine

## 2022-08-15 VITALS — BP 124/82 | HR 90 | Temp 98.0°F | Resp 16 | Ht 67.0 in | Wt 132.4 lb

## 2022-08-15 DIAGNOSIS — I10 Essential (primary) hypertension: Secondary | ICD-10-CM

## 2022-08-15 DIAGNOSIS — J449 Chronic obstructive pulmonary disease, unspecified: Secondary | ICD-10-CM

## 2022-08-15 DIAGNOSIS — E559 Vitamin D deficiency, unspecified: Secondary | ICD-10-CM

## 2022-08-15 DIAGNOSIS — E782 Mixed hyperlipidemia: Secondary | ICD-10-CM

## 2022-08-15 DIAGNOSIS — M810 Age-related osteoporosis without current pathological fracture: Secondary | ICD-10-CM

## 2022-08-15 DIAGNOSIS — R002 Palpitations: Secondary | ICD-10-CM

## 2022-08-15 DIAGNOSIS — R739 Hyperglycemia, unspecified: Secondary | ICD-10-CM | POA: Diagnosis not present

## 2022-08-15 NOTE — Assessment & Plan Note (Signed)
Encourage heart healthy diet such as MIND or DASH diet, increase exercise, avoid trans fats, simple carbohydrates and processed foods, consider a krill or fish or flaxseed oil cap daily.  °

## 2022-08-15 NOTE — Assessment & Plan Note (Signed)
No recent exacerbation 

## 2022-08-15 NOTE — Patient Instructions (Signed)
 Tremor A tremor is trembling or shaking that you cannot control. Most tremors affect the hands or arms. Tremors can also affect the head, vocal cords, face, and other parts of the body. There are many types of tremors. Common types include: Essential tremor. These usually occur in people older than 40. This type of tremor may run in families and can happen in otherwise healthy people. Resting tremor. These occur when the muscles are at rest, such as when your hands are resting in your lap. People with Parkinson's disease often have resting tremors. Postural tremor. These occur when you try to hold a pose, such as keeping your hands outstretched. Kinetic tremor. These occur during purposeful movement, such as trying to touch a finger to your nose. Task-specific tremor. These may occur when you do certain tasks such as writing, speaking, or standing. Psychogenic tremor. These are greatly reduced or go away when you are distracted. These tremors happen due to underlying stress or psychiatric disease. They can happen in people of all ages. Some types of tremors have no known cause. Tremors can also be a symptom of nervous system problems (neurological disorders) that may occur with aging. Some tremors go away with treatment, while others do not. Follow these instructions at home: Lifestyle     If you drink alcohol: Limit how much you have to: 0-1 drink a day for women who are not pregnant. 0-2 drinks a day for men. Know how much alcohol is in a drink. In the [place] one drink equals one 12 oz bottle of beer (355 mL), one 5 oz glass of wine (148 mL), or one 1 oz glass of hard liquor (44 mL). Do not use any products that contain nicotine or tobacco. These products include cigarettes, chewing tobacco, and vaping devices, such as e-cigarettes. If you need help quitting, ask your health care provider. Avoid extreme heat and extreme cold. Limit your caffeine intake, as told by your health care  provider. Try to get 8 hours of sleep each night. Find ways to manage your stress, such as meditation or yoga. General instructions Take over-the-counter and prescription medicines only as told by your health care provider. Keep all follow-up visits. This is important. Contact a health care provider if: You develop a tremor after starting a new medicine. You have a tremor along with other symptoms such as: Numbness. Tingling. Pain. Weakness. Your tremor gets worse. Your tremor interferes with your day-to-day life. Summary A tremor is trembling or shaking that you cannot control. Most tremors affect the hands or arms. Some types of tremors have no known cause. Others may be a symptom of nervous system problems (neurological disorders). Make sure you discuss any tremors you have with your health care provider. This information is not intended to replace advice given to you by your health care provider. Make sure you discuss any questions you have with your health care provider. Document Revised: 10/29/2020 Document Reviewed: 10/29/2020 Elsevier Patient Education  2024 [employer].

## 2022-08-15 NOTE — Assessment & Plan Note (Signed)
Encouraged to get adequate exercise, calcium and vitamin d intake 

## 2022-08-15 NOTE — Assessment & Plan Note (Signed)
Well controlled, no changes to meds. Encouraged heart healthy diet such as the DASH diet and exercise as tolerated.  °

## 2022-08-15 NOTE — Assessment & Plan Note (Signed)
Supplement and monitor 

## 2022-08-15 NOTE — Progress Notes (Unsigned)
Subjective:    Patient ID: Suzanne Turner, female    DOB: 11/26/1945, 77 y.o.   MRN: [id number]  No chief complaint on file.   HPI Discussed the use of AI scribe software for clinical note transcription with the patient, who gave verbal consent to proceed.  History of Present Illness     Patient is a 77 yo female in today for follow up on chronic medical concerns. No recent febrile illness or hospitalizations. Denies CP/palp/SOB/HA/congestion/fevers/GI or GU c/o. Taking meds as prescribed        Past Medical History:  Diagnosis Date  . Abnormal thyroid blood test 01/28/2016  . Allergy    See attached list  . Bronchitis   . Cataract   . Chronic eczema   . Glaucoma    Left eye  . Glaucoma 07/28/2014   Left eye Dr Mckinley Jewel at [facility]  . H/O measles   . H/O mumps   . High cholesterol    no medications  . History of chicken pox   . Hypercalcemia 08/02/2014  . Hypertension   . Kidney stone   . 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 Osteoarthritis   . Osteopenia   . Preventative health care 01/30/2016  . Salivary duct stone   . Skin cancer   . Wears glasses     Past Surgical History:  Procedure Laterality Date  . CATARACT EXTRACTION Bilateral    May 31, 2021 and March 2023  . EYE SURGERY  Cataracts 2023  . ORIF TIBIA & FIBULA FRACTURES Right 1966  . REFRACTIVE SURGERY Left March/April 2018  . SALIVARY STONE REMOVAL Right 05/15/2012   Procedure: EXCISION OF RIGHT SUBMANDIBULAR GLAND ;  Surgeon: Serena Colonel, MD;  Location: [facility] OR;  Service: ENT;  Laterality: Right;  . SALIVARY STONE REMOVAL N/A 11/18/2012   Procedure: INTRAORAL EXCISION SUBMANDIBULAR STONE RIGHT ;  Surgeon: Serena Colonel, MD;  Location: [facility];  Service: ENT;  Laterality: N/A;  . SKIN BIOPSY Right 12/2012   legs  . TONSILLECTOMY  1949  . TUBAL LIGATION  1977    Family History  Problem Relation Age of  Onset  . Arrhythmia Mother   . Colon cancer Mother 13  . Cancer Mother   . Tremor Mother   . Heart disease Mother   . Arrhythmia Maternal Grandmother   . Glaucoma Maternal Grandmother   . Breast cancer Sister   . Cancer Sister        breast  . Glaucoma Sister   . Heart disease Father 54  . Arthritis Father        broken back with chronic pain  . Glaucoma Father   . Heart disease Brother   . Arrhythmia Brother   . Tremor Brother   . Hypertension Daughter   . Arrhythmia Maternal Uncle   . Heart disease Maternal Uncle   . Colon polyps Maternal Aunt     Social History   Socioeconomic History  . Marital status: Widowed    Spouse name: Not on file  . Number of children: Not on file  . Years of education: Not on file  . Highest education level: Not on file  Occupational History  . Not on file  Tobacco Use  . Smoking status: Former    Current packs/day: 0.00    Average packs/day: 0.5 packs/day for 40.0 years (20.0 ttl pk-yrs)    Types: Cigarettes  Start date: 04/15/1981    Quit date: 04/15/2021    Years since quitting: 1.3  . Smokeless tobacco: Never  . Tobacco comments:    Using nicotine lozenges as needed  Substance and Sexual Activity  . Alcohol use: No  . Drug use: No  . Sexual activity: Not Currently    Comment: lives alone, widowed, retired from Print production planner, education. and real estate, no dietary restrictions  Other Topics Concern  . Not on file  Social History Narrative  . Not on file   Social Determinants of Health   Financial Resource Strain: Low Risk  (07/21/2022)   Overall Financial Resource Strain (CARDIA)   . Difficulty of Paying Living Expenses: Not very hard  Food Insecurity: No Food Insecurity (07/21/2022)   Hunger Vital Sign   . Worried About Programme researcher, broadcasting/film/video in the Last Year: Never true   . Ran Out of Food in the Last Year: Never true  Transportation Needs: No Transportation Needs (07/21/2022)   PRAPARE - Transportation   . Lack of  Transportation (Medical): No   . Lack of Transportation (Non-Medical): No  Physical Activity: Insufficiently Active (07/21/2022)   Exercise Vital Sign   . Days of Exercise per Week: 2 days   . Minutes of Exercise per Session: 60 min  Stress: Stress Concern Present (07/21/2022)   [employer] of Occupational Health - Occupational Stress Questionnaire   . Feeling of Stress : To some extent  Social Connections: Unknown (07/21/2022)   Social Connection and Isolation Panel [NHANES]   . Frequency of Communication with Friends and Family: More than three times a week   . Frequency of Social Gatherings with Friends and Family: More than three times a week   . Attends Religious Services: Not on file   . Active Member of Clubs or Organizations: No   . Attends Banker Meetings: Never   . Marital Status: Widowed  Intimate Partner Violence: Not At Risk (07/15/2020)   Humiliation, Afraid, Rape, and Kick questionnaire   . Fear of Current or Ex-Partner: No   . Emotionally Abused: No   . Physically Abused: No   . Sexually Abused: No    Outpatient Medications Prior to Visit  Medication Sig Dispense Refill  . ABRYSVO 120 MCG/0.5ML injection     . acetaminophen (TYLENOL) 325 MG tablet Take 325 mg by mouth daily as needed for pain.    Marland Kitchen aspirin EC 81 MG tablet Take 81 mg by mouth daily.    . budesonide-formoterol (SYMBICORT) 80-4.5 MCG/ACT inhaler Inhale 2 puffs into the lungs 2 (two) times daily. 1 each 12  . Calcium Polycarbophil (FIBER-CAPS PO) Take 3 capsules by mouth in the morning and at bedtime.    . hydrochlorothiazide (HYDRODIURIL) 25 MG tablet TAKE 1/2 TABLET BY MOUTH THREE TIMES PER WEEK 19 tablet 1  . Multiple Vitamins-Minerals (HAIR/SKIN/NAILS/BIOTIN PO) Take 500 mg by mouth daily.    . potassium chloride SA (KLOR-CON M) 20 MEQ tablet TAKE 1 TABLET(20 MEQ) BY MOUTH DAILY 30 tablet 3  . VYZULTA 0.024 % SOLN Place 1 drop into both eyes at bedtime.     No facility-administered  medications prior to visit.    Allergies  Allergen Reactions  . Medrol [Methylprednisolone] Swelling and Other (See Comments)    Facial and lip swelling, but no breathing issues  . Alphagan [Brimonidine] Other (See Comments)    Eye irritation / eye fatigue  . Rhopressa [Netarsudil Dimesylate] Itching  . Zioptan [Tafluprost (Pf)] Other (  See Comments)    Eye irritation  . Amlodipine Other (See Comments)    Hip,legs,and feet feels achy and stiff  . Lisinopril Rash  . Losartan Anxiety  . Penicillins Rash  . Sulfonamide Derivatives Rash  . Timolol Rash    Review of Systems  Constitutional:  Negative for fever and malaise/fatigue.  HENT:  Negative for congestion.   Eyes:  Negative for blurred vision.  Respiratory:  Negative for shortness of breath.   Cardiovascular:  Negative for chest pain, palpitations and leg swelling.  Gastrointestinal:  Negative for abdominal pain, blood in stool and nausea.  Genitourinary:  Negative for dysuria and frequency.  Musculoskeletal:  Negative for falls.  Skin:  Negative for rash.  Neurological:  Negative for dizziness, loss of consciousness and headaches.  Endo/Heme/Allergies:  Negative for environmental allergies.  Psychiatric/Behavioral:  Negative for depression. The patient is not nervous/anxious.       Objective:    Physical Exam Constitutional:      General: She is not in acute distress.    Appearance: Normal appearance. She is well-developed. She is not toxic-appearing.  HENT:     Head: Normocephalic and atraumatic.     Right Ear: External ear normal.     Left Ear: External ear normal.     Nose: Nose normal.  Eyes:     General:        Right eye: No discharge.        Left eye: No discharge.     Conjunctiva/sclera: Conjunctivae normal.  Neck:     Thyroid: No thyromegaly.  Cardiovascular:     Rate and Rhythm: Normal rate and regular rhythm.     Heart sounds: Normal heart sounds. No murmur heard. Pulmonary:     Effort: Pulmonary  effort is normal. No respiratory distress.     Breath sounds: Normal breath sounds.  Abdominal:     General: Bowel sounds are normal.     Palpations: Abdomen is soft.     Tenderness: There is no abdominal tenderness. There is no guarding.  Musculoskeletal:        General: Normal range of motion.     Cervical back: Neck supple.  Lymphadenopathy:     Cervical: No cervical adenopathy.  Skin:    General: Skin is warm and dry.  Neurological:     Mental Status: She is alert and oriented to person, place, and time.  Psychiatric:        Mood and Affect: Mood normal.        Behavior: Behavior normal.        Thought Content: Thought content normal.        Judgment: Judgment normal.   There were no vitals taken for this visit. Wt Readings from Last 3 Encounters:  07/25/22 133 lb 3.2 oz (60.4 kg)  02/14/22 131 lb 6.4 oz (59.6 kg)  01/26/22 131 lb (59.4 kg)    Diabetic Foot Exam - Simple   No data filed    Lab Results  Component Value Date   WBC 4.9 08/02/2021   HGB 12.6 08/02/2021   HCT 36.9 08/02/2021   PLT 187.0 08/02/2021   GLUCOSE 61 (L) 06/15/2022   CHOL 208 (H) 08/02/2021   TRIG 118.0 08/02/2021   HDL 65.70 08/02/2021   LDLCALC 118 (H) 08/02/2021   ALT 12 06/15/2022   AST 21 06/15/2022   NA 135 06/15/2022   K 4.2 06/15/2022   CL 103 06/15/2022   CREATININE 0.85 06/15/2022   BUN  14 06/15/2022   CO2 25 06/15/2022   TSH 2.77 08/02/2021   INR 1.0 03/22/2020   HGBA1C 5.7 08/02/2021    Lab Results  Component Value Date   TSH 2.77 08/02/2021   Lab Results  Component Value Date   WBC 4.9 08/02/2021   HGB 12.6 08/02/2021   HCT 36.9 08/02/2021   MCV 92.8 08/02/2021   PLT 187.0 08/02/2021   Lab Results  Component Value Date   NA 135 06/15/2022   K 4.2 06/15/2022   CO2 25 06/15/2022   GLUCOSE 61 (L) 06/15/2022   BUN 14 06/15/2022   CREATININE 0.85 06/15/2022   BILITOT 0.8 06/15/2022   ALKPHOS 63 06/15/2022   AST 21 06/15/2022   ALT 12 06/15/2022   PROT  7.4 06/15/2022   ALBUMIN 4.2 06/15/2022   CALCIUM 9.7 06/15/2022   ANIONGAP 11 04/17/2021   GFR 66.23 06/15/2022   Lab Results  Component Value Date   CHOL 208 (H) 08/02/2021   Lab Results  Component Value Date   HDL 65.70 08/02/2021   Lab Results  Component Value Date   LDLCALC 118 (H) 08/02/2021   Lab Results  Component Value Date   TRIG 118.0 08/02/2021   Lab Results  Component Value Date   CHOLHDL 3 08/02/2021   Lab Results  Component Value Date   HGBA1C 5.7 08/02/2021       Assessment & Plan:  Vitamin D deficiency Assessment & Plan: Supplement and monitor   Essential hypertension Assessment & Plan: Well controlled, no changes to meds. Encouraged heart healthy diet such as the DASH diet and exercise as tolerated.    Osteoporosis, unspecified osteoporosis type, unspecified pathological fracture presence Assessment & Plan: Encouraged to get adequate exercise, calcium and vitamin d intake    Mixed hyperlipidemia Assessment & Plan: Encourage heart healthy diet such as MIND or DASH diet, increase exercise, avoid trans fats, simple carbohydrates and processed foods, consider a krill or fish or flaxseed oil cap daily.    Palpitations Assessment & Plan: No recent episodes   Chronic obstructive pulmonary disease, unspecified COPD type (HCC) Assessment & Plan: No recent exacerbation     Assessment and Plan              Danise Edge, MD

## 2022-08-15 NOTE — Assessment & Plan Note (Signed)
No recent episodes

## 2022-08-16 ENCOUNTER — Encounter: Payer: Self-pay | Admitting: Family Medicine

## 2022-08-16 LAB — CBC WITH DIFFERENTIAL/PLATELET
Basophils Absolute: 0 10*3/uL (ref 0.0–0.1)
Basophils Relative: 0.2 % (ref 0.0–3.0)
Eosinophils Absolute: 0.1 10*3/uL (ref 0.0–0.7)
Eosinophils Relative: 2.7 % (ref 0.0–5.0)
HCT: 38.3 % (ref 36.0–46.0)
Hemoglobin: 12.8 g/dL (ref 12.0–15.0)
Lymphocytes Relative: 7.4 % — ABNORMAL LOW (ref 12.0–46.0)
Lymphs Abs: 0.3 10*3/uL — ABNORMAL LOW (ref 0.7–4.0)
MCHC: 33.5 g/dL (ref 30.0–36.0)
MCV: 92.3 fl (ref 78.0–100.0)
Monocytes Absolute: 0.4 10*3/uL (ref 0.1–1.0)
Monocytes Relative: 9.2 % (ref 3.0–12.0)
Neutro Abs: 3.4 10*3/uL (ref 1.4–7.7)
Neutrophils Relative %: 80.5 % — ABNORMAL HIGH (ref 43.0–77.0)
Platelets: 238 10*3/uL (ref 150.0–400.0)
RBC: 4.15 Mil/uL (ref 3.87–5.11)
RDW: 13.9 % (ref 11.5–15.5)
WBC: 4.3 10*3/uL (ref 4.0–10.5)

## 2022-08-16 LAB — VITAMIN D 25 HYDROXY (VIT D DEFICIENCY, FRACTURES): VITD: 62.01 ng/mL (ref 30.00–100.00)

## 2022-08-16 LAB — COMPREHENSIVE METABOLIC PANEL
ALT: 12 U/L (ref 0–35)
AST: 24 U/L (ref 0–37)
Albumin: 4.4 g/dL (ref 3.5–5.2)
Alkaline Phosphatase: 58 U/L (ref 39–117)
BUN: 16 mg/dL (ref 6–23)
CO2: 25 mEq/L (ref 19–32)
Calcium: 10 mg/dL (ref 8.4–10.5)
Chloride: 97 mEq/L (ref 96–112)
Creatinine, Ser: 0.82 mg/dL (ref 0.40–1.20)
GFR: 69.06 mL/min (ref >60.00)
Glucose, Bld: 84 mg/dL (ref 70–99)
Potassium: 4 mEq/L (ref 3.5–5.1)
Sodium: 131 mEq/L — ABNORMAL LOW (ref 135–145)
Total Bilirubin: 0.7 mg/dL (ref 0.2–1.2)
Total Protein: 7.5 g/dL (ref 6.0–8.3)

## 2022-08-16 LAB — HEMOGLOBIN A1C: Hgb A1c MFr Bld: 5.8 % (ref 4.6–6.5)

## 2022-08-16 LAB — LIPID PANEL
Cholesterol: 201 mg/dL — ABNORMAL HIGH (ref 0–200)
HDL: 62.5 mg/dL (ref >39.00)
LDL Cholesterol: 112 mg/dL — ABNORMAL HIGH (ref 0–99)
NonHDL: 138.31
Total CHOL/HDL Ratio: 3
Triglycerides: 132 mg/dL (ref 0.0–149.0)
VLDL: 26.4 mg/dL (ref 0.0–40.0)

## 2022-08-16 LAB — TSH: TSH: 1.36 u[IU]/mL (ref 0.35–5.50)

## 2022-08-16 LAB — T4, FREE: Free T4: 0.78 ng/dL (ref 0.60–1.60)

## 2022-08-24 DIAGNOSIS — H5000 Unspecified esotropia: Secondary | ICD-10-CM | POA: Diagnosis not present

## 2022-08-24 DIAGNOSIS — H349 Unspecified retinal vascular occlusion: Secondary | ICD-10-CM | POA: Diagnosis not present

## 2022-08-24 DIAGNOSIS — H401131 Primary open-angle glaucoma, bilateral, mild stage: Secondary | ICD-10-CM | POA: Diagnosis not present

## 2022-10-13 ENCOUNTER — Other Ambulatory Visit: Payer: Self-pay | Admitting: Family Medicine

## 2022-10-25 ENCOUNTER — Encounter: Payer: Self-pay | Admitting: Family Medicine

## 2022-10-25 DIAGNOSIS — H34831 Tributary (branch) retinal vein occlusion, right eye, with macular edema: Secondary | ICD-10-CM | POA: Diagnosis not present

## 2022-10-28 IMAGING — CT CT ANGIO CHEST-ABD-PELV FOR DISSECTION W/ AND WO/W CM
2 of 7 series · 13 of 46 positions shown, 15 images · non-contrast
Comparison: 03/22/2020, 04/11/2013

CLINICAL DATA: Short of breath, hypertension, right back pain

EXAM:
CT ANGIOGRAPHY CHEST, ABDOMEN AND PELVIS
TECHNIQUE: Non-contrast CT of the chest was initially obtained.

[Series 7: dissection 2mm · axial · 0.64mm/px · z∈[+770,+1346]mm · 10 of 324 slices shown, 12 images]
[im 18/324  soft-tissue]
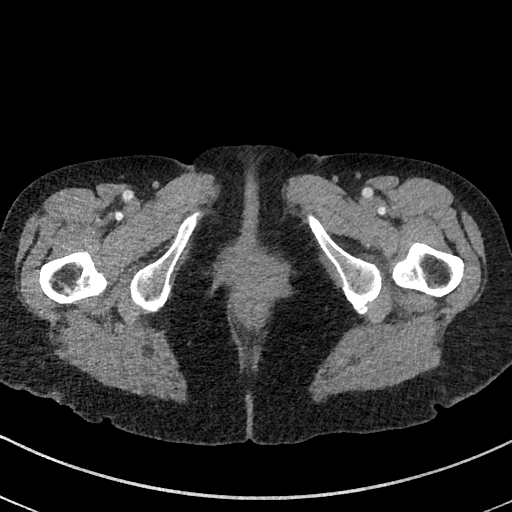
[im 18/324  bone]
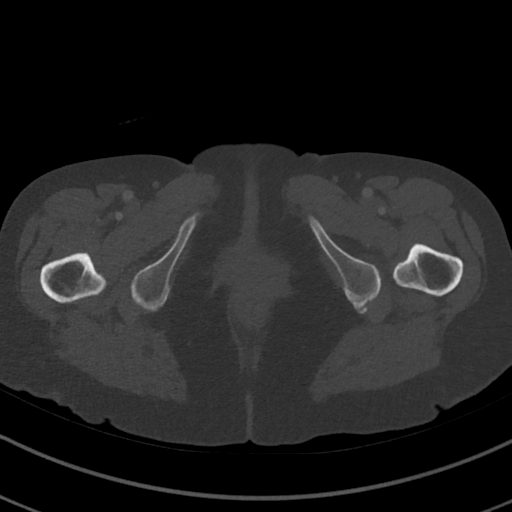
[im 54/324  soft-tissue]
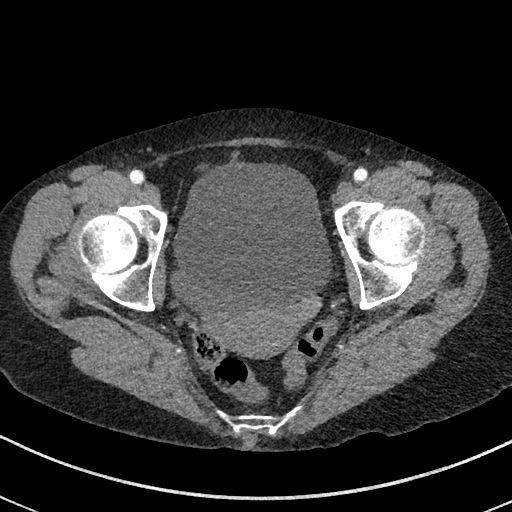
[im 90/324  soft-tissue]
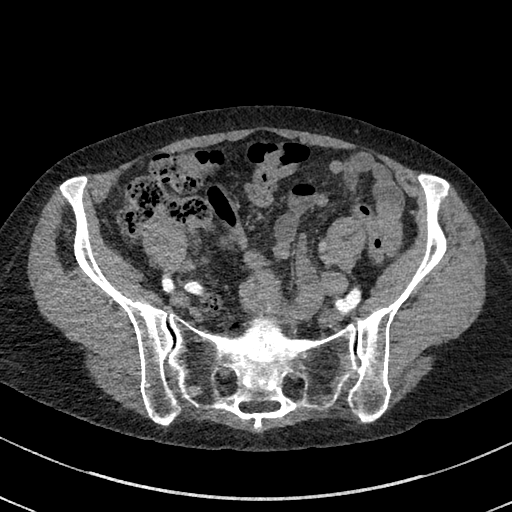
[im 108/324  soft-tissue]
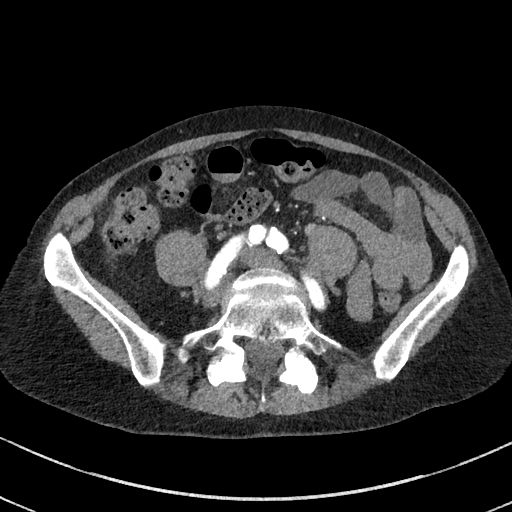
[im 144/324  soft-tissue]
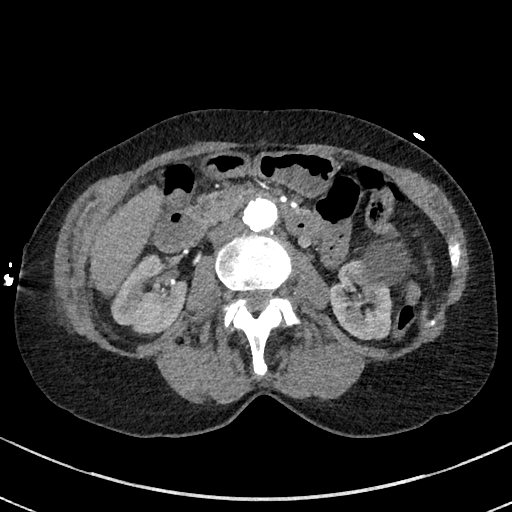
[im 180/324  soft-tissue]
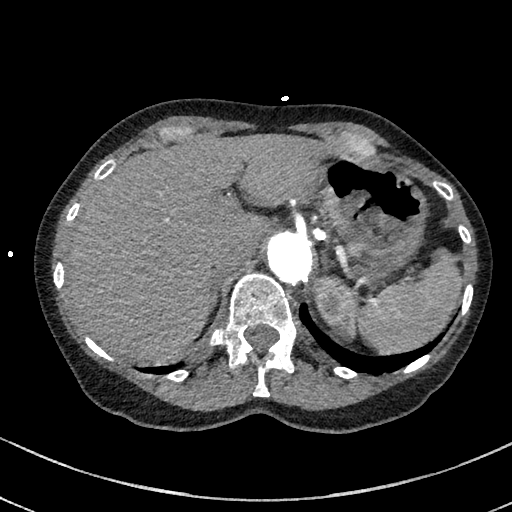
[im 216/324  soft-tissue]
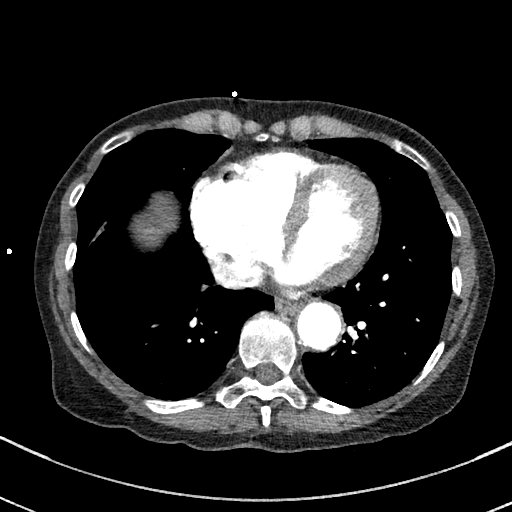
[im 234/324  soft-tissue]
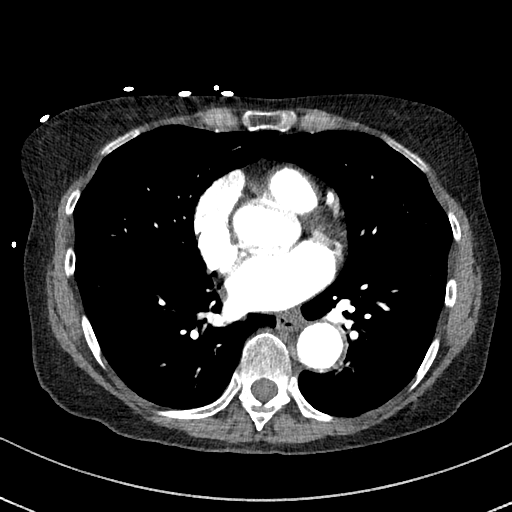
[im 270/324  soft-tissue]
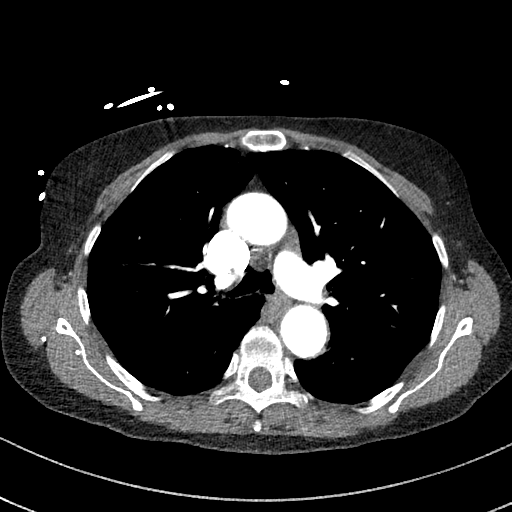
[im 270/324  bone]
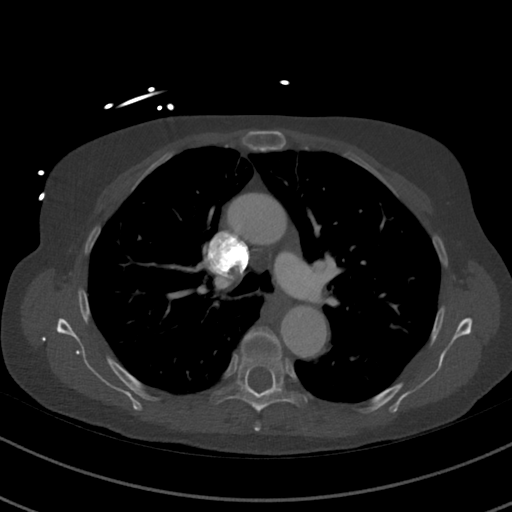
[im 306/324  soft-tissue]
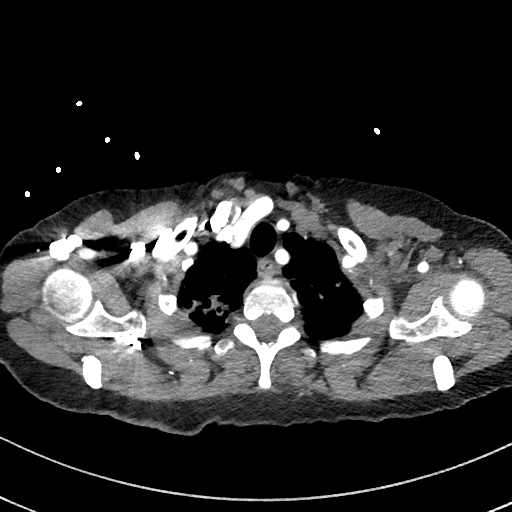

[Series 9: dissection 2mm cor · coronal · 0.69mm/px · 3 of 129 slices shown]
[im 33/129  soft-tissue]
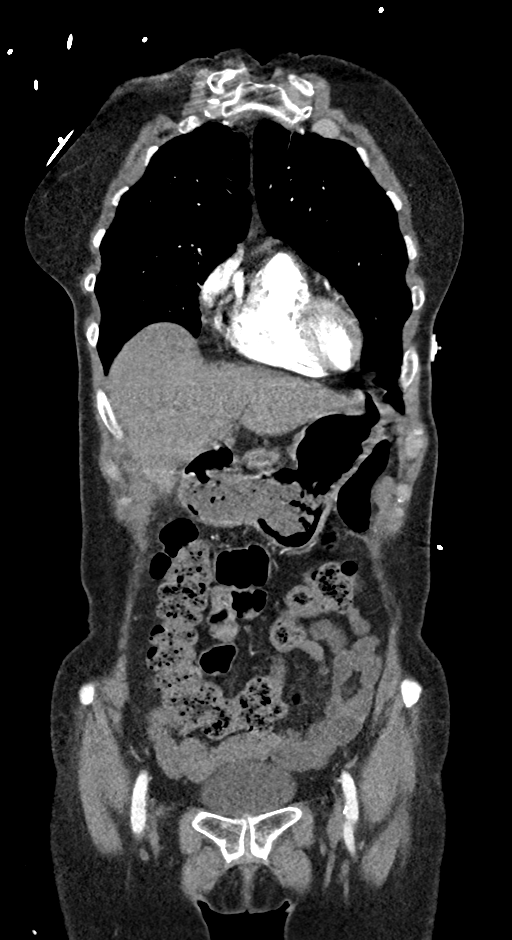
[im 65/129  soft-tissue]
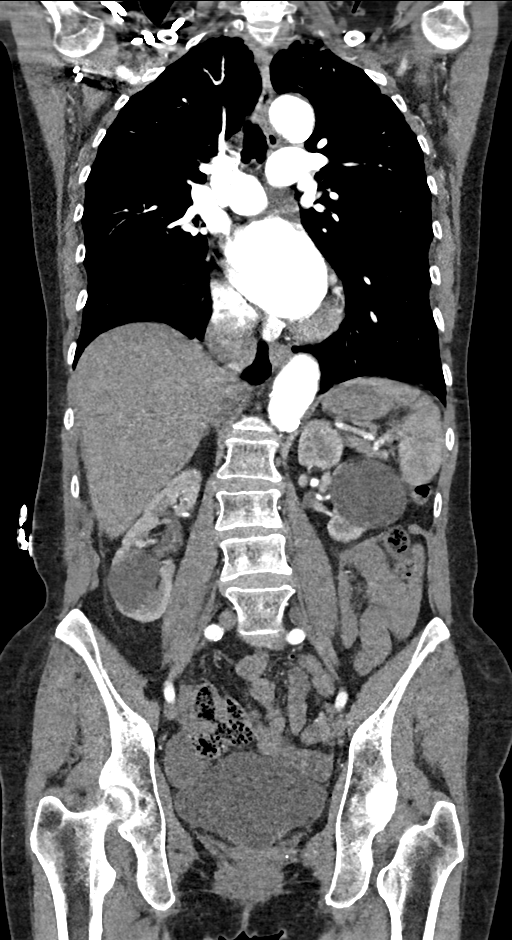
[im 97/129  soft-tissue]
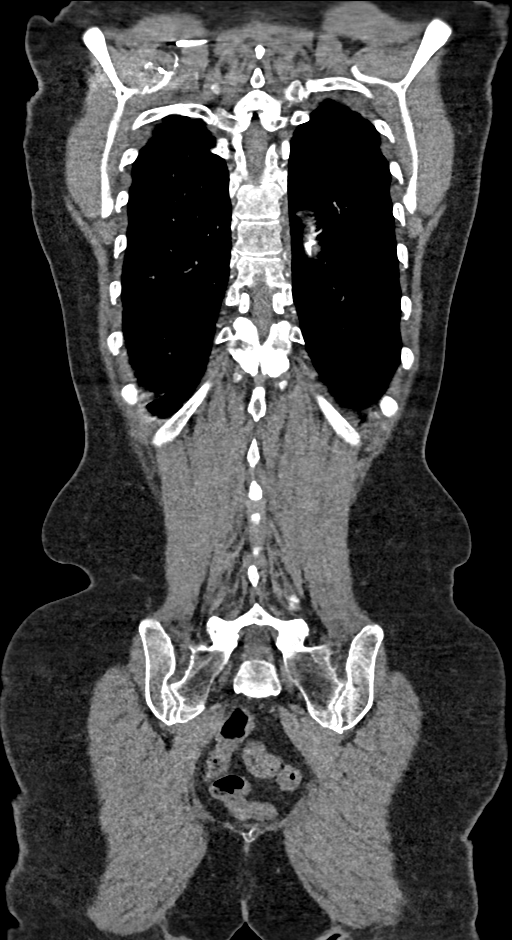

[13 of 46 positions shown; findings below may reference images not displayed]

Multidetector CT imaging through the chest, abdomen and pelvis was
performed using the standard protocol during bolus administration of
intravenous contrast. Multiplanar reconstructed images and MIPs were
obtained and reviewed to evaluate the vascular anatomy.

CONTRAST:  100mL OMNIPAQUE IOHEXOL 350 MG/ML SOLN
FINDINGS: CTA CHEST FINDINGS

Cardiovascular: No evidence of thoracic aortic aneurysm or
dissection. Moderate atherosclerosis of the descending thoracic
aorta.

The heart is unremarkable without pericardial effusion. No evidence
of pulmonary embolus.

Mediastinum/Nodes: Subcarinal adenopathy measures up to 16 mm in
short axis. No other pathologic lymph nodes are seen. Trachea,
thyroid, and esophagus are unremarkable.

Lungs/Pleura: Upper lobe predominant emphysema. Biapical pleural and
parenchymal scarring. No acute airspace disease, effusion, or
pneumothorax. Central airways are patent.

Musculoskeletal: No acute or destructive bony lesions. Reconstructed
images demonstrate no additional findings.

Review of the MIP images confirms the above findings.

CTA ABDOMEN AND PELVIS FINDINGS

VASCULAR

Aorta: Normal caliber aorta without aneurysm, dissection, vasculitis
or significant stenosis.

Celiac: Patent without evidence of aneurysm, dissection, vasculitis
or significant stenosis.

SMA: Patent without evidence of aneurysm, dissection, vasculitis or
significant stenosis. Incidental replaced right hepatic artery
arising from the SMA.

Renals: Both renal arteries are patent without evidence of aneurysm,
dissection, vasculitis, fibromuscular dysplasia or significant
stenosis.

IMA: Patent without evidence of aneurysm, dissection, vasculitis or
significant stenosis.

Inflow: Patent without evidence of aneurysm, dissection, vasculitis
or significant stenosis.

Veins: No obvious venous abnormality within the limitations of this
arterial phase study.

Review of the MIP images confirms the above findings.

NON-VASCULAR

Hepatobiliary: No focal liver abnormality is seen. No gallstones,
gallbladder wall thickening, or biliary dilatation.

Pancreas: Unremarkable. No pancreatic ductal dilatation or
surrounding inflammatory changes.

Spleen: Normal in size without focal abnormality.

Adrenals/Urinary Tract: Multiple bilateral renal cysts. No urinary
tract calculi or obstructive uropathy. Bladder is unremarkable. The
adrenals are normal.

Stomach/Bowel: No bowel obstruction or ileus. No bowel wall
thickening or inflammatory change.

Lymphatic: No pathologic adenopathy within the abdomen or pelvis.

Reproductive: Uterus and bilateral adnexa are unremarkable.

Other: No free fluid or free gas.  No abdominal wall hernia.

Musculoskeletal: No acute or destructive bony lesions. Reconstructed
images demonstrate no additional findings.

Review of the MIP images confirms the above findings.
IMPRESSION: 1. No evidence of thoracoabdominal aortic aneurysm or dissection.
2. No evidence of pulmonary embolus.
3. Nonspecific subcarinal adenopathy.
4. No acute intra-abdominal or intrapelvic process.
5. Aortic Atherosclerosis (XD90P-2WD.D) and Emphysema (XD90P-W0O.5).

## 2022-10-31 DIAGNOSIS — H5000 Unspecified esotropia: Secondary | ICD-10-CM | POA: Diagnosis not present

## 2022-10-31 DIAGNOSIS — H50011 Monocular esotropia, right eye: Secondary | ICD-10-CM | POA: Diagnosis not present

## 2022-11-13 ENCOUNTER — Encounter: Payer: Self-pay | Admitting: Pharmacist

## 2022-11-15 ENCOUNTER — Ambulatory Visit: Payer: Medicare HMO | Admitting: Pharmacist

## 2022-11-15 DIAGNOSIS — Z79899 Other long term (current) drug therapy: Secondary | ICD-10-CM

## 2022-11-15 NOTE — Progress Notes (Signed)
Pharmacy Note  11/15/2022 Name: Suzanne Turner MRN: [id number] DOB: 30-Jan-1945  Subjective: Suzanne Turner is a 77 y.o. year old female who is a primary care patient of Bradd Canary, MD. Clinical Pharmacist Practitioner referral was placed to assist with medication management  Engaged with patient by telephone for follow up visit today.  Medication Management: Patient previously was having difficulty with finances. Copay for Vyzulta was $95 per month. In January 2023 and 2024, we requested tier exception from [facility] with additional information on past therapies from Dr Randon Goldsmith office. [employer] exception was approved but I received a message from patient that cost was up again at $90.   Smoking Cessation: Has not smoked since 03/24/203 - smoke free for over a year! She does has some cravings. She is still using nicorette mini lozenges though.  Objective: Review of patient status, including review of consultants reports, laboratory and other test data, was performed as part of comprehensive evaluation and provision of chronic care management services.   Lab Results  Component Value Date   CREATININE 0.82 08/15/2022   CREATININE 0.85 06/15/2022   CREATININE 0.89 05/16/2022    Lab Results  Component Value Date   HGBA1C 5.8 08/15/2022       Component Value Date/Time   CHOL 201 (H) 08/15/2022 1433   TRIG 132.0 08/15/2022 1433   HDL 62.50 08/15/2022 1433   CHOLHDL 3 08/15/2022 1433   VLDL 26.4 08/15/2022 1433   LDLCALC 112 (H) 08/15/2022 1433   LDLCALC 126 (H) 10/20/2019 1006     Clinical ASCVD: No  The 10-year ASCVD risk score (Arnett DK, et al., 2019) is: 32.7%   Values used to calculate the score:     Age: 69 years     Sex: Female     Is Non-Hispanic African American: No     Diabetic: No     Tobacco smoker: Yes     Systolic Blood Pressure: 124 mmHg     Is BP treated: Yes     HDL Cholesterol: 62.5 mg/dL     Total Cholesterol: 201 mg/dL     Allergies  Allergen  Reactions   Medrol [Methylprednisolone] Swelling and Other (See Comments)    Facial and lip swelling, but no breathing issues   Alphagan [Brimonidine] Other (See Comments)    Eye irritation / eye fatigue   Carvedilol Diarrhea   Rhopressa [Netarsudil Dimesylate] Itching   Zioptan [Tafluprost (Pf)] Other (See Comments)    Eye irritation   Amlodipine Other (See Comments)    Hip,legs,and feet feels achy and stiff   Lisinopril Rash   Losartan Anxiety   Penicillins Rash   Sulfonamide Derivatives Rash   Timolol Rash    Medications Reviewed Today   Medications were not reviewed in this encounter     Patient Active Problem List   Diagnosis Date Noted   Tobacco abuse, 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   COPD (chronic obstructive pulmonary disease) (HCC) 08/02/2021   Constipation 08/02/2021   Preventative health care 11/10/2020   History of UTI 11/10/2020   Retinal vein occlusion of right eye 04/20/2020   Arthritis 10/20/2019   SCC (squamous cell carcinoma), leg, left 09/03/2017   Hyponatremia 03/05/2017   Counseling and coordination of care 01/30/2016   Abnormal thyroid blood test 01/28/2016   Other iron deficiency anemias 08/02/2014   Hypercalcemia 08/02/2014   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   History of chicken pox    Rash and nonspecific skin eruption 07/23/2013   At risk  for coronary artery disease 03/05/2013   Colon polyps 04/17/2011   Tremor 04/17/2011   Hyperlipidemia 03/29/2011   Palpitations 02/07/2011   Vitamin D deficiency 05/07/2008   Essential hypertension 05/07/2008   Osteoporosis 05/07/2008     Medication Assistance:   Tier exception approved for Vyzulta thru 01/23/2023 (lowered from $95/month to $47/month.   Assessment / Plan: Medication Management:  Called [employer]. They reported patient's tier exception is good thru 01/23/2023 and 30 day supply of Vyzulta should be $47 still.  Called [facility]  to check on how they were process claim. Per  Pharmacist, they were  processing in past incorrectly (2.48mL as 25 days supply) but he states per manufacturer 2.10mL should be 80 drops or 40 day supply, so patient is being charged 2 copays of $45 each = $90.  I called Vyzulta manufacturer and confirmed 2.35mL bottle contains 40 drops and 5mL bottle contains 160. 2.22mL cost would be $90 per fill and 50mL bottle $120 per fill.  Patient also states that she was told that she could only use Vyzulta for 30 days and then the potency would decrease. Reviewed package insert and once opened Vyzulta it is good for 8 weeks.   Plan to check January 2025 on formulary coverage of Vyzulta and complete tier exception if needed.   Smoking Cessation: Has not smoked since 04/15/2021 Great job with quitting smoking.  Continue to use nicotine lozenges as needed. Discussed trying to substitute something like sugar free candy or gum to try to ween off nicorette lozenges.   Follow Up:  Telephone follow up appointment with care management team member scheduled for:  January 2025 - check 2025 formulary and request copay exception for Vyzulta if still $95 per month.    Henrene Pastor, PharmD Clinical Pharmacist [facility]  - [facility] [phone]

## 2022-12-14 DIAGNOSIS — H5051 Esophoria: Secondary | ICD-10-CM | POA: Diagnosis not present

## 2022-12-14 DIAGNOSIS — H50011 Monocular esotropia, right eye: Secondary | ICD-10-CM | POA: Diagnosis not present

## 2022-12-14 DIAGNOSIS — H5000 Unspecified esotropia: Secondary | ICD-10-CM | POA: Diagnosis not present

## 2022-12-15 ENCOUNTER — Encounter: Payer: Self-pay | Admitting: Family Medicine

## 2022-12-15 ENCOUNTER — Other Ambulatory Visit: Payer: Self-pay | Admitting: Family Medicine

## 2022-12-24 NOTE — Assessment & Plan Note (Signed)
Encourage heart healthy diet such as MIND or DASH diet, increase exercise, avoid trans fats, simple carbohydrates and processed foods, consider a krill or fish or flaxseed oil cap daily.  °

## 2022-12-24 NOTE — Assessment & Plan Note (Signed)
No recent exacerbation 

## 2022-12-24 NOTE — Assessment & Plan Note (Signed)
Supplement and monitor 

## 2022-12-24 NOTE — Assessment & Plan Note (Signed)
Well controlled, no changes to meds. Encouraged heart healthy diet such as the DASH diet and exercise as tolerated.  °

## 2022-12-25 ENCOUNTER — Telehealth (INDEPENDENT_AMBULATORY_CARE_PROVIDER_SITE_OTHER): Payer: Medicare HMO | Admitting: Family Medicine

## 2022-12-25 ENCOUNTER — Encounter: Payer: Self-pay | Admitting: Family Medicine

## 2022-12-25 VITALS — BP 138/99 | HR 99

## 2022-12-25 DIAGNOSIS — R32 Unspecified urinary incontinence: Secondary | ICD-10-CM | POA: Diagnosis not present

## 2022-12-25 DIAGNOSIS — E782 Mixed hyperlipidemia: Secondary | ICD-10-CM

## 2022-12-25 DIAGNOSIS — I10 Essential (primary) hypertension: Secondary | ICD-10-CM

## 2022-12-25 DIAGNOSIS — E559 Vitamin D deficiency, unspecified: Secondary | ICD-10-CM | POA: Diagnosis not present

## 2022-12-25 DIAGNOSIS — J449 Chronic obstructive pulmonary disease, unspecified: Secondary | ICD-10-CM | POA: Diagnosis not present

## 2022-12-25 DIAGNOSIS — Z87440 Personal history of urinary (tract) infections: Secondary | ICD-10-CM

## 2022-12-25 NOTE — Progress Notes (Signed)
MyChart Video Visit    Virtual Visit via Video Note   This patient is at least at moderate risk for complications without adequate follow up. This format is felt to be most appropriate for this patient at this time. Physical exam was limited by quality of the video and audio technology used for the visit. Juanetta, CMA was able to get the patient set up on a video visit.  Patient location: home Patient and provider in visit Provider location: Office  I discussed the limitations of evaluation and management by telemedicine and the availability of in person appointments. The patient expressed understanding and agreed to proceed.  Visit Date: 12/25/2022  Today's healthcare provider: Danise Edge, MD     Subjective:    Patient ID: Suzanne Turner, female    DOB: 1946/01/16, 77 y.o.   MRN: [id number]  No chief complaint on file.   HPI Discussed the use of AI scribe software for clinical note transcription with the patient, who gave verbal consent to proceed.  History of Present Illness   The patient, with a history of urinary tract infections, presents with a one-month history of worsening urinary urgency and incontinence. The patient reports a constant urge to urinate, particularly after walking around, and has experienced episodes of unexpected incontinence. Over the past ten days, the patient has also noticed a sensation of a bulge in the vagina, which is not always present but frequent enough to cause alarm. The patient denies any fever, chills, burning sensation, or blood in the urine. The patient is currently taking cranberry tablets and probiotics for urinary health.        Past Medical History:  Diagnosis Date  . Abnormal thyroid blood test 01/28/2016  . Allergy    See attached list  . Bronchitis   . Cataract   . Chronic eczema   . Glaucoma    Left eye  . Glaucoma 07/28/2014   Left eye Dr Mckinley Jewel at [facility]  . H/O measles   . H/O mumps    . High cholesterol    no medications  . History of chicken pox   . Hypercalcemia 08/02/2014  . Hypertension   . Kidney stone   . 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   Left eye Dr Mckinley Jewel at [facility]   . Osteoarthritis   . Osteopenia   . Preventative health care 01/30/2016  . Salivary duct stone   . Skin cancer   . Wears glasses     Past Surgical History:  Procedure Laterality Date  . CATARACT EXTRACTION Bilateral    May 31, 2021 and March 2023  . EYE SURGERY  Cataracts 2023  . ORIF TIBIA & FIBULA FRACTURES Right 1966  . REFRACTIVE SURGERY Left March/April 2018  . SALIVARY STONE REMOVAL Right 05/15/2012   Procedure: EXCISION OF RIGHT SUBMANDIBULAR GLAND ;  Surgeon: Serena Colonel, MD;  Location: [facility] OR;  Service: ENT;  Laterality: Right;  . SALIVARY STONE REMOVAL N/A 11/18/2012   Procedure: INTRAORAL EXCISION SUBMANDIBULAR STONE RIGHT ;  Surgeon: Serena Colonel, MD;  Location: [facility];  Service: ENT;  Laterality: N/A;  . SKIN BIOPSY Right 12/2012   legs  . TONSILLECTOMY  1949  . TUBAL LIGATION  1977    Family History  Problem Relation Age of Onset  . Arrhythmia Mother   . Colon cancer Mother 57  . Cancer Mother   . Tremor Mother   . Heart disease Mother   . Arrhythmia Maternal Grandmother   .  Glaucoma Maternal Grandmother   . Breast cancer Sister   . Cancer Sister        breast  . Glaucoma Sister   . Heart disease Father 26  . Arthritis Father        broken back with chronic pain  . Glaucoma Father   . Heart disease Brother   . Arrhythmia Brother   . Tremor Brother   . Hypertension Daughter   . Arrhythmia Maternal Uncle   . Heart disease Maternal Uncle   . Colon polyps Maternal Aunt     Social History   Socioeconomic History  . Marital status: Widowed    Spouse name: Not on file  . Number of children: Not on file  . Years of education: Not on file  . Highest education level: Not on file  Occupational History  . Not on  file  Tobacco Use  . Smoking status: Former    Current packs/day: 0.00    Average packs/day: 0.5 packs/day for 40.0 years (20.0 ttl pk-yrs)    Types: Cigarettes    Start date: 04/15/1981    Quit date: 04/15/2021    Years since quitting: 1.6  . Smokeless tobacco: Never  . Tobacco comments:    Using nicotine lozenges as needed  Substance and Sexual Activity  . Alcohol use: No  . Drug use: No  . Sexual activity: Not Currently    Comment: lives alone, widowed, retired from Print production planner, education. and real estate, no dietary restrictions  Other Topics Concern  . Not on file  Social History Narrative  . Not on file   Social Determinants of Health   Financial Resource Strain: Low Risk  (07/21/2022)   Overall Financial Resource Strain (CARDIA)   . Difficulty of Paying Living Expenses: Not very hard  Food Insecurity: No Food Insecurity (07/21/2022)   Hunger Vital Sign   . Worried About Programme researcher, broadcasting/film/video in the Last Year: Never true   . Ran Out of Food in the Last Year: Never true  Transportation Needs: No Transportation Needs (07/21/2022)   PRAPARE - Transportation   . Lack of Transportation (Medical): No   . Lack of Transportation (Non-Medical): No  Physical Activity: Insufficiently Active (07/21/2022)   Exercise Vital Sign   . Days of Exercise per Week: 2 days   . Minutes of Exercise per Session: 60 min  Stress: Stress Concern Present (07/21/2022)   [employer] of Occupational Health - Occupational Stress Questionnaire   . Feeling of Stress : To some extent  Social Connections: Unknown (07/21/2022)   Social Connection and Isolation Panel [NHANES]   . Frequency of Communication with Friends and Family: More than three times a week   . Frequency of Social Gatherings with Friends and Family: More than three times a week   . Attends Religious Services: Not on file   . Active Member of Clubs or Organizations: No   . Attends Banker Meetings: Never   . Marital  Status: Widowed  Intimate Partner Violence: Not At Risk (07/15/2020)   Humiliation, Afraid, Rape, and Kick questionnaire   . Fear of Current or Ex-Partner: No   . Emotionally Abused: No   . Physically Abused: No   . Sexually Abused: No    Outpatient Medications Prior to Visit  Medication Sig Dispense Refill  . ABRYSVO 120 MCG/0.5ML injection     . acetaminophen (TYLENOL) 325 MG tablet Take 325 mg by mouth daily as needed for pain.    Marland Kitchen  aspirin EC 81 MG tablet Take 81 mg by mouth daily.    . budesonide-formoterol (SYMBICORT) 80-4.5 MCG/ACT inhaler Inhale 2 puffs into the lungs 2 (two) times daily. 1 each 12  . hydrochlorothiazide (HYDRODIURIL) 25 MG tablet TAKE 1/2 TABLET BY MOUTH 3 TIMES EVERY WEEK 19 tablet 1  . Multiple Vitamins-Minerals (HAIR/SKIN/NAILS/BIOTIN PO) Take 500 mg by mouth daily.    . potassium chloride SA (KLOR-CON M) 20 MEQ tablet TAKE 1 TABLET(20 MEQ) BY MOUTH DAILY 30 tablet 3  . VYZULTA 0.024 % SOLN Place 1 drop into both eyes at bedtime.     No facility-administered medications prior to visit.    Allergies  Allergen Reactions  . Medrol [Methylprednisolone] Swelling and Other (See Comments)    Facial and lip swelling, but no breathing issues  . Alphagan [Brimonidine] Other (See Comments)    Eye irritation / eye fatigue  . Carvedilol Diarrhea  . Rhopressa [Netarsudil Dimesylate] Itching  . Zioptan [Tafluprost (Pf)] Other (See Comments)    Eye irritation  . Amlodipine Other (See Comments)    Hip,legs,and feet feels achy and stiff  . Lisinopril Rash  . Losartan Anxiety  . Penicillins Rash  . Sulfonamide Derivatives Rash  . Timolol Rash    Review of Systems  Constitutional:  Negative for chills, fever and malaise/fatigue.  HENT:  Negative for congestion.   Eyes:  Negative for blurred vision.  Respiratory:  Negative for shortness of breath.   Cardiovascular:  Negative for chest pain, palpitations and leg swelling.  Gastrointestinal:  Negative for  abdominal pain, blood in stool and nausea.  Genitourinary:  Positive for frequency and urgency. Negative for dysuria, flank pain and hematuria.  Musculoskeletal:  Negative for falls.  Skin:  Negative for rash.  Neurological:  Negative for dizziness, loss of consciousness and headaches.  Endo/Heme/Allergies:  Negative for environmental allergies.  Psychiatric/Behavioral:  Negative for depression. The patient is not nervous/anxious.       Objective:    Physical Exam Constitutional:      General: She is not in acute distress.    Appearance: Normal appearance. She is not ill-appearing or toxic-appearing.  HENT:     Head: Normocephalic and atraumatic.     Right Ear: External ear normal.     Left Ear: External ear normal.     Nose: Nose normal.  Eyes:     General:        Right eye: No discharge.        Left eye: No discharge.  Pulmonary:     Effort: Pulmonary effort is normal.  Skin:    Findings: No rash.  Neurological:     Mental Status: She is alert and oriented to person, place, and time.  Psychiatric:        Behavior: Behavior normal.   BP (!) 138/99 Comment: Moving around prior to checking  Pulse 99  Wt Readings from Last 3 Encounters:  08/15/22 132 lb 6.4 oz (60.1 kg)  07/25/22 133 lb 3.2 oz (60.4 kg)  02/14/22 131 lb 6.4 oz (59.6 kg)       Assessment & Plan:  Essential hypertension Assessment & Plan: Well controlled, no changes to meds. Encouraged heart healthy diet such as the DASH diet and exercise as tolerated.   Orders: -     CBC with Differential/Platelet; Future -     Comprehensive metabolic panel; Future -     TSH; Future  Chronic obstructive pulmonary disease, unspecified COPD type (HCC) Assessment & Plan: No recent  exacerbation   Mixed hyperlipidemia Assessment & Plan: Encourage heart healthy diet such as MIND or DASH diet, increase exercise, avoid trans fats, simple carbohydrates and processed foods, consider a krill or fish or flaxseed oil cap  daily.   Orders: -     Lipid panel; Future  Vitamin D deficiency Assessment & Plan: Supplement and monitor  Orders: -     VITAMIN D 25 Hydroxy (Vit-D Deficiency, Fractures); Future  Urinary incontinence, unspecified type -     Urinalysis; Future -     Urine Culture; Future -     Ambulatory referral to Urogynecology; Future  History of UTI Assessment & Plan: Now with increased urgency and incontinence and a feeling of something bulging out of vagina at times. Referred to urogyn for evaluation of bladder prolapse. She will hydrate well add cranerry and probiotic tabs and check UA and cx      Assessment and Plan    Urinary Incontinence and Suspected Bladder Prolapse Recent onset of urinary urgency, incontinence, and sensation of vaginal bulge. No signs of infection such as fever, chills, or dysuria. Discussed the possibility of bladder prolapse due to weakening pelvic floor muscles with age. -Order urine culture to rule out urinary tract infection. -Refer to urogynecologist for further evaluation and management of suspected bladder prolapse. -Advise to continue cranberry tablets and probiotics for urinary health.  General Health Maintenance -Plan to conduct annual physical in early 2025. -Order routine blood work to monitor sodium levels and blood glucose.         I discussed the assessment and treatment plan with the patient. The patient was provided an opportunity to ask questions and all were answered. The patient agreed with the plan and demonstrated an understanding of the instructions.   The patient was advised to call back or seek an in-person evaluation if the symptoms worsen or if the condition fails to improve as anticipated.  Danise Edge, MD [facility] Primary [facility] at [facility] [phone] (phone) [phone] (fax)  [facility]

## 2022-12-25 NOTE — Assessment & Plan Note (Signed)
Now with increased urgency and incontinence and a feeling of something bulging out of vagina at times. Referred to urogyn for evaluation of bladder prolapse. She will hydrate well add cranerry and probiotic tabs and check UA and cx

## 2022-12-26 ENCOUNTER — Other Ambulatory Visit (INDEPENDENT_AMBULATORY_CARE_PROVIDER_SITE_OTHER): Payer: Medicare HMO

## 2022-12-26 DIAGNOSIS — Z85828 Personal history of other malignant neoplasm of skin: Secondary | ICD-10-CM | POA: Diagnosis not present

## 2022-12-26 DIAGNOSIS — E559 Vitamin D deficiency, unspecified: Secondary | ICD-10-CM

## 2022-12-26 DIAGNOSIS — R32 Unspecified urinary incontinence: Secondary | ICD-10-CM

## 2022-12-26 DIAGNOSIS — I10 Essential (primary) hypertension: Secondary | ICD-10-CM

## 2022-12-26 DIAGNOSIS — E782 Mixed hyperlipidemia: Secondary | ICD-10-CM

## 2022-12-26 DIAGNOSIS — L300 Nummular dermatitis: Secondary | ICD-10-CM | POA: Diagnosis not present

## 2022-12-26 DIAGNOSIS — D1801 Hemangioma of skin and subcutaneous tissue: Secondary | ICD-10-CM | POA: Diagnosis not present

## 2022-12-26 LAB — URINALYSIS, ROUTINE W REFLEX MICROSCOPIC
Bilirubin Urine: NEGATIVE
Hgb urine dipstick: NEGATIVE
Ketones, ur: NEGATIVE
Nitrite: NEGATIVE
RBC / HPF: NONE SEEN (ref 0–?)
Specific Gravity, Urine: 1.01 (ref 1.000–1.030)
Total Protein, Urine: NEGATIVE
Urine Glucose: NEGATIVE
Urobilinogen, UA: 0.2 (ref 0.0–1.0)
pH: 6 (ref 5.0–8.0)

## 2022-12-26 LAB — COMPREHENSIVE METABOLIC PANEL
ALT: 14 U/L (ref 0–35)
AST: 23 U/L (ref 0–37)
Albumin: 4.2 g/dL (ref 3.5–5.2)
Alkaline Phosphatase: 62 U/L (ref 39–117)
BUN: 18 mg/dL (ref 6–23)
CO2: 28 meq/L (ref 19–32)
Calcium: 9.5 mg/dL (ref 8.4–10.5)
Chloride: 99 meq/L (ref 96–112)
Creatinine, Ser: 0.88 mg/dL (ref 0.40–1.20)
GFR: 63.29 mL/min (ref >60.00)
Glucose, Bld: 82 mg/dL (ref 70–99)
Potassium: 4.2 meq/L (ref 3.5–5.1)
Sodium: 132 meq/L — ABNORMAL LOW (ref 135–145)
Total Bilirubin: 0.8 mg/dL (ref 0.2–1.2)
Total Protein: 7.7 g/dL (ref 6.0–8.3)

## 2022-12-26 LAB — VITAMIN D 25 HYDROXY (VIT D DEFICIENCY, FRACTURES): VITD: 61 ng/mL (ref 30.00–100.00)

## 2022-12-26 LAB — CBC WITH DIFFERENTIAL/PLATELET
Basophils Absolute: 0 10*3/uL (ref 0.0–0.1)
Basophils Relative: 0.8 % (ref 0.0–3.0)
Eosinophils Absolute: 0.1 10*3/uL (ref 0.0–0.7)
Eosinophils Relative: 3.4 % (ref 0.0–5.0)
HCT: 38.3 % (ref 36.0–46.0)
Hemoglobin: 13.1 g/dL (ref 12.0–15.0)
Lymphocytes Relative: 9.1 % — ABNORMAL LOW (ref 12.0–46.0)
Lymphs Abs: 0.3 10*3/uL — ABNORMAL LOW (ref 0.7–4.0)
MCHC: 34.1 g/dL (ref 30.0–36.0)
MCV: 92.2 fL (ref 78.0–100.0)
Monocytes Absolute: 0.4 10*3/uL (ref 0.1–1.0)
Monocytes Relative: 14.1 % — ABNORMAL HIGH (ref 3.0–12.0)
Neutro Abs: 2.2 10*3/uL (ref 1.4–7.7)
Neutrophils Relative %: 72.6 % (ref 43.0–77.0)
Platelets: 207 10*3/uL (ref 150.0–400.0)
RBC: 4.15 Mil/uL (ref 3.87–5.11)
RDW: 13.4 % (ref 11.5–15.5)
WBC: 3 10*3/uL — ABNORMAL LOW (ref 4.0–10.5)

## 2022-12-26 LAB — LIPID PANEL
Cholesterol: 175 mg/dL (ref 0–200)
HDL: 60.1 mg/dL (ref >39.00)
LDL Cholesterol: 98 mg/dL (ref 0–99)
NonHDL: 114.48
Total CHOL/HDL Ratio: 3
Triglycerides: 81 mg/dL (ref 0.0–149.0)
VLDL: 16.2 mg/dL (ref 0.0–40.0)

## 2022-12-26 LAB — TSH: TSH: 2.02 u[IU]/mL (ref 0.35–5.50)

## 2022-12-27 ENCOUNTER — Other Ambulatory Visit: Payer: Medicare HMO

## 2022-12-27 ENCOUNTER — Telehealth: Payer: Self-pay | Admitting: Family Medicine

## 2022-12-27 NOTE — Telephone Encounter (Signed)
Patient called and would like to get labs redrawn but at [facility] labs, please change order.

## 2022-12-28 ENCOUNTER — Other Ambulatory Visit: Payer: Self-pay | Admitting: Emergency Medicine

## 2022-12-28 DIAGNOSIS — I10 Essential (primary) hypertension: Secondary | ICD-10-CM

## 2022-12-28 LAB — URINE CULTURE
MICRO NUMBER:: 15807792
SPECIMEN QUALITY:: ADEQUATE

## 2022-12-29 NOTE — Telephone Encounter (Signed)
Labs were put in correctly and pt notified that she can go over to [employer]

## 2023-01-10 ENCOUNTER — Other Ambulatory Visit (INDEPENDENT_AMBULATORY_CARE_PROVIDER_SITE_OTHER): Payer: Medicare HMO

## 2023-01-10 ENCOUNTER — Encounter: Payer: Self-pay | Admitting: Pharmacist

## 2023-01-10 DIAGNOSIS — I10 Essential (primary) hypertension: Secondary | ICD-10-CM | POA: Diagnosis not present

## 2023-01-10 LAB — CBC WITH DIFFERENTIAL/PLATELET
Basophils Absolute: 0 10*3/uL (ref 0.0–0.1)
Basophils Relative: 0.8 % (ref 0.0–3.0)
Eosinophils Absolute: 0.1 10*3/uL (ref 0.0–0.7)
Eosinophils Relative: 3.5 % (ref 0.0–5.0)
HCT: 37.8 % (ref 36.0–46.0)
Hemoglobin: 12.9 g/dL (ref 12.0–15.0)
Lymphocytes Relative: 10.6 % — ABNORMAL LOW (ref 12.0–46.0)
Lymphs Abs: 0.4 10*3/uL — ABNORMAL LOW (ref 0.7–4.0)
MCHC: 34.2 g/dL (ref 30.0–36.0)
MCV: 92.1 fL (ref 78.0–100.0)
Monocytes Absolute: 0.6 10*3/uL (ref 0.1–1.0)
Monocytes Relative: 17.2 % — ABNORMAL HIGH (ref 3.0–12.0)
Neutro Abs: 2.3 10*3/uL (ref 1.4–7.7)
Neutrophils Relative %: 67.9 % (ref 43.0–77.0)
Platelets: 217 10*3/uL (ref 150.0–400.0)
RBC: 4.11 Mil/uL (ref 3.87–5.11)
RDW: 13.4 % (ref 11.5–15.5)
WBC: 3.3 10*3/uL — ABNORMAL LOW (ref 4.0–10.5)

## 2023-01-10 LAB — COMPREHENSIVE METABOLIC PANEL
ALT: 15 U/L (ref 0–35)
AST: 24 U/L (ref 0–37)
Albumin: 4.4 g/dL (ref 3.5–5.2)
Alkaline Phosphatase: 66 U/L (ref 39–117)
BUN: 15 mg/dL (ref 6–23)
CO2: 26 meq/L (ref 19–32)
Calcium: 9.4 mg/dL (ref 8.4–10.5)
Chloride: 101 meq/L (ref 96–112)
Creatinine, Ser: 0.92 mg/dL (ref 0.40–1.20)
GFR: 59.98 mL/min — ABNORMAL LOW (ref >60.00)
Glucose, Bld: 81 mg/dL (ref 70–99)
Potassium: 4.1 meq/L (ref 3.5–5.1)
Sodium: 133 meq/L — ABNORMAL LOW (ref 135–145)
Total Bilirubin: 0.8 mg/dL (ref 0.2–1.2)
Total Protein: 7.8 g/dL (ref 6.0–8.3)

## 2023-01-19 DIAGNOSIS — H34831 Tributary (branch) retinal vein occlusion, right eye, with macular edema: Secondary | ICD-10-CM | POA: Diagnosis not present

## 2023-01-26 ENCOUNTER — Telehealth: Payer: Medicare HMO

## 2023-02-14 ENCOUNTER — Ambulatory Visit: Payer: Medicare HMO | Admitting: Pharmacist

## 2023-02-14 DIAGNOSIS — Z79899 Other long term (current) drug therapy: Secondary | ICD-10-CM

## 2023-02-14 DIAGNOSIS — H4010X0 Unspecified open-angle glaucoma, stage unspecified: Secondary | ICD-10-CM

## 2023-02-14 NOTE — Progress Notes (Signed)
02/14/2023  Patient ID: Suzanne Turner, female   DOB: 1945/10/26, 78 y.o.   MRN: [id number]  Subjective: Suzanne Turner is a 78 y.o. year old female who is a primary care patient of Bradd Canary, MD. Clinical Pharmacist Practitioner referral was placed to assist with medication management.    Engaged with patient by telephone for follow up visit today.  Vyzulta 0.024% eye drops - use 1 drop in each eye at bedtime Patient reports Vyzulta tier 4 again for 2025. Cost is 29% of medication cost per month which would be about $99 / month).  In 2023 and 2024 we were able to get a tier exception due to allergy / intolerance to other agents for glaucoma.   Patient sees Dr Randon Goldsmith for glaucoma care.  Previous medications tried: latanoprost (ineffective), timolol (caused rash), Rhopressa (eye itching), Lumigan (ineffective); travoprost (ineffective); Alphagan - cause eye fatigue / eye irritation; Zioptan - caused eye irritation  Mrs. Puyear also mentions that her son died 3 weeks ago. He was relatively young and she was not expecting it. She does feel like she has been affected physically. Appetite has decreased. She denies sleeping changes.  She does report family and friend support. But states she is not ready to talk to counselor.   Objective: Review of patient status, including review of consultants reports, laboratory and other test data, was performed as part of comprehensive.  Lab Results  Component Value Date   CREATININE 0.92 01/10/2023   CREATININE 0.88 12/26/2022   CREATININE 0.82 08/15/2022    Lab Results  Component Value Date   HGBA1C 5.8 08/15/2022       Component Value Date/Time   CHOL 175 12/26/2022 1211   TRIG 81.0 12/26/2022 1211   HDL 60.10 12/26/2022 1211   CHOLHDL 3 12/26/2022 1211   VLDL 16.2 12/26/2022 1211   LDLCALC 98 12/26/2022 1211   LDLCALC 126 (H) 10/20/2019 1006     BP Readings from Last 3 Encounters:  12/25/22 (!) 138/99  08/15/22 124/82   07/25/22 (!) 152/84     Allergies  Allergen Reactions   Medrol [Methylprednisolone] Swelling and Other (See Comments)    Facial and lip swelling, but no breathing issues   Alphagan [Brimonidine] Other (See Comments)    Eye irritation / eye fatigue   Carvedilol Diarrhea   Rhopressa [Netarsudil Dimesylate] Itching   Zioptan [Tafluprost (Pf)] Other (See Comments)    Eye irritation   Amlodipine Other (See Comments)    Hip,legs,and feet feels achy and stiff   Lisinopril Rash   Losartan Anxiety   Penicillins Rash   Sulfonamide Derivatives Rash   Timolol Rash    Medications Reviewed Today     Reviewed by Henrene Pastor, RPH-CPP (Pharmacist) on 02/14/23 at 1045  Med List Status: <None>   Medication Order Taking? Sig Documenting Provider Last Dose Status Informant  acetaminophen (TYLENOL) 325 MG tablet [id number] Yes Take 325 mg by mouth daily as needed for pain. [provider] Taking Active Self  aspirin EC 81 MG tablet [id number] Yes Take 81 mg by mouth daily. [provider] Taking Active Self  hydrochlorothiazide (HYDRODIURIL) 25 MG tablet [id number] Yes TAKE 1/2 TABLET BY MOUTH 3 TIMES EVERY WEEK  Patient taking differently: TAKE 1/2 TABLET BY MOUTH 2 to 3 TIMES EVERY WEEK   Bradd Canary, MD Taking Active   Multiple Vitamins-Minerals (HAIR/SKIN/NAILS/BIOTIN PO) [id number]  Take 500 mg by mouth daily. [provider]  Active Self  potassium chloride SA (  KLOR-CON M) 20 MEQ tablet [id number] Yes TAKE 1 TABLET(20 MEQ) BY MOUTH DAILY Bradd Canary, MD Taking Active   VYZULTA 0.024 % SOLN [id number] Yes Place 1 drop into both eyes at bedtime. 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   Arthritis 10/20/2019   SCC (squamous cell carcinoma), leg, left 09/03/2017   Hyponatremia 03/05/2017   Counseling and coordination of care 01/30/2016   Abnormal thyroid blood test 01/28/2016   Other iron deficiency anemias 08/02/2014   Hypercalcemia 08/02/2014   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   History of chicken pox    Rash and nonspecific skin eruption 07/23/2013   At risk for coronary artery disease 03/05/2013   Colon polyps 04/17/2011   Tremor 04/17/2011   Hyperlipidemia 03/29/2011   Palpitations 02/07/2011   Vitamin D deficiency 05/07/2008   Essential hypertension 05/07/2008   Osteoporosis 05/07/2008     Medication Assistance:   [employer] today. Provided information for tier exception for 2025.  Reference #[id number] - decision could take up to 72 hours.    Assessment / Plan: Medication management:  Reviewed and updated medication list Reviewed refill history and adherence Contacted [employer] for tier exception.   Grieving - recent death of her son about 3 weeks ago Offered appointment with either PCP or other provider. Patient declined.  Discussed pharmacotherapy but she would prefer not to take anything at this time. Patient is reminded if she changes her mind to call the office for an appointment   Follow Up:  Telephone follow up appointment with care management team member scheduled for:  1 year    Henrene Pastor, PharmD Clinical Pharmacist [facility]  - [facility] [phone]

## 2023-02-20 ENCOUNTER — Encounter: Payer: Medicare HMO | Admitting: Family Medicine

## 2023-02-26 ENCOUNTER — Encounter: Payer: Self-pay | Admitting: Family Medicine

## 2023-02-28 DIAGNOSIS — Z961 Presence of intraocular lens: Secondary | ICD-10-CM | POA: Diagnosis not present

## 2023-02-28 DIAGNOSIS — H401131 Primary open-angle glaucoma, bilateral, mild stage: Secondary | ICD-10-CM | POA: Diagnosis not present

## 2023-02-28 DIAGNOSIS — H04123 Dry eye syndrome of bilateral lacrimal glands: Secondary | ICD-10-CM | POA: Diagnosis not present

## 2023-03-05 ENCOUNTER — Encounter: Payer: Self-pay | Admitting: Obstetrics

## 2023-03-05 ENCOUNTER — Encounter: Payer: Self-pay | Admitting: Family Medicine

## 2023-03-05 ENCOUNTER — Ambulatory Visit: Payer: Medicare HMO | Admitting: Obstetrics

## 2023-03-05 VITALS — BP 170/94 | HR 73 | Ht 66.0 in | Wt 127.6 lb

## 2023-03-05 DIAGNOSIS — N8110 Cystocele, unspecified: Secondary | ICD-10-CM | POA: Insufficient documentation

## 2023-03-05 DIAGNOSIS — R159 Full incontinence of feces: Secondary | ICD-10-CM | POA: Insufficient documentation

## 2023-03-05 DIAGNOSIS — N952 Postmenopausal atrophic vaginitis: Secondary | ICD-10-CM | POA: Insufficient documentation

## 2023-03-05 DIAGNOSIS — R339 Retention of urine, unspecified: Secondary | ICD-10-CM

## 2023-03-05 DIAGNOSIS — N3946 Mixed incontinence: Secondary | ICD-10-CM | POA: Insufficient documentation

## 2023-03-05 DIAGNOSIS — N3941 Urge incontinence: Secondary | ICD-10-CM | POA: Insufficient documentation

## 2023-03-05 LAB — POCT URINALYSIS DIPSTICK
Bilirubin, UA: NEGATIVE
Blood, UA: NEGATIVE
Glucose, UA: NEGATIVE
Ketones, UA: NEGATIVE
Leukocytes, UA: NEGATIVE
Nitrite, UA: NEGATIVE
Protein, UA: NEGATIVE
Spec Grav, UA: 1.015 (ref 1.010–1.025)
Urobilinogen, UA: 0.2 U/dL
pH, UA: 7 (ref 5.0–8.0)

## 2023-03-05 MED ORDER — ESTRADIOL 0.1 MG/GM VA CREA
0.5000 g | TOPICAL_CREAM | VAGINAL | 3 refills | Status: DC
Start: 2023-03-08 — End: 2023-04-21

## 2023-03-05 NOTE — Assessment & Plan Note (Signed)
-   reports dietary triggers such as diary and broccoli - encouraged FODMAP diet, IBGuard, and low dose fiber supplementation - pad use for fecal smearing - Treatment options include anti-diarrhea medication (loperamide/ Imodium OTC or prescription lomotil), fiber supplements, physical therapy, and possible sacral neuromodulation or surgery.

## 2023-03-05 NOTE — Assessment & Plan Note (Signed)
-   We discussed the symptoms of overactive bladder (OAB), which include urinary urgency, urinary frequency, nocturia, with or without urge incontinence.  While we do not know the exact etiology of OAB, several treatment options exist. We discussed management including behavioral therapy (decreasing bladder irritants, urge suppression strategies, timed voids, bladder retraining), physical therapy, medication; for refractory cases posterior tibial nerve stimulation, sacral neuromodulation, and intravesical botulinum toxin injection.  For anticholinergic medications, we discussed the potential side effects of anticholinergics including dry eyes, dry mouth, constipation, cognitive impairment and urinary retention. For Beta-3 agonist medication, we discussed the potential side effect of elevated blood pressure which is more likely to occur in individuals with uncontrolled hypertension. - cannot proceed with treatment until resolution of outlet obstruction - encouraged fluid management and Kegel exercises - reassess after pessary trial

## 2023-03-05 NOTE — Assessment & Plan Note (Signed)
-   irritation from pad use and history of UTI - For symptomatic vaginal atrophy options include lubrication with a water-based lubricant, personal hygiene measures and barrier protection against wetness, and estrogen replacement in the form of vaginal cream, vaginal tablets, or a time-released vaginal ring.   - encouraged barrier ointment use (Vaseline) and provided samples for moisturizers - Rx to start vaginal estrogen

## 2023-03-05 NOTE — Progress Notes (Signed)
 New Patient Evaluation and Consultation  Referring Provider: Neda Balk, MD PCP: Neda Balk, MD Date of Service: 03/05/2023  SUBJECTIVE Chief Complaint: New Patient (Initial Visit) Suzanne Turner is a 78 y.o. female here for a consult for urinary incontinence and prolapse.)  History of Present Illness: Suzanne Turner is a 79 y.o. White or Caucasian female seen in consultation at the request of Dr Rodrick Clapper for evaluation of urinary incontinence and pelvic organ prolapse.   Increased urinary frequency every 2-3 hours due to dry mouth since adulthood, worsened with hesitancy since vaginal bulge Reports large egg size vaginal bulge past vaginal opening started around 1 year ago in the shower  Denies falls, weight changes, new medications/surgeries Denies vaginal bleeding or discharge Self directed pelvic floor exercises with yoga Essential tremor runs in the family, reports exacerbation due to family trauma 11/2022  Review of records significant for: History of UTI, glaucoma, HTN  Urinary Symptoms: Leaks urine with going from sitting to standing, with a full bladder, with movement to the bathroom, with urgency, and without sensation Denies leakage with activity or exercises Leaks 1 time(s) per days with urgency Pad use: 2 liners/ mini-pads per day when she goes out, avoids at home due to irritation Patient is bothered by UI symptoms.  Day time voids 10.  Nocturia: 1 times per night to void. Denies leakage at night Voiding dysfunction:  does not empty bladder well.  Patient does not use a catheter to empty bladder.  When urinating, patient feels difficulty starting urine stream and the need to urinate multiple times in a row Drinks: >64oz water per day, 2 mugs of decaf coffee, 1 diet decaf `soda  UTIs:  0  UTI's in the last year.   Denies history of blood in urine, bladder cancer, and kidney cancer Kidney stones in 1986 spontaneously passed, denies surgical  intervention Reports kidney infection around 3 years ago No results found for the last 90 days.   Pelvic Organ Prolapse Symptoms:                  Patient Admits to a feeling of a bulge the vaginal area. It has been present for 6 months.  Patient Admits to seeing a bulge and worsened since onset This bulge is bothersome.  Bowel Symptom: Bowel movements: 1 time(s) per 2 days, around 3x/week with episodes of diarrhea Stool consistency: loose Straining: no.  Splinting: no.  Incomplete evacuation: yes.  Patient Admits to accidental bowel leakage / fecal incontinence for 2 years described as fecal smearing managed by panty liners  Occurs: 10 time(s) per day  Consistency with leakage: liquid when she has an upset stomach  Bowel regimen: diet and fiber, attributed to lactose intolerance  Tried citrucel fiber capsule in the past without relief and caused bloating Last colonoscopy: 2021, aged out per PCP documentation. Patient reports pending colonoscopy  HM Colonoscopy          Completed or No Longer Recommended     Colonoscopy  Discontinued      Frequency changed to Never automatically (Topic No Longer Applies)   08/15/2013  COLONOSCOPY   Only the first 1 history entries have been loaded, but more history exists.                Sexual Function Sexually active: no.  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    Left eye Dr Oris Birmingham at [facility]    Osteoarthritis    Osteopenia    Preventative health care 01/30/2016   Salivary duct stone    Skin cancer    Urinary incontinence    Vitamin D  deficiency    Wears glasses      Past Surgical History:   Past Surgical History:  Procedure Laterality Date   CATARACT EXTRACTION Bilateral    May 31, 2021 and March 2023   EYE SURGERY  Cataracts 2023   ORIF TIBIA & FIBULA FRACTURES Right 1966   REFRACTIVE SURGERY Left March/April 2018   SALIVARY STONE REMOVAL Right 05/15/2012   Procedure: EXCISION OF RIGHT SUBMANDIBULAR GLAND ;  Surgeon: Janita Mellow, MD;  Location: [facility] OR;  Service: ENT;  Laterality: Right;   SALIVARY STONE REMOVAL N/A 11/18/2012   Procedure: INTRAORAL EXCISION SUBMANDIBULAR STONE RIGHT ;  Surgeon: Janita Mellow, MD;  Location: [facility];  Service: ENT;  Laterality: N/A;   SKIN BIOPSY Right 12/2012   legs   TONSILLECTOMY  1949   TUBAL LIGATION  1977     Past OB/GYN History: OB History  Gravida Para Term Preterm AB Living  2 2 2   2   SAB IAB Ectopic Multiple Live Births      2    # Outcome Date GA Lbr Len/2nd Weight Sex Type Anes PTL Lv  2 Term     M Vag-Spont   LIV  1 Term     F Vag-Spont   LIV    Vaginal deliveries: 2, largest infant 8lbs 1oz Forceps/ Vacuum deliveries: 0, Cesarean section: 0 Menopausal: Yes, at age 89, Denies vaginal bleeding since menopause Contraception: BTL. Last pap smear was normal 04/01/2014, discontinued per chart review.  Any history of abnormal pap smears: no. No results found for: "DIAGPAP", "HPVHIGH", "ADEQPAP"  Medications: Patient has a current medication list which includes the following prescription(s): acetaminophen , aspirin  ec, [START ON 03/08/2023] estradiol , hydrochlorothiazide , multiple vitamins-minerals, potassium chloride  sa, and vyzulta.   Allergies: Patient is allergic to medrol  [methylprednisolone ], alphagan [brimonidine], carvedilol , rhopressa  [netarsudil dimesylate], zioptan [tafluprost (pf)], amlodipine , lisinopril , losartan , penicillins, sulfonamide derivatives, and timolol.   Social History:  Social History   Tobacco Use   Smoking status: Former    Current packs/day: 0.00    Average packs/day: 0.5 packs/day for 40.0 years (20.0 ttl pk-yrs)    Types: Cigarettes    Start date: 04/15/1981    Quit date: 04/15/2021    Years since quitting: 1.8   Smokeless tobacco: Never   Tobacco comments:    Using nicotine lozenges as needed  Vaping Use   Vaping status: Never Used  Substance Use Topics   Alcohol use: No   Drug use: No    Relationship status: widowed Patient lives by herself.   Patient is not employed. Regular exercise: Yes: yoga History of abuse: No  Family History:  Family History  Problem Relation Age of Onset   Arrhythmia Mother    Colon cancer Mother 60   Cancer Mother    Tremor Mother    Heart disease Mother    Heart disease Father 64   Arthritis Father        broken back with chronic pain   Glaucoma Father    Breast cancer Sister    Cancer Sister 28       breast   Glaucoma Sister    Heart disease Brother    Arrhythmia Brother    Tremor Brother    Arrhythmia Maternal Grandmother    Glaucoma Maternal Grandmother    Hypertension Daughter    Colon polyps Maternal Aunt    Arrhythmia Maternal Uncle    Heart disease Maternal Uncle    Bladder Cancer Neg Hx    Uterine cancer Neg Hx      Review of Systems: Review of Systems  Constitutional:  Negative for fever, malaise/fatigue and weight loss.  Respiratory:  Negative for cough, shortness of breath and wheezing.   Cardiovascular:  Negative for chest pain, palpitations and leg swelling.  Gastrointestinal:  Negative for abdominal pain and blood in stool.  Genitourinary:  Positive for frequency and urgency. Negative for dysuria and hematuria.       Leakage  Skin:  Negative for rash.  Neurological:  Negative for dizziness, weakness and headaches.   Endo/Heme/Allergies:  Does not bruise/bleed easily.  Psychiatric/Behavioral:  Negative for depression. The patient is nervous/anxious.      OBJECTIVE Physical Exam: Vitals:   03/05/23 0802  BP: (!) 170/94  Pulse: 73  Weight: 127 lb 9.6 oz (57.9 kg)  Height: 5\' 6"  (1.676 m)    Physical Exam Constitutional:      General: She is not in acute distress.    Appearance: Normal appearance.  Genitourinary:     Bladder and urethral meatus normal.     No lesions in the vagina.     Genitourinary Comments: 4-75mm ulceration on anterior lip of cervix, no bleeding or erythema     Right Labia: No rash, tenderness, lesions, skin changes or Bartholin's cyst.    Left Labia: No tenderness, lesions, skin changes, Bartholin's cyst or rash.    Vaginal ulceration present.     No vaginal discharge, erythema, tenderness, bleeding or granulation tissue.     Apical vaginal prolapse present.    Moderate vaginal atrophy present.     Right Adnexa: not tender, not full and no mass present.    Left Adnexa: not tender, not full and no mass present.    No cervical motion tenderness, discharge, friability, lesion, polyp or nabothian cyst.        Uterus is prolapsed.     Uterus is not enlarged, fixed, tender or irregular.     No uterine mass detected.    Urethral meatus caruncle not present.    No urethral prolapse, tenderness, mass, hypermobility, discharge or stress urinary incontinence with cough stress test present.     Bladder is not tender, urgency on palpation not present and masses not present.      Pelvic Floor: Levator muscle strength is 4/5.    Levator ani not tender, obturator internus not tender, no asymmetrical contractions present and no pelvic spasms present.    Symmetrical pelvic sensation, anal wink present and BC reflex present. Cardiovascular:     Rate and Rhythm: Normal rate.  Pulmonary:     Effort: Pulmonary effort is normal. No respiratory  distress.  Abdominal:     General: There  is no distension.     Palpations: Abdomen is soft. There is no mass.     Tenderness: There is no abdominal tenderness.     Hernia: No hernia is present.    Neurological:     Mental Status: She is alert.  Vitals reviewed. Exam conducted with a chaperone present.      POP-Q:   POP-Q  3                                            Aa   3                                           Ba  1                                              C   5                                            Gh  1                                            Pb  7                                            tvl   -1                                            Ap  -1                                            Bp  0                                              D    Post-Void Residual (PVR) by Bladder Scan: In order to evaluate bladder emptying, we discussed obtaining a postvoid residual and patient agreed to this procedure.  Procedure: The ultrasound unit was placed on the patient's abdomen in the suprapubic region after the patient had voided.    Post Void Residual - 03/05/23 0826       Post Void Residual   Post Void Residual 420 mL            Straight Catheterization Procedure for PVR: After verbal consent was obtained from the patient for catheterization to assess bladder emptying and residual volume  the urethra and surrounding tissues were prepped with betadine and an in and out catheterization was performed.  PVR was .  Urine appeared clear yellow. The patient tolerated the procedure well.   Laboratory Results: Lab Results  Component Value Date   COLORU Yellow 03/05/2023   CLARITYU Clear 03/05/2023   GLUCOSEUR Negative 03/05/2023   BILIRUBINUR Negative 03/05/2023   KETONESU Negative 03/05/2023   SPECGRAV 1.015 03/05/2023   RBCUR Negative 03/05/2023   PHUR 7.0 03/05/2023   PROTEINUR Negative 03/05/2023   UROBILINOGEN 0.2 03/05/2023   LEUKOCYTESUR Negative 03/05/2023    Lab  Results  Component Value Date   CREATININE 0.92 01/10/2023   CREATININE 0.88 12/26/2022   CREATININE 0.82 08/15/2022    Lab Results  Component Value Date   HGBA1C 5.8 08/15/2022    Lab Results  Component Value Date   HGB 12.9 01/10/2023     ASSESSMENT AND PLAN Ms. Stinchfield is a 78 y.o. with:  1. Urinary incontinence, urge   2. Incontinence of feces, unspecified fecal incontinence type   3. Incomplete bladder emptying   4. 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 Vaginal atrophy     Urinary incontinence, urge Assessment & Plan: - We discussed the symptoms of overactive bladder (OAB), which include urinary urgency, urinary frequency, nocturia, with or without urge incontinence.  While we do not know the exact etiology of OAB, several treatment options exist. We discussed management including behavioral therapy (decreasing bladder irritants, urge suppression strategies, timed voids, bladder retraining), physical therapy, medication; for refractory cases posterior tibial nerve stimulation, sacral neuromodulation, and intravesical botulinum toxin injection.  For anticholinergic medications, we discussed the potential side effects of anticholinergics including dry eyes, dry mouth, constipation, cognitive impairment and urinary retention. For Beta-3 agonist medication, we discussed the potential side effect of elevated blood pressure which is more likely to occur in individuals with uncontrolled hypertension. - cannot proceed with treatment until resolution of outlet obstruction - encouraged fluid management and Kegel exercises - reassess after pessary trial  Orders: -     POCT urinalysis dipstick -     Estradiol ; Place 0.5 g vaginally 2 (two) times a week. Place 0.5g nightly for two weeks then twice a week after  Dispense: 30 g; Refill: 3  Incontinence of feces, unspecified fecal incontinence type Assessment & Plan: - reports dietary triggers such as diary and  broccoli - encouraged FODMAP diet, IBGuard, and low dose fiber supplementation - pad use for fecal smearing - Treatment options include anti-diarrhea medication (loperamide/ Imodium OTC or prescription lomotil), fiber supplements, physical therapy, and possible sacral neuromodulation or surgery.     Incomplete bladder emptying Assessment & Plan: - bladder scan elevated, catheterized for - UA negative - likely due to outlet obstruction from stage III pelvic organ prolapse - encouraged splinting  - reviewed treatment options, patient desires to avoid surgical intervention and return for pessary trial  Orders: -     Estradiol ; Place 0.5 g vaginally 2 (two) times a week. 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 - patient desires to avoid surgical intervention - encouraged splinting and return for pessary trial, reviewed instructions  for Kegels  - reviewed vaginal closure procedure since pt is not sexually active as option if pessary fails - pros - strong, good long-term success - cons - unable to have penetrative intercourse  Orders: -     Estradiol ; Place 0.5 g vaginally 2 (two) times a week. Place 0.5g nightly for two weeks then twice a week after  Dispense: 30 g; Refill: 3  Vaginal atrophy Assessment & Plan: - irritation from pad use and history of UTI - For symptomatic vaginal atrophy options include lubrication with a water-based lubricant, personal hygiene measures and barrier protection against wetness, and estrogen replacement in the form of vaginal cream, vaginal tablets, or a  time-released vaginal ring.   - encouraged barrier ointment use (Vaseline) and provided samples for moisturizers - Rx to start vaginal estrogen  Orders: -     Estradiol ; Place 0.5 g vaginally 2 (two) times a week. Place 0.5g nightly for two weeks then twice a week after  Dispense: 30 g; Refill: 3   Darlene Ehlers, MD

## 2023-03-05 NOTE — Assessment & Plan Note (Addendum)
-   incomplete bladder emptying - For treatment of pelvic organ prolapse, we discussed options for management including expectant management, conservative management, and surgical management, such as Kegels, a pessary, pelvic floor physical therapy, and specific surgical procedures. - patient desires to avoid surgical intervention - encouraged splinting and return for pessary trial, reviewed instructions for Kegels  - reviewed vaginal closure procedure since pt is not sexually active as option if pessary fails - pros - strong, good long-term success - cons - unable to have penetrative intercourse

## 2023-03-05 NOTE — Assessment & Plan Note (Addendum)
-   bladder scan elevated, catheterized for - UA negative - likely due to outlet obstruction from stage III pelvic organ prolapse - encouraged splinting  - reviewed treatment options, patient desires to avoid surgical intervention and return for pessary trial

## 2023-03-05 NOTE — Patient Instructions (Addendum)
  You have a stage 3 (out of 4) prolapse.  We discussed the fact that it is not life threatening but there are several treatment options. For treatment of pelvic organ prolapse, we discussed options for management including expectant management, conservative management, and surgical management, such as Kegels, a pessary, pelvic floor physical therapy, and specific surgical procedures.     Please start splinting or gently pushing your vaginal bulge into the vagina to improve your bladder emptying and return for pessary fitting.  For symptomatic vaginal atrophy options include lubrication with a water-based lubricant, personal hygiene measures and barrier protection against wetness, and estrogen replacement in the form of vaginal cream, vaginal tablets, or a time-released vaginal ring.     For vaginal atrophy (thinning of the vaginal tissue that can cause dryness and burning) and UTI prevention we discussed estrogen replacement in the form of vaginal cream.   Start vaginal estrogen therapy nightly for two weeks then 2 times weekly at night. This can be placed with your finger or an applicator inside the vagina and around the urethra.  Please let us  know if the prescription is too expensive and we can look for alternative options.   Is vaginal estrogen therapy safe for me? Vaginal estrogen preparations act on the vaginal skin, and only a very tiny amount is absorbed into the bloodstream (0.01%).  They work in a similar way to hand or face cream.  There is minimal absorption and they are therefore perfectly safe. If you have had breast cancer and have persistent troublesome symptoms which aren't settling with vaginal moisturisers and lubricants, local estrogen treatment may be a possibility, but consultation with your oncologist should take place first.   We discussed the symptoms of overactive bladder (OAB), which include urinary urgency, urinary frequency, night-time urination, with or without urge  incontinence.  We discussed management including behavioral therapy (decreasing bladder irritants by following a bladder diet, urge suppression strategies, timed voids, bladder retraining), physical therapy, medication; and for refractory cases posterior tibial nerve stimulation, sacral neuromodulation, and intravesical botulinum toxin injection.   Accidental Bowel Leakage:  - Treatment options include anti-diarrhea medication (loperamide/ Imodium OTC or prescription lomotil), fiber supplements, physical therapy, and possible sacral neuromodulation or surgery.

## 2023-03-06 DIAGNOSIS — H50011 Monocular esotropia, right eye: Secondary | ICD-10-CM | POA: Diagnosis not present

## 2023-03-06 DIAGNOSIS — H5000 Unspecified esotropia: Secondary | ICD-10-CM | POA: Diagnosis not present

## 2023-03-07 NOTE — Telephone Encounter (Signed)
I checked the historical immunization on the chart and it has already been abstracted

## 2023-03-27 ENCOUNTER — Encounter: Payer: Self-pay | Admitting: Obstetrics and Gynecology

## 2023-03-27 ENCOUNTER — Ambulatory Visit: Payer: Medicare HMO | Admitting: Obstetrics and Gynecology

## 2023-03-27 ENCOUNTER — Other Ambulatory Visit (HOSPITAL_COMMUNITY)
Admission: RE | Admit: 2023-03-27 | Discharge: 2023-03-27 | Disposition: A | Discharge status: Home / Self Care | Source: Ambulatory Visit | Attending: Obstetrics and Gynecology | Admitting: Obstetrics and Gynecology

## 2023-03-27 VITALS — BP 143/86 | HR 82

## 2023-03-27 DIAGNOSIS — N898 Other specified noninflammatory disorders of vagina: Secondary | ICD-10-CM

## 2023-03-27 DIAGNOSIS — N813 Complete uterovaginal prolapse: Secondary | ICD-10-CM

## 2023-03-27 DIAGNOSIS — R339 Retention of urine, unspecified: Secondary | ICD-10-CM

## 2023-03-27 DIAGNOSIS — N8110 Cystocele, unspecified: Secondary | ICD-10-CM

## 2023-03-27 NOTE — Progress Notes (Signed)
 Suzanne Turner   Subjective:     Chief Complaint: Pessary Check Suzanne Turner is a 78 y.o. female is here for pessary check.)  History of Present Illness: Suzanne Turner is a 78 y.o. female with stage III pelvic organ prolapse who presents today for a pessary fitting.    Past Medical History: Patient  has a past medical history of Abnormal thyroid blood test (01/28/2016), Allergy, Bronchitis, Cataract, Chronic eczema, COPD (chronic obstructive pulmonary disease) (HCC), Glaucoma (07/28/2014), H/O measles, H/O mumps, High cholesterol, History of chicken pox, Hypercalcemia (08/02/2014), Hypertension, Kidney stone, Nummular dermatitis, Open-angle glaucoma (07/28/2014), Osteoarthritis, Osteopenia, Preventative health care (01/30/2016), Salivary duct stone, Skin cancer, Urinary incontinence, Vitamin D deficiency, and Wears glasses.   Past Surgical History: She  has a past surgical history that includes Tonsillectomy (1949); Tubal ligation (1977); Salivary stone removal (Right, 05/15/2012); ORIF tibia & fibula fractures (Right, 1966); Salivary stone removal (N/A, 11/18/2012); Skin biopsy (Right, 12/2012); Refractive surgery (Left, March/April 2018); Cataract extraction (Bilateral); and Eye surgery (Cataracts 2023).   Medications: She has a current medication list which includes the following prescription(s): acetaminophen, aspirin ec, estradiol, hydrochlorothiazide, multiple vitamins-minerals, potassium chloride sa, and vyzulta.   Allergies: Patient is allergic to medrol [methylprednisolone], alphagan [brimonidine], carvedilol, rhopressa [netarsudil dimesylate], zioptan [tafluprost (pf)], amlodipine, lisinopril, losartan, penicillins, sulfonamide derivatives, and timolol.   Social History: Patient  reports that she quit smoking about 1 years ago. Her smoking use included cigarettes. She started smoking about 41 years ago. She has a 20 pack-year smoking history. She has never used  smokeless tobacco. She reports that she does not drink alcohol and does not use drugs.      Objective:    BP (!) 143/86   Pulse 82  Gen: No apparent distress, A&O x 3. Pelvic Exam: Irritated external female genitalia; Bartholin's and Skene's glands normal in appearance; urethral meatus with caruncle, no urethral masses or discharge.   A size #3 donut pessary was fitted. It was comfortable, stayed in place with valsalva and was an appropriate size on examination, with one finger fitting between the pessary and the vaginal walls. We tied a string to it and the patient demonstrated proper removal and replacement.    Assessment/Plan:    Assessment: Suzanne Turner is a 78 y.o. with stage III pelvic organ prolapse who presents for a pessary fitting. Plan: She was fitted with a #3 donut pessary. She will remove nightly . She will use lubricant.   Follow-up in 4 weeks for a pessary check or sooner as needed.  All questions were answered.    Selmer Dominion, NP

## 2023-03-28 LAB — CERVICOVAGINAL ANCILLARY ONLY
Bacterial Vaginitis (gardnerella): NEGATIVE
Candida Glabrata: NEGATIVE
Candida Vaginitis: NEGATIVE
Comment: NEGATIVE
Comment: NEGATIVE
Comment: NEGATIVE

## 2023-03-29 ENCOUNTER — Encounter: Payer: Self-pay | Admitting: Obstetrics and Gynecology

## 2023-04-15 NOTE — Assessment & Plan Note (Signed)
 Encourage heart healthy diet such as MIND or DASH diet, increase exercise, avoid trans fats, simple carbohydrates and processed foods, consider a krill or fish or flaxseed oil cap daily.

## 2023-04-15 NOTE — Assessment & Plan Note (Signed)
 Well controlled, no changes to meds. Encouraged heart healthy diet such as the DASH diet and exercise as tolerated.

## 2023-04-15 NOTE — Assessment & Plan Note (Signed)
Take Miralax with Benefiber daily

## 2023-04-15 NOTE — Assessment & Plan Note (Signed)
 Supplement and monitor

## 2023-04-15 NOTE — Assessment & Plan Note (Signed)
 No recent exacerbation

## 2023-04-16 DIAGNOSIS — H34831 Tributary (branch) retinal vein occlusion, right eye, with macular edema: Secondary | ICD-10-CM | POA: Diagnosis not present

## 2023-04-17 ENCOUNTER — Other Ambulatory Visit: Payer: Self-pay | Admitting: Family Medicine

## 2023-04-19 ENCOUNTER — Encounter: Payer: Self-pay | Admitting: Family Medicine

## 2023-04-19 ENCOUNTER — Ambulatory Visit: Payer: Medicare HMO | Admitting: Family Medicine

## 2023-04-19 ENCOUNTER — Encounter: Payer: Self-pay | Admitting: Gastroenterology

## 2023-04-19 VITALS — BP 136/82 | HR 85 | Temp 97.7°F | Resp 18 | Ht 67.0 in | Wt 124.0 lb

## 2023-04-19 DIAGNOSIS — K5900 Constipation, unspecified: Secondary | ICD-10-CM

## 2023-04-19 DIAGNOSIS — Z634 Disappearance and death of family member: Secondary | ICD-10-CM

## 2023-04-19 DIAGNOSIS — E782 Mixed hyperlipidemia: Secondary | ICD-10-CM

## 2023-04-19 DIAGNOSIS — J449 Chronic obstructive pulmonary disease, unspecified: Secondary | ICD-10-CM

## 2023-04-19 DIAGNOSIS — E559 Vitamin D deficiency, unspecified: Secondary | ICD-10-CM | POA: Diagnosis not present

## 2023-04-19 DIAGNOSIS — I10 Essential (primary) hypertension: Secondary | ICD-10-CM

## 2023-04-19 DIAGNOSIS — N8110 Cystocele, unspecified: Secondary | ICD-10-CM

## 2023-04-19 DIAGNOSIS — F4321 Adjustment disorder with depressed mood: Secondary | ICD-10-CM | POA: Diagnosis not present

## 2023-04-19 DIAGNOSIS — R194 Change in bowel habit: Secondary | ICD-10-CM | POA: Diagnosis not present

## 2023-04-19 LAB — LIPID PANEL
Cholesterol: 192 mg/dL (ref 0–200)
HDL: 61.3 mg/dL (ref >39.00)
LDL Cholesterol: 111 mg/dL — ABNORMAL HIGH (ref 0–99)
NonHDL: 130.27
Total CHOL/HDL Ratio: 3
Triglycerides: 94 mg/dL (ref 0.0–149.0)
VLDL: 18.8 mg/dL (ref 0.0–40.0)

## 2023-04-19 LAB — COMPREHENSIVE METABOLIC PANEL WITH GFR
ALT: 10 U/L (ref 0–35)
AST: 19 U/L (ref 0–37)
Albumin: 4.5 g/dL (ref 3.5–5.2)
Alkaline Phosphatase: 79 U/L (ref 39–117)
BUN: 11 mg/dL (ref 6–23)
CO2: 27 meq/L (ref 19–32)
Calcium: 9.8 mg/dL (ref 8.4–10.5)
Chloride: 99 meq/L (ref 96–112)
Creatinine, Ser: 0.94 mg/dL (ref 0.40–1.20)
GFR: 58.34 mL/min — ABNORMAL LOW (ref >60.00)
Glucose, Bld: 76 mg/dL (ref 70–99)
Potassium: 3.6 meq/L (ref 3.5–5.1)
Sodium: 135 meq/L (ref 135–145)
Total Bilirubin: 0.8 mg/dL (ref 0.2–1.2)
Total Protein: 7.8 g/dL (ref 6.0–8.3)

## 2023-04-19 LAB — CBC WITH DIFFERENTIAL/PLATELET
Basophils Absolute: 0 10*3/uL (ref 0.0–0.1)
Basophils Relative: 0.8 % (ref 0.0–3.0)
Eosinophils Absolute: 0.1 10*3/uL (ref 0.0–0.7)
Eosinophils Relative: 3 % (ref 0.0–5.0)
HCT: 37.6 % (ref 36.0–46.0)
Hemoglobin: 12.9 g/dL (ref 12.0–15.0)
Lymphocytes Relative: 7.4 % — ABNORMAL LOW (ref 12.0–46.0)
Lymphs Abs: 0.3 10*3/uL — ABNORMAL LOW (ref 0.7–4.0)
MCHC: 34.3 g/dL (ref 30.0–36.0)
MCV: 92.7 fl (ref 78.0–100.0)
Monocytes Absolute: 0.4 10*3/uL (ref 0.1–1.0)
Monocytes Relative: 10.6 % (ref 3.0–12.0)
Neutro Abs: 3.1 10*3/uL (ref 1.4–7.7)
Neutrophils Relative %: 78.2 % — ABNORMAL HIGH (ref 43.0–77.0)
Platelets: 270 10*3/uL (ref 150.0–400.0)
RBC: 4.05 Mil/uL (ref 3.87–5.11)
RDW: 13 % (ref 11.5–15.5)
WBC: 3.9 10*3/uL — ABNORMAL LOW (ref 4.0–10.5)

## 2023-04-19 NOTE — Assessment & Plan Note (Signed)
 Her son died unexpectedly at age 78 in December and she is struggling with the loss. She feels she is managing without medications and is not interested in them at this time. She is not considering counseling at this time will let us know if that changes.

## 2023-04-19 NOTE — Assessment & Plan Note (Signed)
 Has been seen by urogynecology and is using a pessary now and doing much better.

## 2023-04-19 NOTE — Patient Instructions (Signed)
 Hypertension, Adult High blood pressure (hypertension) is when the force of blood pumping through the arteries is too strong. The arteries are the blood vessels that carry blood from the heart throughout the body. Hypertension forces the heart to work harder to pump blood and may cause arteries to become narrow or stiff. Untreated or uncontrolled hypertension can lead to a heart attack, heart failure, a stroke, kidney disease, and other problems. A blood pressure reading consists of a higher number over a lower number. Ideally, your blood pressure should be below 120/80. The first ("top") number is called the systolic pressure. It is a measure of the pressure in your arteries as your heart beats. The second ("bottom") number is called the diastolic pressure. It is a measure of the pressure in your arteries as the heart relaxes. What are the causes? The exact cause of this condition is not known. There are some conditions that result in high blood pressure. What increases the risk? Certain factors may make you more likely to develop high blood pressure. Some of these risk factors are under your control, including: Smoking. Not getting enough exercise or physical activity. Being overweight. Having too much fat, sugar, calories, or salt (sodium) in your diet. Drinking too much alcohol. Other risk factors include: Having a personal history of heart disease, diabetes, high cholesterol, or kidney disease. Stress. Having a family history of high blood pressure and high cholesterol. Having obstructive sleep apnea. Age. The risk increases with age. What are the signs or symptoms? High blood pressure may not cause symptoms. Very high blood pressure (hypertensive crisis) may cause: Headache. Fast or irregular heartbeats (palpitations). Shortness of breath. Nosebleed. Nausea and vomiting. Vision changes. Severe chest pain, dizziness, and seizures. How is this diagnosed? This condition is diagnosed by  measuring your blood pressure while you are seated, with your arm resting on a flat surface, your legs uncrossed, and your feet flat on the floor. The cuff of the blood pressure monitor will be placed directly against the skin of your upper arm at the level of your heart. Blood pressure should be measured at least twice using the same arm. Certain conditions can cause a difference in blood pressure between your right and left arms. If you have a high blood pressure reading during one visit or you have normal blood pressure with other risk factors, you may be asked to: Return on a different day to have your blood pressure checked again. Monitor your blood pressure at home for 1 week or longer. If you are diagnosed with hypertension, you may have other blood or imaging tests to help your health care provider understand your overall risk for other conditions. How is this treated? This condition is treated by making healthy lifestyle changes, such as eating healthy foods, exercising more, and reducing your alcohol intake. You may be referred for counseling on a healthy diet and physical activity. Your health care provider may prescribe medicine if lifestyle changes are not enough to get your blood pressure under control and if: Your systolic blood pressure is above 130. Your diastolic blood pressure is above 80. Your personal target blood pressure may vary depending on your medical conditions, your age, and other factors. Follow these instructions at home: Eating and drinking  Eat a diet that is high in fiber and potassium, and low in sodium, added sugar, and fat. An example of this eating plan is called the DASH diet. DASH stands for Dietary Approaches to Stop Hypertension. To eat this way: Eat  plenty of fresh fruits and vegetables. Try to fill one half of your plate at each meal with fruits and vegetables. Eat whole grains, such as whole-wheat pasta, brown rice, or whole-grain bread. Fill about one  fourth of your plate with whole grains. Eat or drink low-fat dairy products, such as skim milk or low-fat yogurt. Avoid fatty cuts of meat, processed or cured meats, and poultry with skin. Fill about one fourth of your plate with lean proteins, such as fish, chicken without skin, beans, eggs, or tofu. Avoid pre-made and processed foods. These tend to be higher in sodium, added sugar, and fat. Reduce your daily sodium intake. Many people with hypertension should eat less than 1,500 mg of sodium a day. Do not drink alcohol if: Your health care provider tells you not to drink. You are pregnant, may be pregnant, or are planning to become pregnant. If you drink alcohol: Limit how much you have to: 0-1 drink a day for women. 0-2 drinks a day for men. Know how much alcohol is in your drink. In the [place] one drink equals one 12 oz bottle of beer (355 mL), one 5 oz glass of wine (148 mL), or one 1 oz glass of hard liquor (44 mL). Lifestyle  Work with your health care provider to maintain a healthy body weight or to lose weight. Ask what an ideal weight is for you. Get at least 30 minutes of exercise that causes your heart to beat faster (aerobic exercise) most days of the week. Activities may include walking, swimming, or biking. Include exercise to strengthen your muscles (resistance exercise), such as Pilates or lifting weights, as part of your weekly exercise routine. Try to do these types of exercises for 30 minutes at least 3 days a week. Do not use any products that contain nicotine or tobacco. These products include cigarettes, chewing tobacco, and vaping devices, such as e-cigarettes. If you need help quitting, ask your health care provider. Monitor your blood pressure at home as told by your health care provider. Keep all follow-up visits. This is important. Medicines Take over-the-counter and prescription medicines only as told by your health care provider. Follow directions carefully. Blood  pressure medicines must be taken as prescribed. Do not skip doses of blood pressure medicine. Doing this puts you at risk for problems and can make the medicine less effective. Ask your health care provider about side effects or reactions to medicines that you should watch for. Contact a health care provider if you: Think you are having a reaction to a medicine you are taking. Have headaches that keep coming back (recurring). Feel dizzy. Have swelling in your ankles. Have trouble with your vision. Get help right away if you: Develop a severe headache or confusion. Have unusual weakness or numbness. Feel faint. Have severe pain in your chest or abdomen. Vomit repeatedly. Have trouble breathing. These symptoms may be an emergency. Get help right away. Call 911. Do not wait to see if the symptoms will go away. Do not drive yourself to the hospital. Summary Hypertension is when the force of blood pumping through your arteries is too strong. If this condition is not controlled, it may put you at risk for serious complications. Your personal target blood pressure may vary depending on your medical conditions, your age, and other factors. For most people, a normal blood pressure is less than 120/80. Hypertension is treated with lifestyle changes, medicines, or a combination of both. Lifestyle changes include losing weight, eating a healthy,  low-sodium diet, exercising more, and limiting alcohol. This information is not intended to replace advice given to you by your health care provider. Make sure you discuss any questions you have with your health care provider. Document Revised: 11/16/2020 Document Reviewed: 11/16/2020 Elsevier Patient Education  2024 [employer].

## 2023-04-20 LAB — VITAMIN D 25 HYDROXY (VIT D DEFICIENCY, FRACTURES): VITD: 62.55 ng/mL (ref 30.00–100.00)

## 2023-04-20 LAB — TSH: TSH: 3.14 u[IU]/mL (ref 0.35–5.50)

## 2023-04-21 ENCOUNTER — Encounter: Payer: Self-pay | Admitting: Family Medicine

## 2023-04-21 NOTE — Progress Notes (Signed)
 Subjective:    Patient ID: Suzanne Turner, female    DOB: March 13, 1945, 78 y.o.   MRN: [id number]  Chief Complaint  Patient presents with  . Follow-up    HPI Discussed the use of AI scribe software for clinical note transcription with the patient, who gave verbal consent to proceed.  History of Present Illness Suzanne Turner is a 78 year old female with stage 3 bladder prolapse who presents for follow-up after pessary fitting. She was referred by Dr. Armando Gang for urogynecological evaluation.  She presents for follow-up after being fitted with a pessary for stage 3 bladder prolapse. The prolapse worsened significantly over the two months prior to her initial appointment with a urogynecologist. She was initially hesitant about surgery and opted for a pessary, which has provided immense relief, allowing her to urinate every three hours instead of constantly. However, she finds the pessary difficult to remove due to its thickness. The prolapse has significantly impacted her life, but the pessary has allowed her to engage in activities without fear of incontinence.  She experiences gastrointestinal issues, including alternating constipation and diarrhea, despite weight loss and dietary changes. No blood or black stools are observed. She has a history of seeing Dr. Barron Alvine for gastrointestinal concerns and is considering a follow-up due to these ongoing symptoms.  She has experienced significant emotional distress following the unexpected death of her 2 year old son in December, which has contributed to a difficult winter. She describes feeling physically ill for at least six weeks following her son's death and has not been able to grieve properly due to the lack of a funeral or other rituals.  She has a history of smoking but has quit and is currently using Nicotine product. She has not undergone lung cancer screening recently but has not smoked during this period.    Past Medical History:   Diagnosis Date  . Abnormal thyroid blood test 01/28/2016  . Allergy    See attached list  . Bronchitis   . Cataract   . Chronic eczema   . COPD (chronic obstructive pulmonary disease) (HCC)   . Glaucoma 07/28/2014   Left eye Dr Mckinley Jewel at [facility]  . H/O measles   . H/O mumps   . High cholesterol    no medications  . History of chicken pox   . Hypercalcemia 08/02/2014  . Hypertension   . Kidney stone   . Nummular dermatitis   . 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   Left eye Dr Mckinley Jewel at [facility]   . Osteoarthritis   . Osteopenia   . Preventative health care 01/30/2016  . Salivary duct stone   . Skin cancer   . Urinary incontinence   . Vitamin D deficiency   . Wears glasses     Past Surgical History:  Procedure Laterality Date  . CATARACT EXTRACTION Bilateral    May 31, 2021 and March 2023  . EYE SURGERY  Cataracts 2023  . ORIF TIBIA & FIBULA FRACTURES Right 1966  . REFRACTIVE SURGERY Left March/April 2018  . SALIVARY STONE REMOVAL Right 05/15/2012   Procedure: EXCISION OF RIGHT SUBMANDIBULAR GLAND ;  Surgeon: Serena Colonel, MD;  Location: [facility] OR;  Service: ENT;  Laterality: Right;  . SALIVARY STONE REMOVAL N/A 11/18/2012   Procedure: INTRAORAL EXCISION SUBMANDIBULAR STONE RIGHT ;  Surgeon: Serena Colonel, MD;  Location: [facility];  Service: ENT;  Laterality: N/A;  . SKIN BIOPSY Right 12/2012   legs  . TONSILLECTOMY  1949  . TUBAL LIGATION  1977    Family History  Problem Relation Age of Onset  . Arrhythmia Mother   . Colon cancer Mother 61  . Cancer Mother   . Tremor Mother   . Heart disease Mother   . Heart disease Father 73  . Arthritis Father        broken back with chronic pain  . Glaucoma Father   . Breast cancer Sister   . Cancer Sister 85       breast  . Glaucoma Sister   . Heart disease Brother   . Arrhythmia Brother   . Tremor Brother   . Hypertension Daughter   . Colon polyps Maternal  Aunt   . Arrhythmia Maternal Uncle   . Heart disease Maternal Uncle   . Arrhythmia Maternal Grandmother   . Glaucoma Maternal Grandmother   . Bladder Cancer Neg Hx   . Uterine cancer Neg Hx     Social History   Socioeconomic History  . Marital status: Widowed    Spouse name: Not on file  . Number of children: Not on file  . Years of education: Not on file  . Highest education level: Bachelor's degree (e.g., BA, AB, BS)  Occupational History  . Not on file  Tobacco Use  . Smoking status: Former    Current packs/day: 0.00    Average packs/day: 0.5 packs/day for 40.0 years (20.0 ttl pk-yrs)    Types: Cigarettes    Start date: 04/15/1981    Quit date: 04/15/2021    Years since quitting: 2.0  . Smokeless tobacco: Never  . Tobacco comments:    Using nicotine lozenges as needed  Vaping Use  . Vaping status: Never Used  Substance and Sexual Activity  . Alcohol use: No  . Drug use: No  . Sexual activity: Not Currently    Comment: lives alone, widowed, retired from Print production planner, education. and real estate, no dietary restrictions  Other Topics Concern  . Not on file  Social History Narrative  . Not on file   Social Drivers of Health   Financial Resource Strain: Medium Risk (04/12/2023)   Overall Financial Resource Strain (CARDIA)   . Difficulty of Paying Living Expenses: Somewhat hard  Food Insecurity: Food Insecurity Present (04/12/2023)   Hunger Vital Sign   . Worried About Programme researcher, broadcasting/film/video in the Last Year: Sometimes true   . Ran Out of Food in the Last Year: Sometimes true  Transportation Needs: No Transportation Needs (04/12/2023)   PRAPARE - Transportation   . Lack of Transportation (Medical): No   . Lack of Transportation (Non-Medical): No  Physical Activity: Insufficiently Active (04/12/2023)   Exercise Vital Sign   . Days of Exercise per Week: 2 days   . Minutes of Exercise per Session: 30 min  Stress: Stress Concern Present (04/12/2023)   [employer] of Occupational Health - Occupational Stress Questionnaire   . Feeling of Stress : To some extent  Social Connections: Socially Isolated (04/12/2023)   Social Connection and Isolation Panel [NHANES]   . Frequency of Communication with Friends and Family: More than three times a week   . Frequency of Social Gatherings with Friends and Family: Once a week   . Attends Religious Services: Never   . Active Member of Clubs or Organizations: No   . Attends Banker Meetings: Never   . Marital Status: Widowed  Intimate Partner Violence: Not At Risk (07/15/2020)   Humiliation, Afraid,  Rape, and Kick questionnaire   . Fear of Current or Ex-Partner: No   . Emotionally Abused: No   . Physically Abused: No   . Sexually Abused: No    Outpatient Medications Prior to Visit  Medication Sig Dispense Refill  . acetaminophen (TYLENOL) 325 MG tablet Take 325 mg by mouth daily as needed for pain.    Marland Kitchen aspirin EC 81 MG tablet Take 81 mg by mouth daily.    . hydrochlorothiazide (HYDRODIURIL) 25 MG tablet TAKE 1/2 TABLET BY MOUTH 3 TIMES EVERY WEEK 19 tablet 1  . Multiple Vitamins-Minerals (HAIR/SKIN/NAILS/BIOTIN PO) Take 500 mg by mouth daily.    . potassium chloride SA (KLOR-CON M) 20 MEQ tablet Take 1 tablet (20 mEq total) by mouth daily. 90 tablet 1  . VYZULTA 0.024 % SOLN Place 1 drop into both eyes at bedtime.    Marland Kitchen estradiol (ESTRACE) 0.1 MG/GM vaginal cream Place 0.5 g vaginally 2 (two) times a week. Place 0.5g nightly for two weeks then twice a week after (Patient not taking: Reported on 04/19/2023) 30 g 3   No facility-administered medications prior to visit.    Allergies  Allergen Reactions  . Medrol [Methylprednisolone] Swelling and Other (See Comments)    Facial and lip swelling, but no breathing issues  . Alphagan [Brimonidine] Other (See Comments)    Eye irritation / eye fatigue  . Carvedilol Diarrhea  . Rhopressa [Netarsudil Dimesylate] Itching  . Zioptan [Tafluprost  (Pf)] Other (See Comments)    Eye irritation  . Amlodipine Other (See Comments)    Hip,legs,and feet feels achy and stiff  . Lisinopril Rash  . Losartan Anxiety  . Penicillins Rash  . Sulfonamide Derivatives Rash  . Timolol Rash    Review of Systems  Constitutional:  Positive for malaise/fatigue. Negative for fever.  HENT:  Negative for congestion.   Eyes:  Negative for blurred vision.  Respiratory:  Negative for shortness of breath.   Cardiovascular:  Negative for chest pain, palpitations and leg swelling.  Gastrointestinal:  Positive for constipation and diarrhea. Negative for abdominal pain, blood in stool and nausea.  Genitourinary:  Positive for frequency and urgency. Negative for dysuria.  Musculoskeletal:  Negative for falls.  Skin:  Negative for rash.  Neurological:  Negative for dizziness, loss of consciousness and headaches.  Endo/Heme/Allergies:  Negative for environmental allergies.  Psychiatric/Behavioral:  Positive for depression. Negative for substance abuse and suicidal ideas. The patient is nervous/anxious.       Objective:    Physical Exam  BP 136/82 (BP Location: Left Arm, Patient Position: Sitting, Cuff Size: Normal)   Pulse 85   Temp 97.7 F (36.5 C) (Oral)   Resp 18   Ht 5\' 7"  (1.702 m)   Wt 124 lb (56.2 kg)   SpO2 100%   BMI 19.42 kg/m  Wt Readings from Last 3 Encounters:  04/19/23 124 lb (56.2 kg)  03/05/23 127 lb 9.6 oz (57.9 kg)  08/15/22 132 lb 6.4 oz (60.1 kg)    Diabetic Foot Exam - Simple   No data filed    Lab Results  Component Value Date   WBC 3.9 (L) 04/19/2023   HGB 12.9 04/19/2023   HCT 37.6 04/19/2023   PLT 270.0 04/19/2023   GLUCOSE 76 04/19/2023   CHOL 192 04/19/2023   TRIG 94.0 04/19/2023   HDL 61.30 04/19/2023   LDLCALC 111 (H) 04/19/2023   ALT 10 04/19/2023   AST 19 04/19/2023   NA 135 04/19/2023   K  3.6 04/19/2023   CL 99 04/19/2023   CREATININE 0.94 04/19/2023   BUN 11 04/19/2023   CO2 27 04/19/2023   TSH  3.14 04/19/2023   INR 1.0 03/22/2020   HGBA1C 5.8 08/15/2022    Lab Results  Component Value Date   TSH 3.14 04/19/2023   Lab Results  Component Value Date   WBC 3.9 (L) 04/19/2023   HGB 12.9 04/19/2023   HCT 37.6 04/19/2023   MCV 92.7 04/19/2023   PLT 270.0 04/19/2023   Lab Results  Component Value Date   NA 135 04/19/2023   K 3.6 04/19/2023   CO2 27 04/19/2023   GLUCOSE 76 04/19/2023   BUN 11 04/19/2023   CREATININE 0.94 04/19/2023   BILITOT 0.8 04/19/2023   ALKPHOS 79 04/19/2023   AST 19 04/19/2023   ALT 10 04/19/2023   PROT 7.8 04/19/2023   ALBUMIN 4.5 04/19/2023   CALCIUM 9.8 04/19/2023   ANIONGAP 11 04/17/2021   GFR 58.34 (L) 04/19/2023   Lab Results  Component Value Date   CHOL 192 04/19/2023   Lab Results  Component Value Date   HDL 61.30 04/19/2023   Lab Results  Component Value Date   LDLCALC 111 (H) 04/19/2023   Lab Results  Component Value Date   TRIG 94.0 04/19/2023   Lab Results  Component Value Date   CHOLHDL 3 04/19/2023   Lab Results  Component Value Date   HGBA1C 5.8 08/15/2022       Assessment & Plan:  Chronic obstructive pulmonary disease, unspecified COPD type (HCC) Assessment & Plan: No recent exacerbation   Constipation, unspecified constipation type Assessment & Plan: Take Miralax with Benefiber daily  Orders: -     Ambulatory referral to Gastroenterology  Essential hypertension Assessment & Plan: Well controlled, no changes to meds. Encouraged heart healthy diet such as the DASH diet and exercise as tolerated.   Orders: -     CBC with Differential/Platelet -     Comprehensive metabolic panel with GFR -     TSH  Mixed hyperlipidemia Assessment & Plan: Encourage heart healthy diet such as MIND or DASH diet, increase exercise, avoid trans fats, simple carbohydrates and processed foods, consider a krill or fish or flaxseed oil cap daily.   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   Grief at loss of child Assessment & Plan: Her son died unexpectedly at age 15 in December and she is struggling with the loss. She feels she is managing without medications and is not interested in them at this time. She is not considering counseling at this time will let us know if that changes.    Change in bowel habits -     Ambulatory referral to Gastroenterology    Assessment and Plan Assessment & Plan Stage 3 Pelvic Organ Prolapse Pessary provides significant relief from urinary symptoms. She prefers to avoid surgery and is adjusting to pessary use. - Continue pessary use and follow up with urogynecology next week.  Grief and Emotional Distress Experiencing significant grief and emotional distress after son's death. Declined medication but open to it if symptoms worsen. - Offer counseling for emotional support and problem-solving. - Monitor emotional well-being and consider  medication if symptoms worsen.  Change in Bowel Habits Alternating constipation and diarrhea possibly related to stress, diet, or pessary use. No blood or melena reported. Prefers to address bladder issues first. - Refer to gastroenterologist Dr. Barron Alvine for evaluation of bowel habit changes.  General Health Maintenance Former smoker at risk for lung cancer. Declined screening but continues Nicorette use. - Offer lung cancer screening if she decides to proceed in the future.     Danise Edge, MD

## 2023-04-27 ENCOUNTER — Other Ambulatory Visit (HOSPITAL_COMMUNITY)
Admission: RE | Admit: 2023-04-27 | Discharge: 2023-04-27 | Disposition: A | Discharge status: Home / Self Care | Source: Other Acute Inpatient Hospital | Attending: Obstetrics and Gynecology | Admitting: Obstetrics and Gynecology

## 2023-04-27 ENCOUNTER — Other Ambulatory Visit (HOSPITAL_COMMUNITY)
Admission: RE | Admit: 2023-04-27 | Discharge: 2023-04-27 | Disposition: A | Discharge status: Home / Self Care | Source: Ambulatory Visit | Attending: Obstetrics and Gynecology | Admitting: Obstetrics and Gynecology

## 2023-04-27 ENCOUNTER — Ambulatory Visit: Admitting: Obstetrics and Gynecology

## 2023-04-27 VITALS — BP 143/82 | HR 85

## 2023-04-27 DIAGNOSIS — N813 Complete uterovaginal prolapse: Secondary | ICD-10-CM | POA: Diagnosis not present

## 2023-04-27 DIAGNOSIS — N898 Other specified noninflammatory disorders of vagina: Secondary | ICD-10-CM

## 2023-04-27 DIAGNOSIS — R350 Frequency of micturition: Secondary | ICD-10-CM

## 2023-04-27 LAB — POCT URINALYSIS DIPSTICK
Bilirubin, UA: NEGATIVE
Blood, UA: NEGATIVE
Glucose, UA: NEGATIVE
Ketones, UA: NEGATIVE
Leukocytes, UA: NEGATIVE
Nitrite, UA: POSITIVE
Protein, UA: NEGATIVE
Spec Grav, UA: 1.01 (ref 1.010–1.025)
Urobilinogen, UA: 0.2 U/dL
pH, UA: 6.5 (ref 5.0–8.0)

## 2023-04-27 NOTE — Progress Notes (Signed)
 Marland Kitchen

## 2023-04-27 NOTE — Patient Instructions (Signed)
 Please use estrogen cream nightly for the next 3 nights and then go down to twice a week  We use Hibaclens in the office to clean the pessary  You can call if any concerns or bleeding  Take the pessary out every 2-3 days   We will call you with the results of your swab

## 2023-04-29 ENCOUNTER — Encounter: Payer: Self-pay | Admitting: Obstetrics and Gynecology

## 2023-04-29 ENCOUNTER — Other Ambulatory Visit: Payer: Self-pay | Admitting: Obstetrics and Gynecology

## 2023-04-29 DIAGNOSIS — N390 Urinary tract infection, site not specified: Secondary | ICD-10-CM

## 2023-04-29 LAB — URINE CULTURE: Culture: 70000 — AB

## 2023-04-29 MED ORDER — NITROFURANTOIN MONOHYD MACRO 100 MG PO CAPS
100.0000 mg | ORAL_CAPSULE | Freq: Two times a day (BID) | ORAL | 0 refills | Status: AC
Start: 2023-04-29 — End: 2023-05-04

## 2023-04-29 NOTE — Progress Notes (Signed)
 [facility]   Subjective:     Chief Complaint:  Chief Complaint  Patient presents with   Pessary Check    Suzanne Turner is a 78 y.o. female is here for pessary check.   History of Present Illness: Suzanne Turner is a 78 y.o. female with stage III pelvic organ prolapse who presents for a pessary check. She is using a size #3 donut pessary. Patient reports she has been having some vaginal discharge that is concerning to her. She also reports concerns for a possible UTI. She does not have the pessary in at this time and has been maintaining it herself. She is not currently using vaginal estrogen cream.  Past Medical History: Patient  has a past medical history of Abnormal thyroid blood test (01/28/2016), Allergy, Bronchitis, Cataract, Chronic eczema, COPD (chronic obstructive pulmonary disease) (HCC), Glaucoma (07/28/2014), H/O measles, H/O mumps, High cholesterol, History of chicken pox, Hypercalcemia (08/02/2014), Hypertension, Kidney stone, Nummular dermatitis, Open-angle glaucoma (07/28/2014), Osteoarthritis, Osteopenia, Preventative health care (01/30/2016), Salivary duct stone, Skin cancer, Urinary incontinence, Vitamin D deficiency, and Wears glasses.   Past Surgical History: She  has a past surgical history that includes Tonsillectomy (1949); Tubal ligation (1977); Salivary stone removal (Right, 05/15/2012); ORIF tibia & fibula fractures (Right, 1966); Salivary stone removal (N/A, 11/18/2012); Skin biopsy (Right, 12/2012); Refractive surgery (Left, March/April 2018); Cataract extraction (Bilateral); and Eye surgery (Cataracts 2023).   Medications: She has a current medication list which includes the following prescription(s): acetaminophen, aspirin ec, hydrochlorothiazide, multiple vitamins-minerals, potassium chloride sa, vyzulta, and nitrofurantoin (macrocrystal-monohydrate).   Allergies: Patient is allergic to medrol [methylprednisolone], alphagan [brimonidine],  carvedilol, rhopressa [netarsudil dimesylate], zioptan [tafluprost (pf)], amlodipine, lisinopril, losartan, penicillins, sulfonamide derivatives, and timolol.   Social History: Patient  reports that she quit smoking about 2 years ago. Her smoking use included cigarettes. She started smoking about 42 years ago. She has a 20 pack-year smoking history. She has never used smokeless tobacco. She reports that she does not drink alcohol and does not use drugs.      Objective:    Physical Exam: BP (!) 143/82   Pulse 85  Gen: No apparent distress, A&O x 3. Detailed Urogynecologic Evaluation:  Pelvic Exam: Normal external female genitalia; Bartholin's and Skene's glands normal in appearance; urethral meatus normal in appearance, no urethral masses or discharge. The pessary was not in during exam. It was removed and cleaned. Speculum exam revealed no lesions in the vagina. There was no obvious vaginal discharge but patient was concerned about an odor and a purulent white drainage. Aptima swab obtained.   Laboratory Results: Urine dipstick shows:  Lab Results  Component Value Date   COLORU yellow 04/27/2023   CLARITYU clear 04/27/2023   GLUCOSEUR Negative 04/27/2023   BILIRUBINUR negative 04/27/2023   KETONESU negatuve 04/27/2023   SPECGRAV 1.010 04/27/2023   RBCUR negative 04/27/2023   PHUR 6.5 04/27/2023   PROTEINUR Negative 04/27/2023   UROBILINOGEN 0.2 04/27/2023   LEUKOCYTESUR Negative 04/27/2023   Susceptibility data from last 90 days. Collected Specimen Info Organism Ciprofloxacin Gentamicin Susc lslt Inducible Clindamycin Nitrofurantoin Susc lslt Oxacillin Rifampin TELAVANCIN TETRACYCLINE Trimethoprim/Sulfa  04/27/23 Urine, Random Staphylococcus epidermidis  S  S  S  S  R  S  S  S  S       Assessment/Plan:    Assessment: Ms. Fike is a 78 y.o. with stage III pelvic organ prolapse here for a pessary check. She is doing well.  Plan: She will await swab results  and urine  culture before re-inserting pessary. We also discussed re-starting vaginal estrogen for the next few nights. She will plan to remove the pessary every 2-3 days.   Macrobid sent in for UTI symptoms with +Culture results.

## 2023-05-01 ENCOUNTER — Other Ambulatory Visit: Payer: Self-pay | Admitting: Obstetrics and Gynecology

## 2023-05-01 LAB — CERVICOVAGINAL ANCILLARY ONLY
Bacterial Vaginitis (gardnerella): POSITIVE — AB
Candida Glabrata: NEGATIVE
Candida Vaginitis: POSITIVE — AB
Comment: NEGATIVE
Comment: NEGATIVE
Comment: NEGATIVE

## 2023-05-01 MED ORDER — METRONIDAZOLE 500 MG PO TABS: 500.0000 mg | ORAL_TABLET | Freq: Two times a day (BID) | ORAL | 0 refills | Status: AC

## 2023-05-01 MED ORDER — FLUCONAZOLE 150 MG PO TABS: 150.0000 mg | ORAL_TABLET | Freq: Every day | ORAL | 0 refills | Status: DC

## 2023-05-01 NOTE — Progress Notes (Signed)
 Please inform patient that her swab was positive for BV and yeast. I have sent treatment in for both.

## 2023-05-14 ENCOUNTER — Encounter: Payer: Self-pay | Admitting: Obstetrics and Gynecology

## 2023-05-14 ENCOUNTER — Other Ambulatory Visit: Payer: Self-pay | Admitting: Family Medicine

## 2023-05-14 DIAGNOSIS — Z1231 Encounter for screening mammogram for malignant neoplasm of breast: Secondary | ICD-10-CM

## 2023-05-24 ENCOUNTER — Ambulatory Visit: Admitting: Obstetrics and Gynecology

## 2023-05-29 ENCOUNTER — Encounter: Payer: Self-pay | Admitting: Family Medicine

## 2023-05-30 ENCOUNTER — Other Ambulatory Visit: Payer: Self-pay | Admitting: Family Medicine

## 2023-05-30 MED ORDER — AZITHROMYCIN 250 MG PO TABS: ORAL_TABLET | ORAL | 0 refills | Status: AC

## 2023-06-05 ENCOUNTER — Ambulatory Visit
Admission: RE | Admit: 2023-06-05 | Discharge: 2023-06-05 | Disposition: A | Discharge status: Home / Self Care | Source: Ambulatory Visit | Attending: Family Medicine | Admitting: Family Medicine

## 2023-06-05 DIAGNOSIS — Z1231 Encounter for screening mammogram for malignant neoplasm of breast: Secondary | ICD-10-CM

## 2023-06-08 ENCOUNTER — Ambulatory Visit: Admitting: Obstetrics and Gynecology

## 2023-06-08 ENCOUNTER — Encounter: Payer: Self-pay | Admitting: Obstetrics and Gynecology

## 2023-06-08 VITALS — BP 151/92 | HR 83 | Wt 121.4 lb

## 2023-06-08 DIAGNOSIS — N8110 Cystocele, unspecified: Secondary | ICD-10-CM

## 2023-06-08 NOTE — Progress Notes (Signed)
 [facility] Return Visit  SUBJECTIVE  History of Present Illness: JEIMMY MEWHINNEY is a 78 y.o. female seen in follow-up for Stage III pelvic organ prolapse. Plan at last visit was pessary trial.   Patient wanted a pessary she could take in and out and we tried a donut pessary. She reported great support and felt that it worked well, but she had a BV and yeast infection as well as a staph UTI which she associated with the pessary use. She reports she is not open to another pessary trial.   She has been using Vaseline on the prolapse.   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   Past Surgical History: She  has a past surgical history that includes Tonsillectomy (1949); Tubal ligation (1977); Salivary stone removal (Right, 05/15/2012); ORIF tibia & fibula fractures (Right, 1966); Salivary stone removal (N/A, 11/18/2012); Skin biopsy (Right, 12/2012); Refractive surgery (Left, March/April 2018); Cataract extraction (Bilateral); and Eye surgery (Cataracts 2023).   Medications: She has a current medication list which includes the following prescription(s): acetaminophen , aspirin  ec, fluconazole , hydrochlorothiazide , multiple vitamins-minerals, potassium chloride  sa, and vyzulta.   Allergies: Patient is allergic to medrol  [methylprednisolone ], alphagan [brimonidine], carvedilol , rhopressa [netarsudil dimesylate], zioptan [tafluprost (pf)], amlodipine , lisinopril , losartan ,  penicillins, sulfonamide derivatives, and timolol.   Social History: Patient  reports that she quit smoking about 2 years ago. Her smoking use included cigarettes. She started smoking about 42 years ago. She has a 20 pack-year smoking history. She has never used smokeless tobacco. She reports that she does not drink alcohol and does not use drugs.     OBJECTIVE     Physical Exam: Vitals:   06/08/23 0754 06/08/23 0826  BP: (!) 171/107 (!) 151/92  Pulse: 83   Weight: 121 lb 6.4 oz (55.1 kg)    Gen: No apparent distress, A&O x 3.  Detailed Urogynecologic Evaluation:  Deferred.    ASSESSMENT AND PLAN    Ms. John is a 78 y.o. with:  1. Pelvic organ prolapse quantification stage 3 cystocele    Patient had BV and yeast infections that she associated with the use of pessary and is not open to trying another pessary. I discussed that a pessary that she would leave in may be better with less tissue agitation. She declines further pessary trials.  Patient would like to have a colpocleisis with Dr. Aron Lard. Will plan for patient to undergo Urodynamics prior to surgical scheduling to assess for SUI with her current mixed incontinence.   Patient to follow up for urodynamics and then surgical planning with Dr. Aron Lard.    Maylani Embree G Zerek Litsey, NP

## 2023-06-27 ENCOUNTER — Ambulatory Visit: Admitting: Gastroenterology

## 2023-06-27 ENCOUNTER — Encounter: Payer: Self-pay | Admitting: Gastroenterology

## 2023-06-27 DIAGNOSIS — Z800 Family history of malignant neoplasm of digestive organs: Secondary | ICD-10-CM | POA: Diagnosis not present

## 2023-06-27 DIAGNOSIS — R195 Other fecal abnormalities: Secondary | ICD-10-CM

## 2023-06-27 DIAGNOSIS — R197 Diarrhea, unspecified: Secondary | ICD-10-CM | POA: Diagnosis not present

## 2023-06-27 MED ORDER — NA SULFATE-K SULFATE-MG SULF 17.5-3.13-1.6 GM/177ML PO SOLN: 1.0000 | ORAL | 0 refills | Status: DC

## 2023-06-27 NOTE — Patient Instructions (Addendum)
 _______________________________________________________  If your blood pressure at your visit was 140/90 or greater, please contact your primary care physician to follow up on this.  _______________________________________________________  If you are age 78 or older, your body mass index should be between 23-30. Your Body mass index is 19.11 kg/m. If this is out of the aforementioned range listed, please consider follow up with your Primary Care Provider.  If you are age 69 or younger, your body mass index should be between 19-25. Your Body mass index is 19.11 kg/m. If this is out of the aformentioned range listed, please consider follow up with your Primary Care Provider.  ________________________________________________________  Please purchase the following medications over the counter and take as directed:  START: Probiotic daily  Please start taking fiber supplement.  This may cause some bloating at first but that usually goes away. Begin with a small spoonful and work your way up to a large, heaping spoonful daily over a week.  You have been scheduled for a colonoscopy. Please follow written instructions given to you at your visit today.   If you use inhalers (even only as needed), please bring them with you on the day of your procedure.  DO NOT TAKE 7 DAYS PRIOR TO TEST- Trulicity (dulaglutide) Ozempic, Wegovy (semaglutide) Mounjaro (tirzepatide) Bydureon Bcise (exanatide extended release)  DO NOT TAKE 1 DAY PRIOR TO YOUR TEST Rybelsus (semaglutide) Adlyxin (lixisenatide) Victoza (liraglutide) Byetta (exanatide) ___________________________________________________________________________  Due to recent changes in healthcare laws, you may see the results of your imaging and laboratory studies on MyChart before your provider has had a chance to review them.  We understand that in some cases there may be results that are confusing or concerning to you. Not all laboratory  results come back in the same time frame and the provider may be waiting for multiple results in order to interpret others.  Please give us  48 hours in order for your provider to thoroughly review all the results before contacting the office for clarification of your results.   The [facility] providers would like to encourage you to use MYCHART to communicate with providers for non-urgent requests or questions.  Due to long hold times on the telephone, sending your provider a message by [facility] may be a faster and more efficient way to get a response.  Please allow 48 business hours for a response.  Please remember that this is for non-urgent requests.  _______________________________________________________  It was a pleasure to see you today!  Vito Cirigliano, D.O.

## 2023-06-27 NOTE — Progress Notes (Signed)
 Chief Complaint: Diarrhea, change in bowel habits   Referring Provider:     Neda Balk, MD    HPI:     Suzanne Turner is a 78 y.o. female with medical history as outlined below, referred to the [facility]ology [facility] for evaluation of diarrhea.  Has been having intermittent loose, inconsistent nonbloody stools since COVID in 2020.  Has 2-3 stools/week that tend to be pasty and not formed. Prior to this, was 3 formed stools weekly.  No associated hematochezia, melena, abdominal pain.  No fever.  Will use Imodium at times. Will use fiber supplement on demand. She was seen in this office on 11/08/2018 for CRC screening (declined repeat colonoscopy at that time), and was without any GI symptoms at that time.    Tries to maintain healthy diet.   Following with UroGyn for bladder prolapse with consideration for bladder lift this summer.   Reviewed most recent labs from 03/2023: WBC 3.9 (stable from previous), otherwise normal CBC.  Normal CMP, TSH, vitamin D .  No recent abdominal imaging for review.  FHx n/f mother with CRC in her 69's.  She thinks she had a history of polyps on her initial colonoscopy completed in [place] .    Endoscopic Hx: - Colonoscopy (07/2013): Normal. Repeat 5 years - Colonoscopy (06/2008, Dr. Grandville Lax): Normal. Repeat 5 years - Colonoscopy (2005 in [place]): No report in EMR for review.  Patient reports no polyps. - Colonoscopy (2000 in [place]): Internal hemorrhoids, otherwise normal. Repeat in 5 years  Past Medical History:  Diagnosis Date   Abnormal thyroid  blood test 01/28/2016   Allergy    See attached list   Bronchitis    Cataract    Chronic eczema    COPD (chronic obstructive pulmonary disease) (HCC)    Female bladder prolapse    level 3   Glaucoma 07/28/2014   Left eye Dr Oris Birmingham at [facility]   H/O measles    H/O mumps    High cholesterol    no medications   History of chicken pox    Hypercalcemia 08/02/2014    Hypertension    Kidney stone    Nummular dermatitis    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   Left eye Dr Oris Birmingham at [facility]    Osteoarthritis    Osteopenia    Preventative health care 01/30/2016   Salivary duct stone    Skin cancer    Staph infection    after pessary insertion   Urinary incontinence    Vitamin D  deficiency    Wears glasses      Past Surgical History:  Procedure Laterality Date   CATARACT EXTRACTION Bilateral    May 31, 2021 and March 2023   EYE SURGERY  Cataracts 2023   ORIF TIBIA & FIBULA FRACTURES Right 1966   REFRACTIVE SURGERY Left March/April 2018   SALIVARY STONE REMOVAL Right 05/15/2012   Procedure: EXCISION OF RIGHT SUBMANDIBULAR GLAND ;  Surgeon: Janita Mellow, MD;  Location: [facility] OR;  Service: ENT;  Laterality: Right;   SALIVARY STONE REMOVAL N/A 11/18/2012   Procedure: INTRAORAL EXCISION SUBMANDIBULAR STONE RIGHT ;  Surgeon: Janita Mellow, MD;  Location: [facility];  Service: ENT;  Laterality: N/A;   SKIN BIOPSY Right 12/2012   legs   TONSILLECTOMY  1949   TUBAL LIGATION  1977   Family History  Problem Relation Age of Onset   Arrhythmia Mother  Colon cancer Mother 58   Tremor Mother    Heart disease Mother    Heart disease Father 8   Arthritis Father        broken back with chronic pain   Glaucoma Father    Breast cancer Sister 71   Glaucoma Sister    GER disease Sister    Heart disease Brother    Arrhythmia Brother    Tremor Brother    Arrhythmia Maternal Grandmother    Glaucoma Maternal Grandmother    Hypertension Daughter    Other Son        unclear, maybe heart issue   Colon polyps Maternal Aunt    Arrhythmia Maternal Uncle    Heart disease Maternal Uncle    Bladder Cancer Neg Hx    Uterine cancer Neg Hx    Esophageal cancer Neg Hx    Social History   Tobacco Use   Smoking status: Former    Current packs/day: 0.00    Average packs/day: 0.5 packs/day for 40.0 years (20.0 ttl pk-yrs)     Types: Cigarettes    Start date: 04/15/1981    Quit date: 04/15/2021    Years since quitting: 2.2   Smokeless tobacco: Never   Tobacco comments:    Using nicotine lozenges as needed  Vaping Use   Vaping status: Never Used  Substance Use Topics   Alcohol use: No   Drug use: No   Current Outpatient Medications  Medication Sig Dispense Refill   acetaminophen  (TYLENOL ) 325 MG tablet Take 325 mg by mouth daily as needed for pain.     aspirin  EC 81 MG tablet Take 81 mg by mouth daily.     hydrochlorothiazide  (HYDRODIURIL ) 25 MG tablet TAKE 1/2 TABLET BY MOUTH 3 TIMES EVERY WEEK 19 tablet 1   Multiple Vitamins-Minerals (HAIR/SKIN/NAILS/BIOTIN PO) Take 500 mg by mouth daily.     potassium chloride  SA (KLOR-CON  M) 20 MEQ tablet Take 1 tablet (20 mEq total) by mouth daily. 90 tablet 1   VYZULTA 0.024 % SOLN Place 1 drop into both eyes at bedtime.     No current facility-administered medications for this visit.   Allergies  Allergen Reactions   Medrol  [Methylprednisolone ] Swelling and Other (See Comments)    Facial and lip swelling, but no breathing issues   Alphagan [Brimonidine] Other (See Comments)    Eye irritation / eye fatigue   Carvedilol  Diarrhea   Rhopressa [Netarsudil Dimesylate] Itching   Zioptan [Tafluprost (Pf)] Other (See Comments)    Eye irritation   Amlodipine  Other (See Comments)    Hip,legs,and feet feels achy and stiff   Lisinopril  Rash   Losartan  Anxiety   Penicillins Rash   Sulfonamide Derivatives Rash   Timolol Rash     Review of Systems: All systems reviewed and negative except where noted in HPI.     Physical Exam:    Wt Readings from Last 3 Encounters:  06/27/23 122 lb (55.3 kg)  06/08/23 121 lb 6.4 oz (55.1 kg)  04/19/23 124 lb (56.2 kg)    BP 122/70   Pulse 77   Ht 5\' 7"  (1.702 m)   Wt 122 lb (55.3 kg)   BMI 19.11 kg/m  Constitutional:  Pleasant, in no acute distress. Skin: Skin is warm and dry. No rashes noted.   ASSESSMENT AND PLAN;    1) Change in bowel habits  2) Diarrhea - Trial low FODMAP diet.  Provided with handout and detailed instructions today - Trial OTC probiotic -  Start OTC fiber supplement - Colonoscopy to evaluate for mucosal/luminal pathology with random and directed biopsies.  To be done with pediatric colonoscope  3) Family history of colon cancer - Screening at time of colonoscopy as above  The indications, risks, and benefits of colonoscopy were explained to the patient in detail. Risks include but are not limited to bleeding, perforation, adverse reaction to medications, and cardiopulmonary compromise. Sequelae include but are not limited to the possibility of surgery, hospitalization, and mortality. The patient verbalized understanding and wished to proceed. All questions answered, referred to the scheduler and bowel prep ordered. Further recommendations pending results of the exam.     Annis Kinder, DO, FACG  06/27/2023, 2:35 PM   Neda Balk, MD

## 2023-07-14 ENCOUNTER — Other Ambulatory Visit: Payer: Self-pay | Admitting: Family Medicine

## 2023-07-22 NOTE — Assessment & Plan Note (Signed)
 Encouraged to get adequate exercise, calcium and vitamin d intake

## 2023-07-22 NOTE — Assessment & Plan Note (Signed)
 Encourage heart healthy diet such as MIND or DASH diet, increase exercise, avoid trans fats, simple carbohydrates and processed foods, consider a krill or fish or flaxseed oil cap daily.

## 2023-07-22 NOTE — Assessment & Plan Note (Signed)
 No recent exacerbation

## 2023-07-22 NOTE — Assessment & Plan Note (Signed)
 Patient encouraged to maintain heart healthy diet, regular exercise, adequate sleep. Consider daily probiotics. Take medications as prescribed. Labs ordered and reviewed. MGM in 05/2023. Colonoscopy due later this year. Dexa scan 2021, repeat in 2025 or 26. Aged out of paps Given and reviewed copy of ACP documents from [employer] and encouraged to complete and return  She suffered a fall out of bed and hit her left eye on the side of her night stand and got a black eye. Feels much better now.

## 2023-07-22 NOTE — Assessment & Plan Note (Signed)
 Well controlled, no changes to meds. Encouraged heart healthy diet such as the DASH diet and exercise as tolerated.

## 2023-07-23 ENCOUNTER — Ambulatory Visit (INDEPENDENT_AMBULATORY_CARE_PROVIDER_SITE_OTHER): Payer: Medicare HMO | Admitting: Family Medicine

## 2023-07-23 ENCOUNTER — Encounter: Payer: Self-pay | Admitting: Family Medicine

## 2023-07-23 VITALS — BP 130/86 | HR 86 | Resp 16 | Ht 67.0 in | Wt 119.2 lb

## 2023-07-23 DIAGNOSIS — N3941 Urge incontinence: Secondary | ICD-10-CM

## 2023-07-23 DIAGNOSIS — N8110 Cystocele, unspecified: Secondary | ICD-10-CM | POA: Diagnosis not present

## 2023-07-23 DIAGNOSIS — J449 Chronic obstructive pulmonary disease, unspecified: Secondary | ICD-10-CM | POA: Diagnosis not present

## 2023-07-23 DIAGNOSIS — Z0000 Encounter for general adult medical examination without abnormal findings: Secondary | ICD-10-CM

## 2023-07-23 DIAGNOSIS — E782 Mixed hyperlipidemia: Secondary | ICD-10-CM | POA: Diagnosis not present

## 2023-07-23 DIAGNOSIS — M810 Age-related osteoporosis without current pathological fracture: Secondary | ICD-10-CM

## 2023-07-23 DIAGNOSIS — I10 Essential (primary) hypertension: Secondary | ICD-10-CM

## 2023-07-23 NOTE — Progress Notes (Signed)
 Subjective:    Patient ID: Suzanne Turner, female    DOB: [birth year], 78 y.o.   MRN: [id number]  Chief Complaint  Patient presents with   Annual Exam    Patient presents today for a physical exam.   Quality Metric Gaps    AWV, lung cancer screening    HPI Discussed the use of AI scribe software for clinical note transcription with the patient, who gave verbal consent to proceed.  History of Present Illness Suzanne Turner is a 78 year old female who presents with multiple health concerns including urinary tract infections, a recent fall, and dental issues.  She has a history of using a vaginal pessary, which initially worked well but led to significant complications. After attempting to remove it, she experienced a strong odor and subsequently developed two vaginal infections and a staph infection UTI, which made her feel very ill. She was treated with multiple antibiotics, including Flagyl , and possibly Macrobid  or nitrofurantoin , though she had adverse reactions to some medications. She continues to experience urinary incontinence and drooping, with a history of a significant urinary tract infection. She has had tests to explore treatment options for her incontinence.  She describes a recent viral illness, referred to as 'triple negative,' which lasted two weeks and was severe, for which she was prescribed antibiotics. During this illness, she used five boxes of Kleenex due to significant nasal discharge. She has since recovered from this illness.  Approximately a month ago, she fell out of bed, resulting in a black eye and a cut on her toe. She attributes the fall to overestimating her abdominal strength while reaching for blinds. No symptoms of concussion such as headache, vision changes, nausea, or vomiting were experienced following the fall.  She is experiencing dental issues, suspecting an abscessed tooth on the right lower side, which she believes may be related to  drainage from her recent cold. She has an appointment with her dentist to address this.  She reports erratic bowel habits with occasional loose stools, possibly related to familial intestinal issues. Her mother had colon cancer, and her sister has microscopic colitis. She and her siblings have a history of intestinal issues, describing that they all have 'bad guts.' A colonoscopy is scheduled to rule out serious conditions.  She maintains a diet with a focus on protein and vegetables, though she has lost some weight recently. No nausea or vomiting and she has a good appetite, particularly in the evening. She engages in regular physical activity, including swimming and yoga.    Past Medical History:  Diagnosis Date   Abnormal thyroid  blood test 01/28/2016   Allergy    See attached list   Bronchitis    Cataract    Chronic eczema    COPD (chronic obstructive pulmonary disease) 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   Left eye Dr Camie Albany at [facility]    Osteoarthritis    Osteopenia    Preventative health care 01/30/2016   Salivary duct stone    Skin cancer    Staph infection    after  pessary insertion   Urinary incontinence    Vitamin D  deficiency    Wears glasses     Past Surgical History:  Procedure Laterality Date   CATARACT EXTRACTION Bilateral    May 31, 2021 and March 2023   EYE SURGERY  Cataracts 2023   ORIF TIBIA & FIBULA FRACTURES Right 1966   REFRACTIVE SURGERY Left March/April 2018   SALIVARY STONE REMOVAL Right 05/15/2012   Procedure: EXCISION OF RIGHT SUBMANDIBULAR GLAND ;  Surgeon: Ida Loader, MD;  Location: [facility] OR;  Service: ENT;  Laterality: Right;    SALIVARY STONE REMOVAL N/A 11/18/2012   Procedure: INTRAORAL EXCISION SUBMANDIBULAR STONE RIGHT ;  Surgeon: Ida Loader, MD;  Location: [facility];  Service: ENT;  Laterality: N/A;   SKIN BIOPSY Right 12/2012   legs   TONSILLECTOMY  1949   TUBAL LIGATION  1977    Family History  Problem Relation Age of Onset   Arrhythmia Mother    Colon cancer Mother 64   Tremor Mother    Heart disease Mother    Heart disease Father 16   Arthritis Father        broken back with chronic pain   Glaucoma Father    Breast cancer Sister 68   Glaucoma Sister    GER disease Sister    Colitis Sister    Depression Brother    Heart disease Brother    Arrhythmia Brother    Tremor Brother    Hypertension Daughter    Other Son        unclear, maybe heart issue   Colon polyps Maternal Aunt    Arrhythmia Maternal Uncle    Heart disease Maternal Uncle    Arrhythmia Maternal Grandmother    Glaucoma Maternal Grandmother    Bladder Cancer Neg Hx    Uterine cancer Neg Hx    Esophageal cancer Neg Hx     Social History   Socioeconomic History   Marital status: Widowed    Spouse name: Not on file   Number of children: 2   Years of education: Not on file   Highest education level: Bachelor's degree (e.g., BA, AB, BS)  Occupational History   Occupation: retired  Tobacco Use   Smoking status: Former    Current packs/day: 0.00    Average packs/day: 0.5 packs/day for 40.0 years (20.0 ttl pk-yrs)    Types: Cigarettes    Start date: 04/15/1981    Quit date: 04/15/2021    Years since quitting: 2.2   Smokeless tobacco: Never   Tobacco comments:    Using nicotine lozenges as needed  Vaping Use   Vaping status: Never Used  Substance and Sexual Activity   Alcohol use: No   Drug use: No   Sexual activity: Not Currently    Comment: lives alone, widowed, retired from Print production planner, education. and real estate, no dietary restrictions  Other Topics Concern   Not on file  Social  History Narrative   Not on file   Social Drivers of Health   Financial Resource Strain: Low Risk  (07/19/2023)   Overall Financial Resource Strain (CARDIA)    Difficulty of Paying Living Expenses: Not very hard  Food Insecurity: No Food Insecurity (07/19/2023)   Hunger Vital Sign    Worried About Running Out of Food in the Last Year: Never true    Ran Out of Food in the Last Year: Never true  Transportation Needs: No Transportation Needs (07/19/2023)   PRAPARE - Transportation  Lack of Transportation (Medical): No    Lack of Transportation (Non-Medical): No  Physical Activity: Sufficiently Active (07/19/2023)   Exercise Vital Sign    Days of Exercise per Week: 3 days    Minutes of Exercise per Session: 60 min  Stress: Stress Concern Present (07/19/2023)   [employer] of Occupational Health - Occupational Stress Questionnaire    Feeling of Stress: To some extent  Social Connections: Socially Isolated (07/19/2023)   Social Connection and Isolation Panel    Frequency of Communication with Friends and Family: More than three times a week    Frequency of Social Gatherings with Friends and Family: Twice a week    Attends Religious Services: Never    Database administrator or Organizations: No    Attends Engineer, structural: Not on file    Marital Status: Widowed  Intimate Partner Violence: Not At Risk (07/15/2020)   Humiliation, Afraid, Rape, and Kick questionnaire    Fear of Current or Ex-Partner: No    Emotionally Abused: No    Physically Abused: No    Sexually Abused: No    Outpatient Medications Prior to Visit  Medication Sig Dispense Refill   acetaminophen  (TYLENOL ) 325 MG tablet Take 325 mg by mouth daily as needed for pain.     aspirin  EC 81 MG tablet Take 81 mg by mouth daily.     hydrochlorothiazide  (HYDRODIURIL ) 25 MG tablet Take 0.5 tablets (12.5 mg total) by mouth 3 (three) times a week. 19 tablet 0   Multiple Vitamins-Minerals (HAIR/SKIN/NAILS/BIOTIN PO)  Take 500 mg by mouth daily.     Na Sulfate-K Sulfate-Mg Sulfate concentrate (SUPREP BOWEL PREP KIT) 17.5-3.13-1.6 GM/177ML SOLN Take 1 kit (354 mLs total) by mouth as directed. 324 mL 0   potassium chloride  SA (KLOR-CON  M) 20 MEQ tablet Take 1 tablet (20 mEq total) by mouth daily. 90 tablet 1   VYZULTA 0.024 % SOLN Place 1 drop into both eyes at bedtime.     No facility-administered medications prior to visit.    Allergies  Allergen Reactions   Medrol  [Methylprednisolone ] Swelling and Other (See Comments)    Facial and lip swelling, but no breathing issues   Alphagan [Brimonidine] Other (See Comments)    Eye irritation / eye fatigue   Carvedilol  Diarrhea   Rhopressa [Netarsudil Dimesylate] Itching   Zioptan [Tafluprost (Pf)] Other (See Comments)    Eye irritation   Amlodipine  Other (See Comments)    Hip,legs,and feet feels achy and stiff   Lisinopril  Rash   Losartan  Anxiety   Penicillins Rash   Sulfonamide Derivatives Rash   Timolol Rash    Review of Systems  Constitutional:  Positive for malaise/fatigue. Negative for chills and fever.  HENT:  Negative for congestion and hearing loss.   Eyes:  Negative for blurred vision and discharge.  Respiratory:  Negative for cough, sputum production and shortness of breath.   Cardiovascular:  Negative for chest pain, palpitations and leg swelling.  Gastrointestinal:  Negative for abdominal pain, blood in stool, constipation, diarrhea, heartburn, nausea and vomiting.  Genitourinary:  Positive for frequency and urgency. Negative for dysuria and hematuria.  Musculoskeletal:  Positive for falls. Negative for back pain and myalgias.  Skin:  Negative for rash.  Neurological:  Negative for dizziness, sensory change, loss of consciousness, weakness and headaches.  Endo/Heme/Allergies:  Negative for environmental allergies. Does not bruise/bleed easily.  Psychiatric/Behavioral:  Negative for depression and suicidal ideas. The patient is not  nervous/anxious and does not have  insomnia.        Objective:    Physical Exam Constitutional:      General: She is not in acute distress.    Appearance: Normal appearance. She is well-developed. She is not toxic-appearing or diaphoretic.  HENT:     Head: Normocephalic and atraumatic.     Right Ear: Tympanic membrane, ear canal and external ear normal.     Left Ear: Tympanic membrane, ear canal and external ear normal.     Nose: Nose normal.     Mouth/Throat:     Mouth: Mucous membranes are moist.     Pharynx: Oropharynx is clear. No oropharyngeal exudate.   Eyes:     General: No scleral icterus.       Right eye: No discharge.        Left eye: No discharge.     Conjunctiva/sclera: Conjunctivae normal.     Pupils: Pupils are equal, round, and reactive to light.   Neck:     Thyroid : No thyromegaly.   Cardiovascular:     Rate and Rhythm: Normal rate and regular rhythm.     Heart sounds: Normal heart sounds. No murmur heard. Pulmonary:     Effort: Pulmonary effort is normal. No respiratory distress.     Breath sounds: Normal breath sounds. No wheezing or rales.  Abdominal:     General: Bowel sounds are normal. There is no distension.     Palpations: Abdomen is soft. There is no mass.     Tenderness: There is no abdominal tenderness. There is no guarding.   Musculoskeletal:        General: No tenderness. Normal range of motion.     Cervical back: Normal range of motion and neck supple.  Lymphadenopathy:     Cervical: No cervical adenopathy.   Skin:    General: Skin is warm and dry.     Findings: No rash.   Neurological:     General: No focal deficit present.     Mental Status: She is alert and oriented to person, place, and time.     Cranial Nerves: No cranial nerve deficit.     Coordination: Coordination normal.     Deep Tendon Reflexes: Reflexes are normal and symmetric. Reflexes normal.   Psychiatric:        Mood and Affect: Mood normal.        Behavior:  Behavior normal.        Thought Content: Thought content normal.        Judgment: Judgment normal.     BP 130/86   Pulse 86   Resp 16   Ht 5' 7 (1.702 m)   Wt 119 lb 3.2 oz (54.1 kg)   SpO2 95%   BMI 18.67 kg/m  Wt Readings from Last 3 Encounters:  07/23/23 119 lb 3.2 oz (54.1 kg)  06/27/23 122 lb (55.3 kg)  06/08/23 121 lb 6.4 oz (55.1 kg)    Diabetic Foot Exam - Simple   No data filed    Lab Results  Component Value Date   WBC 3.9 (L) 04/19/2023   HGB 12.9 04/19/2023   HCT 37.6 04/19/2023   PLT 270.0 04/19/2023   GLUCOSE 76 04/19/2023   CHOL 192 04/19/2023   TRIG 94.0 04/19/2023   HDL 61.30 04/19/2023   LDLCALC 111 (H) 04/19/2023   ALT 10 04/19/2023   AST 19 04/19/2023   NA 135 04/19/2023   K 3.6 04/19/2023   CL 99 04/19/2023   CREATININE 0.94  04/19/2023   BUN 11 04/19/2023   CO2 27 04/19/2023   TSH 3.14 04/19/2023   INR 1.0 03/22/2020   HGBA1C 5.8 08/15/2022    Lab Results  Component Value Date   TSH 3.14 04/19/2023   Lab Results  Component Value Date   WBC 3.9 (L) 04/19/2023   HGB 12.9 04/19/2023   HCT 37.6 04/19/2023   MCV 92.7 04/19/2023   PLT 270.0 04/19/2023   Lab Results  Component Value Date   NA 135 04/19/2023   K 3.6 04/19/2023   CO2 27 04/19/2023   GLUCOSE 76 04/19/2023   BUN 11 04/19/2023   CREATININE 0.94 04/19/2023   BILITOT 0.8 04/19/2023   ALKPHOS 79 04/19/2023   AST 19 04/19/2023   ALT 10 04/19/2023   PROT 7.8 04/19/2023   ALBUMIN 4.5 04/19/2023   CALCIUM  9.8 04/19/2023   ANIONGAP 11 04/17/2021   GFR 58.34 (L) 04/19/2023   Lab Results  Component Value Date   CHOL 192 04/19/2023   Lab Results  Component Value Date   HDL 61.30 04/19/2023   Lab Results  Component Value Date   LDLCALC 111 (H) 04/19/2023   Lab Results  Component Value Date   TRIG 94.0 04/19/2023   Lab Results  Component Value Date   CHOLHDL 3 04/19/2023   Lab Results  Component Value Date   HGBA1C 5.8 08/15/2022       Assessment &  Plan:  Chronic obstructive pulmonary disease, unspecified COPD type (HCC) Assessment & Plan: No recent exacerbation   Essential hypertension Assessment & Plan: Well controlled, no changes to meds. Encouraged heart healthy diet such as the DASH diet and exercise as tolerated.    Mixed hyperlipidemia Assessment & Plan: Encourage heart healthy diet such as MIND or DASH diet, increase exercise, avoid trans fats, simple carbohydrates and processed foods, consider a krill or fish or flaxseed oil cap daily.    Osteoporosis, unspecified osteoporosis type, unspecified pathological fracture presence Assessment & Plan: Encouraged to get adequate exercise, calcium  and vitamin d  intake    Preventative health care Assessment & Plan: Patient encouraged to maintain heart healthy diet, regular exercise, adequate sleep. Consider daily probiotics. Take medications as prescribed. Labs ordered and reviewed. MGM in 05/2023. Colonoscopy due later this year. Dexa scan 2021, repeat in 2025 or 26. Aged out of paps Given and reviewed copy of ACP documents from [employer] and encouraged to complete and return  She suffered a fall out of bed and hit her left eye on the side of her night stand and got a black eye. Feels much better now.    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 Caused vaginal and urinary infections and she does not want to use again.   Urinary incontinence, urge Assessment & Plan: She has a urogynecology appt soon to discuss options since she did not tolerate the pessary.     Assessment and Plan Assessment & Plan Pelvic Organ Prolapse Stage 3 prolapse with urinary incontinence. Initial pessary use led to infections. Considering surgical options, including vaginal obliteration, to support bladder without full lift. - Follow up with urogynecology for pre-surgical evaluation and potential  intervention. - Ensure urine testing at urogynecology appointment.  Urinary Tract Infection (UTI) Staph UTI post-pessary use treated with antibiotics. Symptoms resolved, monitor for recurrence. - Monitor for UTI recurrence and seek evaluation if symptoms reappear.  Dental Abscess Soreness and swelling in right lower  tooth area, suspect abscess. Dentist appointment scheduled. - Attend dental appointment for evaluation and management. - Coordinate with dentist regarding antibiotic therapy, considering penicillin allergy.  Change in Bowel Habits Erratic bowel habits with loose stools, family history of colon cancer and microscopic colitis. Colonoscopy scheduled. - Proceed with scheduled colonoscopy.  Fall with Head Injury Fall resulted in head injury and black eye. Symptoms improved, no concussion signs.  General Health Maintenance Up to date with vaccinations and screenings. Blood work well-managed, DEXA scan due 2026. Tetanus booster due March 2026. - Schedule blood work for six months from last test in March. - Ensure tetanus booster by March 2026 or sooner if injured.  Follow-up Ongoing monitoring required for health concerns. - Schedule follow-up in three to six months with blood work during visit. - Contact clinic if new symptoms or concerns arise before follow-up.     Harlene Horton, MD

## 2023-07-23 NOTE — Assessment & Plan Note (Signed)
 She tried a pessary which she did naot tolerate. Caused vaginal and urinary infections and she does not want to use again.

## 2023-07-23 NOTE — Patient Instructions (Addendum)
 Preventive Care 90 Years and Older, Female Preventive care refers to lifestyle choices and visits with your health care provider that can promote health and wellness. Preventive care visits are also called wellness exams.  Tetanus due 03/2024 or sooner if injured What can I expect for my preventive care visit? Counseling Your health care provider may ask you questions about your: Medical history, including: Past medical problems. Family medical history. Pregnancy and menstrual history. History of falls. Current health, including: Memory and ability to understand (cognition). Emotional well-being. Home life and relationship well-being. Sexual activity and sexual health. Lifestyle, including: Alcohol, nicotine or tobacco, and drug use. Access to firearms. Diet, exercise, and sleep habits. Work and work Astronomer. Sunscreen use. Safety issues such as seatbelt and bike helmet use. Physical exam Your health care provider will check your: Height and weight. These may be used to calculate your BMI (body mass index). BMI is a measurement that tells if you are at a healthy weight. Waist circumference. This measures the distance around your waistline. This measurement also tells if you are at a healthy weight and may help predict your risk of certain diseases, such as type 2 diabetes and high blood pressure. Heart rate and blood pressure. Body temperature. Skin for abnormal spots. What immunizations do I need?  Vaccines are usually given at various ages, according to a schedule. Your health care provider will recommend vaccines for you based on your age, medical history, and lifestyle or other factors, such as travel or where you work. What tests do I need? Screening Your health care provider may recommend screening tests for certain conditions. This may include: Lipid and cholesterol levels. Hepatitis C test. Hepatitis B test. HIV (human immunodeficiency virus) test. STI (sexually  transmitted infection) testing, if you are at risk. Lung cancer screening. Colorectal cancer screening. Diabetes screening. This is done by checking your blood sugar (glucose) after you have not eaten for a while (fasting). Mammogram. Talk with your health care provider about how often you should have regular mammograms. BRCA-related cancer screening. This may be done if you have a family history of breast, ovarian, tubal, or peritoneal cancers. Bone density scan. This is done to screen for osteoporosis. Talk with your health care provider about your test results, treatment options, and if necessary, the need for more tests. Follow these instructions at home: Eating and drinking  Eat a diet that includes fresh fruits and vegetables, whole grains, lean protein, and low-fat dairy products. Limit your intake of foods with high amounts of sugar, saturated fats, and salt. Take vitamin and mineral supplements as recommended by your health care provider. Do not drink alcohol if your health care provider tells you not to drink. If you drink alcohol: Limit how much you have to 0-1 drink a day. Know how much alcohol is in your drink. In the [place] one drink equals one 12 oz bottle of beer (355 mL), one 5 oz glass of wine (148 mL), or one 1 oz glass of hard liquor (44 mL). Lifestyle Brush your teeth every morning and night with fluoride toothpaste. Floss one time each day. Exercise for at least 30 minutes 5 or more days each week. Do not use any products that contain nicotine or tobacco. These products include cigarettes, chewing tobacco, and vaping devices, such as e-cigarettes. If you need help quitting, ask your health care provider. Do not use drugs. If you are sexually active, practice safe sex. Use a condom or other form of protection in order to  prevent STIs. Take aspirin  only as told by your health care provider. Make sure that you understand how much to take and what form to take. Work with your  health care provider to find out whether it is safe and beneficial for you to take aspirin  daily. Ask your health care provider if you need to take a cholesterol-lowering medicine (statin). Find healthy ways to manage stress, such as: Meditation, yoga, or listening to music. Journaling. Talking to a trusted person. Spending time with friends and family. Minimize exposure to UV radiation to reduce your risk of skin cancer. Safety Always wear your seat belt while driving or riding in a vehicle. Do not drive: If you have been drinking alcohol. Do not ride with someone who has been drinking. When you are tired or distracted. While texting. If you have been using any mind-altering substances or drugs. Wear a helmet and other protective equipment during sports activities. If you have firearms in your house, make sure you follow all gun safety procedures. What's next? Visit your health care provider once a year for an annual wellness visit. Ask your health care provider how often you should have your eyes and teeth checked. Stay up to date on all vaccines. This information is not intended to replace advice given to you by your health care provider. Make sure you discuss any questions you have with your health care provider. Document Revised: 07/07/2020 Document Reviewed: 07/07/2020 Elsevier Patient Education  2024 [employer].

## 2023-07-23 NOTE — Assessment & Plan Note (Signed)
 She has a urogynecology appt soon to discuss options since she did not tolerate the pessary.

## 2023-07-25 DIAGNOSIS — H34831 Tributary (branch) retinal vein occlusion, right eye, with macular edema: Secondary | ICD-10-CM | POA: Diagnosis not present

## 2023-07-26 ENCOUNTER — Ambulatory Visit: Payer: Medicare HMO | Admitting: *Deleted

## 2023-07-26 VITALS — Ht 67.0 in | Wt 119.0 lb

## 2023-07-26 DIAGNOSIS — Z0000 Encounter for general adult medical examination without abnormal findings: Secondary | ICD-10-CM

## 2023-07-26 NOTE — Patient Instructions (Addendum)
 Suzanne Turner , Thank you for taking time out of your busy schedule to complete your Annual Wellness Visit with me. I enjoyed our conversation and look forward to speaking with you again next year. I, as well as your care team,  appreciate your ongoing commitment to your health goals. Please review the following plan we discussed and let me know if I can assist you in the future. Your Game plan/ To Do List            Follow up Visits: Next Medicare AWV with our clinical staff: 07/30/24 1:40pm    Next Office Visit with your provider: 10/24/23 1:40pm  Clinician Recommendations:  Aim for 30 minutes of exercise or brisk walking, 6-8 glasses of water, and 5 servings of fruits and vegetables each day.       This is a list of the screening recommended for you and due dates:  Health Maintenance  Topic Date Due   Medicare Annual Wellness Visit  07/25/2023   COVID-19 Vaccine (8 - 2024-25 season) 01/23/2024*   Screening for Lung Cancer  07/25/2024*   Flu Shot  08/24/2023   DTaP/Tdap/Td vaccine (2 - Td or Tdap) 03/31/2024   Pneumococcal Vaccine for age over 27  Completed   DEXA scan (bone density measurement)  Completed   Hepatitis C Screening  Completed   Zoster (Shingles) Vaccine  Completed   Hepatitis B Vaccine  Aged Out   HPV Vaccine  Aged Out   Meningitis B Vaccine  Aged Out   Colon Cancer Screening  Discontinued  *Topic was postponed. The date shown is not the original due date.    Advanced directives: (Copy Requested) Please bring a copy of your health care power of attorney and living will to the office to be added to your chart at your convenience. You can mail to [facility] [address] [place], [place] [address] or email to [email or web address] Advance Care Planning is important because it:  [x]  Makes sure you receive the medical care that is consistent with your values, goals, and preferences  [x]  It provides guidance to your family and loved ones and reduces  their decisional burden about whether or not they are making the right decisions based on your wishes.  Follow the link provided in your after visit summary or read over the paperwork we have mailed to you to help you started getting your Advance Directives in place. If you need assistance in completing these, please reach out to us  so that we can help you!  See attachments for Preventive Care and Fall Prevention Tips.

## 2023-07-26 NOTE — Progress Notes (Signed)
 Subjective:   Suzanne Turner is a 78 y.o. who presents for a Medicare Wellness preventive visit.  As a reminder, Annual Wellness Visits don't include a physical exam, and some assessments may be limited, especially if this visit is performed virtually. We may recommend an in-person follow-up visit with your provider if needed.  Visit Complete: Virtual I connected with  Suzanne Turner on 07/26/23 by a audio enabled telemedicine application and verified that I am speaking with the correct person using two identifiers.  Patient Location: Home  Provider Location: Office/Clinic  I discussed the limitations of evaluation and management by telemedicine. The patient expressed understanding and agreed to proceed.  Vital Signs: Because this visit was a virtual/telehealth visit, some criteria may be missing or patient reported. Any vitals not documented were not able to be obtained and vitals that have been documented are patient reported.  VideoDeclined- This patient declined Librarian, academic. Therefore the visit was completed with audio only.  Persons Participating in Visit: Patient.  AWV Questionnaire: Yes: Patient Medicare AWV questionnaire was completed by the patient on 07/19/23; I have confirmed that all information answered by patient is correct and no changes since this date.  Cardiac Risk Factors include: advanced age (>20men, >65 women);dyslipidemia;hypertension;Other (see comment), Risk factor comments: squamous cell carcinoma of leg     Objective:    Today's Vitals   07/26/23 1419  Weight: 119 lb (54 kg)  Height: 5' 7 (1.702 m)   Body mass index is 18.64 kg/m.     07/26/2023    2:40 PM 07/25/2022   10:47 AM 07/21/2021   11:36 AM 07/21/2021   11:09 AM 04/17/2021    5:15 PM 07/15/2020    3:05 PM 07/07/2019    2:37 PM  Advanced Directives  Does Patient Have a Medical Advance Directive? Yes Yes Yes No No Yes Yes  Type of Advance Directive Living  will;Healthcare Power of [employer] Power of [place];Living will Healthcare Power of [place];Living will;Out of facility DNR (pink MOST or yellow form)   Healthcare Power of [place];Living will Healthcare Power of [place];Living will  Does patient want to make changes to medical advance directive? No - Patient declined      No - Patient declined  Copy of Healthcare Power of Attorney in Chart? No - copy requested Yes - validated most recent copy scanned in chart (See row information) Yes - validated most recent copy scanned in chart (See row information)   Yes - validated most recent copy scanned in chart (See row information) Yes - validated most recent copy scanned in chart (See row information)  Would patient like information on creating a medical advance directive?   No - Patient declined No - Patient declined No - Patient declined      Current Medications (verified) Outpatient Encounter Medications as of 07/26/2023  Medication Sig   acetaminophen  (TYLENOL ) 325 MG tablet Take 325 mg by mouth daily as needed for pain.   aspirin  EC 81 MG tablet Take 81 mg by mouth daily.   clindamycin  (CLEOCIN ) 150 MG capsule Take 150 mg by mouth 3 (three) times daily.   hydrochlorothiazide  (HYDRODIURIL ) 25 MG tablet Take 0.5 tablets (12.5 mg total) by mouth 3 (three) times a week.   Multiple Vitamins-Minerals (HAIR/SKIN/NAILS/BIOTIN PO) Take 500 mg by mouth daily.   Na Sulfate-K Sulfate-Mg Sulfate concentrate (SUPREP BOWEL PREP KIT) 17.5-3.13-1.6 GM/177ML SOLN Take 1 kit (354 mLs total) by mouth as directed.   potassium chloride  SA (  KLOR-CON  M) 20 MEQ tablet Take 1 tablet (20 mEq total) by mouth daily.   VYZULTA 0.024 % SOLN Place 1 drop into both eyes at bedtime.   No facility-administered encounter medications on file as of 07/26/2023.    Allergies (verified) Medrol  [methylprednisolone ], Alphagan [brimonidine], Carvedilol , Rhopressa [netarsudil dimesylate], Zioptan [tafluprost (pf)], Amlodipine ,  Lisinopril , Losartan , Penicillins, Sulfonamide derivatives, and Timolol   History: Past Medical History:  Diagnosis Date   Abnormal thyroid  blood test 01/28/2016   Allergy    See attached list   Bronchitis    Cataract    Chronic eczema    COPD (chronic obstructive pulmonary disease) (HCC)    Female bladder prolapse    level 3   Glaucoma 07/28/2014   Left eye Dr Camie Albany at [facility]   H/O measles    H/O mumps    High cholesterol    no medications   History of chicken pox    Hypercalcemia 08/02/2014   Hypertension    Kidney stone    Nummular dermatitis    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   Left eye Dr Camie Albany at [facility]    Osteoarthritis    Osteopenia    Preventative health care 01/30/2016   Salivary duct stone    Skin cancer    Staph infection    after pessary insertion   Urinary incontinence    Vitamin D  deficiency    Wears glasses    Past Surgical History:  Procedure Laterality Date   CATARACT EXTRACTION Bilateral    May 31, 2021 and March 2023   EYE SURGERY  Cataracts 2023   ORIF TIBIA & FIBULA FRACTURES Right 1966   REFRACTIVE SURGERY Left March/April 2018   SALIVARY STONE REMOVAL Right 05/15/2012   Procedure: EXCISION OF RIGHT SUBMANDIBULAR GLAND ;  Surgeon: Ida Loader, MD;  Location: [facility] OR;  Service: ENT;  Laterality: Right;   SALIVARY STONE REMOVAL N/A 11/18/2012   Procedure: INTRAORAL EXCISION SUBMANDIBULAR STONE RIGHT ;  Surgeon: Ida Loader, MD;  Location: [facility];  Service: ENT;  Laterality: N/A;   SKIN BIOPSY Right 12/2012   legs   TONSILLECTOMY  1949   TUBAL LIGATION  1977   Family History  Problem Relation Age of Onset   Arrhythmia Mother    Colon cancer Mother 34   Tremor Mother    Heart disease Mother    Heart disease Father 67   Arthritis Father        broken back with chronic pain   Glaucoma Father    Breast cancer Sister 37   Glaucoma Sister    GER disease Sister     Colitis Sister    Depression Brother    Heart disease Brother    Arrhythmia Brother    Tremor Brother    Hypertension Daughter    Other Son        unclear, maybe heart issue   Colon polyps Maternal Aunt    Arrhythmia Maternal Uncle    Heart disease Maternal Uncle    Arrhythmia Maternal Grandmother    Glaucoma Maternal Grandmother    Bladder Cancer Neg Hx    Uterine cancer Neg Hx    Esophageal cancer Neg Hx    Social History   Socioeconomic History   Marital status: Widowed    Spouse name: Not on file   Number of children: 2   Years of education: Not on file   Highest education level: Bachelor's degree (e.g., BA, AB, BS)  Occupational  History   Occupation: retired  Tobacco Use   Smoking status: Former    Current packs/day: 0.00    Average packs/day: 0.5 packs/day for 40.0 years (20.0 ttl pk-yrs)    Types: Cigarettes    Start date: 04/15/1981    Quit date: 04/15/2021    Years since quitting: 2.2   Smokeless tobacco: Never   Tobacco comments:    Using nicotine lozenges as needed  Vaping Use   Vaping status: Never Used  Substance and Sexual Activity   Alcohol use: No   Drug use: No   Sexual activity: Not Currently    Comment: lives alone, widowed, retired from Print production planner, education. and real estate, no dietary restrictions  Other Topics Concern   Not on file  Social History Narrative   Not on file   Social Drivers of Health   Financial Resource Strain: Low Risk  (07/19/2023)   Overall Financial Resource Strain (CARDIA)    Difficulty of Paying Living Expenses: Not very hard  Food Insecurity: No Food Insecurity (07/19/2023)   Hunger Vital Sign    Worried About Running Out of Food in the Last Year: Never true    Ran Out of Food in the Last Year: Never true  Transportation Needs: No Transportation Needs (07/26/2023)   PRAPARE - Administrator, Civil Service (Medical): No    Lack of Transportation (Non-Medical): No  Physical Activity: Sufficiently  Active (07/19/2023)   Exercise Vital Sign    Days of Exercise per Week: 3 days    Minutes of Exercise per Session: 60 min  Stress: No Stress Concern Present (07/26/2023)   [employer] of Occupational Health - Occupational Stress Questionnaire    Feeling of Stress: Not at all  Recent Concern: Stress - Stress Concern Present (07/19/2023)   [employer] of Occupational Health - Occupational Stress Questionnaire    Feeling of Stress: To some extent  Social Connections: Socially Isolated (07/26/2023)   Social Connection and Isolation Panel    Frequency of Communication with Friends and Family: More than three times a week    Frequency of Social Gatherings with Friends and Family: Twice a week    Attends Religious Services: Never    Database administrator or Organizations: No    Attends Banker Meetings: Never    Marital Status: Widowed    Tobacco Counseling Counseling given: Not Answered Tobacco comments: Using nicotine lozenges as needed    Clinical Intake:  Pre-visit preparation completed: Yes  Pain : No/denies pain     BMI - recorded: 18.64 Nutritional Status: BMI <19  Underweight Nutritional Risks: None Diabetes: No  Lab Results  Component Value Date   HGBA1C 5.8 08/15/2022   HGBA1C 5.7 08/02/2021   HGBA1C 5.6 11/09/2020     How often do you need to have someone help you when you read instructions, pamphlets, or other written materials from your doctor or pharmacy?: 1 - Never What is the last grade level you completed in school?: Bachelor of Science, paralegal certificate in real estate  Interpreter Needed?: No  Information entered by :: Lolita Libra, CMA   Activities of Daily Living     07/19/2023    9:46 AM  In your present state of health, do you have any difficulty performing the following activities:  Hearing? 0  Vision? 1  Difficulty concentrating or making decisions? 0  Walking or climbing stairs? 0  Dressing or bathing? 0   Doing errands, shopping? 1  Comment  Due to vision issues, has been corrected by prism  glasses and no longer an issue to go out on her own  Preparing Food and eating ? N  Using the Toilet? N  In the past six months, have you accidently leaked urine? Y  Comment sees urogyn  Do you have problems with loss of bowel control? N  Managing your Medications? N  Managing your Finances? N  Housekeeping or managing your Housekeeping? N    Patient Care Team: Domenica Harlene LABOR, MD as PCP - General (Family Medicine) Rolan Ezra RAMAN, MD as Consulting Physician (Cardiology) Rosan Credit, MD as Consulting Physician (Ophthalmology) Marsa CROME. Marvis, DDS as Consulting Physician (Dentistry) Carla Milling, RPH-CPP (Pharmacist) Charmayne Molly, MD as Consulting Physician (Ophthalmology)  I have updated your Care Teams any recent Medical Services you may have received from other providers in the past year.     Assessment:   This is a routine wellness examination for Suzanne Turner.  Hearing/Vision screen Hearing Screening - Comments:: Denies hearing difficulties.  Vision Screening - Comments:: Sees Molly Charmayne annually. Eyes are crossed, sees double and wears prism  glasses.   Goals Addressed             This Visit's Progress    Maintain healthy active lifestyle.   On track      Depression Screen     07/26/2023    2:36 PM 08/15/2022    1:36 PM 07/25/2022   11:01 AM 02/14/2022    1:18 PM 01/26/2022   11:15 AM 08/02/2021   11:38 AM 07/21/2021   11:11 AM  PHQ 2/9 Scores  PHQ - 2 Score 1 0 1 0 0 0 0  PHQ- 9 Score    0       Fall Risk     07/19/2023    9:46 AM 08/15/2022    1:36 PM 07/21/2022   10:20 PM 02/14/2022    1:18 PM 01/26/2022   11:14 AM  Fall Risk   Falls in the past year? 1 0 0 0 0  Number falls in past yr: 0 0 0 0 0  Injury with Fall? 1 0 0 0 0  Risk for fall due to :   No Fall Risks  No Fall Risks  Follow up  Falls evaluation completed Falls evaluation completed Falls evaluation  completed  Falls evaluation completed      Data saved with a previous flowsheet row definition    MEDICARE RISK AT HOME:  Medicare Risk at Home Any stairs in or around the home?: (Patient-Rptd) Yes If so, are there any without handrails?: (Patient-Rptd) No Home free of loose throw rugs in walkways, pet beds, electrical cords, etc?: (Patient-Rptd) Yes Adequate lighting in your home to reduce risk of falls?: (Patient-Rptd) Yes Life alert?: (Patient-Rptd) No Use of a cane, walker or w/c?: (Patient-Rptd) No Grab bars in the bathroom?: (Patient-Rptd) Yes Shower chair or bench in shower?: (Patient-Rptd) No Elevated toilet seat or a handicapped toilet?: (Patient-Rptd) No  TIMED UP AND GO:  Was the test performed?  No  Cognitive Function: 6CIT completed    06/29/2016   11:51 AM  MMSE - Mini Mental State Exam  Orientation to time 4   Orientation to Place 5   Registration 3   Attention/ Calculation 4   Recall 3   Language- name 2 objects 2   Language- repeat 1  Language- follow 3 step command 3   Language- read & follow direction 1   Write a sentence 1  Copy design 1   Total score 28      Data saved with a previous flowsheet row definition        07/25/2022   11:03 AM 07/21/2021   11:20 AM 07/15/2020    3:18 PM  6CIT Screen  What Year? 0 points 0 points 0 points  What month? 0 points 0 points 0 points  What time? 0 points 0 points 0 points  Count back from 20 0 points 0 points 0 points  Months in reverse 0 points 0 points 0 points  Repeat phrase 0 points 0 points 2 points  Total Score 0 points 0 points 2 points    Immunizations Immunization History  Administered Date(s) Administered   Fluad Quad(high Dose 65+) 10/20/2019   Influenza Split 10/23/2010, 10/17/2022   Influenza, High Dose Seasonal PF 11/13/2016, 11/13/2017, 10/10/2021   Influenza,inj,Quad PF,6+ Mos 12/11/2012   Influenza-Unspecified 11/05/2013, 11/16/2014, 10/08/2020   Moderna Covid-19 Vaccine Bivalent  Booster 94yrs & up 10/08/2020   Moderna SARS-COV2 Booster Vaccination 10/08/2020, 07/26/2022   PFIZER(Purple Top)SARS-COV-2 Vaccination 03/01/2019, 03/26/2019, 11/22/2019, 05/21/2020   PNEUMOCOCCAL CONJUGATE-20 03/02/2023   Pfizer Covid-19 Vaccine Bivalent Booster 35yrs & up 10/25/2021, 11/16/2022   Pfizer(Comirnaty)Fall Seasonal Vaccine 12 years and older 11/19/2022   Pneumococcal Conjugate-13 06/23/2010, 05/11/2014   Respiratory Syncytial Virus Vaccine,Recomb Aduvanted(Arexvy) 11/11/2021   Tdap 04/01/2014   Zoster Recombinant(Shingrix) 10/12/2017, 12/14/2017   Zoster, Live 07/24/2006    Screening Tests Health Maintenance  Topic Date Due   Medicare Annual Wellness (AWV)  07/25/2023   COVID-19 Vaccine (8 - 2024-25 season) 01/23/2024 (Originally 01/14/2023)   Lung Cancer Screening  07/25/2024 (Originally 03/22/2021)   INFLUENZA VACCINE  08/24/2023   DTaP/Tdap/Td (2 - Td or Tdap) 03/31/2024   Pneumococcal Vaccine: 50+ Years  Completed   DEXA SCAN  Completed   Hepatitis C Screening  Completed   Zoster Vaccines- Shingrix  Completed   Hepatitis B Vaccines  Aged Out   HPV VACCINES  Aged Out   Meningococcal B Vaccine  Aged Out   Colonoscopy  Discontinued    Health Maintenance  Health Maintenance Due  Topic Date Due   Medicare Annual Wellness (AWV)  07/25/2023   Health Maintenance Items Addressed: Declines lung cancer screening. Doesn't want DEXA until sometime next year.  Additional Screening:  Vision Screening: Recommended annual ophthalmology exams for early detection of glaucoma and other disorders of the eye. Would you like a referral to an eye doctor? No    Dental Screening: Recommended annual dental exams for proper oral hygiene  Community Resource Referral / Chronic Care Management: CRR required this visit?  No   CCM required this visit?  No   Plan:    I have personally reviewed and noted the following in the patient's chart:   Medical and social history Use  of alcohol, tobacco or illicit drugs  Current medications and supplements including opioid prescriptions. Patient is not currently taking opioid prescriptions. Functional ability and status Nutritional status Physical activity Advanced directives List of other physicians Hospitalizations, surgeries, and ER visits in previous 12 months Vitals Screenings to include cognitive, depression, and falls Referrals and appointments  In addition, I have reviewed and discussed with patient certain preventive protocols, quality metrics, and best practice recommendations. A written personalized care plan for preventive services as well as general preventive health recommendations were provided to patient.   Lolita Libra, CMA   07/26/2023   After Visit Summary: (MyChart) Due to this being a telephonic visit, the after visit  summary with patients personalized plan was offered to patient via MyChart   Notes: Nothing significant to report at this time.

## 2023-07-30 ENCOUNTER — Ambulatory Visit: Admitting: Obstetrics and Gynecology

## 2023-08-07 ENCOUNTER — Ambulatory Visit (INDEPENDENT_AMBULATORY_CARE_PROVIDER_SITE_OTHER): Admitting: Obstetrics and Gynecology

## 2023-08-07 ENCOUNTER — Encounter: Payer: Self-pay | Admitting: Obstetrics and Gynecology

## 2023-08-07 VITALS — BP 173/103 | HR 81

## 2023-08-07 DIAGNOSIS — N393 Stress incontinence (female) (male): Secondary | ICD-10-CM | POA: Diagnosis not present

## 2023-08-07 DIAGNOSIS — R339 Retention of urine, unspecified: Secondary | ICD-10-CM

## 2023-08-07 DIAGNOSIS — N3281 Overactive bladder: Secondary | ICD-10-CM | POA: Diagnosis not present

## 2023-08-07 DIAGNOSIS — R948 Abnormal results of function studies of other organs and systems: Secondary | ICD-10-CM

## 2023-08-07 DIAGNOSIS — N3941 Urge incontinence: Secondary | ICD-10-CM

## 2023-08-07 DIAGNOSIS — N8110 Cystocele, unspecified: Secondary | ICD-10-CM

## 2023-08-07 DIAGNOSIS — R350 Frequency of micturition: Secondary | ICD-10-CM

## 2023-08-07 LAB — POCT URINALYSIS DIP (CLINITEK)
Bilirubin, UA: NEGATIVE
Blood, UA: NEGATIVE
Glucose, UA: NEGATIVE mg/dL
Ketones, POC UA: NEGATIVE mg/dL
Leukocytes, UA: NEGATIVE
Nitrite, UA: NEGATIVE
POC PROTEIN,UA: NEGATIVE
Spec Grav, UA: 1.01
Urobilinogen, UA: 0.2 U/dL
pH, UA: 7

## 2023-08-07 NOTE — Progress Notes (Signed)
 [facility] Urodynamics Procedure  Referring Physician: Domenica Harlene LABOR, MD Date of Procedure: 08/07/2023  Suzanne Turner is a 78 y.o. female who presents for urodynamic evaluation. Indication(s) for study: mixed incontinence  Vital Signs: BP (!) 173/103   Pulse 81   Laboratory Results: A catheterized urine specimen revealed:  POC urine:  Lab Results  Component Value Date   COLORU yellow 08/07/2023   CLARITYU clear 08/07/2023   GLUCOSEUR negative 08/07/2023   BILIRUBINUR negative 08/07/2023   KETONESU negatuve 04/27/2023   SPECGRAV 1.010 08/07/2023   RBCUR negative 08/07/2023   PHUR 7.0 08/07/2023   PROTEINUR Negative 04/27/2023   UROBILINOGEN 0.2 08/07/2023   LEUKOCYTESUR Negative 08/07/2023     Voiding Diary: Deferred  Procedure Timeout:  The correct patient was verified and the correct procedure was verified. The patient was in the correct position and safety precautions were reviewed based on at the patient's history.  Urodynamic Procedure A 23F dual lumen urodynamics catheter was placed under sterile conditions into the patient's bladder. A 23F catheter was placed into the rectum in order to measure abdominal pressure. EMG patches were placed in the appropriate position.  All connections were confirmed and calibrations/adjusted made. Saline was instilled into the bladder through the dual lumen catheters.  Cough/valsalva pressures were measured periodically during filling.  Patient was allowed to void.  The bladder was then emptied of its residual.  UROFLOW: Revealed a Qmax of 6.5 mL/sec.  She voided 38 mL and had a residual of 250 mL.  It was a normal pattern and represented normal habits though interpretation limited due to low voided volume.  CMG: This was performed with sterile water in the sitting position at a fill rate of 30 mL/min.    First sensation of fullness was  64 mLs,  First urge was 78 mLs,  Strong urge was 116 mLs and  Capacity was  436 mLs  Stress incontinence was demonstrated Highest positive Barrier CLPP was 73 cmH20 at 200 ml. Highest negative Barrier VLPP was 51 cmH20 at 200 ml.  Detrusor function was overactive, with phasic contractions seen.  The first occurred at 39 mL to 4 cm of water and was associated with urge.  Compliance:  Low. End fill detrusor pressure was 21cmH20.  Calculated compliance was 26mL/cmH20  UPP: MUCP with barrier reduction was 63 cm of water.    MICTURITION STUDY: Voiding was performed with reduction using scopettes in the sitting position.  Pdet at Qmax was 63 cm of water.  Qmax was 20 mL/sec.  It was a interrupted pattern.  She voided 361 mL and had a residual of 75 mL.  It was a volitional void, sustained detrusor contraction was present and abdominal straining was present  EMG: This was performed with patches.  She had voluntary contractions, recruitment with fill was present and urethral sphincter was not relaxed with void.  The details of the procedure with the study tracings have been scanned into EPIC.   Urodynamic Impression:  1. Sensation was increased; capacity was normal 2. Stress Incontinence was demonstrated at normal pressures; 3. Detrusor Overactivity was demonstrated without leakage. 4. Emptying was dysfunctional with a normal PVR during micturition study but elevated with initial uroflow, a sustained detrusor contraction present,  abdominal straining present, dyssynergic urethral sphincter activity on EMG.  Plan: - The patient will follow up  to discuss the findings and treatment options.

## 2023-08-12 ENCOUNTER — Encounter: Payer: Self-pay | Admitting: Family Medicine

## 2023-08-13 NOTE — Progress Notes (Deleted)
 [facility] Return Visit  SUBJECTIVE  History of Present Illness: Suzanne Turner is a 78 y.o. female seen in follow-up for stage III pelvic organ prolapse, urgency urinary incontinence, incomplete bladder emptying, fecal incontinence, and vaginal atrophy. Plan at last visit was trial of #3 donut pessary, start vaginal estrogen, and FODMAP diet.   Previously desired to avoid surgical intervention, now desires colpocleisis Tried #3 donut pessary with BV, yeast infection, and Staph UTI. Negative Nuswab 03/27/23, positive for BV and candida on 04/27/23 Rx macrobid  04/27/23 due to UTI symptoms. UA + nitrite and culture with 70K Staph epidermidis resistant to oxacillin Denies PMB or history of abnormal pap smear  Urodynamic Impression 08/07/23:  1. Sensation was increased; capacity was normal 2. Stress Incontinence was demonstrated at normal pressures; 3. Detrusor Overactivity was demonstrated without leakage. 4. Emptying was dysfunctional with a normal PVR during micturition study but elevated with initial uroflow, a sustained detrusor contraction present,  abdominal straining present, dyssynergic urethral sphincter activity on EMG. Pdet at Qmax was 63 cm of water.  Qmax was 20 mL/sec.  It was an interrupted pattern.  She voided 361 mL and had a residual of 75 mL.   Past Medical History: Patient  has a past medical history of Abnormal thyroid  blood test (01/28/2016), Allergy, Bronchitis, Cataract, Chronic eczema, COPD (chronic obstructive pulmonary disease) (HCC), Female bladder prolapse, Glaucoma (07/28/2014), H/O measles, H/O mumps, High cholesterol, History of chicken pox, Hypercalcemia (08/02/2014), Hypertension, Kidney stone, Nummular dermatitis, Open-angle glaucoma (07/28/2014), Osteoarthritis, Osteopenia, Preventative health care (01/30/2016), Salivary duct stone, Skin cancer, Staph infection, Urinary incontinence, Vitamin D  deficiency, and Wears glasses.   Past Surgical  History: She  has a past surgical history that includes Tonsillectomy (1949); Tubal ligation (1977); Salivary stone removal (Right, 05/15/2012); ORIF tibia & fibula fractures (Right, 1966); Salivary stone removal (N/A, 11/18/2012); Skin biopsy (Right, 12/2012); Refractive surgery (Left, March/April 2018); Cataract extraction (Bilateral); and Eye surgery (Cataracts 2023).   Medications: She has a current medication list which includes the following prescription(s): acetaminophen , aspirin  ec, clindamycin , hydrochlorothiazide , multiple vitamins-minerals, na sulfate-k sulfate-mg sulfate concentrate, potassium chloride  sa, and vyzulta.   Allergies: Patient is allergic to medrol  [methylprednisolone ], alphagan [brimonidine], carvedilol , rhopressa [netarsudil dimesylate], zioptan [tafluprost (pf)], amlodipine , lisinopril , losartan , penicillins, sulfonamide derivatives, and timolol.   Social History: Patient  reports that she quit smoking about 2 years ago. Her smoking use included cigarettes. She started smoking about 42 years ago. She has a 20 pack-year smoking history. She has never used smokeless tobacco. She reports that she does not drink alcohol and does not use drugs.     OBJECTIVE     Physical Exam: There were no vitals filed for this visit. Gen: No apparent distress, A&O x 3.  Detailed Urogynecologic Evaluation:  Deferred. Prior exam showed:      No data to display             ASSESSMENT AND PLAN    Suzanne Turner is a 78 y.o. with:  No diagnosis found.  There are no diagnoses linked to this encounter.   Lianne ONEIDA Gillis, MD

## 2023-08-13 NOTE — Telephone Encounter (Signed)
 Do you want pt to come in for a visit or schedule a NV BP check ? Last appointment was 07/23/2023

## 2023-08-13 NOTE — Telephone Encounter (Signed)
 Pt called and scheduled for 08/15/23

## 2023-08-14 ENCOUNTER — Ambulatory Visit: Admitting: Obstetrics

## 2023-08-15 ENCOUNTER — Ambulatory Visit (INDEPENDENT_AMBULATORY_CARE_PROVIDER_SITE_OTHER)

## 2023-08-15 DIAGNOSIS — I10 Essential (primary) hypertension: Secondary | ICD-10-CM | POA: Diagnosis not present

## 2023-08-15 MED ORDER — SPIRONOLACTONE 25 MG PO TABS: 25.0000 mg | ORAL_TABLET | Freq: Every day | ORAL | 3 refills | Status: DC

## 2023-08-15 NOTE — Progress Notes (Signed)
 Pt here for Blood pressure check per Dr.Blyth  Pt currently takes: Hydrodiuril  25mg  twice a week, order is three times a week.   Pt reports compliance with medication, has not taken medication today stated will take it later on today, also states medication makes her use the bathroom more often.  BP today @ =   158/88 left arm Right arm - 160/84  HR= 77  5 Minute wait  Left arm= 166/78 Right arm= 156/82  Pt advised per DOD Dr.Wendling start  spironolactone  25 mg daily , 1 week check BMP and follow up with PCP or Harlene GRADE, DNP in 2-3 weeks.  Patient called notified of medication change , made lab appointment and stated she will call back to schedule her 2-3 week appointment with Harlene GRADE, DNP

## 2023-08-21 ENCOUNTER — Ambulatory Visit: Admitting: Obstetrics

## 2023-08-21 ENCOUNTER — Encounter: Payer: Self-pay | Admitting: Family Medicine

## 2023-08-21 ENCOUNTER — Encounter: Admitting: Gastroenterology

## 2023-08-23 ENCOUNTER — Ambulatory Visit: Admitting: Obstetrics

## 2023-08-23 ENCOUNTER — Ambulatory Visit: Payer: Self-pay | Admitting: Family Medicine

## 2023-08-23 ENCOUNTER — Encounter: Payer: Self-pay | Admitting: Obstetrics

## 2023-08-23 ENCOUNTER — Other Ambulatory Visit (INDEPENDENT_AMBULATORY_CARE_PROVIDER_SITE_OTHER)

## 2023-08-23 VITALS — BP 169/93 | HR 73

## 2023-08-23 DIAGNOSIS — N8110 Cystocele, unspecified: Secondary | ICD-10-CM | POA: Diagnosis not present

## 2023-08-23 DIAGNOSIS — N952 Postmenopausal atrophic vaginitis: Secondary | ICD-10-CM

## 2023-08-23 DIAGNOSIS — N3946 Mixed incontinence: Secondary | ICD-10-CM | POA: Diagnosis not present

## 2023-08-23 DIAGNOSIS — R339 Retention of urine, unspecified: Secondary | ICD-10-CM

## 2023-08-23 DIAGNOSIS — I10 Essential (primary) hypertension: Secondary | ICD-10-CM | POA: Diagnosis not present

## 2023-08-23 DIAGNOSIS — R159 Full incontinence of feces: Secondary | ICD-10-CM

## 2023-08-23 LAB — BASIC METABOLIC PANEL WITH GFR
BUN: 14 mg/dL (ref 6–23)
CO2: 28 meq/L (ref 19–32)
Calcium: 9.6 mg/dL (ref 8.4–10.5)
Chloride: 99 meq/L (ref 96–112)
Creatinine, Ser: 0.91 mg/dL (ref 0.40–1.20)
GFR: 60.51 mL/min (ref >60.00)
Glucose, Bld: 76 mg/dL (ref 70–99)
Potassium: 3.4 meq/L — ABNORMAL LOW (ref 3.5–5.1)
Sodium: 133 meq/L — ABNORMAL LOW (ref 135–145)

## 2023-08-23 NOTE — Progress Notes (Signed)
 [facility] Return Visit  SUBJECTIVE  History of Present Illness: KAITLYNN TRAMONTANA is a 78 y.o. female seen in follow-up for stage III pelvic organ prolapse, urgency urinary incontinence, incomplete bladder emptying, fecal incontinence, and vaginal atrophy. Plan at last visit was trial of #3 donut pessary, vaginal estrogen, and FODMAP diet.   Previously desired to avoid surgical intervention, considering colpocleisis Tried #3 donut pessary with BV, yeast infection, and Staph UTI. Negative Nuswab 03/27/23, positive for BV and candida on 04/27/23 Rx macrobid  04/27/23 due to UTI symptoms. UA + nitrite and culture with 70K Staph epidermidis resistant to oxacillin Using vaginal estrogen 0.5g twice a day Denies PMB or history of abnormal pap smear Recent tooth abscess with Clindamycin  use, reports improvement of fecal incontinence symptoms.  Declines additional vaginal manipulation, PT due to infections from pessary trial  Urodynamic Impression 08/07/23:  1. Sensation was increased; capacity was normal 2. Stress Incontinence was demonstrated at normal pressures; 3. Detrusor Overactivity was demonstrated without leakage. 4. Emptying was dysfunctional with a normal PVR during micturition study but elevated with initial uroflow, a sustained detrusor contraction present,  abdominal straining present, dyssynergic urethral sphincter activity on EMG. Pdet at Qmax was 63 cm of water.  Qmax was 20 mL/sec.  It was an interrupted pattern.  She voided 361 mL and had a residual of 75 mL.   Past Medical History: Patient  has a past medical history of Abnormal thyroid  blood test (01/28/2016), Allergy, Bronchitis, Cataract, Chronic eczema, COPD (chronic obstructive pulmonary disease) (HCC), Female bladder prolapse, Glaucoma (07/28/2014), H/O measles, H/O mumps, High cholesterol, History of chicken pox, Hypercalcemia (08/02/2014), Hypertension, Kidney stone, Nummular dermatitis, Open-angle glaucoma  (07/28/2014), Osteoarthritis, Osteopenia, Preventative health care (01/30/2016), Salivary duct stone, Skin cancer, Staph infection, Urinary incontinence, Vitamin D  deficiency, and Wears glasses.   Past Surgical History: She  has a past surgical history that includes Tonsillectomy (1949); Tubal ligation (1977); Salivary stone removal (Right, 05/15/2012); ORIF tibia & fibula fractures (Right, 1966); Salivary stone removal (N/A, 11/18/2012); Skin biopsy (Right, 12/2012); Refractive surgery (Left, March/April 2018); Cataract extraction (Bilateral); and Eye surgery (Cataracts 2023).   Medications: She has a current medication list which includes the following prescription(s): acetaminophen , aspirin  ec, clindamycin , multiple vitamins-minerals, na sulfate-k sulfate-mg sulfate concentrate, potassium chloride  sa, and vyzulta.   Allergies: Patient is allergic to medrol  [methylprednisolone ], alphagan [brimonidine], carvedilol , rhopressa [netarsudil dimesylate], zioptan [tafluprost (pf)], amlodipine , lisinopril , losartan , penicillins, sulfonamide derivatives, and timolol.   Social History: Patient  reports that she quit smoking about 2 years ago. Her smoking use included cigarettes. She started smoking about 42 years ago. She has a 20 pack-year smoking history. She has never used smokeless tobacco. She reports that she does not drink alcohol and does not use drugs.     OBJECTIVE     Physical Exam: Vitals:   08/23/23 1338 08/23/23 1443  BP: (!) 174/92 (!) 169/93  Pulse: 80 73   Gen: No apparent distress, A&O x 3.  Detailed Urogynecologic Evaluation:  Deferred.       ASSESSMENT AND PLAN    Ms. Cavness is a 78 y.o. with:  1. Pelvic organ prolapse quantification stage 3 cystocele   2. Vaginal atrophy   3. Incomplete bladder emptying   4. Incontinence of feces, unspecified fecal incontinence type   5. Mixed stress and urge urinary incontinence     Pelvic organ prolapse quantification stage 3  cystocele Assessment & Plan: - incomplete bladder emptying with normal PVR 75mL on UDS - For  treatment of pelvic organ prolapse, we discussed options for management including expectant management, conservative management, and surgical management, such as Kegels, a pessary, pelvic floor physical therapy, and specific surgical procedures. - reports symptomatic relief with #3 donut pessary, however declines replacement due to vaginal and urinary tract infection - patient previously desired to avoid surgical intervention, however desires to review surgical options We discussed two options for prolapse repair:  1) vaginal repair without mesh - Pros - safer, no mesh complications - Cons - not as strong as mesh repair, higher risk of recurrence  2) laparoscopic repair with mesh - Pros - stronger, better long-term success - Cons - risks of mesh implant (erosion into vagina or bladder, adhering to the rectum, pain) - these risks are lower than with a vaginal mesh but still exist  3) Vaginal closure procedure - pros - strong, good long-term success - cons - unable to have penetrative intercourse - considering vaginal closure procedure. Discussed risks and benefits of hysterectomy. Patient considering hysterectomy - encouraged splinting and Kegels  - desires expectant management at this time until clinical change   Vaginal atrophy Assessment & Plan: - irritation from pad use and history of UTI - For symptomatic vaginal atrophy options include lubrication with a water-based lubricant, personal hygiene measures and barrier protection against wetness, and estrogen replacement in the form of vaginal cream, vaginal tablets, or a time-released vaginal ring.   - encouraged barrier ointment use (Vaseline) and provided samples for moisturizers - reduce vaginal estrogen to 1g twice a week from daily   Incomplete bladder emptying Assessment & Plan: - 03/05/23 elevated PVR, catheterized for . UA  negative - likely due to outlet obstruction from stage III pelvic organ prolapse - encouraged splinting and position change to ensure bladder emptying - UDS 08/07/23 with increased sensation, MCC , + SUI, + terminal DO with position change. Catheter expelled during pressure flow with Qmax 52mL/sec with interrupted pattern. Voided and PVR 75mL - patient desires to avoid surgical intervention and desires expectant management unless clinical change   Incontinence of feces, unspecified fecal incontinence type Assessment & Plan: - resolved since Clindamycin  for tooth abscess - reports dietary triggers such as diary and broccoli - previously encouraged FODMAP diet, IBGuard, and low dose fiber supplementation - pad use for fecal smearing - Treatment options include anti-diarrhea medication (loperamide/ Imodium OTC or prescription lomotil), fiber supplements, physical therapy, and possible sacral neuromodulation or surgery.     Mixed stress and urge urinary incontinence Assessment & Plan: - We discussed the symptoms of overactive bladder (OAB), which include urinary urgency, urinary frequency, nocturia, with or without urge incontinence.  While we do not know the exact etiology of OAB, several treatment options exist. We discussed management including behavioral therapy (decreasing bladder irritants, urge suppression strategies, timed voids, bladder retraining), physical therapy, medication; for refractory cases posterior tibial nerve stimulation, sacral neuromodulation, and intravesical botulinum toxin injection.  For anticholinergic medications, we discussed the potential side effects of anticholinergics including dry eyes, dry mouth, constipation, cognitive impairment and urinary retention. For Beta-3 agonist medication, we discussed the potential side effect of elevated blood pressure which is more likely to occur in individuals with uncontrolled hypertension. - encouraged fluid management  and Kegel exercises - For treatment of stress urinary incontinence,  non-surgical options include expectant management, weight loss, physical therapy, as well as a pessary.  Surgical options include a midurethral sling, Burch urethropexy, and transurethral injection of a bulking agent. - discussed an office procedure with  urethral bulking (Bulkamid). We discussed success rate of approximately 70-80% and possible need for second injection. We reviewed that this is not a permanent procedure and the Bulkamid does become less effective over time. Risks reviewed including injury to bladder or urethra, UTI, urinary retention and hematuria.  - Sling: The effectiveness of a midurethral vaginal mesh sling is approximately 85%, and thus, there will be times when you may leak urine after surgery, especially if your bladder is full or if you have a strong cough. There is a balance between making the sling tight enough to treat your leakage but not too tight so that you have long-term difficulty emptying your bladder. A mesh sling will not directly treat overactive bladder/urge incontinence and may worsen it.  There is an FDA safety notification on vaginal mesh procedures for prolapse but NOT mesh slings. We have extensive experience and training with mesh placement and we have close postoperative follow up to identify any potential complications from mesh. It is important to realize that this mesh is a permanent implant that cannot be easily removed. There are rare risks of mesh exposure (2-4%), pain with intercourse (0-7%), and infection (<1%). The risk of mesh exposure if more likely in a woman with risks for poor healing (prior radiation, poorly controlled diabetes, or immunocompromised). The risk of new or worsened chronic pain after mesh implant is more common in women with baseline chronic pain and/or poorly controlled anxiety or depression. Approximately 2-4% of patients will experience longer-term post-operative  voiding dysfunction that may require surgical revision of the sling. We also reviewed that postoperatively, her stream may not be as strong as before surgery.  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    Time spent: I spent 53 minutes dedicated to the care of this patient on the date of this encounter to include pre-visit review of records, face-to-face time with the patient discussing stage III pelvic organ prolapse, mixed urinary incontinence, fecal incontinence, vaginal atrophy, incomplete bladder emptying, and post visit documentation and ordering medication/ testing.   Lianne ONEIDA Gillis, MD

## 2023-08-23 NOTE — Assessment & Plan Note (Signed)
-   resolved since Clindamycin  for tooth abscess - reports dietary triggers such as diary and broccoli - previously encouraged FODMAP diet, IBGuard, and low dose fiber supplementation - pad use for fecal smearing - Treatment options include anti-diarrhea medication (loperamide/ Imodium OTC or prescription lomotil), fiber supplements, physical therapy, and possible sacral neuromodulation or surgery.

## 2023-08-23 NOTE — Assessment & Plan Note (Addendum)
-   03/05/23 elevated PVR, catheterized for . UA negative - likely due to outlet obstruction from stage III pelvic organ prolapse - encouraged splinting and position change to ensure bladder emptying - UDS 08/07/23 with increased sensation, MCC , + SUI, + terminal DO with position change. Catheter expelled during pressure flow with Qmax 57mL/sec with interrupted pattern. Voided and PVR 75mL - patient desires to avoid surgical intervention and desires expectant management unless clinical change

## 2023-08-23 NOTE — Patient Instructions (Signed)
 Reduce your vaginal estrogen to 1g twice a WEEK, not twice a day.   - discussed proper vulvar care, warm compression, avoid pad use, cotton only underwear and barrier ointment if needed   You can use Vitamin E suppository as needed for protection of your tissue  You can use vaginal moisturizers such as Revaree or Replens.

## 2023-08-23 NOTE — Assessment & Plan Note (Signed)
-   irritation from pad use and history of UTI - For symptomatic vaginal atrophy options include lubrication with a water-based lubricant, personal hygiene measures and barrier protection against wetness, and estrogen replacement in the form of vaginal cream, vaginal tablets, or a time-released vaginal ring.   - encouraged barrier ointment use (Vaseline) and provided samples for moisturizers - reduce vaginal estrogen to 1g twice a week from daily

## 2023-08-23 NOTE — Assessment & Plan Note (Signed)
-   We discussed the symptoms of overactive bladder (OAB), which include urinary urgency, urinary frequency, nocturia, with or without urge incontinence.  While we do not know the exact etiology of OAB, several treatment options exist. We discussed management including behavioral therapy (decreasing bladder irritants, urge suppression strategies, timed voids, bladder retraining), physical therapy, medication; for refractory cases posterior tibial nerve stimulation, sacral neuromodulation, and intravesical botulinum toxin injection.  For anticholinergic medications, we discussed the potential side effects of anticholinergics including dry eyes, dry mouth, constipation, cognitive impairment and urinary retention. For Beta-3 agonist medication, we discussed the potential side effect of elevated blood pressure which is more likely to occur in individuals with uncontrolled hypertension. - encouraged fluid management and Kegel exercises - For treatment of stress urinary incontinence,  non-surgical options include expectant management, weight loss, physical therapy, as well as a pessary.  Surgical options include a midurethral sling, Burch urethropexy, and transurethral injection of a bulking agent. - discussed an office procedure with urethral bulking (Bulkamid). We discussed success rate of approximately 70-80% and possible need for second injection. We reviewed that this is not a permanent procedure and the Bulkamid does become less effective over time. Risks reviewed including injury to bladder or urethra, UTI, urinary retention and hematuria.  - Sling: The effectiveness of a midurethral vaginal mesh sling is approximately 85%, and thus, there will be times when you may leak urine after surgery, especially if your bladder is full or if you have a strong cough. There is a balance between making the sling tight enough to treat your leakage but not too tight so that you have long-term difficulty emptying your  bladder. A mesh sling will not directly treat overactive bladder/urge incontinence and may worsen it.  There is an FDA safety notification on vaginal mesh procedures for prolapse but NOT mesh slings. We have extensive experience and training with mesh placement and we have close postoperative follow up to identify any potential complications from mesh. It is important to realize that this mesh is a permanent implant that cannot be easily removed. There are rare risks of mesh exposure (2-4%), pain with intercourse (0-7%), and infection (<1%). The risk of mesh exposure if more likely in a woman with risks for poor healing (prior radiation, poorly controlled diabetes, or immunocompromised). The risk of new or worsened chronic pain after mesh implant is more common in women with baseline chronic pain and/or poorly controlled anxiety or depression. Approximately 2-4% of patients will experience longer-term post-operative voiding dysfunction that may require surgical revision of the sling. We also reviewed that postoperatively, her stream may not be as strong as before surgery.  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

## 2023-08-23 NOTE — Assessment & Plan Note (Signed)
-   incomplete bladder emptying with normal PVR 75mL on UDS - For treatment of pelvic organ prolapse, we discussed options for management including expectant management, conservative management, and surgical management, such as Kegels, a pessary, pelvic floor physical therapy, and specific surgical procedures. - reports symptomatic relief with #3 donut pessary, however declines replacement due to vaginal and urinary tract infection - patient previously desired to avoid surgical intervention, however desires to review surgical options We discussed two options for prolapse repair:  1) vaginal repair without mesh - Pros - safer, no mesh complications - Cons - not as strong as mesh repair, higher risk of recurrence  2) laparoscopic repair with mesh - Pros - stronger, better long-term success - Cons - risks of mesh implant (erosion into vagina or bladder, adhering to the rectum, pain) - these risks are lower than with a vaginal mesh but still exist  3) Vaginal closure procedure - pros - strong, good long-term success - cons - unable to have penetrative intercourse - considering vaginal closure procedure. Discussed risks and benefits of hysterectomy. Patient considering hysterectomy - encouraged splinting and Kegels  - desires expectant management at this time until clinical change

## 2023-08-31 ENCOUNTER — Other Ambulatory Visit: Payer: Self-pay | Admitting: Family

## 2023-08-31 DIAGNOSIS — I10 Essential (primary) hypertension: Secondary | ICD-10-CM

## 2023-08-31 NOTE — Assessment & Plan Note (Signed)
 Encourage heart healthy diet such as MIND or DASH diet, increase exercise, avoid trans fats, simple carbohydrates and processed foods, consider a krill or fish or flaxseed oil cap daily.

## 2023-08-31 NOTE — Assessment & Plan Note (Signed)
 Denies recent exacerbation.

## 2023-08-31 NOTE — Assessment & Plan Note (Signed)
 Update CBC.  Increase leafy greens, consider increased lean red meat and using cast iron cookware. Continue to monitor, report any concerns

## 2023-08-31 NOTE — Progress Notes (Addendum)
 Subjective:     Patient ID: Suzanne Turner, female    DOB: 1945-04-15, 78 y.o.   MRN: [id number]  Chief Complaint  Patient presents with   Follow-up    HPI  Suzanne Turner is a 78 year old female presents for follow-up chronic conditions.  Follows with uro-GYN, GI,  HTN She has not been taking spirolactone- c/Io side effects muscle cramps. She has bene taking hydrochlorothiazide  12.5 mg daily.  Pt reports BPs at home are high- >140/90  BP Readings from Last 3 Encounters:  09/05/23 (!) 158/102  08/23/23 (!) 169/93  08/07/23 (!) 173/103     Stage III bladder prolapse-follows with uro-GYN-Dr. Charlane  Was recently seen by uro-GYN and was given extensive discussion on conservative versus surgical treatment plans.  At this time patient prefers to manage symptoms conservatively. She has been doing online physical therapy, Dr. Illona- pelvic exercises, yoga based.  Diarrhea-follows with GI- Vito V Cirigliano, DO  Per last GI note: Trial low FODMAP diet.  Provided with handout and detailed instructions today - Trial OTC probiotic - Start OTC fiber supplement - Colonoscopy to evaluate for mucosal/luminal pathology with random and directed biopsies.  To be done with pediatric colonoscope   Patient denies fever, chills, SOB, CP, palpitations, dyspnea, edema, HA, vision changes, N/V/D, abdominal pain, urinary symptoms, rash, weight changes, and recent illness or hospitalizations.   History of Present Illness            There are no preventive care reminders to display for this patient.   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   Left eye Dr Camie Albany at [facility]    Osteoarthritis    Osteopenia    Preventative health care 01/30/2016   Salivary duct stone    Skin cancer    Staph infection    after pessary insertion   Urinary incontinence    Vitamin D  deficiency    Wears glasses     Past Surgical History:  Procedure Laterality Date   CATARACT EXTRACTION Bilateral    May 31, 2021 and March 2023   EYE SURGERY  Cataracts 2023   ORIF TIBIA & FIBULA FRACTURES Right 1966   REFRACTIVE SURGERY Left March/April 2018   SALIVARY STONE REMOVAL Right 05/15/2012   Procedure: EXCISION OF RIGHT SUBMANDIBULAR GLAND ;  Surgeon: Ida Loader, MD;  Location: [facility] OR;  Service: ENT;  Laterality: Right;   SALIVARY STONE REMOVAL N/A 11/18/2012   Procedure: INTRAORAL EXCISION SUBMANDIBULAR STONE RIGHT ;  Surgeon: Ida Loader, MD;  Location: [facility];  Service: ENT;  Laterality: N/A;   SKIN BIOPSY Right 12/2012   legs   TONSILLECTOMY  1949   TUBAL LIGATION  1977    Family History  Problem Relation Age of Onset   Arrhythmia Mother    Colon cancer Mother 78  Tremor Mother    Heart disease Mother    Heart disease Father 61   Arthritis Father        broken back with chronic pain   Glaucoma Father    Breast cancer Sister 48   Glaucoma Sister    GER disease Sister    Colitis Sister    Depression Brother    Heart disease Brother    Arrhythmia Brother    Tremor Brother    Hypertension Daughter    Other Son        unclear, maybe heart issue   Colon polyps Maternal Aunt    Arrhythmia Maternal Uncle    Heart disease Maternal Uncle    Arrhythmia Maternal Grandmother    Glaucoma Maternal Grandmother    Bladder Cancer Neg Hx    Uterine cancer Neg Hx    Esophageal cancer Neg Hx     Social History   Socioeconomic History    Marital status: Widowed    Spouse name: Not on file   Number of children: 2   Years of education: Not on file   Highest education level: Bachelor's degree (e.g., BA, AB, BS)  Occupational History   Occupation: retired  Tobacco Use   Smoking status: Former    Current packs/day: 0.00    Average packs/day: 0.5 packs/day for 40.0 years (20.0 ttl pk-yrs)    Types: Cigarettes    Start date: 04/15/1981    Quit date: 04/15/2021    Years since quitting: 2.3   Smokeless tobacco: Never   Tobacco comments:    Using nicotine lozenges as needed  Vaping Use   Vaping status: Never Used  Substance and Sexual Activity   Alcohol use: No   Drug use: No   Sexual activity: Not Currently    Comment: lives alone, widowed, retired from Print production planner, education. and real estate, no dietary restrictions  Other Topics Concern   Not on file  Social History Narrative   Not on file   Social Drivers of Health   Financial Resource Strain: Low Risk  (07/19/2023)   Overall Financial Resource Strain (CARDIA)    Difficulty of Paying Living Expenses: Not very hard  Food Insecurity: No Food Insecurity (07/19/2023)   Hunger Vital Sign    Worried About Running Out of Food in the Last Year: Never true    Ran Out of Food in the Last Year: Never true  Transportation Needs: No Transportation Needs (07/26/2023)   PRAPARE - Administrator, Civil Service (Medical): No    Lack of Transportation (Non-Medical): No  Physical Activity: Sufficiently Active (07/19/2023)   Exercise Vital Sign    Days of Exercise per Week: 3 days    Minutes of Exercise per Session: 60 min  Stress: No Stress Concern Present (07/26/2023)   [employer] of Occupational Health - Occupational Stress Questionnaire    Feeling of Stress: Not at all  Recent Concern: Stress - Stress Concern Present (07/19/2023)   [employer] of Occupational Health - Occupational Stress Questionnaire    Feeling of Stress: To some extent  Social  Connections: Socially Isolated (07/26/2023)   Social Connection and Isolation Panel    Frequency of Communication with Friends and Family: More than three times a week    Frequency of Social Gatherings with Friends and Family: Twice a week    Attends Religious Services: Never    Database administrator or Organizations: No    Attends Banker Meetings: Never  Marital Status: Widowed  Intimate Partner Violence: Not At Risk (07/26/2023)   Humiliation, Afraid, Rape, and Kick questionnaire    Fear of Current or Ex-Partner: No    Emotionally Abused: No    Physically Abused: No    Sexually Abused: No    Outpatient Medications Prior to Visit  Medication Sig Dispense Refill   acetaminophen  (TYLENOL ) 325 MG tablet Take 325 mg by mouth daily as needed for pain.     aspirin  EC 81 MG tablet Take 81 mg by mouth daily.     Multiple Vitamins-Minerals (HAIR/SKIN/NAILS/BIOTIN PO) Take 500 mg by mouth daily.     potassium chloride  SA (KLOR-CON  M) 20 MEQ tablet Take 1 tablet (20 mEq total) by mouth daily. 90 tablet 1   VYZULTA 0.024 % SOLN Place 1 drop into both eyes at bedtime.     hydrochlorothiazide  (HYDRODIURIL ) 25 MG tablet Take 25 mg by mouth daily. (Patient taking differently: Take 12.5 mg by mouth daily.)     hydrochlorothiazide  (HYDRODIURIL ) 25 MG tablet Take 12.5 mg by mouth daily.     clindamycin  (CLEOCIN ) 150 MG capsule Take 150 mg by mouth 3 (three) times daily.     Na Sulfate-K Sulfate-Mg Sulfate concentrate (SUPREP BOWEL PREP KIT) 17.5-3.13-1.6 GM/177ML SOLN Take 1 kit (354 mLs total) by mouth as directed. (Patient not taking: Reported on 09/05/2023) 324 mL 0   No facility-administered medications prior to visit.    Allergies  Allergen Reactions   Medrol  [Methylprednisolone ] Swelling and Other (See Comments)    Facial and lip swelling, but no breathing issues   Alphagan [Brimonidine] Other (See Comments)    Eye irritation / eye fatigue   Carvedilol  Diarrhea   Rhopressa  [Netarsudil Dimesylate] Itching   Zioptan [Tafluprost (Pf)] Other (See Comments)    Eye irritation   Amlodipine  Other (See Comments)    Hip,legs,and feet feels achy and stiff   Lisinopril  Rash   Losartan  Anxiety   Penicillins Rash   Spironolactone  Other (See Comments)    Muscle cramps   Sulfonamide Derivatives Rash   Timolol Rash    ROS   See HPI Objective:    Physical Exam General: No acute distress. Awake and conversant.  Eyes: Normal conjunctiva, anicteric. Round symmetric pupils.  Respiratory: CTAB. Respirations are non-labored. No wheezing.  Skin: Warm. No rashes or ulcers.  Psych: Alert and oriented. Cooperative,+anxious CV: RRR. No murmur. No lower extremity edema.  MSK: No clubbing or cyanosis.  Neuro:  CN II-XII grossly normal.    BP (!) 158/102 (BP Location: Right Arm, Patient Position: Sitting, Cuff Size: Normal)   Pulse 85   Temp 98 F (36.7 C) (Oral)   Resp 12   Ht 5' 7 (1.702 m)   Wt 119 lb 6.4 oz (54.2 kg)   SpO2 95%   BMI 18.70 kg/m  Wt Readings from Last 3 Encounters:  09/05/23 119 lb 6.4 oz (54.2 kg)  07/26/23 119 lb (54 kg)  07/23/23 119 lb 3.2 oz (54.1 kg)       Assessment & Plan:   Problem List Items Addressed This Visit     COPD (chronic obstructive pulmonary disease) (HCC) - Primary   Denies recent exacerbation.      Essential hypertension   Poorly controlled.  Initiated valsartan  40 mg daily.  Patient to follow-up in 2 weeks for repeat CMP and blood pressure recheck.         Relevant Medications   valsartan  (DIOVAN ) 40 MG tablet   Other Relevant Orders  Comp Met (CMET) (Completed)   TSH (Completed)   Comp Met (CMET)   Hyperlipidemia   Encourage heart healthy diet such as MIND or DASH diet, increase exercise, avoid trans fats, simple carbohydrates and processed foods, consider a krill or fish or flaxseed oil cap daily.        Relevant Medications   valsartan  (DIOVAN ) 40 MG tablet   Other Relevant Orders   Comp Met  (CMET) (Completed)   Lipid panel (Completed)   Hyponatremia   Sodium 123; patient currently asymptomatic. History of chronic hyponatremia, typically low-mid 130s range. Discontinue HCTZ. Return to clinic for repeat sodium testing.  In 2 weeks RTC for recheck CMP and workup for SIADH. Reviewed red-flag symptoms of hyponatremia and instructed patient to seek emergency care if these occur.      Relevant Orders   Sodium   Sodium, urine, random   Osmolality, urine   Osmolality   Low serum vitamin D    Supplement and monitor      Relevant Orders   Vitamin D  (25 hydroxy) (Completed)   Other iron deficiency anemias   Update CBC. Increase leafy greens, consider increased lean red meat and using cast iron cookware. 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      Other Visit Diagnoses       Hyperglycemia       Relevant Orders   HgB A1c (Completed)      Anxiety/Depression Unexpected death of her 53 year old son in December 2024, compounds anxiety and depression.  Denies SI/HI. She is interested in counseling, list of counseling/ therapist provided in AVS. Encourage patient to involve herself in counseling, meditation practices.  Patient is not open to medications at this time.  Patient to return to clinic if symptoms persist or worsen.  Consider medication for anxiety/depression.  Stage III pelvic organ prolapse/incomplete bladder emptying Seen by uro-GYN July 2025, surgical options were discussed.  Patient chose conservative management at this time.  Patient has been doing home floor PT, and notes improvement.  Consider pelvic floor PT in person if symptoms return..  Diarrhea/loose bowels-followed by GI Upcoming colonoscopy planned for further evaluation.  Last seen by GI June 2025 recommendations for loose stool- Diarrhea is  Trial low FODMAP diet, probiotic, fiber.  Patient reports she has been increasing fiber in her diet and this is significantly decreased her diarrhea/loose stools.   FU 4 months  I have discontinued Suzanne HERO. Patnode Ellen's Na Sulfate-K Sulfate-Mg Sulfate concentrate, clindamycin , hydrochlorothiazide , and hydrochlorothiazide . I am also having her start on valsartan . Additionally, I am having her maintain her aspirin  EC, Multiple Vitamins-Minerals (HAIR/SKIN/NAILS/BIOTIN PO), acetaminophen , Vyzulta, and potassium chloride  SA.  Meds ordered this encounter  Medications   valsartan  (DIOVAN ) 40 MG tablet    Sig: Take 1 tablet (40 mg total) by mouth daily.    Dispense:  90 tablet    Refill:  3    Supervising Provider:   DOMENICA BLACKBIRD A [[id number]]

## 2023-08-31 NOTE — Assessment & Plan Note (Addendum)
 Poorly controlled.  Initiated valsartan  40 mg daily.  Patient to follow-up in 2 weeks for repeat CMP and blood pressure recheck.

## 2023-09-04 NOTE — Telephone Encounter (Signed)
 Reached out to patient about appointment and she stated no appointment has been made so she will contact the office herself to make an appointment.

## 2023-09-05 ENCOUNTER — Ambulatory Visit: Admitting: Student

## 2023-09-05 ENCOUNTER — Encounter: Payer: Self-pay | Admitting: Student

## 2023-09-05 VITALS — BP 158/102 | HR 85 | Temp 98.0°F | Resp 12 | Ht 67.0 in | Wt 119.4 lb

## 2023-09-05 DIAGNOSIS — E871 Hypo-osmolality and hyponatremia: Secondary | ICD-10-CM

## 2023-09-05 DIAGNOSIS — R739 Hyperglycemia, unspecified: Secondary | ICD-10-CM

## 2023-09-05 DIAGNOSIS — I10 Essential (primary) hypertension: Secondary | ICD-10-CM

## 2023-09-05 DIAGNOSIS — E782 Mixed hyperlipidemia: Secondary | ICD-10-CM

## 2023-09-05 DIAGNOSIS — D508 Other iron deficiency anemias: Secondary | ICD-10-CM | POA: Diagnosis not present

## 2023-09-05 DIAGNOSIS — R251 Tremor, unspecified: Secondary | ICD-10-CM | POA: Diagnosis not present

## 2023-09-05 DIAGNOSIS — R7989 Other specified abnormal findings of blood chemistry: Secondary | ICD-10-CM | POA: Insufficient documentation

## 2023-09-05 DIAGNOSIS — J449 Chronic obstructive pulmonary disease, unspecified: Secondary | ICD-10-CM

## 2023-09-05 LAB — COMPREHENSIVE METABOLIC PANEL WITH GFR
ALT: 10 U/L (ref 0–35)
AST: 21 U/L (ref 0–37)
Albumin: 4.6 g/dL (ref 3.5–5.2)
Alkaline Phosphatase: 74 U/L (ref 39–117)
BUN: 15 mg/dL (ref 6–23)
CO2: 28 meq/L (ref 19–32)
Calcium: 9.7 mg/dL (ref 8.4–10.5)
Chloride: 88 meq/L — ABNORMAL LOW (ref 96–112)
Creatinine, Ser: 0.83 mg/dL (ref 0.40–1.20)
GFR: 67.56 mL/min (ref >60.00)
Glucose, Bld: 80 mg/dL (ref 70–99)
Potassium: 3.7 meq/L (ref 3.5–5.1)
Sodium: 123 meq/L — ABNORMAL LOW (ref 135–145)
Total Bilirubin: 0.8 mg/dL (ref 0.2–1.2)
Total Protein: 8 g/dL (ref 6.0–8.3)

## 2023-09-05 LAB — CBC WITH DIFFERENTIAL/PLATELET
Basophils Absolute: 0 K/uL (ref 0.0–0.1)
Basophils Relative: 0.7 % (ref 0.0–3.0)
Eosinophils Absolute: 0 K/uL (ref 0.0–0.7)
Eosinophils Relative: 1 % (ref 0.0–5.0)
HCT: 38.6 % (ref 36.0–46.0)
Hemoglobin: 13.3 g/dL (ref 12.0–15.0)
Lymphocytes Relative: 5.8 % — ABNORMAL LOW (ref 12.0–46.0)
Lymphs Abs: 0.2 K/uL — ABNORMAL LOW (ref 0.7–4.0)
MCHC: 34.4 g/dL (ref 30.0–36.0)
MCV: 90.8 fl (ref 78.0–100.0)
Monocytes Absolute: 0.4 K/uL (ref 0.1–1.0)
Monocytes Relative: 10.6 % (ref 3.0–12.0)
Neutro Abs: 2.9 K/uL (ref 1.4–7.7)
Neutrophils Relative %: 81.9 % — ABNORMAL HIGH (ref 43.0–77.0)
Platelets: 220 K/uL (ref 150.0–400.0)
RBC: 4.25 Mil/uL (ref 3.87–5.11)
RDW: 12.9 % (ref 11.5–15.5)
WBC: 3.5 K/uL — ABNORMAL LOW (ref 4.0–10.5)

## 2023-09-05 LAB — LIPID PANEL
Cholesterol: 201 mg/dL — ABNORMAL HIGH (ref 0–200)
HDL: 67.8 mg/dL (ref >39.00)
LDL Cholesterol: 110 mg/dL — ABNORMAL HIGH (ref 0–99)
NonHDL: 132.88
Total CHOL/HDL Ratio: 3
Triglycerides: 116 mg/dL (ref 0.0–149.0)
VLDL: 23.2 mg/dL (ref 0.0–40.0)

## 2023-09-05 LAB — TSH: TSH: 2.76 u[IU]/mL (ref 0.35–5.50)

## 2023-09-05 LAB — VITAMIN D 25 HYDROXY (VIT D DEFICIENCY, FRACTURES): VITD: 66.54 ng/mL (ref 30.00–100.00)

## 2023-09-05 LAB — HEMOGLOBIN A1C: Hgb A1c MFr Bld: 5.8 % (ref 4.6–6.5)

## 2023-09-05 MED ORDER — VALSARTAN 40 MG PO TABS: 40.0000 mg | ORAL_TABLET | Freq: Every day | ORAL | 3 refills | Status: DC

## 2023-09-05 NOTE — Assessment & Plan Note (Signed)
 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

## 2023-09-05 NOTE — Patient Instructions (Signed)

## 2023-09-05 NOTE — Assessment & Plan Note (Signed)
 Supplement and monitor

## 2023-09-06 ENCOUNTER — Ambulatory Visit: Payer: Self-pay | Admitting: Student

## 2023-09-06 DIAGNOSIS — R768 Other specified abnormal immunological findings in serum: Secondary | ICD-10-CM

## 2023-09-06 NOTE — Addendum Note (Signed)
 Addended by: WHEELER HARLENE CROME on: 09/06/2023 10:49 AM   Modules accepted: Orders

## 2023-09-06 NOTE — Addendum Note (Signed)
 Addended by: WHEELER HARLENE CROME on: 09/06/2023 09:57 AM   Modules accepted: Orders

## 2023-09-06 NOTE — Assessment & Plan Note (Addendum)
 Sodium 123; patient currently asymptomatic. History of chronic hyponatremia, typically low-mid 130s range. Discontinue HCTZ. Return to clinic for repeat sodium testing.  In 2 weeks RTC for recheck CMP and workup for SIADH. Reviewed red-flag symptoms of hyponatremia and instructed patient to seek emergency care if these occur.

## 2023-09-11 NOTE — Addendum Note (Signed)
 Addended by: DORLENE CHIQUITA RAMAN on: 09/11/2023 12:15 PM   Modules accepted: Orders

## 2023-09-11 NOTE — Telephone Encounter (Signed)
 Referral updated to urgent

## 2023-09-19 ENCOUNTER — Encounter: Payer: Self-pay | Admitting: Student

## 2023-09-19 NOTE — Telephone Encounter (Signed)
 Appointment made for tomorrow with Harlene to follow up

## 2023-09-20 ENCOUNTER — Ambulatory Visit (INDEPENDENT_AMBULATORY_CARE_PROVIDER_SITE_OTHER): Admitting: Student

## 2023-09-20 ENCOUNTER — Encounter: Payer: Self-pay | Admitting: Student

## 2023-09-20 ENCOUNTER — Ambulatory Visit

## 2023-09-20 ENCOUNTER — Other Ambulatory Visit

## 2023-09-20 VITALS — BP 148/84 | HR 82 | Temp 98.0°F | Resp 12 | Ht 64.0 in | Wt 122.4 lb

## 2023-09-20 DIAGNOSIS — R251 Tremor, unspecified: Secondary | ICD-10-CM

## 2023-09-20 DIAGNOSIS — I10 Essential (primary) hypertension: Secondary | ICD-10-CM

## 2023-09-20 DIAGNOSIS — M2550 Pain in unspecified joint: Secondary | ICD-10-CM

## 2023-09-20 DIAGNOSIS — E871 Hypo-osmolality and hyponatremia: Secondary | ICD-10-CM | POA: Diagnosis not present

## 2023-09-20 MED ORDER — CLONIDINE HCL 0.1 MG PO TABS: 0.1000 mg | ORAL_TABLET | Freq: Two times a day (BID) | ORAL | 1 refills | Status: DC

## 2023-09-20 NOTE — Progress Notes (Signed)
 Subjective:     Patient ID: Suzanne Turner, female    DOB: 1945/08/07, 78 y.o.   MRN: [id number]  Chief Complaint  Patient presents with   stiffness, joint pain, swollen hands and feet since taking     Stopped medication Monday night     HPI  Suzanne Turner is a 78 year old female presents for follow-up chronic conditions.  Follows with uro-GYN, GI,  HTN  Valsartan  was started at the previous visit; although BP was controlled, the patient was unable to tolerate it due to rash and diffuse joint pain/stiffness. Hydrochlorothiazide  had been discontinued at the last visit because of hyponatremia. The patient continues to report ongoing stiffness and joint pain. While on valsartan , BPs at home were in range-She has brought home log and BPs were SBP- 110-130s/  DBP-mid 70s-80s. Pt unable to tolerate valsartan , she stopped taking medication 2 days ago.   Additional concern: Pt reports polyarticular joint pain involving the wrists, ankles, knees, and hands and toes, with onset over the weekend. Pt reports extreme fatigue and felt her hands were red, warm, and swollen. Patient took acetaminophen  multiple times daily with partial relief but continues to experience joint pain.  Patient denies fever, chills, SOB, CP, palpitations, dyspnea, edema, HA, vision changes, N/V/D, abdominal pain, urinary symptoms, rash, weight changes, and recent illness or hospitalizations.    History of Present Illness            There are no preventive care reminders to display for this patient.   Past Medical History:  Diagnosis Date   Abnormal thyroid  blood test 01/28/2016   Allergy    See attached list   Bronchitis    Cataract    Chronic eczema    COPD (chronic obstructive pulmonary disease) (HCC)    Female bladder prolapse    level 3   Glaucoma 07/28/2014   Left eye Dr Camie Albany at [facility]   H/O measles    H/O mumps    High cholesterol    no medications   History  of chicken pox    Hypercalcemia 08/02/2014   Hypertension    Kidney stone    Nummular dermatitis    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   Left eye Dr Camie Albany at [facility]    Osteoarthritis    Osteopenia    Preventative health care 01/30/2016   Salivary duct stone    Skin cancer    Staph infection    after pessary insertion   Urinary incontinence    Vitamin D  deficiency    Wears glasses     Past Surgical History:  Procedure Laterality Date   CATARACT EXTRACTION Bilateral    May 31, 2021 and March 2023   EYE SURGERY  Cataracts 2023   ORIF TIBIA & FIBULA FRACTURES Right 1966   REFRACTIVE SURGERY Left March/April 2018   SALIVARY STONE REMOVAL Right 05/15/2012   Procedure: EXCISION OF RIGHT SUBMANDIBULAR GLAND ;  Surgeon: Ida Loader, MD;  Location: [facility] OR;  Service: ENT;  Laterality: Right;   SALIVARY STONE REMOVAL N/A 11/18/2012   Procedure: INTRAORAL EXCISION SUBMANDIBULAR STONE RIGHT ;  Surgeon: Ida Loader, MD;  Location: [facility];  Service: ENT;  Laterality: N/A;   SKIN BIOPSY Right 12/2012   legs   TONSILLECTOMY  1949   TUBAL LIGATION  1977    Family History  Problem Relation Age of Onset   Arrhythmia Mother    Colon cancer Mother 68   Tremor Mother  Heart disease Mother    Heart disease Father 26   Arthritis Father        broken back with chronic pain   Glaucoma Father    Breast cancer Sister 57   Glaucoma Sister    GER disease Sister    Colitis Sister    Depression Brother    Heart disease Brother    Arrhythmia Brother    Tremor Brother    Hypertension Daughter    Other Son        unclear, maybe heart issue   Colon polyps Maternal Aunt    Arrhythmia Maternal Uncle    Heart disease Maternal Uncle    Arrhythmia Maternal Grandmother    Glaucoma Maternal Grandmother    Bladder Cancer Neg Hx    Uterine cancer Neg Hx    Esophageal cancer Neg Hx     Social History   Socioeconomic History   Marital status: Widowed     Spouse name: Not on file   Number of children: 2   Years of education: Not on file   Highest education level: Bachelor's degree (e.g., BA, AB, BS)  Occupational History   Occupation: retired  Tobacco Use   Smoking status: Former    Current packs/day: 0.00    Average packs/day: 0.5 packs/day for 40.0 years (20.0 ttl pk-yrs)    Types: Cigarettes    Start date: 04/15/1981    Quit date: 04/15/2021    Years since quitting: 2.4   Smokeless tobacco: Never   Tobacco comments:    Using nicotine lozenges as needed  Vaping Use   Vaping status: Never Used  Substance and Sexual Activity   Alcohol use: No   Drug use: No   Sexual activity: Not Currently    Comment: lives alone, widowed, retired from [employer] production pl[employer], education. and real estate, no dietary restrictions  Other Topics Concern   Not on file  Social History Narrative   Not on file   Social Drivers of Health   Financial Resource Strain: Low Risk  (07/19/2023)   Overall Financial Resource Strain (CARDIA)    Difficulty of Paying Living Expenses: Not very hard  Food Insecurity: No Food Insecurity (07/19/2023)   Hunger Vital Sign    Worried About Running Out of Food in the Last Year: Never true    Ran Out of Food in the Last Year: Never true  Transportation Needs: No Transportation Needs (07/26/2023)   PRAPARE - Administrator, Civil Service (Medical): No    Lack of Transportation (Non-Medical): No  Physical Activity: Sufficiently Active (07/19/2023)   Exercise Vital Sign    Days of Exercise per Week: 3 days    Minutes of Exercise per Session: 60 min  Stress: No Stress Concern Present (07/26/2023)   [employer] of Occupational Health - Occupational Stress Questionnaire    Feeling of Stress: Not at all  Recent Concern: Stress - Stress Concern Present (07/19/2023)   [employer] of Occupational Health - Occupational Stress Questionnaire    Feeling of Stress: To some extent  Social Connections: Socially Isolated  (07/26/2023)   Social Connection and Isolation Panel    Frequency of Communication with Friends and Family: More than three times a week    Frequency of Social Gatherings with Friends and Family: Twice a week    Attends Religious Services: Never    Database administrator or Organizations: No    Attends Banker Meetings: Never    Marital Status: Widowed  Intimate Partner Violence: Not At Risk (07/26/2023)   Humiliation, Afraid, Rape, and Kick questionnaire    Fear of Current or Ex-Partner: No    Emotionally Abused: No    Physically Abused: No    Sexually Abused: No    Outpatient Medications Prior to Visit  Medication Sig Dispense Refill   acetaminophen  (TYLENOL ) 325 MG tablet Take 325 mg by mouth daily as needed for pain.     aspirin  EC 81 MG tablet Take 81 mg by mouth daily.     Multiple Vitamins-Minerals (HAIR/SKIN/NAILS/BIOTIN PO) Take 500 mg by mouth daily.     VYZULTA 0.024 % SOLN Place 1 drop into both eyes at bedtime.     potassium chloride  SA (KLOR-CON  M) 20 MEQ tablet Take 1 tablet (20 mEq total) by mouth daily. (Patient not taking: Reported on 09/20/2023) 90 tablet 1   valsartan  (DIOVAN ) 40 MG tablet Take 1 tablet (40 mg total) by mouth daily. 90 tablet 3   No facility-administered medications prior to visit.    Allergies  Allergen Reactions   Medrol  [Methylprednisolone ] Swelling and Other (See Comments)    Facial and lip swelling, but no breathing issues   Alphagan [Brimonidine] Other (See Comments)    Eye irritation / eye fatigue   Carvedilol  Diarrhea   Rhopressa [Netarsudil Dimesylate] Itching   Zioptan [Tafluprost (Pf)] Other (See Comments)    Eye irritation   Amlodipine  Other (See Comments)    Hip,legs,and feet feels achy and stiff   Lisinopril  Rash   Losartan  Anxiety   Penicillins Rash   Spironolactone  Other (See Comments)    Muscle cramps   Sulfonamide Derivatives Rash   Timolol Rash   Valsartan      Pt reports intolerance, rash    ROS   See  HPI Objective:    Physical Exam General: No acute distress. Awake and conversant.  Eyes: Normal conjunctiva, anicteric. Round symmetric pupils.  Respiratory: CTAB. Respirations are non-labored. No wheezing.  Skin: Warm. No rashes or ulcers.  Psych: Alert and oriented. Cooperative+anxious CV: RRR. No murmur. No lower extremity edema.  MSK: No clubbing or cyanosis.  +b/l toes blue tint, capillary refill >2 seconds.  DP +1 b/l feet;  Neuro:  CN II-XII grossly normal. +resting tremor  BP (!) 148/84 (BP Location: Left Arm)   Pulse 82   Temp 98 F (36.7 C) (Oral)   Resp 12   Ht 5' 4 (1.626 m)   Wt 122 lb 6.4 oz (55.5 kg)   SpO2 97%   BMI 21.01 kg/m  Wt Readings from Last 3 Encounters:  09/20/23 122 lb 6.4 oz (55.5 kg)  09/05/23 119 lb 6.4 oz (54.2 kg)  07/26/23 119 lb (54 kg)       Assessment & Plan:   Problem List Items Addressed This Visit     Essential hypertension   Relevant Medications   cloNIDine  (CATAPRES ) 0.1 MG tablet   Hyponatremia   Relevant Orders   Basic Metabolic Panel (BMET)   Osmolality, urine   Sodium, urine, random   Tremor   Other Visit Diagnoses       Hypertension, unspecified type    -  Primary   Relevant Medications   cloNIDine  (CATAPRES ) 0.1 MG tablet     Pain in joint, multiple sites       Relevant Orders   Rheumatoid factor (Completed)   ANA   Sedimentation rate (Completed)   CK (Completed)      Hypertension Patient has trialed multiple antihypertensives with significant  intolerance/adverse effects (lisinopril , spironolactone , timolol, valsartan , losartan , carvedilol , amlodipine ). Given breadth of reported symptoms, concerns for psychosomatic component. Hydrochlorothiazide  has been the only partially tolerated agent but is limited by chronic hyponatremia and was discontinued last OV.  Valsartan  d/c  due to reported Ses.  Plan:  initiate Clonidine  0.1 mg BID and follow up in 4 weeks. Start with 0.5 tablet (0.05 mg) twice daily. If blood  pressure remains >140/90, may increase to 1 full tablet (0.1 mg) twice daily. Medication and common side effects reviewed with the patient; patient voiced understanding and had no further questions at this time. Continue home BP/HR. Consider referral to HTN clinic.  Joint Pain Patient reports persistent joint pain Check ANA, RF factor, ESR, and CRP to evaluate for autoimmune/inflammatory etiologies.  Hyponatremia HCTZ discontinued at last OV. Hyponatremia likely r/t hydrochlorothiazide . Will recheck serum sodium today. Check urine sodium and osmolality r/o SIADH.  Anxiety/Depression Unexpected death of her 93 year old son in December 2024, compounds anxiety and depression.  Denies SI/HI. She is interested in counseling, and previously provided  list of counseling/ therapist provided in AVS. Encourage patient to involve herself in counseling, meditation practices.  Patient is not open to medications or BH referral at this time.  Patient to return to clinic if symptoms persist or worsen.    Tremor Hx resting tremor. Continue to monitor. Consider NeuroPsych referral.   FU 4 months  I have discontinued Suzanne HERO. Suzanne Turner valsartan . I have also changed her cloNIDine . Additionally, I am having her maintain her aspirin  EC, Multiple Vitamins-Minerals (HAIR/SKIN/NAILS/BIOTIN PO), acetaminophen , Vyzulta, and potassium chloride  SA.  Meds ordered this encounter  Medications   DISCONTD: cloNIDine  (CATAPRES ) 0.1 MG tablet    Sig: Take 1 tablet (0.1 mg total) by mouth 2 (two) times daily.    Dispense:  60 tablet    Refill:  1    Supervising Provider:   DOMENICA BLACKBIRD A [[id number]]   cloNIDine  (CATAPRES ) 0.1 MG tablet    Sig: Take 1 tablet (0.1 mg total) by mouth 2 (two) times daily. Note: Start with 0.5 tablet (0.05 mg) twice daily. If blood pressure remains above 140/90, may increase to 1 full tablet (0.1 mg) twice daily.    Dispense:  60 tablet    Refill:  1    Supervising Provider:   DOMENICA BLACKBIRD A [[id number]]

## 2023-09-21 ENCOUNTER — Other Ambulatory Visit

## 2023-09-21 ENCOUNTER — Encounter: Payer: Self-pay | Admitting: Student

## 2023-09-21 DIAGNOSIS — E871 Hypo-osmolality and hyponatremia: Secondary | ICD-10-CM | POA: Diagnosis not present

## 2023-09-21 LAB — COMPREHENSIVE METABOLIC PANEL WITH GFR
ALT: 8 U/L (ref 0–35)
AST: 18 U/L (ref 0–37)
Albumin: 4.2 g/dL (ref 3.5–5.2)
Alkaline Phosphatase: 70 U/L (ref 39–117)
BUN: 13 mg/dL (ref 6–23)
CO2: 24 meq/L (ref 19–32)
Calcium: 9.2 mg/dL (ref 8.4–10.5)
Chloride: 98 meq/L (ref 96–112)
Creatinine, Ser: 0.73 mg/dL (ref 0.40–1.20)
GFR: 78.79 mL/min (ref >60.00)
Glucose, Bld: 81 mg/dL (ref 70–99)
Potassium: 3.7 meq/L (ref 3.5–5.1)
Sodium: 132 meq/L — ABNORMAL LOW (ref 135–145)
Total Bilirubin: 0.5 mg/dL (ref 0.2–1.2)
Total Protein: 7.3 g/dL (ref 6.0–8.3)

## 2023-09-21 LAB — CK: Total CK: 48 U/L (ref 17–177)

## 2023-09-21 LAB — SEDIMENTATION RATE: Sed Rate: 4 mm/h (ref 0–30)

## 2023-09-21 MED ORDER — CLONIDINE HCL 0.1 MG PO TABS: 0.1000 mg | ORAL_TABLET | Freq: Two times a day (BID) | ORAL | 1 refills | Status: DC

## 2023-09-21 NOTE — Addendum Note (Signed)
 Addended by: WHEELER HARLENE CROME on: 09/21/2023 12:10 PM   Modules accepted: Orders

## 2023-09-22 LAB — SODIUM, URINE, RANDOM: Sodium, Ur: 34 mmol/L (ref 28–272)

## 2023-09-22 LAB — OSMOLALITY, URINE: Osmolality, Ur: 209 mosm/kg (ref 50–1200)

## 2023-09-23 DIAGNOSIS — R768 Other specified abnormal immunological findings in serum: Secondary | ICD-10-CM | POA: Insufficient documentation

## 2023-09-23 LAB — ANTI-NUCLEAR AB-TITER (ANA TITER): ANA Titer 1: 1:1280 {titer} — ABNORMAL HIGH

## 2023-09-23 LAB — OSMOLALITY: Osmolality: 272 mosm/kg — ABNORMAL LOW (ref 278–305)

## 2023-09-23 LAB — ANA: Anti Nuclear Antibody (ANA): POSITIVE — AB

## 2023-09-23 LAB — RHEUMATOID FACTOR: Rheumatoid fact SerPl-aCnc: 66 [IU]/mL — ABNORMAL HIGH (ref <14)

## 2023-10-04 ENCOUNTER — Encounter (HOSPITAL_BASED_OUTPATIENT_CLINIC_OR_DEPARTMENT_OTHER): Payer: Self-pay | Admitting: Cardiovascular Disease

## 2023-10-04 ENCOUNTER — Ambulatory Visit (HOSPITAL_BASED_OUTPATIENT_CLINIC_OR_DEPARTMENT_OTHER): Admitting: Cardiovascular Disease

## 2023-10-04 VITALS — BP 168/90 | HR 82 | Ht 67.0 in | Wt 121.8 lb

## 2023-10-04 DIAGNOSIS — I10 Essential (primary) hypertension: Secondary | ICD-10-CM

## 2023-10-04 DIAGNOSIS — E782 Mixed hyperlipidemia: Secondary | ICD-10-CM

## 2023-10-04 NOTE — Progress Notes (Signed)
 [facility] Initial Assessment:    Date:  10/04/2023   ID:  Suzanne Turner, DOB 14-Jul-1945, MRN [id number]  PCP:  Domenica Harlene LABOR, MD  Cardiologist:  None  Nephrologist:  Referring MD: Douglass Kenney NOVAK, FNP   CC: Hypertension  History of Present Illness:    Suzanne Turner is a 78 y.o. female with a hx of hypertension, hyperlipidemia, tobacco abuse, and palpitations here to establish care in the [facility].  She has been working with her primary care provider, Harlene Jolly, NP on blood pressure control.  Blood pressures remain controlled but she has struggled with intolerance.  HCTZ and valsartan  both had to be stopped.  At her visit 09/20/2023 home blood pressures were in the 110s to 130s over 70s to 80s.  She was started on clonidine  0.5 mg twice daily.  Suzanne Turner saw Dr. Claudene in 2016-10-15 for palpitations.  She uses metoprolol .  Coronary calcium  score in 10-15-2013 revealed a calcium  score of 8.5 which was 49th percentile.  Discussed the use of AI scribe software for clinical note transcription with the patient, who gave verbal consent to proceed.  History of Present Illness Suzanne Turner has a history of hypertension for over ten years, which she believes worsened after caring for her husband who died of heart disease in 16-Oct-2002. She has been on various medications, including valsartan , which caused severe adverse reactions such as swelling, joint pain, and redness of the palms. She is currently taking clonidine , half a tablet twice daily, which she finds more tolerable. Her blood pressure readings at home average in the 130s/80s, according to her records, but she reports that her readings are higher when measured in the office.  She has limited the sodium in her diet since her HTN diagnosis.  She has a history of low sodium levels, which dropped to 123 mmol/L while on hydrochlorothiazide , prompting her to increase her salt intake. She does not enjoy salty foods  but has been trying to incorporate more salt into her diet. Her sodium levels have since improved to the 130s.  Her family history is significant for heart disease; her mother had heart disease and died of colon cancer, her father had a heart attack at 67 but lived to 57, and one of her brothers and her grandmother also had heart disease. Despite this, there is longevity in her family.  She engages in minimal physical activity, citing 'laziness' as a barrier. She participates in yoga once a week and swims occasionally. She acknowledges the need for more exercise but finds it challenging to incorporate into her routine.  She avoids caffeine, consuming only decaffeinated coffee since menopause, and does not use any supplements or herbs due to sensitivity to medications. For pain, she uses acetaminophen .  Previous antihypertensives: Valsartan -rash Hydrochlorothiazide -hyponatremia Losartan  Carvedilol  Amlodipine  Timolol Lisinopril  Spironolactone   Past Medical History:  Diagnosis Date   Abnormal thyroid  blood test 01/28/2016   Allergy    See attached list   Bronchitis    Cataract    Chronic eczema    COPD [facility]chronic obstructive pulmonary disease) [facility])    Female bladder prolapse    level 3   Glaucoma 07/28/2014   Left eye Dr Camie Albany at [facility]   H/O measles    H/O mumps    High cholesterol    no medications   History of chicken pox    Hypercalcemia 08/02/2014   Hypertension    Kidney stone    Nummular dermatitis  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   Left eye Dr Camie Albany at [facility]    Osteoarthritis    Osteopenia    Preventative health care 01/30/2016   Salivary duct stone    Skin cancer    Staph infection    after pessary insertion   Urinary incontinence    Vitamin D  deficiency    Wears glasses     Past Surgical History:  Procedure Laterality Date   CATARACT EXTRACTION Bilateral    May 31, 2021 and March 2023   EYE SURGERY   Cataracts 2023   ORIF TIBIA & FIBULA FRACTURES Right 1966   REFRACTIVE SURGERY Left March/April 2018   SALIVARY STONE REMOVAL Right 05/15/2012   Procedure: EXCISION OF RIGHT SUBMANDIBULAR GLAND ;  Surgeon: Ida Loader, MD;  Location: [facility] OR;  Service: ENT;  Laterality: Right;   SALIVARY STONE REMOVAL N/A 11/18/2012   Procedure: INTRAORAL EXCISION SUBMANDIBULAR STONE RIGHT ;  Surgeon: Ida Loader, MD;  Location: [facility];  Service: ENT;  Laterality: N/A;   SKIN BIOPSY Right 12/2012   legs   TONSILLECTOMY  1949   TUBAL LIGATION  1977    Current Medications: Current Meds  Medication Sig   acetaminophen  (TYLENOL ) 325 MG tablet Take 325 mg by mouth daily as needed for pain.   aspirin  EC 81 MG tablet Take 81 mg by mouth daily.   cloNIDine  (CATAPRES ) 0.1 MG tablet Take 1 tablet (0.1 mg total) by mouth 2 (two) times daily. Note: Start with 0.5 tablet (0.05 mg) twice daily. If blood pressure remains above 140/90, may increase to 1 full tablet (0.1 mg) twice daily.   Multiple Vitamins-Minerals (HAIR/SKIN/NAILS/BIOTIN PO) Take 500 mg by mouth daily.   VYZULTA 0.024 % SOLN Place 1 drop into both eyes at bedtime.     Allergies:   Medrol  [methylprednisolone ], Alphagan [brimonidine], Carvedilol , Rhopressa [netarsudil dimesylate], Zioptan [tafluprost (pf)], Amlodipine , Lisinopril , Losartan , Penicillins, Spironolactone , Sulfonamide derivatives, Timolol, and Valsartan    Social History   Socioeconomic History   Marital status: Widowed    Spouse name: Not on file   Number of children: 2   Years of education: Not on file   Highest education level: Bachelor's degree (e.g., BA, AB, BS)  Occupational History   Occupation: retired  Tobacco Use   Smoking status: Former    Current packs/day: 0.00    Average packs/day: 0.5 packs/day for 40.0 years (20.0 ttl pk-yrs)    Types: Cigarettes    Start date: 04/15/1981    Quit date: 04/15/2021    Years since quitting: 2.4   Smokeless tobacco:  Never   Tobacco comments:    Using nicotine lozenges as needed  Vaping Use   Vaping status: Never Used  Substance and Sexual Activity   Alcohol use: No   Drug use: No   Sexual activity: Not Currently    Comment: lives alone, widowed, retired from Print production planner, education. and real estate, no dietary restrictions  Other Topics Concern   Not on file  Social History Narrative   Not on file   Social Drivers of Health   Financial Resource Strain: Low Risk  (07/19/2023)   Overall Financial Resource Strain (CARDIA)    Difficulty of Paying Living Expenses: Not very hard  Food Insecurity: No Food Insecurity (07/19/2023)   Hunger Vital Sign    Worried About Running Out of Food in the Last Year: Never true    Ran Out of Food in the Last Year: Never true  Transportation Needs: No  Transportation Needs (07/26/2023)   PRAPARE - Administrator, Civil Service (Medical): No    Lack of Transportation (Non-Medical): No  Physical Activity: Sufficiently Active (07/19/2023)   Exercise Vital Sign    Days of Exercise per Week: 3 days    Minutes of Exercise per Session: 60 min  Stress: No Stress Concern Present (10/04/2023)   [employer] of Occupational Health - Occupational Stress Questionnaire    Feeling of Stress: Not at all  Recent Concern: Stress - Stress Concern Present (07/19/2023)   [employer] of Occupational Health - Occupational Stress Questionnaire    Feeling of Stress: To some extent  Social Connections: Socially Isolated (10/04/2023)   Social Connection and Isolation Panel    Frequency of Communication with Friends and Family: More than three times a week    Frequency of Social Gatherings with Friends and Family: Twice a week    Attends Religious Services: Never    Database administrator or Organizations: No    Attends Banker Meetings: Never    Marital Status: Widowed     Family History: The patient's family history includes Arrhythmia in her  brother, maternal grandmother, maternal uncle, and mother; Arthritis in her father; Breast cancer (age of onset: 3) in her sister; Colitis in her sister; Colon cancer (age of onset: 7) in her mother; Colon polyps in her maternal aunt; Depression in her brother; GER disease in her sister; Glaucoma in her father, maternal grandmother, and sister; Heart attack (age of onset: 24) in her father; Heart disease in her brother, maternal uncle, and mother; Heart disease (age of onset: 27) in her father; Hypertension in her daughter; Other in her son; Tremor in her brother and mother. There is no history of Bladder Cancer, Uterine cancer, or Esophageal cancer.  ROS:   Please see the history of present illness.    All other systems reviewed and are negative.  EKGs/Labs/Other Studies Reviewed:    EKG:  EKG is ordered today.    EKG Interpretation Date/Time:  Thursday October 04 2023 09:06:44 EDT Ventricular Rate:  85 PR Interval:  170 QRS Duration:  76 QT Interval:  336 QTC Calculation: 399 R Axis:   78  Text Interpretation: Normal sinus rhythm Right atrial enlargement Nonspecific ST and T wave abnormality When compared with ECG of 17-Apr-2021 17:13, No significant change was found Confirmed by Raford Riggs ([id number]) on 10/04/2023 9:25:46 AM         Coronary calcium  score 04/11/2013: IMPRESSION: Coronary calcium  score of 8.5. This was 49th percentile for age and sex matched control. This suggests intermediate risk for future cardiac events.  Recent Labs: 09/05/2023: Hemoglobin 13.3; Platelets 220.0; TSH 2.76 09/20/2023: ALT 8; BUN 13; Creatinine, Ser 0.73; Potassium 3.7; Sodium 132   Recent Lipid Panel    Component Value Date/Time   CHOL 201 (H) 09/05/2023 1137   TRIG 116.0 09/05/2023 1137   HDL 67.80 09/05/2023 1137   CHOLHDL 3 09/05/2023 1137   VLDL 23.2 09/05/2023 1137   LDLCALC 110 (H) 09/05/2023 1137   LDLCALC 126 (H) 10/20/2019 1006    Physical Exam:   VS:  BP (!) 168/90    Pulse 82   Ht 5' 7 (1.702 m)   Wt 121 lb 12.8 oz (55.2 kg)   SpO2 98%   BMI 19.08 kg/m  , BMI Body mass index is 19.08 kg/m. GENERAL:  Well appearing HEENT: Pupils equal round and reactive, fundi not visualized, oral mucosa unremarkable NECK:  No jugular venous distention, waveform within normal limits, carotid upstroke brisk and symmetric, no bruits, no thyromegaly LUNGS:  Clear to auscultation bilaterally HEART:  RRR.  PMI not displaced or sustained,S1 and S2 within normal limits, no S3, no S4, no clicks, no rubs, no murmurs ABD:  Flat, positive bowel sounds normal in frequency in pitch, no bruits, no rebound, no guarding, no midline pulsatile mass, no hepatomegaly, no splenomegaly EXT:  2 plus pulses throughout, no edema, no cyanosis no clubbing SKIN:  No rashes no nodules NEURO:  Cranial nerves II through XII grossly intact, motor grossly intact throughout PSYCH:  Cognitively intact, oriented to person place and time   ASSESSMENT/PLAN:    Assessment & Plan # Primary hypertension Long-standing hypertension managed with clonidine . Valsartan  caused severe adverse reactions. Home readings average 130s/80s; office readings elevated. Clonidine  well-tolerated. She has been intolerant of many other medications as listed above.  Renal denervation discussed as future option if needed. - Order 24-hour blood pressure monitor to assess control. - Continue clonidine  0.05 mg twice daily. - Provided information on renal denervation for future consideration. - Plan follow-up in a couple of months to review monitoring results.   Screening for Secondary Hypertension:     10/04/2023    9:35 AM  Causes  Drugs/Herbals Screened     - Comments very low Na diet.  Rare caffeine.  No supplements.  No NSAIDS  Renovascular HTN N/A  Sleep Apnea Screened  Thyroid  Disease Screened  Hyperaldosteronism N/A  Pheochromocytoma N/A  Cushing's Syndrome N/A  Hyperparathyroidism N/A  Coarctation of the  Aorta Screened     - Comments BP symmetric  Compliance Screened    Relevant Labs/Studies:    Latest Ref Rng & Units 09/20/2023    2:30 PM 09/05/2023   11:37 AM 08/23/2023    9:53 AM  Basic Labs  Sodium 135 - 145 mEq/L 132  123  133   Potassium 3.5 - 5.1 mEq/L 3.7  3.7  3.4   Creatinine 0.40 - 1.20 mg/dL 9.26  9.16  9.08        Latest Ref Rng & Units 09/05/2023   11:37 AM 04/19/2023    9:12 AM  Thyroid    TSH 0.35 - 5.50 uIU/mL 2.76  3.14      Disposition:    FU with MD/PharmD in 2 months    Medication Adjustments/Labs and Tests Ordered: Current medicines are reviewed at length with the patient today.  Concerns regarding medicines are outlined above.  Orders Placed This Encounter  Procedures   CAR 24HR BLOOD PRESSURE MONITOR   EKG 12-Lead   No orders of the defined types were placed in this encounter.    Signed, Annabella Scarce, MD  10/04/2023 3:16 PM    [facility]

## 2023-10-04 NOTE — Patient Instructions (Signed)
 Medication Instructions:  Your physician recommends that you continue on your current medications as directed. Please refer to the Current Medication list given to you today.   Labwork: NONE  Testing/Procedures: 24 HOUR BLOOD PRESSURE MONITOR  Follow-Up: 2 MONTHS WITH CAITLIN W NP OR DR Eagan   Any Other Special Instructions Will Be Listed Below (If Applicable). REVIEW RENAL DENERVATION INFORMATION GIVEN   If you need a refill on your cardiac medications before your next appointment, please call your pharmacy.

## 2023-10-15 DIAGNOSIS — H401131 Primary open-angle glaucoma, bilateral, mild stage: Secondary | ICD-10-CM | POA: Diagnosis not present

## 2023-10-17 DIAGNOSIS — H34831 Tributary (branch) retinal vein occlusion, right eye, with macular edema: Secondary | ICD-10-CM | POA: Diagnosis not present

## 2023-10-24 ENCOUNTER — Ambulatory Visit: Admitting: Student

## 2023-10-30 DIAGNOSIS — M2550 Pain in unspecified joint: Secondary | ICD-10-CM | POA: Insufficient documentation

## 2023-10-30 NOTE — Progress Notes (Unsigned)
 Subjective:     Patient ID: Suzanne Turner, female    DOB: [birth year], 78 y.o.   MRN: [id number]  No chief complaint on file.   HPI  Suzanne Turner is a 78 year old female presents for follow-up chronic conditions.  Follows with uro-GYN, GI, Ophthalmology,  ***potassium--taking potassoim????? DO NOT TAKE  HTN Clonidine  0.05  BID Follow with HTN clinic- Cardiology Patient has had numerous side effects with antihypertensives.  Currently she is on clonidine  0.05 twice daily. Home BP:  Joint Pain  Patient took acetaminophen  multiple times daily with partial relief but continues to experience joint pain. She hs scheduled with Rheumatology next month  Patient denies fever, chills, SOB, CP, palpitations, dyspnea, edema, HA, vision changes, N/V/D, abdominal pain, urinary symptoms, rash, weight changes, and recent illness or hospitalizations.    History of Present Illness            Health Maintenance Due  Topic Date Due   Influenza Vaccine  08/24/2023   COVID-19 Vaccine (8 - 2025-26 season) 09/24/2023     Past Medical History:  Diagnosis Date   Abnormal thyroid  blood test 01/28/2016   Allergy    See attached list   Bronchitis    Cataract    Chronic eczema    COPD (chronic obstructive pulmonary disease) (HCC)    Female bladder prolapse    level 3   Glaucoma 07/28/2014   Left eye Dr Camie Albany at [facility]   H/O measles    H/O mumps    High cholesterol    no medications   History of chicken pox    Hypercalcemia 08/02/2014   Hypertension    Kidney stone    Nummular dermatitis    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   Left eye Dr Camie Albany at [facility]    Osteoarthritis    Osteopenia    Preventative health care 01/30/2016   Salivary duct stone    Skin cancer    Staph infection    after pessary insertion   Urinary incontinence    Vitamin D  deficiency    Wears glasses     Past Surgical History:  Procedure  Laterality Date   CATARACT EXTRACTION Bilateral    May 31, 2021 and March 2023   EYE SURGERY  Cataracts 2023   ORIF TIBIA & FIBULA FRACTURES Right 1966   REFRACTIVE SURGERY Left March/April 2018   SALIVARY STONE REMOVAL Right 05/15/2012   Procedure: EXCISION OF RIGHT SUBMANDIBULAR GLAND ;  Surgeon: Ida Loader, MD;  Location: [facility] OR;  Service: ENT;  Laterality: Right;   SALIVARY STONE REMOVAL N/A 11/18/2012   Procedure: INTRAORAL EXCISION SUBMANDIBULAR STONE RIGHT ;  Surgeon: Ida Loader, MD;  Location: [facility];  Service: ENT;  Laterality: N/A;   SKIN BIOPSY Right 12/2012   legs   TONSILLECTOMY  1949   TUBAL LIGATION  1977    Family History  Problem Relation Age of Onset   Arrhythmia Mother    Colon cancer Mother 49   Tremor Mother    Heart disease Mother    Heart attack Father 68   Heart disease Father 86   Arthritis Father        broken back with chronic pain   Glaucoma Father    Breast cancer Sister 46   Glaucoma Sister    GER disease Sister    Colitis Sister    Depression Brother    Heart disease Brother    Arrhythmia Brother  Tremor Brother    Arrhythmia Maternal Grandmother    Glaucoma Maternal Grandmother    Hypertension Daughter    Other Son        unclear, maybe heart issue   Colon polyps Maternal Aunt    Arrhythmia Maternal Uncle    Heart disease Maternal Uncle    Bladder Cancer Neg Hx    Uterine cancer Neg Hx    Esophageal cancer Neg Hx     Social History   Socioeconomic History   Marital status: Widowed    Spouse name: Not on file   Number of children: 2   Years of education: Not on file   Highest education level: Bachelor's degree (e.g., BA, AB, BS)  Occupational History   Occupation: retired  Tobacco Use   Smoking status: Former    Current packs/day: 0.00    Average packs/day: 0.5 packs/day for 40.0 years (20.0 ttl pk-yrs)    Types: Cigarettes    Start date: 04/15/1981    Quit date: 04/15/2021    Years since quitting: 2.5    Smokeless tobacco: Never   Tobacco comments:    Using nicotine lozenges as needed  Vaping Use   Vaping status: Never Used  Substance and Sexual Activity   Alcohol use: No   Drug use: No   Sexual activity: Not Currently    Comment: lives alone, widowed, retired from Print production planner, education. and real estate, no dietary restrictions  Other Topics Concern   Not on file  Social History Narrative   Not on file   Social Drivers of Health   Financial Resource Strain: Low Risk  (07/19/2023)   Overall Financial Resource Strain (CARDIA)    Difficulty of Paying Living Expenses: Not very hard  Food Insecurity: No Food Insecurity (07/19/2023)   Hunger Vital Sign    Worried About Running Out of Food in the Last Year: Never true    Ran Out of Food in the Last Year: Never true  Transportation Needs: No Transportation Needs (07/26/2023)   PRAPARE - Administrator, Civil Service (Medical): No    Lack of Transportation (Non-Medical): No  Physical Activity: Sufficiently Active (07/19/2023)   Exercise Vital Sign    Days of Exercise per Week: 3 days    Minutes of Exercise per Session: 60 min  Stress: No Stress Concern Present (10/04/2023)   [employer] of Occupational Health - Occupational Stress Questionnaire    Feeling of Stress: Not at all  Recent Concern: Stress - Stress Concern Present (07/19/2023)   [employer] of Occupational Health - Occupational Stress Questionnaire    Feeling of Stress: To some extent  Social Connections: Socially Isolated (10/04/2023)   Social Connection and Isolation Panel    Frequency of Communication with Friends and Family: More than three times a week    Frequency of Social Gatherings with Friends and Family: Twice a week    Attends Religious Services: Never    Database administrator or Organizations: No    Attends Banker Meetings: Never    Marital Status: Widowed  Intimate Partner Violence: Not At Risk (07/26/2023)    Humiliation, Afraid, Rape, and Kick questionnaire    Fear of Current or Ex-Partner: No    Emotionally Abused: No    Physically Abused: No    Sexually Abused: No    Outpatient Medications Prior to Visit  Medication Sig Dispense Refill   acetaminophen  (TYLENOL ) 325 MG tablet Take 325 mg by mouth daily as needed  for pain.     aspirin  EC 81 MG tablet Take 81 mg by mouth daily.     cloNIDine  (CATAPRES ) 0.1 MG tablet Take 1 tablet (0.1 mg total) by mouth 2 (two) times daily. Note: Start with 0.5 tablet (0.05 mg) twice daily. If blood pressure remains above 140/90, may increase to 1 full tablet (0.1 mg) twice daily. 60 tablet 1   Multiple Vitamins-Minerals (HAIR/SKIN/NAILS/BIOTIN PO) Take 500 mg by mouth daily.     potassium chloride  SA (KLOR-CON  M) 20 MEQ tablet Take 1 tablet (20 mEq total) by mouth daily. (Patient not taking: Reported on 10/04/2023) 90 tablet 1   VYZULTA 0.024 % SOLN Place 1 drop into both eyes at bedtime.     No facility-administered medications prior to visit.    Allergies  Allergen Reactions   Medrol  [Methylprednisolone ] Swelling and Other (See Comments)    Facial and lip swelling, but no breathing issues   Alphagan [Brimonidine] Other (See Comments)    Eye irritation / eye fatigue   Carvedilol  Diarrhea   Rhopressa [Netarsudil Dimesylate] Itching   Zioptan [Tafluprost (Pf)] Other (See Comments)    Eye irritation   Amlodipine  Other (See Comments)    Hip,legs,and feet feels achy and stiff   Lisinopril  Rash   Losartan  Anxiety   Penicillins Rash   Spironolactone  Other (See Comments)    Muscle cramps   Sulfonamide Derivatives Rash   Timolol Rash   Valsartan      Pt reports intolerance, rash    ROS   See HPI Objective:    Physical Exam General: No acute distress. Awake and conversant.  Eyes: Normal conjunctiva, anicteric. Round symmetric pupils.  Respiratory: CTAB. Respirations are non-labored. No wheezing.  Skin: Warm. No rashes or ulcers.  Psych: Alert and  oriented. Cooperative+anxious CV: RRR. No murmur. No lower extremity edema.  MSK: No clubbing or cyanosis.  +b/l toes blue tint, capillary refill >2 seconds.  DP +1 b/l feet;  Neuro:  CN II-XII grossly normal. +resting tremor  There were no vitals taken for this visit. Wt Readings from Last 3 Encounters:  10/04/23 121 lb 12.8 oz (55.2 kg)  09/20/23 122 lb 6.4 oz (55.5 kg)  09/05/23 119 lb 6.4 oz (54.2 kg)       Assessment & Plan:   Problem List Items Addressed This Visit   None   Hypertension Patient has trialed multiple antihypertensives with significant intolerance/adverse effects (lisinopril , spironolactone , timolol, valsartan , losartan , carvedilol , amlodipine ). Given breadth of reported symptoms, concerns for psychosomatic component. Hydrochlorothiazide  has been the only partially tolerated agent but is limited by chronic hyponatremia and was discontinued last OV.  Valsartan  d/c  due to reported Ses.  Plan:  initiate Clonidine  0.1 mg BID and follow up in 4 weeks. Start with 0.5 tablet (0.05 mg) twice daily. If blood pressure remains >140/90, may increase to 1 full tablet (0.1 mg) twice daily. Medication and common side effects reviewed with the patient; patient voiced understanding and had no further questions at this time. Continue home BP/HR. Consider referral to HTN clinic.  Joint Pain Patient reports persistent joint pain Check ANA, RF factor, ESR, and CRP to evaluate for autoimmune/inflammatory etiologies.  Hyponatremia HCTZ discontinued at last OV. Hyponatremia likely r/t hydrochlorothiazide . Will recheck serum sodium today. Check urine sodium and osmolality r/o SIADH.  Anxiety/Depression Unexpected death of her 75 year old son in December 2024, compounds anxiety and depression.  Denies SI/HI. She is interested in counseling, and previously provided  list of counseling/ therapist provided in AVS. Encourage  patient to involve herself in counseling, meditation practices.   Patient is not open to medications or BH referral at this time.  Patient to return to clinic if symptoms persist or worsen.    Tremor Hx resting tremor. Continue to monitor. Consider NeuroPsych referral.   FU 4 months  I am having Suzanne HERO. Levora Czar maintain her aspirin  EC, Multiple Vitamins-Minerals (HAIR/SKIN/NAILS/BIOTIN PO), acetaminophen , Vyzulta, potassium chloride  SA, and cloNIDine .  No orders of the defined types were placed in this encounter.

## 2023-10-31 ENCOUNTER — Ambulatory Visit (INDEPENDENT_AMBULATORY_CARE_PROVIDER_SITE_OTHER): Admitting: Student

## 2023-10-31 ENCOUNTER — Encounter: Payer: Self-pay | Admitting: Student

## 2023-10-31 VITALS — BP 162/90 | HR 76 | Temp 97.8°F | Resp 16 | Ht 67.0 in | Wt 122.6 lb

## 2023-10-31 DIAGNOSIS — I10 Essential (primary) hypertension: Secondary | ICD-10-CM | POA: Diagnosis not present

## 2023-10-31 DIAGNOSIS — E871 Hypo-osmolality and hyponatremia: Secondary | ICD-10-CM | POA: Diagnosis not present

## 2023-10-31 DIAGNOSIS — R251 Tremor, unspecified: Secondary | ICD-10-CM

## 2023-10-31 DIAGNOSIS — M2550 Pain in unspecified joint: Secondary | ICD-10-CM

## 2023-10-31 DIAGNOSIS — E782 Mixed hyperlipidemia: Secondary | ICD-10-CM

## 2023-10-31 MED ORDER — CLONIDINE HCL 0.1 MG PO TABS: 0.1000 mg | ORAL_TABLET | Freq: Three times a day (TID) | ORAL | 1 refills | Status: AC

## 2023-10-31 NOTE — Assessment & Plan Note (Signed)
 Impoved since stopping valsartan  per patient. Valsartan  added to allergy list.

## 2023-10-31 NOTE — Assessment & Plan Note (Addendum)
 Encourage heart healthy diet such as MIND or DASH diet, increase exercise, avoid trans fats, simple carbohydrates and processed foods, consider a krill or fish or flaxseed oil cap daily.    Discussed ASCVD risk score and  statin medications, Pt denies at this time.  The 10-year ASCVD risk score (Arnett DK, et al., 2019) is: 42.3%   Values used to calculate the score:     Age: 78 years     Clincally relevant sex: Female     Is Non-Hispanic African American: No     Diabetic: No     Tobacco smoker: No     Systolic Blood Pressure: 162 mmHg     Is BP treated: Yes     HDL Cholesterol: 67.8 mg/dL     Total Cholesterol: 201 mg/dL

## 2023-10-31 NOTE — Assessment & Plan Note (Addendum)
 Resting tremor b/l hands, chronic. Monitor.

## 2023-10-31 NOTE — Assessment & Plan Note (Signed)
 Workip for SIADH negative, hyponatremia likely r/t hydrochlorothiazide . Update CMP today.

## 2023-10-31 NOTE — Assessment & Plan Note (Signed)
 Hypertension managed with clonidine  0.1 mg BID; take half tab (0.05 mg BID).  Home readings generally good, with some over >140/90.  White coat hypertension noted. Pt did go to hypertension clinic, no adjustments made.  - Add third dose of clonidine  0.05 mg TID - Monitor BP at home, bring readings to next OV

## 2023-11-01 ENCOUNTER — Ambulatory Visit: Payer: Self-pay | Admitting: Student

## 2023-11-01 LAB — COMPREHENSIVE METABOLIC PANEL WITH GFR
ALT: 9 U/L (ref 0–35)
AST: 20 U/L (ref 0–37)
Albumin: 4.4 g/dL (ref 3.5–5.2)
Alkaline Phosphatase: 59 U/L (ref 39–117)
BUN: 14 mg/dL (ref 6–23)
CO2: 26 meq/L (ref 19–32)
Calcium: 9.1 mg/dL (ref 8.4–10.5)
Chloride: 100 meq/L (ref 96–112)
Creatinine, Ser: 0.82 mg/dL (ref 0.40–1.20)
GFR: 68.48 mL/min (ref >60.00)
Glucose, Bld: 84 mg/dL (ref 70–99)
Potassium: 3.6 meq/L (ref 3.5–5.1)
Sodium: 134 meq/L — ABNORMAL LOW (ref 135–145)
Total Bilirubin: 0.8 mg/dL (ref 0.2–1.2)
Total Protein: 7.5 g/dL (ref 6.0–8.3)

## 2023-11-20 NOTE — Progress Notes (Signed)
 Office Visit Note  Patient: Suzanne Turner             Date of Birth: 04/21/1945           MRN: [id number]             PCP: Suzanne Harlene LABOR, MD Referring: Suzanne Harlene CROME, NP Visit Date: 11/22/2023 Occupation: @GUAROCC @  Subjective:  New Patient (Initial Visit) (Abnormal Labs)  Discussed the use of AI scribe software for clinical note transcription with the patient, who gave verbal consent to proceed.  History of Present Illness Suzanne Turner is a 78 year old female who presents with abnormal test results. She was referred by Suzanne Turner for evaluation of abnormal test results.  She has experienced dry eyes and dry mouth for at least forty years, with an inability to produce tears. She uses Refresh eye drops a couple of times daily, especially in hot and dusty conditions, and another over-the-counter product for irritation.  She experienced joint stiffness and swelling after starting valsartan  for hypertension, which resolved within three days of discontinuing the medication. She describes herself as normally flexible and found the stiffness disconcerting.  She has had patchy hair loss over the past fifteen years, attributing it to medication use. Her sister experiences similar hair loss. No new rashes, sores in the mouth, or foamy urine are present.  She reports that she tends to contract the flu and COVID easily.  Her fingertips and toes turn red in cold weather, a condition she has observed for as long as she can remember. No changes in urine or sun-induced rashes are reported.     Activities of Daily Living:  Patient reports morning stiffness for 0 minutes.   Patient Denies nocturnal pain.  Difficulty dressing/grooming: Denies Difficulty climbing stairs: Denies Difficulty getting out of chair: Denies Difficulty using hands for taps, buttons, cutlery, and/or writing: Denies  Review of Systems  Constitutional:  Negative for fatigue.  HENT:  Positive for  mouth dryness. Negative for mouth sores.   Eyes:  Positive for dryness.  Respiratory:  Negative for shortness of breath.   Cardiovascular:  Negative for chest pain and palpitations.  Gastrointestinal:  Negative for blood in stool, constipation and diarrhea.  Endocrine: Negative for increased urination.  Genitourinary:  Negative for involuntary urination.  Musculoskeletal:  Positive for gait problem. Negative for joint pain, joint pain, joint swelling, myalgias, muscle weakness, morning stiffness, muscle tenderness and myalgias.  Skin:  Positive for color change. Negative for rash, hair loss and sensitivity to sunlight.  Allergic/Immunologic: Negative for susceptible to infections.  Neurological:  Negative for dizziness and headaches.  Hematological:  Negative for swollen glands.  Psychiatric/Behavioral:  Negative for depressed mood and sleep disturbance. The patient is nervous/anxious.     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   COPD (chronic obstructive pulmonary disease) (HCC) 08/02/2021   Constipation 08/02/2021   Preventative health care 11/10/2020   History of UTI 11/10/2020   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) 04/20/2020   Arthritis 10/20/2019   SCC (squamous cell carcinoma), leg, left 09/03/2017   Hyponatremia 03/05/2017   Counseling and coordination of care 01/30/2016   Abnormal thyroid  blood test 01/28/2016   Other iron deficiency anemias 08/02/2014   Hypercalcemia  08/02/2014   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   History of chicken pox    Rash and nonspecific skin eruption 07/23/2013   At risk for coronary artery disease 03/05/2013   Colon polyps 04/17/2011   Tremor 04/17/2011   Hyperlipidemia 03/29/2011   Palpitations 02/07/2011   Vitamin D  deficiency 05/07/2008   Essential hypertension 05/07/2008   Osteoporosis 05/07/2008    Past Medical History:  Diagnosis Date   Abnormal thyroid  blood test 01/28/2016   Allergy    See attached list   Bronchitis    Cataract    Chronic eczema    COPD (chronic obstructive pulmonary disease) (HCC)    Female bladder prolapse    level 3   Glaucoma 07/28/2014   Left eye Dr Camie Albany at [facility]   H/O measles    H/O mumps    High cholesterol    no medications   History of chicken pox    Hypercalcemia 08/02/2014   Hypertension    Kidney stone    Nummular dermatitis    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   Left eye Dr Camie Albany at [facility]    Osteoarthritis    Osteopenia    Preventative health care 01/30/2016   Salivary duct stone    Skin cancer    Staph infection    after pessary insertion   Urinary incontinence    Vitamin D  deficiency    Wears glasses     Family History  Problem Relation Age of Onset   Arrhythmia Mother    Colon cancer Mother 27   Tremor Mother    Heart disease Mother    Heart attack Father 71   Heart disease Father 46   Arthritis Father        broken back with chronic pain   Glaucoma Father    Breast cancer Sister 71   Glaucoma Sister    GER disease Sister    Colitis Sister    Depression Brother    Heart disease Brother    Arrhythmia Brother    Tremor Brother    Arrhythmia Maternal Grandmother    Glaucoma Maternal Grandmother    Hypertension Daughter    Kidney Stones Daughter    Heart attack Son    Colon polyps Maternal Aunt    Arrhythmia Maternal Uncle    Heart disease Maternal Uncle    Bladder Cancer Neg Hx    Uterine  cancer Neg Hx    Esophageal cancer Neg Hx    Past Surgical History:  Procedure Laterality Date   CATARACT EXTRACTION Bilateral    May 31, 2021 and March 2023   EYE SURGERY  Cataracts 2023   ORIF TIBIA & FIBULA FRACTURES Right 1966   REFRACTIVE SURGERY Left March/April 2018   SALIVARY STONE REMOVAL Right 05/15/2012   Procedure: EXCISION OF RIGHT SUBMANDIBULAR GLAND ;  Surgeon: Ida Loader, MD;  Location: [facility] OR;  Service: ENT;  Laterality: Right;   SALIVARY STONE REMOVAL N/A 11/18/2012   Procedure: INTRAORAL EXCISION SUBMANDIBULAR STONE RIGHT ;  Surgeon: Ida Loader, MD;  Location: [facility];  Service: ENT;  Laterality: N/A;   SKIN BIOPSY Right 12/2012   legs   TONSILLECTOMY  1949   TUBAL LIGATION  1977  Social History   Social History Narrative   Not on file   Immunization History  Administered Date(s) Administered    sv, Bivalent, Protein Subunit Rsvpref,pf (Abrysvo) 11/11/2021   Fluad Quad(high Dose 65+) 10/20/2019, 10/10/2021   INFLUENZA, HIGH DOSE SEASONAL PF 11/13/2016, 11/13/2017, 10/10/2021, 10/04/2023   Influenza Split 10/23/2010, 10/17/2022   Influenza,inj,Quad PF,6+ Mos 12/11/2012   Influenza-Unspecified 11/05/2013, 11/16/2014, 10/08/2020   Moderna Covid-19 Fall Seasonal Vaccine 66yrs & older 07/26/2022   Moderna Covid-19 Vaccine Bivalent Booster 5yrs & up 10/08/2020   Moderna SARS-COV2 Booster Vaccination 10/08/2020, 07/26/2022   PFIZER(Purple Top)SARS-COV-2 Vaccination 03/01/2019, 03/26/2019, 11/22/2019, 05/21/2020   PNEUMOCOCCAL CONJUGATE-20 03/02/2023   Pfizer Covid-19 Vaccine Bivalent Booster 74yrs & up 10/25/2021, 11/16/2022   Pfizer(Comirnaty)Fall Seasonal Vaccine 12 years and older 11/19/2022   Pneumococcal Conjugate-13 06/23/2010, 05/11/2014   Respiratory Syncytial Virus Vaccine,Recomb Aduvanted(Arexvy) 11/11/2021   Tdap 04/01/2014   Zoster Recombinant(Shingrix) 10/12/2017, 12/14/2017   Zoster, Live 07/24/2006     Objective: Vital  Signs: BP (!) 152/89 (BP Location: Right Arm, Patient Position: Sitting, Cuff Size: Small)   Pulse 67   Temp (!) 97.5 F (36.4 C)   Resp 13   Ht 5' 6 (1.676 m)   Wt 120 lb 12.8 oz (54.8 kg)   BMI 19.50 kg/m    Physical Exam Vitals and nursing note reviewed.  HENT:     Head: Normocephalic and atraumatic.     Nose: Nose normal.  Eyes:     Conjunctiva/sclera: Conjunctivae normal.     Pupils: Pupils are equal, round, and reactive to light.  Cardiovascular:     Rate and Rhythm: Normal rate and regular rhythm.     Heart sounds: Normal heart sounds.  Pulmonary:     Effort: Pulmonary effort is normal.     Breath sounds: Normal breath sounds.  Skin:    General: Skin is warm and dry.  Neurological:     Mental Status: She is alert. Mental status is at baseline.  Psychiatric:        Mood and Affect: Mood normal.        Behavior: Behavior normal.      Musculoskeletal Exam:   CDAI Exam: CDAI Score: -- Patient Global: --; Provider Global: -- Swollen: 0 ; Tender: 1  Joint Exam 11/22/2023      Right  Left  [facility]      Tender     Investigation: No additional findings.  Imaging: No results found.  Recent Labs: Lab Results  Component Value Date   WBC 3.5 (L) 09/05/2023   HGB 13.3 09/05/2023   PLT 220.0 09/05/2023   NA 134 (L) 10/31/2023   K 3.6 10/31/2023   CL 100 10/31/2023   CO2 26 10/31/2023   GLUCOSE 84 10/31/2023   BUN 14 10/31/2023   CREATININE 0.82 10/31/2023   BILITOT 0.8 10/31/2023   ALKPHOS 59 10/31/2023   AST 20 10/31/2023   ALT 9 10/31/2023   PROT 7.5 10/31/2023   ALBUMIN 4.4 10/31/2023   CALCIUM  9.1 10/31/2023   GFRAA 85 11/04/2019   Lab Results  Component Value Date   ANA POSITIVE (A) 09/20/2023   RF 66 (H) 09/20/2023    Speciality Comments: No specialty comments available.  Procedures:  No procedures performed Allergies: Medrol  [methylprednisolone ], Alphagan [brimonidine], Carvedilol , Rhopressa [netarsudil dimesylate], Zioptan [tafluprost  (pf)], Amlodipine , Lisinopril , Losartan , Penicillins, Spironolactone , Sulfonamide derivatives, Timolol, and Valsartan    Assessment / Plan:     Visit Diagnoses:  Positive ANA (antinuclear antibody)  Leukopenia, unspecified type  Sicca symptoms Alopecia Patient with high titer positive ANA (1:1280) w/ hx of unexplained leukopenia, dry eyes and dry mouth, and alopecia. Given these symptoms and high titer ANA, will obtain Sjogren's syndrome antibods(ssa + ssb), Protein / creatinine ratio, urine, Anti-DNA antibody, double-stranded, Anti-Smith antibody, C3 and C4.   Discussed with patient that a positive ANA need not represent presence of clinically active systemic autoimmune disease.  Can be positive in the normal population, autoimmune thyroid  disease (Graves' disease, Hashimoto's thyroiditis etc.), or infection. ANA can be positive in the healthy population at the following rates: ANA 1:40: 20%-30%, ANA 1:80: 10%-15%, ANA 1:160: 5%, ANA 1:320: 3% positive, healthy relative of an SLE patient: 5%-25% positive (usually low titers), and elderly (age >70 years): up to 70% positive at ANA titer 1:40. However, given such elevated titer level, this is more suggestive of underlying autoimmune disease. Further recommendations pending results, however patient very hesitant to initiate any new medications given rxns to most medications.    Rheumatoid factor positive Patient with elevated rheumatoid factor (66). However, patient denies any s/s of inflammatory joint pain and has no synovitis on examination. As discussed with patient, rheumatoid factor is a nonspecific finding and can be seen in a number of rheumatologic and non-rheumatologic conditions including rheumatoid arthritis, sjogren's, mixed connective tissue disease, cryoglobulinemia, SLE and myositis. Indolent infections, endocarditis, hepatitis B and C are also associated with elevated RF. It can also be elevated in B-cell neoplasms and PBC. Rheumatoid  factor can also be positive in normal individuals at a low titer (less than 50 IU) w/ a prevalence as high as 14% in apparently healthy people aged 16-95.    Orders: Orders Placed This Encounter  Procedures   Sjogren's syndrome antibods(ssa + ssb)   Protein / creatinine ratio, urine   Anti-DNA antibody, double-stranded   Anti-Smith antibody   C3 and C4   No orders of the defined types were placed in this encounter.   I personally spent a total of 60 minutes in the care of the patient today including preparing to see the patient, getting/reviewing separately obtained history, performing a medically appropriate exam/evaluation, counseling and educating, placing orders, documenting clinical information in the EHR, and independently interpreting results.  Follow-Up Instructions: Return in about 6 months (around 05/22/2024).   Asberry Claw, DO

## 2023-11-22 ENCOUNTER — Ambulatory Visit

## 2023-11-22 VITALS — BP 152/89 | HR 67 | Temp 97.5°F | Resp 13 | Ht 66.0 in | Wt 120.8 lb

## 2023-11-22 DIAGNOSIS — L659 Nonscarring hair loss, unspecified: Secondary | ICD-10-CM

## 2023-11-22 DIAGNOSIS — M3500 Sicca syndrome, unspecified: Secondary | ICD-10-CM | POA: Diagnosis not present

## 2023-11-22 DIAGNOSIS — R7689 Other specified abnormal immunological findings in serum: Secondary | ICD-10-CM

## 2023-11-22 DIAGNOSIS — D72819 Decreased white blood cell count, unspecified: Secondary | ICD-10-CM

## 2023-11-23 ENCOUNTER — Ambulatory Visit: Payer: Self-pay

## 2023-11-23 ENCOUNTER — Other Ambulatory Visit: Payer: Self-pay | Admitting: Student

## 2023-11-23 DIAGNOSIS — I10 Essential (primary) hypertension: Secondary | ICD-10-CM

## 2023-11-23 DIAGNOSIS — R7689 Other specified abnormal immunological findings in serum: Secondary | ICD-10-CM | POA: Diagnosis not present

## 2023-11-23 DIAGNOSIS — D72819 Decreased white blood cell count, unspecified: Secondary | ICD-10-CM | POA: Diagnosis not present

## 2023-11-23 DIAGNOSIS — M3500 Sicca syndrome, unspecified: Secondary | ICD-10-CM | POA: Diagnosis not present

## 2023-11-23 LAB — ANTI-DNA ANTIBODY, DOUBLE-STRANDED: ds DNA Ab: 1 [IU]/mL

## 2023-11-23 LAB — C3 AND C4
C3 Complement: 109 mg/dL (ref 83–193)
C4 Complement: 21 mg/dL (ref 15–57)

## 2023-11-23 LAB — ANTI-SMITH ANTIBODY: ENA SM Ab Ser-aCnc: <1 AI

## 2023-11-23 LAB — SJOGREN'S SYNDROME ANTIBODS(SSA + SSB)
SSA (Ro) (ENA) Antibody, IgG: >8 AI — AB
SSB (La) (ENA) Antibody, IgG: >8 AI — AB

## 2023-11-24 LAB — PROTEIN / CREATININE RATIO, URINE
Creatinine, Urine: 52 mg/dL (ref 20–275)
Protein/Creat Ratio: 346 mg/g{creat} — ABNORMAL HIGH (ref 24–184)
Protein/Creatinine Ratio: 0.346 mg/mg{creat} — ABNORMAL HIGH (ref 0.024–0.184)
Total Protein, Urine: 18 mg/dL (ref 5–24)

## 2023-11-27 ENCOUNTER — Ambulatory Visit

## 2023-11-27 VITALS — BP 160/85 | HR 76 | Temp 97.2°F | Resp 13 | Wt 121.0 lb

## 2023-11-27 DIAGNOSIS — M3500 Sicca syndrome, unspecified: Secondary | ICD-10-CM | POA: Diagnosis not present

## 2023-11-27 DIAGNOSIS — B999 Unspecified infectious disease: Secondary | ICD-10-CM | POA: Diagnosis not present

## 2023-11-27 DIAGNOSIS — R809 Proteinuria, unspecified: Secondary | ICD-10-CM

## 2023-11-27 NOTE — Progress Notes (Unsigned)
 Office Visit Note  Patient: Suzanne Turner             Date of Birth: 12/22/1945           MRN: [id number]             PCP: Domenica Harlene LABOR, MD Referring: Domenica Harlene LABOR, MD Visit Date: 11/27/2023 Occupation: Data Unavailable  Subjective:  Results   Discussed the use of AI scribe software for clinical note transcription with the patient, who gave verbal consent to proceed.  History of Present Illness Suzanne Turner is a 78 year old female with positive ANA who presents for follow-up of test results. She was last seen as a new patient on 11/22/23.  She has a long-standing history of dry mouth and dry eyes, managed with Refresh eye drops and a mouth spray. There has been no significant worsening of these symptoms over the past ten years. Discussed with patient recent test results revealed a positive SSA and SSB, consistent with diagnosis of Sjogren's syndrome. She also has a mildly positive rheumatoid factor, which was part of the initial work-up that led her to seek rheumatology care, and can also be seen in patient's with SS.   She was also found to have proteinuria in her urine on her initial work-up.  No new joint pain except for arthritis in her thumb. She has not experienced gland swelling recently, though she recalls an episode of gland swelling three years ago. No new shortness of breath or liver issues.  She has a history of being sensitive to infections, recalling a severe flu episode in 1991 where she was significantly affected while her family was not. She reports being susceptible to flu-like illnesses.    Activities of Daily Living:  Patient reports morning stiffness for 0 minutes.   Patient Denies nocturnal pain.  Difficulty dressing/grooming: Denies Difficulty climbing stairs: Denies Difficulty getting out of chair: Denies Difficulty using hands for taps, buttons, cutlery, and/or writing: Denies  Review of Systems  Constitutional:  Negative for fatigue.   HENT:  Positive for mouth dryness. Negative for mouth sores.   Eyes:  Positive for dryness.  Respiratory:  Negative for shortness of breath.   Cardiovascular:  Negative for chest pain and palpitations.  Gastrointestinal:  Negative for blood in stool, constipation and diarrhea.  Endocrine: Negative for increased urination.  Genitourinary:  Negative for involuntary urination.  Musculoskeletal:  Positive for gait problem. Negative for joint pain, joint pain, joint swelling, myalgias, muscle weakness, morning stiffness, muscle tenderness and myalgias.  Skin:  Positive for hair loss. Negative for color change, rash and sensitivity to sunlight.  Allergic/Immunologic: Positive for susceptible to infections.  Neurological:  Negative for dizziness and headaches.  Hematological:  Negative for swollen glands.  Psychiatric/Behavioral:  Negative for depressed mood and sleep disturbance. The patient is not nervous/anxious.     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   COPD (chronic obstructive pulmonary disease) (HCC) 08/02/2021   Constipation 08/02/2021  Preventative health care 11/10/2020   History of UTI 11/10/2020   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) 04/20/2020   Arthritis 10/20/2019   SCC (squamous cell carcinoma), leg, left 09/03/2017   Hyponatremia 03/05/2017   Counseling and coordination of care 01/30/2016   Abnormal thyroid  blood test 01/28/2016   Other iron deficiency anemias  08/02/2014   Hypercalcemia 08/02/2014   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   History of chicken pox    Rash and nonspecific skin eruption 07/23/2013   At risk for coronary artery disease 03/05/2013   Colon polyps 04/17/2011   Tremor 04/17/2011   Hyperlipidemia 03/29/2011   Palpitations 02/07/2011   Vitamin D  deficiency 05/07/2008   Essential hypertension 05/07/2008   Osteoporosis 05/07/2008    Past Medical History:  Diagnosis Date   Abnormal thyroid  blood test 01/28/2016   Allergy    See attached list   Bronchitis    Cataract    Chronic eczema    COPD (chronic obstructive pulmonary disease) [facility])    Female bladder prolapse    level 3   Glaucoma 07/28/2014   Left eye Dr Camie Albany at [facility]   H/O measles    H/O mumps    High cholesterol    no medications   History of chicken pox    Hypercalcemia 08/02/2014   Hypertension    Kidney stone    Nummular dermatitis    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   Left eye Dr Camie Albany at [facility]    Osteoarthritis    Osteopenia    Preventative health care 01/30/2016   Salivary duct stone    Skin cancer    Staph infection    after pessary insertion   Urinary incontinence    Vitamin D  deficiency    Wears glasses     Family History  Problem Relation Age of Onset   Arrhythmia Mother    Colon cancer Mother 79   Tremor Mother    Heart disease Mother    Heart attack Father 27   Heart disease Father 15   Arthritis Father        broken back with chronic pain   Glaucoma Father    Breast cancer Sister 63   Glaucoma Sister    GER disease Sister    Colitis Sister    Depression Brother    Heart disease Brother    Arrhythmia Brother    Tremor Brother    Arrhythmia Maternal Grandmother    Glaucoma Maternal Grandmother    Hypertension Daughter    Kidney Stones Daughter    Heart attack Son    Colon polyps Maternal Aunt    Arrhythmia Maternal Uncle    Heart disease Maternal Uncle    Bladder  Cancer Neg Hx    Uterine cancer Neg Hx    Esophageal cancer Neg Hx    Past Surgical History:  Procedure Laterality Date   CATARACT EXTRACTION Bilateral    May 31, 2021 and March 2023   EYE SURGERY  Cataracts 2023   ORIF TIBIA & FIBULA FRACTURES Right 1966   REFRACTIVE SURGERY Left March/April 2018   SALIVARY STONE REMOVAL Right 05/15/2012   Procedure: EXCISION OF RIGHT SUBMANDIBULAR GLAND ;  Surgeon: Ida Loader, MD;  Location: [facility] OR;  Service: ENT;  Laterality: Right;   SALIVARY STONE REMOVAL N/A 11/18/2012   Procedure: INTRAORAL EXCISION SUBMANDIBULAR STONE RIGHT ;  Surgeon: Ida Loader, MD;  Location: [facility];  Service: ENT;  Laterality: N/A;   SKIN  BIOPSY Right 12/2012   legs   TONSILLECTOMY  1949   TUBAL LIGATION  1977   Social History   Tobacco Use   Smoking status: Former    Current packs/day: 0.00    Average packs/day: 0.5 packs/day for 40.0 years (20.0 ttl pk-yrs)    Types: Cigarettes    Start date: 04/15/1981    Quit date: 04/15/2021    Years since quitting: 2.6   Smokeless tobacco: Never   Tobacco comments:    Using nicotine lozenges as needed  Vaping Use   Vaping status: Never Used  Substance Use Topics   Alcohol use: No   Drug use: No   Social History   Social History Narrative   Not on file     Immunization History  Administered Date(s) Administered    sv, Bivalent, Protein Subunit Rsvpref,pf (Abrysvo) 11/11/2021   Fluad Quad(high Dose 65+) 10/20/2019, 10/10/2021   INFLUENZA, HIGH DOSE SEASONAL PF 11/13/2016, 11/13/2017, 10/10/2021, 10/04/2023   Influenza Split 10/23/2010, 10/17/2022   Influenza,inj,Quad PF,6+ Mos 12/11/2012   Influenza-Unspecified 11/05/2013, 11/16/2014, 10/08/2020   Moderna Covid-19 Fall Seasonal Vaccine 28yrs & older 07/26/2022   Moderna Covid-19 Vaccine Bivalent Booster 9yrs & up 10/08/2020   Moderna SARS-COV2 Booster Vaccination 10/08/2020, 07/26/2022   PFIZER(Purple Top)SARS-COV-2 Vaccination 03/01/2019,  03/26/2019, 11/22/2019, 05/21/2020   PNEUMOCOCCAL CONJUGATE-20 03/02/2023   Pfizer Covid-19 Vaccine Bivalent Booster 69yrs & up 10/25/2021, 11/16/2022   Pfizer(Comirnaty)Fall Seasonal Vaccine 12 years and older 11/19/2022   Pneumococcal Conjugate-13 06/23/2010, 05/11/2014   Respiratory Syncytial Virus Vaccine,Recomb Aduvanted(Arexvy) 11/11/2021   Tdap 04/01/2014   Zoster Recombinant(Shingrix) 10/12/2017, 12/14/2017   Zoster, Live 07/24/2006     Objective: Vital Signs: BP (!) 160/85 (BP Location: Right Arm, Patient Position: Sitting, Cuff Size: Small)   Pulse 76   Temp (!) 97.2 F (36.2 C)   Resp 13   Wt 121 lb (54.9 kg)   BMI 19.53 kg/m    Physical Exam Vitals and nursing note reviewed.  HENT:     Head: Normocephalic and atraumatic.     Nose: Nose normal.  Eyes:     Conjunctiva/sclera: Conjunctivae normal.     Pupils: Pupils are equal, round, and reactive to light.  Pulmonary:     Effort: Pulmonary effort is normal. No respiratory distress.  Skin:    General: Skin is warm and dry.  Neurological:     Mental Status: She is alert. Mental status is at baseline.  Psychiatric:        Mood and Affect: Mood normal.        Behavior: Behavior normal.      Musculoskeletal Exam:   CDAI Exam: CDAI Score: -- Patient Global: --; Provider Global: -- Swollen: 0 ; Tender: 1  Joint Exam 11/27/2023      Right  Left  [facility]      Tender     Investigation: No additional findings.  Imaging: No results found.  Recent Labs: Lab Results  Component Value Date   WBC 3.5 (L) 09/05/2023   HGB 13.3 09/05/2023   PLT 220.0 09/05/2023   NA 134 (L) 10/31/2023   K 3.6 10/31/2023   CL 100 10/31/2023   CO2 26 10/31/2023   GLUCOSE 84 10/31/2023   BUN 14 10/31/2023   CREATININE 0.82 10/31/2023   BILITOT 0.8 10/31/2023   ALKPHOS 59 10/31/2023   AST 20 10/31/2023   ALT 9 10/31/2023   PROT 7.4 11/27/2023   ALBUMIN 4.4 10/31/2023   CALCIUM  9.1 10/31/2023   GFRAA 85  11/04/2019     Speciality Comments: No specialty comments available.  Procedures:  No procedures performed Allergies: Medrol  [methylprednisolone ], Alphagan [brimonidine], Carvedilol , Rhopressa [netarsudil dimesylate], Zioptan [tafluprost (pf)], Amlodipine , Lisinopril , Losartan , Penicillins, Spironolactone , Sulfonamide derivatives, Timolol, and Valsartan    Assessment / Plan:     Visit Diagnoses:  Sjogren's syndrome, with unspecified organ involvement  Patient with positive ANA and high positive SSA and SSB w/ symptoms of dry eyes and dry mouth; all of which are consistent with new diagnosis of Sjogren's syndrome.  Discussed with patient that Sjogren's syndrome is a chronic autoimmune disease that is characterized by impaired lacrimal and salivary gland function with resultant dryness of the eyes and mouth. Sjogren's syndrome manifestations can vary per individual, and a variety of other disease manifestations affecting multiple organs systems may occur. Discussed that there is no cure for this disease, but symptoms can be managed with multiple medications.   Also discussed with patient that Sjogren's syndrome increases risk of certain myeloproliferative diseases. Annual monitoring of Protein Electrophoresis, (serum), Kappa/lambda light chains, and IFE Interpretation is recommended, which will be collected today.  Recurrent infections  Given association of immunodeficiencies with autoimmune disease, such as Sjogren's, will check IgG, IgA, IgM today.  Proteinuria Patient with proteinuria on initial work-up. Unclear significance. Sjogren's can affect non-exocrine glands such as the kidneys, most typically presenting as TIN. Given unable to start ACEi due to patient intolerance, will refer to ambulatory referral to Nephrology for further recommendations.     Orders: Orders Placed This Encounter  Procedures   Protein Electrophoresis, (serum)   Kappa/lambda light chains   IgG, IgA, IgM   IFE  Interpretation   Ambulatory referral to Nephrology   No orders of the defined types were placed in this encounter.   I personally spent a total of 30 minutes in the care of the patient today including preparing to see the patient, getting/reviewing separately obtained history, performing a medically appropriate exam/evaluation, counseling and educating, placing orders, referring and communicating with other health care professionals, documenting clinical information in the EHR, independently interpreting results, and communicating results.   Follow-Up Instructions: No follow-ups on file.   Asberry Claw, DO  Note - This record has been created using Animal nutritionist.  Chart creation errors have been sought, but may not always  have been located. Such creation errors do not reflect on  the standard of medical care.

## 2023-11-30 LAB — PROTEIN ELECTROPHORESIS, SERUM
Albumin ELP: 4.2 g/dL (ref 3.8–4.8)
Alpha 1: 0.3 g/dL (ref 0.2–0.3)
Alpha 2: 0.7 g/dL (ref 0.5–0.9)
Beta 2: 0.3 g/dL (ref 0.2–0.5)
Beta Globulin: 0.4 g/dL (ref 0.4–0.6)
Gamma Globulin: 1.5 g/dL (ref 0.8–1.7)
Total Protein: 7.4 g/dL (ref 6.1–8.1)

## 2023-11-30 LAB — IFE INTERPRETATION

## 2023-11-30 LAB — KAPPA/LAMBDA LIGHT CHAINS
Kappa free light chain: 69.7 mg/L — ABNORMAL HIGH (ref 3.3–19.4)
Kappa:Lambda Ratio: 1.48 (ref 0.26–1.65)
Lambda Free Lght Chn: 47.1 mg/L — ABNORMAL HIGH (ref 5.7–26.3)

## 2023-11-30 LAB — IGG, IGA, IGM
IgG (Immunoglobin G), Serum: 1758 mg/dL — ABNORMAL HIGH (ref 600–1540)
IgM, Serum: 67 mg/dL (ref 50–300)
Immunoglobulin A: 216 mg/dL (ref 70–320)

## 2023-12-05 ENCOUNTER — Ambulatory Visit (HOSPITAL_BASED_OUTPATIENT_CLINIC_OR_DEPARTMENT_OTHER): Admitting: Cardiology

## 2023-12-12 ENCOUNTER — Institutional Professional Consult (permissible substitution) (HOSPITAL_BASED_OUTPATIENT_CLINIC_OR_DEPARTMENT_OTHER): Admitting: Cardiovascular Disease

## 2023-12-13 ENCOUNTER — Encounter (HOSPITAL_BASED_OUTPATIENT_CLINIC_OR_DEPARTMENT_OTHER): Admitting: Family

## 2023-12-24 DIAGNOSIS — M3500 Sicca syndrome, unspecified: Secondary | ICD-10-CM | POA: Diagnosis not present

## 2023-12-24 DIAGNOSIS — I129 Hypertensive chronic kidney disease with stage 1 through stage 4 chronic kidney disease, or unspecified chronic kidney disease: Secondary | ICD-10-CM | POA: Diagnosis not present

## 2023-12-24 DIAGNOSIS — R809 Proteinuria, unspecified: Secondary | ICD-10-CM | POA: Diagnosis not present

## 2023-12-28 ENCOUNTER — Encounter: Payer: Self-pay | Admitting: Pharmacist

## 2023-12-28 NOTE — Progress Notes (Signed)
 Pharmacy Quality Measure Review  This patient is appearing on a report for being at risk of failing the adherence measure for hypertension (ACEi/ARB) medications this calendar year.   Medication: valsartan  Last fill date: 09/05/2023 for 90 day supply  Patient experienced swelling and discomfort after starting valsartan . She stopped valsartan  and symptoms resolved.  Valsartan  has been discontinue.  No further follow up needed at this time - valsartan  only filled once.   Madelin Ray, PharmD Clinical Pharmacist [facility]  Population Health [phone]

## 2024-01-06 NOTE — Assessment & Plan Note (Deleted)
 No recent exacerbation

## 2024-01-06 NOTE — Assessment & Plan Note (Deleted)
 Supplement and monitor

## 2024-01-06 NOTE — Assessment & Plan Note (Deleted)
 Well controlled, no changes to meds. Encouraged heart healthy diet such as the DASH diet and exercise as tolerated.

## 2024-01-06 NOTE — Progress Notes (Deleted)
 Subjective:    Patient ID: Suzanne Turner, female    DOB: [birth year], 78 y.o.   MRN: [id number]  No chief complaint on file.   HPI Discussed the use of AI scribe software for clinical note transcription with the patient, who gave verbal consent to proceed.  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   Left eye Dr Camie Albany at [facility]    Osteoarthritis    Osteopenia    Preventative health care 01/30/2016   Salivary duct stone    Skin cancer    Staph infection    after pessary insertion   Urinary incontinence    Vitamin D  deficiency    Wears glasses     Past Surgical History:  Procedure Laterality Date   CATARACT EXTRACTION Bilateral    May 31, 2021 and March 2023   EYE SURGERY  Cataracts 2023   ORIF TIBIA & FIBULA FRACTURES Right 1966   REFRACTIVE SURGERY Left March/April 2018   SALIVARY STONE REMOVAL Right 05/15/2012   Procedure: EXCISION OF RIGHT SUBMANDIBULAR GLAND ;  Surgeon: Ida Loader, MD;  Location: [facility] OR;  Service: ENT;  Laterality: Right;   SALIVARY STONE REMOVAL N/A 11/18/2012   Procedure: INTRAORAL EXCISION SUBMANDIBULAR STONE RIGHT ;  Surgeon: Ida Loader, MD;  Location: [facility];  Service: ENT;  Laterality: N/A;   SKIN BIOPSY Right 12/2012   legs   TONSILLECTOMY  1949   TUBAL LIGATION  1977    Family History  Problem Relation Age of Onset   Arrhythmia  Mother    Colon cancer Mother 77   Tremor Mother    Heart disease Mother    Heart attack Father 64   Heart disease Father 79   Arthritis Father        broken back with chronic pain   Glaucoma Father    Breast cancer Sister 58   Glaucoma Sister    GER disease Sister    Colitis Sister    Depression Brother    Heart disease Brother    Arrhythmia Brother    Tremor Brother    Arrhythmia Maternal Grandmother    Glaucoma Maternal Grandmother    Hypertension Daughter    Kidney Stones Daughter    Heart attack Son    Colon polyps Maternal Aunt    Arrhythmia Maternal Uncle    Heart disease Maternal Uncle    Bladder Cancer Neg Hx    Uterine cancer Neg Hx    Esophageal cancer Neg Hx     Social History   Socioeconomic History   Marital status: Widowed    Spouse name: Not on file   Number of children: 2   Years of education: Not on file   Highest education level: Bachelor's degree (e.g., BA, AB, BS)  Occupational History  Occupation: retired  Tobacco Use   Smoking status: Former    Current packs/day: 0.00    Average packs/day: 0.5 packs/day for 40.0 years (20.0 ttl pk-yrs)    Types: Cigarettes    Start date: 04/15/1981    Quit date: 04/15/2021    Years since quitting: 2.7   Smokeless tobacco: Never   Tobacco comments:    Using nicotine lozenges as needed  Vaping Use   Vaping status: Never Used  Substance and Sexual Activity   Alcohol use: No   Drug use: No   Sexual activity: Not Currently    Comment: lives alone, widowed, retired from print production planner, education. and real estate, no dietary restrictions  Other Topics Concern   Not on file  Social History Narrative   Not on file   Social Drivers of Health   Tobacco Use: Medium Risk (11/29/2023)   Patient History    Smoking Tobacco Use: Former    Smokeless Tobacco Use: Never    Passive Exposure: Not on file  Financial Resource Strain: Medium Risk (12/31/2023)   Overall  Financial Resource Strain (CARDIA)    Difficulty of Paying Living Expenses: Somewhat hard  Food Insecurity: Patient Declined (12/31/2023)   Epic    Worried About Programme Researcher, Broadcasting/film/video in the Last Year: Patient declined    Barista in the Last Year: Patient declined  Transportation Needs: No Transportation Needs (12/31/2023)   Epic    Lack of Transportation (Medical): No    Lack of Transportation (Non-Medical): No  Physical Activity: Insufficiently Active (12/31/2023)   Exercise Vital Sign    Days of Exercise per Week: 2 days    Minutes of Exercise per Session: 40 min  Stress: No Stress Concern Present (12/31/2023)   [employer] of Occupational Health - Occupational Stress Questionnaire    Feeling of Stress: Only a little  Social Connections: Socially Isolated (12/31/2023)   Social Connection and Isolation Panel    Frequency of Communication with Friends and Family: More than three times a week    Frequency of Social Gatherings with Friends and Family: Three times a week    Attends Religious Services: Never    Active Member of Clubs or Organizations: No    Attends Banker Meetings: Not on file    Marital Status: Widowed  Intimate Partner Violence: Not At Risk (07/26/2023)   Epic    Fear of Current or Ex-Partner: No    Emotionally Abused: No    Physically Abused: No    Sexually Abused: No  Depression (PHQ2-9): Medium Risk (10/04/2023)   Depression (PHQ2-9)    PHQ-2 Score: 7  Alcohol Screen: Low Risk (07/26/2023)   Alcohol Screen    Last Alcohol Screening Score (AUDIT): 0  Housing: Unknown (12/31/2023)   Epic    Unable to Pay for Housing in the Last Year: No    Number of Times Moved in the Last Year: Not on file    Homeless in the Last Year: No  Utilities: Not At Risk (07/26/2023)   Epic    Threatened with loss of utilities: No  Health Literacy: Adequate Health Literacy (07/26/2023)   B1300 Health Literacy    Frequency of need for help  with medical instructions: Never    Outpatient Medications Prior to Visit  Medication Sig Dispense Refill   acetaminophen  (TYLENOL ) 325 MG tablet Take 325 mg by mouth daily as needed for pain.     aspirin  EC 81 MG tablet Take 81 mg  by mouth daily.     Cholecalciferol (VITAMIN D ) 50 MCG (2000 UT) tablet Take 2,000 Units by mouth daily.     cloNIDine  (CATAPRES ) 0.1 MG tablet Take 1 tablet (0.1 mg total) by mouth 3 (three) times daily. Note: Start with 0.5 tablet (0.05 mg) twice daily. If blood pressure remains above 140/90, may increase to 1 full tablet (0.1 mg) twice daily. 90 tablet 1   Multiple Vitamins-Minerals (HAIR/SKIN/NAILS/BIOTIN PO) Take 500 mg by mouth daily.     VYZULTA 0.024 % SOLN Place 1 drop into both eyes at bedtime.     No facility-administered medications prior to visit.    Allergies[1]  Review of Systems  Constitutional:  Negative for fever and malaise/fatigue.  HENT:  Negative for congestion.   Eyes:  Negative for blurred vision.  Respiratory:  Negative for shortness of breath.   Cardiovascular:  Negative for chest pain, palpitations and leg swelling.  Gastrointestinal:  Negative for abdominal pain, blood in stool and nausea.  Genitourinary:  Positive for frequency and urgency. Negative for dysuria.  Musculoskeletal:  Negative for falls.  Skin:  Negative for rash.  Neurological:  Negative for dizziness, loss of consciousness and headaches.  Endo/Heme/Allergies:  Negative for environmental allergies.  Psychiatric/Behavioral:  Negative for depression. The patient is not nervous/anxious.        Objective:    Physical Exam Constitutional:      General: She is not in acute distress.    Appearance: Normal appearance. She is well-developed. She is not toxic-appearing.  HENT:     Head: Normocephalic and atraumatic.     Right Ear: External ear normal.     Left Ear: External ear normal.     Nose: Nose normal.  Eyes:     General:        Right eye: No  discharge.        Left eye: No discharge.     Conjunctiva/sclera: Conjunctivae normal.  Neck:     Thyroid : No thyromegaly.  Cardiovascular:     Rate and Rhythm: Normal rate and regular rhythm.     Heart sounds: Normal heart sounds. No murmur heard. Pulmonary:     Effort: Pulmonary effort is normal. No respiratory distress.     Breath sounds: Normal breath sounds.  Abdominal:     General: Bowel sounds are normal.     Palpations: Abdomen is soft.     Tenderness: There is no abdominal tenderness. There is no guarding.  Musculoskeletal:        General: Normal range of motion.     Cervical back: Neck supple.  Lymphadenopathy:     Cervical: No cervical adenopathy.  Skin:    General: Skin is warm and dry.  Neurological:     Mental Status: She is alert and oriented to person, place, and time.  Psychiatric:        Mood and Affect: Mood normal.        Behavior: Behavior normal.        Thought Content: Thought content normal.        Judgment: Judgment normal.    There were no vitals taken for this visit. Wt Readings from Last 3 Encounters:  11/27/23 121 lb (54.9 kg)  11/22/23 120 lb 12.8 oz (54.8 kg)  10/31/23 122 lb 9.6 oz (55.6 kg)    Diabetic Foot Exam - Simple   No data filed    Lab Results  Component Value Date   WBC 3.5 (L) 09/05/2023   HGB  13.3 09/05/2023   HCT 38.6 09/05/2023   PLT 220.0 09/05/2023   GLUCOSE 84 10/31/2023   CHOL 201 (H) 09/05/2023   TRIG 116.0 09/05/2023   HDL 67.80 09/05/2023   LDLCALC 110 (H) 09/05/2023   ALT 9 10/31/2023   AST 20 10/31/2023   NA 134 (L) 10/31/2023   K 3.6 10/31/2023   CL 100 10/31/2023   CREATININE 0.82 10/31/2023   BUN 14 10/31/2023   CO2 26 10/31/2023   TSH 2.76 09/05/2023   INR 1.0 03/22/2020   HGBA1C 5.8 09/05/2023    Lab Results  Component Value Date   TSH 2.76 09/05/2023   Lab Results  Component Value Date   WBC 3.5 (L) 09/05/2023   HGB 13.3 09/05/2023   HCT 38.6 09/05/2023   MCV 90.8 09/05/2023   PLT  220.0 09/05/2023   Lab Results  Component Value Date   NA 134 (L) 10/31/2023   K 3.6 10/31/2023   CO2 26 10/31/2023   GLUCOSE 84 10/31/2023   BUN 14 10/31/2023   CREATININE 0.82 10/31/2023   BILITOT 0.8 10/31/2023   ALKPHOS 59 10/31/2023   AST 20 10/31/2023   ALT 9 10/31/2023   PROT 7.4 11/27/2023   ALBUMIN 4.4 10/31/2023   CALCIUM  9.1 10/31/2023   ANIONGAP 11 04/17/2021   GFR 68.48 10/31/2023   Lab Results  Component Value Date   CHOL 201 (H) 09/05/2023   Lab Results  Component Value Date   HDL 67.80 09/05/2023   Lab Results  Component Value Date   LDLCALC 110 (H) 09/05/2023   Lab Results  Component Value Date   TRIG 116.0 09/05/2023   Lab Results  Component Value Date   CHOLHDL 3 09/05/2023   Lab Results  Component Value Date   HGBA1C 5.8 09/05/2023       Assessment & Plan:  Osteoporosis, unspecified osteoporosis type, unspecified pathological fracture presence Assessment & Plan: Encouraged to get adequate exercise, calcium  and vitamin d  intake    Low serum vitamin D  Assessment & Plan: Supplement and monitor    Hyponatremia Assessment & Plan: Mild, asymptomatic, will monitor   Mixed hyperlipidemia Assessment & Plan: Encourage heart healthy diet such as MIND or DASH diet, increase exercise, avoid trans fats, simple carbohydrates and processed foods, consider a krill or fish or flaxseed oil cap daily.    Essential hypertension Assessment & Plan: Well controlled, no changes to meds. Encouraged heart healthy diet such as the DASH diet and exercise as tolerated.    Chronic obstructive pulmonary disease, unspecified COPD type (HCC) Assessment & Plan: No recent exacerbation     Assessment and Plan Assessment & Plan      Suzanne Horton, MD     [1] Allergies Allergen Reactions   Medrol  [Methylprednisolone ] Swelling and Other (See Comments)    Facial and lip swelling, but no breathing issues   Alphagan [Brimonidine] Other (See  Comments)    Eye irritation / eye fatigue   Carvedilol  Diarrhea   Rhopressa [Netarsudil Dimesylate] Itching   Zioptan [Tafluprost (Pf)] Other (See Comments)    Eye irritation   Amlodipine  Other (See Comments)    Hip,legs,and feet feels achy and stiff   Lisinopril  Rash   Losartan  Anxiety   Penicillins Rash   Spironolactone  Other (See Comments)    Muscle cramps   Sulfonamide Derivatives Rash   Timolol Rash   Valsartan      Pt reports intolerance, rash

## 2024-01-06 NOTE — Assessment & Plan Note (Deleted)
Mild, asymptomatic, will monitor

## 2024-01-06 NOTE — Assessment & Plan Note (Deleted)
 Encouraged to get adequate exercise, calcium and vitamin d intake

## 2024-01-06 NOTE — Assessment & Plan Note (Deleted)
 Encourage heart healthy diet such as MIND or DASH diet, increase exercise, avoid trans fats, simple carbohydrates and processed foods, consider a krill or fish or flaxseed oil cap daily.

## 2024-01-07 ENCOUNTER — Ambulatory Visit: Admitting: Family Medicine

## 2024-01-22 ENCOUNTER — Ambulatory Visit: Admitting: Student

## 2024-01-27 ENCOUNTER — Encounter: Payer: Self-pay | Admitting: Pharmacist

## 2024-02-04 ENCOUNTER — Other Ambulatory Visit

## 2024-02-18 ENCOUNTER — Ambulatory Visit: Admitting: Family Medicine

## 2024-03-12 ENCOUNTER — Encounter: Admitting: Physician Assistant

## 2024-05-22 ENCOUNTER — Ambulatory Visit

## 2024-07-30 ENCOUNTER — Ambulatory Visit

## 2025-02-24 ENCOUNTER — Ambulatory Visit: Admitting: Student
